# Patient Record
Sex: Male | Born: 1970 | Race: White | Hispanic: No | Smoking: Current every day smoker
Health system: Southern US, Community
[De-identification: ages and names within clinical notes are randomized; demographics above are authoritative.]

## PROBLEM LIST (undated history)

## (undated) DIAGNOSIS — I219 Acute myocardial infarction, unspecified: Secondary | ICD-10-CM

## (undated) DIAGNOSIS — I639 Cerebral infarction, unspecified: Secondary | ICD-10-CM

## (undated) DIAGNOSIS — I2510 Atherosclerotic heart disease of native coronary artery without angina pectoris: Secondary | ICD-10-CM

## (undated) DIAGNOSIS — Z91199 Patient's noncompliance with other medical treatment and regimen due to unspecified reason: Secondary | ICD-10-CM

## (undated) DIAGNOSIS — I6529 Occlusion and stenosis of unspecified carotid artery: Secondary | ICD-10-CM

## (undated) DIAGNOSIS — T884XXA Failed or difficult intubation, initial encounter: Secondary | ICD-10-CM

## (undated) DIAGNOSIS — I10 Essential (primary) hypertension: Secondary | ICD-10-CM

## (undated) DIAGNOSIS — E785 Hyperlipidemia, unspecified: Secondary | ICD-10-CM

## (undated) DIAGNOSIS — Z720 Tobacco use: Secondary | ICD-10-CM

## (undated) DIAGNOSIS — Z9119 Patient's noncompliance with other medical treatment and regimen: Secondary | ICD-10-CM

## (undated) HISTORY — DX: Essential (primary) hypertension: I10

## (undated) HISTORY — DX: Patient's noncompliance with other medical treatment and regimen: Z91.19

## (undated) HISTORY — DX: Tobacco use: Z72.0

## (undated) HISTORY — DX: Acute myocardial infarction, unspecified: I21.9

## (undated) HISTORY — DX: Atherosclerotic heart disease of native coronary artery without angina pectoris: I25.10

## (undated) HISTORY — DX: Patient's noncompliance with other medical treatment and regimen due to unspecified reason: Z91.199

## (undated) HISTORY — DX: Hyperlipidemia, unspecified: E78.5

## (undated) HISTORY — PX: CORONARY ANGIOPLASTY WITH STENT PLACEMENT: SHX49

## (undated) HISTORY — DX: Failed or difficult intubation, initial encounter: T88.4XXA

## (undated) HISTORY — DX: Cerebral infarction, unspecified: I63.9

## (undated) HISTORY — DX: Occlusion and stenosis of unspecified carotid artery: I65.29

---

## 2002-01-31 ENCOUNTER — Emergency Department (HOSPITAL_COMMUNITY): Admission: EM | Admit: 2002-01-31 | Discharge: 2002-01-31 | Payer: Self-pay | Admitting: Emergency Medicine

## 2002-03-03 ENCOUNTER — Emergency Department (HOSPITAL_COMMUNITY): Admission: EM | Admit: 2002-03-03 | Discharge: 2002-03-03 | Payer: Self-pay | Admitting: Emergency Medicine

## 2003-03-03 ENCOUNTER — Emergency Department (HOSPITAL_COMMUNITY): Admission: EM | Admit: 2003-03-03 | Discharge: 2003-03-03 | Payer: Self-pay | Admitting: *Deleted

## 2003-11-02 ENCOUNTER — Emergency Department (HOSPITAL_COMMUNITY): Admission: EM | Admit: 2003-11-02 | Discharge: 2003-11-02 | Payer: Self-pay | Admitting: Emergency Medicine

## 2004-12-05 ENCOUNTER — Emergency Department (HOSPITAL_COMMUNITY): Admission: EM | Admit: 2004-12-05 | Discharge: 2004-12-05 | Payer: Self-pay | Admitting: *Deleted

## 2005-01-20 ENCOUNTER — Encounter: Payer: Self-pay | Admitting: *Deleted

## 2005-01-20 ENCOUNTER — Inpatient Hospital Stay (HOSPITAL_COMMUNITY): Admission: EM | Admit: 2005-01-20 | Discharge: 2005-01-22 | Payer: Self-pay | Admitting: Emergency Medicine

## 2005-04-22 ENCOUNTER — Emergency Department (HOSPITAL_COMMUNITY): Admission: EM | Admit: 2005-04-22 | Discharge: 2005-04-23 | Payer: Self-pay | Admitting: *Deleted

## 2005-08-16 ENCOUNTER — Encounter: Payer: Self-pay | Admitting: Emergency Medicine

## 2005-08-16 ENCOUNTER — Inpatient Hospital Stay (HOSPITAL_COMMUNITY): Admission: EM | Admit: 2005-08-16 | Discharge: 2005-08-17 | Payer: Self-pay | Admitting: Emergency Medicine

## 2005-09-07 ENCOUNTER — Emergency Department (HOSPITAL_COMMUNITY): Admission: EM | Admit: 2005-09-07 | Discharge: 2005-09-07 | Payer: Self-pay | Admitting: Emergency Medicine

## 2005-10-15 ENCOUNTER — Emergency Department (HOSPITAL_COMMUNITY): Admission: EM | Admit: 2005-10-15 | Discharge: 2005-10-15 | Payer: Self-pay | Admitting: Emergency Medicine

## 2005-12-20 ENCOUNTER — Emergency Department (HOSPITAL_COMMUNITY): Admission: EM | Admit: 2005-12-20 | Discharge: 2005-12-20 | Payer: Self-pay | Admitting: Emergency Medicine

## 2006-04-02 ENCOUNTER — Encounter: Payer: Self-pay | Admitting: Emergency Medicine

## 2006-04-02 ENCOUNTER — Inpatient Hospital Stay (HOSPITAL_COMMUNITY): Admission: EM | Admit: 2006-04-02 | Discharge: 2006-04-04 | Payer: Self-pay | Admitting: Cardiology

## 2008-01-06 ENCOUNTER — Emergency Department (HOSPITAL_COMMUNITY): Admission: EM | Admit: 2008-01-06 | Discharge: 2008-01-06 | Payer: Self-pay | Admitting: Emergency Medicine

## 2010-02-24 ENCOUNTER — Inpatient Hospital Stay (HOSPITAL_COMMUNITY): Admission: EM | Admit: 2010-02-24 | Discharge: 2010-02-25 | Payer: Self-pay | Admitting: Cardiovascular Disease

## 2010-02-24 ENCOUNTER — Encounter: Payer: Self-pay | Admitting: Emergency Medicine

## 2010-03-11 ENCOUNTER — Encounter (HOSPITAL_COMMUNITY): Admission: RE | Admit: 2010-03-11 | Discharge: 2010-03-11 | Payer: Self-pay | Admitting: Cardiology

## 2011-01-10 LAB — CBC
HCT: 48.3 % (ref 39.0–52.0)
HCT: 43.1 % (ref 39.0–52.0)
Hemoglobin: 17.3 g/dL — ABNORMAL HIGH (ref 13.0–17.0)
Hemoglobin: 15 g/dL (ref 13.0–17.0)
MCHC: 34.8 g/dL (ref 30.0–36.0)
Platelets: 165 10*3/uL (ref 150–400)
Platelets: 130 10*3/uL — ABNORMAL LOW (ref 150–400)
RBC: 5.3 MIL/uL (ref 4.22–5.81)
RDW: 13.4 % (ref 11.5–15.5)
RDW: 13.4 % (ref 11.5–15.5)
WBC: 10.6 10*3/uL — ABNORMAL HIGH (ref 4.0–10.5)
WBC: 7.9 10*3/uL (ref 4.0–10.5)

## 2011-01-10 LAB — HEPATIC FUNCTION PANEL: Total Protein: 6.2 g/dL (ref 6.0–8.3)

## 2011-01-10 LAB — CARDIAC PANEL(CRET KIN+CKTOT+MB+TROPI)
CK, MB: 1.2 ng/mL (ref 0.3–4.0)
CK, MB: 1.3 ng/mL (ref 0.3–4.0)
CK, MB: 1.6 ng/mL (ref 0.3–4.0)
Relative Index: INVALID (ref 0.0–2.5)
Relative Index: INVALID (ref 0.0–2.5)
Total CK: 84 U/L (ref 7–232)
Total CK: 95 U/L (ref 7–232)
Troponin I: 0.13 ng/mL — ABNORMAL HIGH (ref 0.00–0.06)

## 2011-01-10 LAB — BASIC METABOLIC PANEL
BUN: 9 mg/dL (ref 6–23)
CO2: 25 mEq/L (ref 19–32)
CO2: 24 mEq/L (ref 19–32)
Calcium: 9.8 mg/dL (ref 8.4–10.5)
Calcium: 8.8 mg/dL (ref 8.4–10.5)
Chloride: 110 mEq/L (ref 96–112)
Creatinine, Ser: 0.96 mg/dL (ref 0.4–1.5)
GFR calc Af Amer: >60 mL/min (ref >60)
GFR calc Af Amer: >60 mL/min (ref >60)
GFR calc non Af Amer: >60 mL/min (ref >60)
GFR calc non Af Amer: >60 mL/min (ref >60)
Glucose, Bld: 92 mg/dL (ref 70–99)
Potassium: 4.1 mEq/L (ref 3.5–5.1)
Sodium: 140 mEq/L (ref 135–145)

## 2011-01-10 LAB — LIPID PANEL
HDL: 25 mg/dL — ABNORMAL LOW (ref >39)
VLDL: 44 mg/dL — ABNORMAL HIGH (ref 0–40)

## 2011-01-10 LAB — HEMOGLOBIN A1C
Hgb A1c MFr Bld: 5.3 % (ref <5.7)
Mean Plasma Glucose: 105 mg/dL (ref <117)

## 2011-01-10 LAB — PROTIME-INR: Prothrombin Time: 13.9 seconds (ref 11.6–15.2)

## 2011-01-10 LAB — POCT CARDIAC MARKERS
Myoglobin, poc: 62.3 ng/mL (ref 12–200)
Troponin i, poc: 0.09 ng/mL (ref 0.00–0.09)
Troponin i, poc: 0.09 ng/mL (ref 0.00–0.09)

## 2011-01-10 LAB — DIFFERENTIAL
Basophils Absolute: 0.2 10*3/uL — ABNORMAL HIGH (ref 0.0–0.1)
Basophils Relative: 2 % — ABNORMAL HIGH (ref 0–1)
Eosinophils Absolute: 0.3 10*3/uL (ref 0.0–0.7)
Eosinophils Relative: 3 % (ref 0–5)
Monocytes Absolute: 0.7 10*3/uL (ref 0.1–1.0)
Neutro Abs: 4.6 10*3/uL (ref 1.7–7.7)

## 2011-01-10 LAB — MRSA PCR SCREENING: MRSA by PCR: NEGATIVE

## 2011-01-10 LAB — APTT: aPTT: 44 seconds — ABNORMAL HIGH (ref 24–37)

## 2011-01-10 LAB — TSH: TSH: 2.237 u[IU]/mL (ref 0.350–4.500)

## 2011-03-10 NOTE — Discharge Summary (Signed)
Brandon [name], Brandon [name]               ACCOUNT NO.:  [id number]   MEDICAL RECORD NO.:  [id number]          PATIENT TYPE:  INP   LOCATION:  6531                         FACILITY:  [facility]   PHYSICIAN:  Cristy Hilts. Brandon Halim, MD       DATE OF BIRTH:  07-Sep-1971   DATE OF ADMISSION:  04/02/2006  DATE OF DISCHARGE:  04/04/2006                                 DISCHARGE SUMMARY   DISCHARGE DIAGNOSES:  1.  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  2.  Right coronary artery intervention with stenting in March of 2006.  3.  Dyslipidemia.  4.  History of smoking.   HOSPITAL COURSE:  The patient is a 40 year old male followed by Dr. Jacinto [name]  with a history of coronary disease.  He had an inferior MI in March of 2006  treated with RCA stenting.  In October of  2006, he was restudied when he  presented with recurrent chest pain; he had run out of his Plavix.  Catheterization at that time revealed no restenosis.  He was admitted via  [name] on April 02, 2006 after he presented there with chest pain  consistent with unstable angina.  The patient unfortunately continues to  smoke a pack and a half of cigarettes a day.  I am not sure he has been  compliant with his medicines, as he requested prescriptions for all of his  medicines at discharge, but says he was taking them at home.  The patient  was admitted and started on IV heparin and nitrates and set up for  diagnostic catheterization.  This was done on April 03, 2006.  He had a 75%  in-stent restenosis to the RCA that was treated with a cutting balloon.  There was 40 to 50% mid LAD, 50% diagonal and 80% distal circumflex that was  a small vessel.  LV function was normal.  The patient's troponins were  negative.  He tolerated the procedure well.  During the early morning hours  of 5:00 a.m. on the April 04, 2006, he had a 2.7 second pause while on  telemetry.  He denies any syncopal  history or dizziness.  We feel it he can  be discharged later on April 04, 2006.  Will continue his current  medications.  We did add Zetia 10 mg, as his LDL was 126.   DISCHARGE MEDICATIONS:  1.  Lipitor 80 mg a day.  2.  Zetia 10 mg a day.  3.  Plavix 75 mg a day.  4.  Coated aspirin daily.  5.  Lisinopril 10 mg a day.  6.  Imdur 30 mg a day.  7.  Metoprolol 25 mg twice a day.  8.  Nitroglycerin sublingual p.r.n.   LABORATORY DATA:  White count 9.4, hemoglobin 15.5, hematocrit 43.8,  platelets 180.  Sodium 140, potassium 4.1, BUN 12, creatinine 1.0.  LFTs  were normal.  His troponins were negative.  BNP is less than 30.  INR is  1.0.   CHEST X-RAY:  Stable with a prominent right heart border.   ELECTROCARDIOGRAM:  Sinus rhythm and nonspecific ST changes.  He does have  small inferior Qs.   DISPOSITION:  The patient is discharged in stable condition.  He will be  able to return to work on Monday.  I gave him new prescriptions for all his  medications.  Will continue him on his current dose of beta blocker for now.      Abelino Derrick, P.A.      Cristy Hilts. Brandon Halim, MD  Electronically Signed    LKK/MEDQ  D:  04/04/2006  T:  04/04/2006  Job:  [id number]

## 2011-03-10 NOTE — Cardiovascular Report (Signed)
NAMEPARMVIR, BOOMER NO.:  [id number]   MEDICAL RECORD NO.:  [id number]          PATIENT TYPE:  OBV   LOCATION:  2807                         FACILITY:  [facility]   PHYSICIAN:  Nicki Guadalajara, M.D.     DATE OF BIRTH:  1970-11-02   DATE OF PROCEDURE:  DATE OF DISCHARGE:                              CARDIAC CATHETERIZATION   Cardiac catheterization and percutaneous coronary intervention.   INDICATIONS:  [name] is a 40 year old male, with known coronary  artery disease.  In March 2006, he presented with unstable angina and non-ST-  segment elevation MI.  Catheterization revealed high-grade stenoses in his  mid right coronary artery, for which he underwent stenting with a 3.0 x 33-  mm drug-eluting Cypher stent.  He also did have concomitant CAD in the  distal circumflex as well as scattered 43% lesions in his LAD.  He initially  did well.  In October 2006, repeat catheterization was performed, which  showed a widely patent stent.  At that time, he was again noted to have a  90% stenosis in the distal aspect of a small PLA branch arising from the  circumflex vessel not felt to be amenable to intervention due to small  caliber.  His RCA stent was widely patent.  He again had no significant  change in his LAD lesions.  Apparently, the patient had been doing well.  He  does have a history of continued tobacco use.  He presented to [facility] in transfer from [facility] on April 02, 2006, after  experiencing increasing recurrent symptomatology of similar chest pain that  he had experienced in March 2006.  CK enzymes were negative.  Definitive  catheterization was recommended.   PROCEDURE:  After premedication with Versed 2 mg intravenous, the patient  was prepped and draped in the usual fashion.  His right femoral artery was  punctured anteriorly and a 5-French sheath was inserted.  Diagnostic  catheterization was done utilizing a 5-French  Judkins 4 left and right  coronary catheters.  IC nitroglycerin was administered down the RCA due to  proximal spasm and also to make certain that there was no significant spasm  associated with the lesion.  The pigtail catheter was used for biplane  cinearteriography .  At this time, I broke scrub and reviewed  cineangiograms.  It became apparent that there was no change in the  previously noted 80% circumflex stenosis and the LAD lesions did not appear  to be significantly changed.  However, he did appear to have an eccentric  narrowing within the distal third of the long stented segment in the mid  RCA.  After discussion with the patient, the decision was made to attempt  percutaneous coronary intervention.  Sheath was upgraded to a 6-French  system.  Additional 1 mg Versed was administered.  Angiomax was used for  anticoagulation.  The patient had been on Plavix and received an initial 150  mg orally during this procedure.  A 6-French FR-4 guide with side holes was  used for the interventional procedure.  A __________  wire was advanced down  the RCA.  ACT was therapeutic.  A 3.25 x 20 mm cutting balloon catheter was  used and multiple cuts and dilatations were made within the, tented segment.  The patient received additional IC nitroglycerin.  Cineangiography confirmed  an excellent angiographic result within the entire stented segment of the  RCA.  The patient felt well.  With balloon inflation, he did experience his  chest pressure which responded to balloon deflation.  He tolerated the  procedure well.   HEMODYNAMIC DATA:  Central aortic pressure was 105/73, left ventricular  pressure was 105/13.   ANGIOGRAPHIC DATA:  Left main coronary artery was angiographically normal  and bifurcated into LAD and left circumflex system.   The LAD gave rise to proximal diagonal vessel that has 40% proximal 50% mid  narrowing.  The LAD after the diagonal vessel had 40% proximal stenosis and   40-50% mid stenoses.   The circumflex vessel had smooth 20% narrowing proximally.  The distal  circumflex gave rise to a small posterolateral branch and at the beginning  of this posterolateral branch, there was diffuse narrowing of 80%.  This was  not significantly changed from previously  and the caliber of vessel was  approximately 1.5 mm.   The right coronary was a moderate size vessel that had a large, long, 3.0 x  33-mm Cypher stent inserted proximally extending to the mid segment before  the crux.  There was some mild spasm proximal to the stented segment which  responded to IC nitroglycerin.  There was an eccentric 70-75% stenosis in  the distal third of the stent in the region of a small anterior RV marginal  takeoff.  The ostium of the anterior RV marginal vessel was jailed and had  80-90% narrowing.  The RCA beyond the stented segment gave rise to an acute  marginal branch, PDA-like vessel that 30-40% narrowing.  There was mild  luminal irregularity of the RCA beyond the crux and then this vessel ended  into two branches to PDA and posterolateral type of branch.   Biplane selective angiography revealed normal LV contractility without focal  segmental wall motion abnormality.   Following cutting balloon arthrotomy within the stented segment in the right  coronary artery, the entire stented segment was reduced to 0%.  There was  TIMI III flow.  There was no evidence for dissection.   IMPRESSION:  1.  Normal left ventricular function.  2.  Multivessel coronary obstructive disease with 40 and 50% lesions in the      first diagonal branch of the left anterior descending, 40 to 50% lesions      in the proximal left anterior descending beyond the diagonal and mid-      left anterior descending segment;  20% narrowing in the proximal      circumflex with old 80% distal circumflex stenosis prior to a very small     PLA takeoff in a 1.5 mm caliber vessel; and  focal 75% in stent       restenosis in the distal third aspect of the previously placed 3.0 x 33-      mm Cypher stent with 80-90% narrowing in a jailed ostial of small      anterior RV marginal branch and luminal irregularity in the distal right      coronary artery with 30-40% narrowing in the acute marginal PDA-like      vessel.  3.  Successful cutting balloon arthrotomy within the right coronary  artery      stent with the 75% stenosis being reduced to 0%.  4.  Angiomax for anticoagulation with oral Plavix.           ______________________________  Nicki Guadalajara, M.D.     TK/MEDQ  D:  04/03/2006  T:  04/03/2006  Job:  [id number]   cc:   Sherren Kerns, M.D.

## 2011-03-10 NOTE — Cardiovascular Report (Signed)
[name], Brandon               ACCOUNT NO.:  [id number]   MEDICAL RECORD NO.:  [id number]          PATIENT TYPE:  INP   LOCATION:  2907                         FACILITY:  [facility]   PHYSICIAN:  Cristy Hilts. Jacinto Halim, MD       DATE OF BIRTH:  July 09, 1971   DATE OF PROCEDURE:  01/20/2005  DATE OF DISCHARGE:                              CARDIAC CATHETERIZATION   PROCEDURE PERFORMED:  1.  Left ventriculography.  2.  Selective right and left coronary arteriography.  3.  Ascending aortogram.  4.  Right percutaneous transluminal coronary angioplasty and stenting of      right coronary artery.  5.  Right femoral angiography and closure of right femoral artery access      with AngioSeal.   INDICATION:  Brandon [name] is a 40 year old Caucasian male with no  similar prior cardiac history, strong family history of premature coronary  artery disease, morbid obesity and smoking.  He has been having recurrent  chest discomfort over the last 2 weeks.  He presented with unstable angina  to the emergency room and had an abnormal EKG in the form of T-wave  inversions in the inferior leads and also lateral leads.  Given this, he was  brought to the cardiac catheterization lab to evaluate his coronary anatomy.   HEMODYNAMIC DATA:  The left ventricular pressures were 111/28, with an end-  diastolic pressure of 26 mmHg. The aortic pressure of 105/74 with a mean of  89 mmHg. There was no pressure gradient across the aortic valve.   ANGIOGRAPHIC DATA:  LEFT VENTRICLE:  The left ventricular systolic function  was normal. Ejection fraction was estimated at 60%. There was no significant  mitral regurgitation.  RIGHT CORONARY ARTERY:  The right coronary artery is a large dominant  vessel. It has severe ratty midsegment 70-90% stenosis.  This stenosis is a  very long segment.  The PLA and PDA has high bifurcation.  The PLA has a 30-  40% luminal irregularity.  The RCA has a high superior takeoff.  LEFT MAIN:   The left main is a large caliber vessel.  It is normal.  CIRCUMFLEX:  The circumflex is a moderate-to-large caliber vessel.  However,  it has diffuse disease in its distal segment.  The lesion is at least about  70-80%.  RAMUS INTERMEDIATE:  Ramus intermediate is a large caliber vessel.  It has  mild luminal irregularity.  LEFT ANTERIOR DESCENDING:  Left anterior descending vessel is a large  caliber vessel.  Gives rise to a small diagonal one.  Midsegment has a 30%  or at most 40% stenosis, and mid-to-distal segment also has a 40-50%  stenosis.  There was kinking noted in the midsegment.  The LAD wraps around  the apex.   ASCENDING AORTOGRAM:  Ascending aortogram reveals presence of aortic valve  cusps.  The origin of RCA was superior.   IMPRESSION:  1.  Ratty 70-90% long segment right coronary artery stenosis in the mid      segment with slow filling noted in the right coronary artery.  Circumflex has diffuse distal disease and a small vessel distally.      Proximally it is a large vessel.  Distally there is 70-80% luminal      irregularity.  2.  Ramus intermedius is a large caliber vessel with mild luminal      irregularity.  Left anterior descending is a large-caliber vessel and      has a mid-40% and a mid-to-distal 40-50% stenosis.   INTERVENTION DATA:  Successful PTCA and stenting of the  mid-RCA with a 2.0  x 33 mm CYPHER deployed at 18 atmospheres of pressure  The stenosis was  reduced from 90% to 0% with TIMI 3 flow maintained at the end of the  procedure.  There was brisk flow noted at the end of the procedure.   RECOMMENDATIONS:  1.  The patient will be observed for 24-48 hours.  A non-Q wave myocardial      infarction will be ruled out by serial enzymes.  2.  A very aggressive risk factor modification is indicated especially given      his strong family history, smoking cessation, exercise with weight loss,      HDL goal greater than 40, LDL goal less than 70 is  indicated.   A total of 250 mL of contrast was utilized for diagnostic and interventional  procedure.   TECHNIQUES OF PROCEDURE:  Under the usual sterile precautions, using an 6-  French right femoral artery access, a 6-French multipurpose B2catheter was  advanced into the ascending aorta over a 0.035 J-wire.  The catheter was  then advanced into the left ventricle and left ventricular pressures were  monitored.  Hand contrast injection of the left ventricle was performed both  in LAO and RAO positions.  The catheter was flushed with saline and pulled  back into the ascending aorta and pressure gradient across the aortic valve  was monitored.  The right coronary artery was selectively engaged and  angiography was performed.  Prior to doing this, an ascending aortogram was  performed in the LAO projection.  Then the 200 mcg of intracoronary  nitroglycerin was also administered and angiography was repeated.  Then the  catheter was utilized to engage the left main coronary artery and  angiography was repeated.  Then the catheter was pulled out of the body in  the usual fashion.  Then a 6-French Judkins left 4.0 diagnostic catheter was  utilized again to engage the left main coronary artery and angiography was  repeated.  Then the catheter was pulled out of the body in the usual  fashion.   TECHNIQUE OF INTERVENTION:  A 6 French sheath was exchanged for a 7 [place]  sheath.  Then a 7 [place] AR-1 with side-hole guide was utilized to engage  the right coronary artery.  Then 200 mcg of intracoronary nitroglycerin was  readministered.  A 190-cm by 0.014-inch ATW guidewire was utilized to cross  into the RCA and the lesion length was carefully measured.  Prior to this  procedure a total of 6000 units of heparin was administered and the ACT was  maintained at therapeutic range.  A 3.0 x 30 mm Voyager balloon was utilized and balloon angioplasty at 12 atmospheres of pressure was performed.   Because of persistent 50% stenosis in its midsegment and slow filling, a 3.0  x 33 mm CYPHER stent was utilized and the stent was deployed at 18  atmospheres of pressure for a total of 42 seconds.  The balloon was  deflated, pulled back into the guiding catheter; 200 mcg of intracoronary  nitroglycerin was administered.  Angiography was repeated.  Excellent  results were noted.  The guidewire was withdrawn, angiography was repeated.  The guide catheter disengaged and pulled out of body in the usual fashion.  A right femoral angiography was performed through the arterial access sheath  and the access was closed with 8 French AngioSeal.  Excellent hemostasis was  obtained.  The patient tolerated the procedure well.  No immediate  complications noted.      JRG/MEDQ  D:  01/20/2005  T:  01/20/2005  Job:  [id number]   cc:   Kirk Ruths, M.D.  [address]  [place]  [place] [address]  Fax: [phone]   Cristy Hilts. Jacinto Halim, MD  [address]  [place]  [place] [address]  Fax: [phone]

## 2011-03-10 NOTE — Cardiovascular Report (Signed)
[name], [name]               ACCOUNT NO.:  [id number]   MEDICAL RECORD NO.:  [id number]          PATIENT TYPE:  INP   LOCATION:  6533                         FACILITY:  [facility]   PHYSICIAN:  Madaline Savage, M.D.DATE OF BIRTH:  Aug 15, 1971   DATE OF PROCEDURE:  08/16/2005  DATE OF DISCHARGE:                              CARDIAC CATHETERIZATION   PROCEDURES PERFORMED:  1.  Selective coronary angiography by Judkins technique.  2.  Retrograde left heart catheterization.  3.  Left ventricular angiography.   COMPLICATIONS:  None.   ENTRY SITE:  Right femoral.   DYE USED:  Omnipaque.   PATIENT PROFILE:  Brandon [name] is a 40 year old married white gentleman who  has known coronary disease who underwent coronary artery stent placement Dr.  Jeanella Cara on January 20, 2005 and a 3.0 x 30 mm Cypher stent was placed into  the mid-right coronary artery with excellent results. The patient ran out of  Plavix a month ago and was unable to afford it. He presented to the [facility] ER today with an episode of chest pain with negative cardiac enzymes  and a normal EKG. He was brought to the cath lab to reevaluate his chest  pain symptoms in view of his drug-eluting stent in the mid RCA without the  protection of Plavix for the last one month. This case went well and no  complications occurred.   RESULTS:   PRESSURES:  Left ventricular pressure was 110/12, end-diastolic pressure 20.  Central aortic pressure 105/75, mean of 90.   ANGIOGRAPHIC RESULTS:  The patient's left main coronary artery was patent.  The left anterior descending coronary artery coursed to the cardiac apex  giving rise to one major diagonal branch. There was a 50% stenosis in the  LAD just beyond the septal perforator branch. There was a second area of 30%  stenosis beyond second septal perforator branch.   Bifurcating diagonal branch was essentially normal.   Circumflex coronary artery was nondominant. The proximal and  midportion of  the vessels were normal. There was a distal posterolateral branch arising  before a second atrial circumflex branch which was 90% stenosed proximally.  This vessel was 1.5 mm in diameter and was felt to be not amenable to  percutaneous intervention.   Right coronary artery was difficult to engage but was entered with a  Williams right configuration Cordis catheter. This stent was widely patent.  The RCA showed no new lesions and TIMI III flow was class III.   Left ventricular angiography showed excellent LV contractility. No  significant wall motion abnormalities. EF of 60% without mitral  regurgitation. No LV thrombus.   FINAL DIAGNOSIS:  1.  Patent right coronary artery stent.  2.  Distal circumflex stenosis and in the proximal portion of a      posterolateral branch 90% and a 1.5 mm vessel best treated medically.  3.  A 50% mid and 30-40% distal left anterior descending artery stenoses to      be treated medically.  4.  Normal left ventricular systolic function.   PLAN:  The patient will  be treated with aspirin, Plavix, isosorbide  mononitrate, beta blockade and ACE inhibitors.           ______________________________  Madaline Savage, M.D.     WHG/MEDQ  D:  08/16/2005  T:  08/16/2005  Job:  [id number]   cc:   Cristy Hilts. Jacinto Halim, MD  Fax: [phone]   [facility]

## 2011-03-10 NOTE — Discharge Summary (Signed)
NAMESEGER, Brandon [name]               ACCOUNT NO.:  [id number]   MEDICAL RECORD NO.:  [id number]          PATIENT TYPE:  INP   LOCATION:  4715                         FACILITY:  [facility]   PHYSICIAN:  Cristy Hilts. Jacinto Halim, MD       DATE OF BIRTH:  08/18/1971   DATE OF ADMISSION:  01/20/2005  DATE OF DISCHARGE:  01/22/2005                                 DISCHARGE SUMMARY   [name] is a 41 year old male who was admitted with an acute coronary  syndrome.  He urgently went to the cardiac catheterization.  He was on IV  heparin and IV Integrilin.  He underwent cardiac catheterization by Dr. Yates Decamp showing a 80-90% stenosis of the mid segment with slow filling of his  RCA.  His circumflex had diffuse distal disease, small vessels distally 70-  80%, ramus intermedius had mild disease, LAD had a mid 40% lesion, in the  mid to distal 40-50% he had a small diagonal 1.  He underwent PCI and  stenting with a 3.0 x 33 CYPHER stent, reduced from 90% to 0% of his RCA.  His enzymes were elevated.  Post procedure he did receive Integrilin.  He  was seen by the smoking cessation nurse.  He was given a nicotine patch.  His total cholesterol was 249, triglycerides 175, HDL was 36 and LDL was  188.  He was put on Lipitor on admission.  He was seen by cardiac rehab.  He  was referred to cardiac rehab phase 2 but he did not want to participate.  On January 22, 2005, he was seen by Dr. Nanetta Batty.  He was considered  stable to be discharged home.  His blood pressure was 107/85, pulse was 97,  respirations were 20, temperature was 97.7.  His EKG on January 21, 2005,  showed resolved anterior T-wave inversions but he continued to have inferior  T-wave inversions and a Q-wave in 3.   LABS:  Hemoglobin 14.2, hematocrit 40.8, WBCs 9.1, platelets 207.  Sodium  139, potassium 3.5, BUN 9, creatinine 0.9 and glucose 108.  Lipid profile:  Total cholesterol 249, triglycerides 188, HDL was 36 and LDL was 175.  TSH  was 2.881,  AST was 26, ALT was 36, albumin was 3.5, total protein was 6.2.  CK MB #1 was 160/3.3, troponin 1.53; #2 150/39.9, troponin of 1.82.  On  January 21, 2005, the CK was 161, MB 9.3 with a troponin of 1.76.  On January 22, 2005, CK MB 112/4.6 with a troponin of 1.0.   His chest x-ray did show a persistent density at the right base medially.  He had two portable chests and was recommended to have a PA & lateral.  Thus, at the time of discharge he was sent down for a PA & lateral and  further recommendations will be made following his chest x-ray.  He will be  sent home pending the chest x-ray results.   MEDICATIONS:  1.  Plavix 75 mg one tab per day.  He is not to stop it, he must take it for  at last 6 months.  2.  Aspirin 81 mg one tab per day.  3.  Toprol XL 50 mg one tab per day.  4.  Lipitor 80 mg one tab per day.  5.  Protonix 40 mg one tab per day.  6.  He should use nicotine patch 14 mg every 24 hours as needed for smoking      cessation.  7.  Nitroglycerin as needed p.r.n. for chest pain.  8.  He can take Ativan 0.5 mg and take one twice per day as needed for his      anxiety related to quitting smoking.   He is to do no strenuous activity.  He is not to return to work until seen  by Dr. Jacinto Halim in the office.  He should be on a low saturated fat diet, no  transfatty acids.  If he has any problems with his groin he will give [place] a  call.  He should quit smoking.  He will followup with Dr. Jacinto Halim in 1-2  weeks.   DISCHARGE DIAGNOSES:  1.  Inferior myocardial infarction.  2.  Normal ejection fraction.  3.  Positive residual disease in his circumflex and left anterior      descending.  4.  Tobacco smoking.  5.  Premature family history of heart disease.  6.  Abnormal chest x-ray, having a PA & lateral prior to his discharge.  7.  Hyperlipidemia, now on Lipitor 80.      BB/MEDQ  D:  01/22/2005  T:  01/22/2005  Job:  [id number]

## 2011-03-10 NOTE — Discharge Summary (Signed)
NAMEELISA, Brandon [name]               ACCOUNT NO.:  [id number]   MEDICAL RECORD NO.:  [id number]          PATIENT TYPE:  INP   LOCATION:  6533                         FACILITY:  [facility]   PHYSICIAN:  Cristy Hilts. Jacinto Halim, MD       DATE OF BIRTH:  07/05/1971   DATE OF ADMISSION:  08/16/2005  DATE OF DISCHARGE:  08/17/2005                                 DISCHARGE SUMMARY   DISCHARGE DIAGNOSES:  1.  Chest pain consistent with unstable angina, patent right coronary artery      CYPHER stent at catheterization this admission.  2.  Coronary disease with residual distal circumflex disease.  3.  Right coronary artery CYPHER stenting, March 2006.  4.  Dyslipidemia.  5.  History of smoking.  6.  Noncompliance.  7.  Strong family history of coronary disease.   HOSPITAL COURSE:  The patient is a 40 year old male who initially presented  in March 2006 with an unstable angina and had an RCA CYPHER stent placed.  He had some distal circumflex disease also.  He says he took his medicines  for about a month and then the prescriptions ran out and he stopped.  He was  admitted on August 16, 2005 with chest pain for about 1 week.  Symptoms  were consistent with unstable angina.  Please see admission history and  physical for complete details.  The patient was admitted to Telemetry,  started on heparin and nitroglycerin, and set up for diagnostic  catheterization.  Enzymes were negative.  His catheterization revealed a  patent RCA stent site.  He has a 90% distal circumflex.  He has a 50% LAD  prior to the second diagonal.  His normal LV function.  Plan is for  continued medical therapy.  We feel he can be discharged August 17, 2005.   DISCHARGED MEDICATIONS:  1.  Coated aspirin once a day.  2.  Plavix 75 mg a day.  3.  Isosorbide 60 mg a day.  4.  Metoprolol 25 mg twice daily.  5.  Lisinopril 10 mg a day.  6.  Lipitor 80 mg a day.  7.  Prilosec 20 mg a day.  8.  Nitroglycerin sublingual p.r.n.   LABORATORY DATA:  White count 11.4, hemoglobin 15.4, hematocrit 44.6,  platelets 173,000.  Sodium 138, potassium 4.3, BUN 12, creatinine 0.8.  LFTs  were normal.  Troponin is negative x1.  TSH 1.14.  D-dimer is 0.22.  BNP is  less than 30.   Chest x-ray shows no active disease.   INR is 1.0.   DISPOSITION:  Patient is discharged in stable condition and will follow up  Dr. Jacinto Halim, August 29, 2005 at 10:45.   COMMENT:  It should be noted he did have an EKG in the emergency room that  showed normal sinus rhythm with a small inferior Q's.      Abelino Derrick, P.A.      Cristy Hilts. Jacinto Halim, MD  Electronically Signed    LKK/MEDQ  D:  08/17/2005  T:  08/17/2005  Job:  [id number]

## 2011-03-10 NOTE — H&P (Signed)
NAMEJES, COSTALES NO.:  [id number]   MEDICAL RECORD NO.:  [id number]          PATIENT TYPE:  INP   LOCATION:  4715                         FACILITY:  [facility]   PHYSICIAN:  Lezlie Octave, N.P.     DATE OF BIRTH:  10-06-1971   DATE OF ADMISSION:  01/20/2005  DATE OF DISCHARGE:                                HISTORY & PHYSICAL   Mr. Broadfoot is a 40 year old white divorced male patient who went to [facility] with chest pain. He was then transferred to [facility] for further evaluation. He apparently was having chest pain on and  off for a couple of weeks. It was initially exertion. The last two to three  day he had chest pain at rest with radiation to his left arm, associated  with increasing shortness of breath. His EKG at [facility] showed inferior  and anterior T-wave inversions. He was put on IV Integrilin and heparin. He  was seen in the emergency room by Dr. Jacinto Halim.   PRIMARY MEDICAL HISTORY:  History of hyperlipidemia, but no treatment. He  said in the past it had been in the 500 range.  Borderline hypertension.  Positive premature family history heart disease. Positive tobacco use of  greater than 20 years at two packs per day. No diabetes. No other chronic  illnesses. No surgeries.   FAMILY HISTORY:  Father died at age 28 of a MI. He has no brothers and no  sister. His mother died at age 74.  His paternal had uterine and cervical  cancer and had an MI in her 40s.   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 He is  divorced and has two children. He smokes one and a half to two packs per day  for greater than 20 years. He occasionally drinks alcohol. He does not do  any drugs.   MEDICATIONS:  None.   ALLERGIES:  NKDA.   REVIEW OF SYSTEMS:  He has had a three week history of chest tightness  progressively worse with shortness of breath. He has occasional indigestion.  He takes Prilosec. He has no black tarry  stools. No lower extremity edema.  No presyncope or syncope. No unilateral weakness. No recent cold, cough, or  fever. He has frequent bronchitis, but not recently. He has no  claudications. No tachypalpitations. No mental status changes. All other  systems are negative.   Chest x-ray in the emergency room showed questionable right middle lobe  infiltrate, pneumonia. The radiologist suggested a PA and lateral chest x-  ray when able.   LABORATORY DATA:  His INR is 0.9, sodium 138, potassium 3.6, BUN 15,  creatinine 1.2, glucose 115. His hemoglobin is 15.9, hematocrit 44.2,  platelet count 223,000, WBC 10.4.  His BNP was 47.2. His marker times 1 was  78.3/2.9 and his troponin was 0.98.   PHYSICAL EXAMINATION:  VITAL SIGNS: His blood pressure is 125/78, he is  afebrile. His heart rate is 75. His respirations 20 and heart rate regular.  GENERAL: He appears in no acute distress.  NECK: Without any thyromegaly.  RESPIRATORY: Clear to auscultation bilaterally.  CARDIAC: Heart sounds regular. S1 and S2 present.  No murmur or gallop  noted. He has 2+ carotids with no bruits, 2+ femorals, and good dorsalis  pedis pulses.  ABDOMEN: Benign. Bowel sounds present times four. No masses or tenderness.  No hepatomegaly.  NEUROLOGIC: Alert and oriented times three.  MUSCULOSKELETAL:  Moves all four extremities times four.   ASSESSMENT:  1.  Acute coronary syndrome. Positive troponins and EKG changes.  2.  Prior history of hyperlipidemia.  3.  Family history of premature heart disease.  4.  Tobacco use.   PLAN:  He was seen and evaluated by Dr. Yates Decamp who decided to take him to  the cath lab urgently.      BB/MEDQ  D:  01/22/2005  T:  01/22/2005  Job:  [id number]   cc:   Kirk Ruths, M.D.  [address]  [place]  [place] [address]  Fax: [phone]   Cristy Hilts. Jacinto Halim, MD  [address]  [place]  [place] [address]  Fax: [phone]

## 2011-03-10 NOTE — H&P (Signed)
Brandon [name], DANGERFIELD               ACCOUNT NO.:  [id number]   MEDICAL RECORD NO.:  [id number]          PATIENT TYPE:  INP   LOCATION:  2004                         FACILITY:  [facility]   PHYSICIAN:  Cristy Hilts. Jacinto Halim, MD       DATE OF BIRTH:  May 24, 1971   DATE OF ADMISSION:  04/02/2006  DATE OF DISCHARGE:                                HISTORY & PHYSICAL   HISTORY OF PRESENT ILLNESS:  Brandon [name] is a 40 year old white male who  is admitted to [facility] for further evaluation of chest pain.  He  was transferred from [facility].   The patient has a history of coronary artery disease which dates back to  March of 2006.  At that time, he presented with an inferior myocardial  infarction.  He underwent cardiac catheterization, which demonstrated an 80%  to 90% stenosis with the right coronary artery.  He subsequently underwent  stenting of this segment.  Also noted was circumflex, ramus and LAD residual  disease.  He returned in September of 2006 with recurrent chest pain.  Repeat cardiac catheterization demonstrated patency of the right coronary  artery stents.   The patient presented to the emergency department at [facility]  last evening with chest pain.  He noted a sharp discomfort in his right  anterior chest which occurred while he was lying in bed.  The chest pain  appeared not to be related to position, activity, meals or respirations.  There were no exacerbating or ameliorating factors.  There was no associated  dyspnea, diaphoresis, or nausea.  He took 3 nitroglycerin tablets which  ultimately resulted in relief of his chest pain after approximately 1 hour.  He has experienced no further chest pain.  He is free of chest pain and  otherwise asymptomatic at this time.  He is unsure if this chest pain is  similar to that which heralded his acute myocardial infarction.  The patient  did noted intermittent right arm tingling over the last 3 days, but not  associated with chest pain.   The patient continues to smoke 1-1/2 packs of cigarettes per day.  There is  a history of dyslipidemia and hypertension.  There is no history of diabetes  mellitus.  There is a strong family history of early coronary artery disease  (father in his 30s).   PAST MEDICAL HISTORY:  The patient has no other medical problems.   OPERATIONS:  None.   SIGNIFICANT INJURIES:  None.   MEDICATIONS:  Lisinopril, isosorbide mononitrate, Lipitor, Plavix, and  aspirin.   SOCIAL HISTORY:  The patient works as an Product manager.  He lives  with his 2 children and his ex-wife.  He does not use alcohol.  He smokes  cigarettes, as described as above.   REVIEW OF SYSTEMS:  Review of systems reveals no new problems related to his  head, eyes, ears, nose, mouth, throat, lungs, gastrointestinal system,  genitourinary system, or extremities.  There is no history of neurologic or  psychiatric disorder.  There is no history of fever, chills or weight loss.  PHYSICAL EXAMINATION:  VITAL SIGNS:  Blood pressure 122/99.  Pulse 77 and  regular.  Respirations 18.  Temperature 97.4.  GENERAL:  The patient was a young, white man in no discomfort.  He was  alert, oriented, appropriate, and responsive.  HEENT:  Head, eyes, nose, and mouth were normal.  NECK:  The neck was without thyromegaly or adenopathy.  Carotid pulses were  palpable bilaterally and without bruits.  CARDIAC:  Examination revealed a normal S1 and S2.  There was no S3, S4,  murmur, rub, or click.  Cardiac rhythm was regular.  No chest wall  tenderness was noted.  LUNGS:  The lungs were clear.  ABDOMEN:  The abdomen was soft and nontender.  There was no mass,  hepatosplenomegaly, bruit, distention, rebound, guarding, or rigidity.  Bowel sounds were normal.  RECTAL AND GENITAL:  Examinations were not performed as they were not  pertinent to the reason for acute care hospitalization.  EXTREMITIES:  The  extremities were without edema, deviation, or deformity.  Radial and dorsalis pedal pulses were palpable bilaterally.  NEUROLOGIC:  Brief screening neurologic survey was unremarkable.   LABORATORY AND ACCESSORY CLINICAL DATA:  The electrocardiogram was normal.   The chest radiograph report was pending at the time of this dictation.   White count was 12.3 with a hemoglobin of 17.1 and hematocrit of 48.7.  Potassium was 3.4, BUN of 14, and creatinine 0.9.  The initial set of  cardiac markers revealed a myoglobin of 33.0, CK-MB less than 1.0 and  troponin less than 0.08.  The remaining studies were pending at the time of  this dictation.   IMPRESSION:  1.  Chest pain; rule out unstable angina.  2.  Coronary artery disease.  Status post inferior myocardial infarction in      March of 2006, resulting in right coronary artery stenting.  Residual      left anterior descending and circumflex disease was noted.  Repeat      cardiac catheterization in October of 2006 demonstrated a patent stent.  3.  Dyslipidemia.  4.  Hypertension.   PLAN:  1.  Telemetry.  2.  Serial cardiac enzymes.  3.  Aspirin.  4.  Plavix.  5.  Intravenous heparin.  6.  Intravenous nitroglycerin.  7.  Metoprolol.  8.  Discontinuation of smoking discussed with the patient.  9.  Further measures per Dr. Jacinto Halim.      Ulyses Amor, MD   Electronically Signed     ______________________________  Cristy Hilts. Jacinto Halim, MD    MSC/MEDQ  D:  04/02/2006  T:  04/02/2006  Job:  [id number]

## 2011-05-26 ENCOUNTER — Ambulatory Visit: Payer: Self-pay | Admitting: Family Medicine

## 2011-05-29 ENCOUNTER — Encounter: Payer: Self-pay | Admitting: Family Medicine

## 2011-05-30 ENCOUNTER — Ambulatory Visit (INDEPENDENT_AMBULATORY_CARE_PROVIDER_SITE_OTHER): Payer: Medicare Other | Admitting: Family Medicine

## 2011-05-30 ENCOUNTER — Encounter: Payer: Self-pay | Admitting: Family Medicine

## 2011-05-30 VITALS — BP 142/90 | HR 91 | Resp 16 | Ht 69.5 in | Wt 240.0 lb

## 2011-05-30 DIAGNOSIS — Z720 Tobacco use: Secondary | ICD-10-CM

## 2011-05-30 DIAGNOSIS — E669 Obesity, unspecified: Secondary | ICD-10-CM

## 2011-05-30 DIAGNOSIS — E785 Hyperlipidemia, unspecified: Secondary | ICD-10-CM

## 2011-05-30 DIAGNOSIS — I2510 Atherosclerotic heart disease of native coronary artery without angina pectoris: Secondary | ICD-10-CM

## 2011-05-30 DIAGNOSIS — I10 Essential (primary) hypertension: Secondary | ICD-10-CM

## 2011-05-30 DIAGNOSIS — F17200 Nicotine dependence, unspecified, uncomplicated: Secondary | ICD-10-CM

## 2011-05-30 DIAGNOSIS — R21 Rash and other nonspecific skin eruption: Secondary | ICD-10-CM

## 2011-05-30 MED ORDER — METHYLPREDNISOLONE ACETATE 80 MG/ML IJ SUSP
40.0000 mg | Freq: Once | INTRAMUSCULAR | Status: AC
  Administered 2011-05-30: 40 mg via INTRAMUSCULAR

## 2011-05-30 MED ORDER — METOPROLOL SUCCINATE ER 25 MG PO TB24: 25.0000 mg | ORAL_TABLET | Freq: Every day | ORAL | Status: DC

## 2011-05-30 MED ORDER — CEPHALEXIN 500 MG PO CAPS: 500.0000 mg | ORAL_CAPSULE | Freq: Two times a day (BID) | ORAL | Status: AC

## 2011-05-30 NOTE — Progress Notes (Signed)
  Subjective:    Patient ID: Brandon [name], male    DOB: Mar 06, 1971, 40 y.o.   MRN: [id number]  HPI The patient is here to establish care. He has not seen a primary doctor in greater than one year. His extensive cardiovascular history. He is currently on disability status post multiple heart attacks. He is currently not on any medications and he does not see a cardiologist at this time.  Hypertension- history of hypertension, he has not been on medications for rate return one year. He believes he was on lisinopril and metoprolol in the past.  Coronary artery disease- he's had multiple blockages (thinks he has 4 stents) as well as  heart attacks. His last MI was in 2009. He had stress test done in 2011 by [place] [facility] which was normal. He is being dismissed from [place] secondary to inability to make appointments. He would like to be seen by a cardiologist at [facility]  He has transportation now. Per the records he had RCA stenting done in March of 2006 by Dr. Nadara Eaton. He had a ballooning procedure done in 2007. He had restenosis of the RCA in 2011 as well as the new stent placed by Dr. Allyson Sabal at [facility]. He has history of unstable angina. He occasionally gets right sided chest pain at rest, feels like his "it takes his breath away at times", no diaphoresis. He states when he is exerting himself he does not feel any pain. He does not note any particular factors that brings on the pain.  Hyperlipidemia- history of high cholesterol is on Lipitor in the past but the dose is unknown. He has not had blood work in some time. He was tested for diabetes approximately one year ago A1c was 5.3%  Rash- possibly one week ago patient was out cleaning out brush. 2 days later he noticed a small red itchy bumps on his right lower leg. Since then the lesions have spread up to his groin. He does note if he scratches the lesion and touches another place and he will  have new bumps in that area. He currently has a few lesions on his buttocks as well. He denies any fever any gross pus he is unsure what may have bitten him. He is concerned this may be contagious. Overall he feels well. He has been using calamine lotion and a soothing cream for poison ivy.  Tobacco user- not ready to quit Alcoholism dry for 10 years  Review of Systems  Per above   GEN- denies fatigue, fever, weight loss,weakness, recent illness CVS- occ chest pain, palpitations RESP- denies SOB, cough, wheeze ABD- denies N/V, change in stools, abd pain GU- denies dysuria, hematuria,change in bladder Neuro- denies headache, dizziness, syncope, seizure activity      Objective:   Physical Exam  GEN- NAD, alert and oriented x3 HEENT- PERRL, EOMI, non injected sclera, pink conjunctiva, MMM, oropharynx clear, poor dentition Neck- Supple, no carotid bruit CVS- RRR, no murmur RESP-CTAB EXT- No edema Pulses- Radial, DP- 2+ Skin- multiple erythematous macules and some slightly raised maculopapular lesions with multiple excoriations from right ankle to the groin. Few scattered lesions on left thigh Near the right groin erythematous extending down some have convalesced together, patchy areas of erythema surrounding this. No drainage from lesions.         Assessment & Plan:

## 2011-05-30 NOTE — Assessment & Plan Note (Signed)
Obtain fasting labs Lipitor will then be restarted based on result

## 2011-05-30 NOTE — Assessment & Plan Note (Signed)
Despite not having medication his blood pressure is not that elevated. I will restart him on low-dose metoprolol secondary to his history with myocardial infarction and hypertension Records will be obtained from [facility].

## 2011-05-30 NOTE — Assessment & Plan Note (Signed)
Patient needs to reestablish with cardiology especially since he had intervention in 2011. He is currently not on Plavix and has been off for greater than one year. He is currently being maintained on aspirin 81 mg. I will prefer that he be seen by cardiology since he has recently had intervention. Also to answer the question whether or not he should have prolonged Plavix therapy. I will not start this at this visit however will continue aspirin. He will have a referral made to [facility] cardiology

## 2011-05-30 NOTE — Patient Instructions (Addendum)
Start the blood pressure and heart medication- Toprol (Metoprolol) take once a day For your itching- I have given you a dose of steroids Take the antibiotic as prescribed You can try cortisone cream over the counter if needed for itching Please get your lab work done, before our next visit, do not eat after midnight I will check your kidney function, liver, and Cholesterol If the rash does not improve over next week, come back for a recheck Next visit in 1 month

## 2011-05-30 NOTE — Assessment & Plan Note (Signed)
Patient does not appear ready to quit but that he has quit in the past longest has been 5 months without tobacco

## 2011-05-30 NOTE — Progress Notes (Signed)
Addended by: Abner Greenspan on: 05/30/2011 11:49 AM   Modules accepted: Orders

## 2011-05-30 NOTE — Assessment & Plan Note (Signed)
This patient was out in the brush in a week as possible he was exposed to chiggers as he has right going all the way up the legs. However it is unknown what insect or contact he's been exposed to. He does have significant pruritus. At this time I will give him a dose of Depo-Medrol in the clinic. Secondary to the convalesced lesions which are open and my concern for superinfection I will start him on Keflex 500 mg twice a day. He may continue to use calamine lotion or over-the-counter hydrocortisone. If this is not improved with these interventions he should return a biopsy may be needed at that time. He has not recently had any viral infections to suggest the exanthams

## 2011-06-13 ENCOUNTER — Other Ambulatory Visit: Payer: Self-pay | Admitting: Family Medicine

## 2011-06-13 DIAGNOSIS — I2510 Atherosclerotic heart disease of native coronary artery without angina pectoris: Secondary | ICD-10-CM

## 2011-06-13 LAB — LIPID PANEL
Cholesterol: 277 mg/dL — ABNORMAL HIGH (ref 0–200)
LDL Cholesterol: 183 mg/dL — ABNORMAL HIGH (ref 0–99)
Total CHOL/HDL Ratio: 5.8 Ratio
Triglycerides: 228 mg/dL — ABNORMAL HIGH (ref <150)
VLDL: 46 mg/dL — ABNORMAL HIGH (ref 0–40)

## 2011-06-13 LAB — COMPREHENSIVE METABOLIC PANEL
ALT: 20 U/L (ref 0–53)
Albumin: 4.4 g/dL (ref 3.5–5.2)
Alkaline Phosphatase: 65 U/L (ref 39–117)
CO2: 28 mEq/L (ref 19–32)
Calcium: 9.3 mg/dL (ref 8.4–10.5)
Chloride: 102 mEq/L (ref 96–112)
Glucose, Bld: 87 mg/dL (ref 70–99)
Potassium: 4.3 mEq/L (ref 3.5–5.3)
Total Bilirubin: 0.6 mg/dL (ref 0.3–1.2)

## 2011-06-13 LAB — CBC
MCH: 32.3 pg (ref 26.0–34.0)
Platelets: 182 10*3/uL (ref 150–400)
WBC: 11 10*3/uL — ABNORMAL HIGH (ref 4.0–10.5)

## 2011-06-14 ENCOUNTER — Telehealth: Payer: Self-pay | Admitting: Family Medicine

## 2011-06-14 MED ORDER — ATORVASTATIN CALCIUM 40 MG PO TABS: 40.0000 mg | ORAL_TABLET | Freq: Every day | ORAL | Status: DC

## 2011-06-14 NOTE — Telephone Encounter (Signed)
His bad LDL was 183, this needs to be less than 100. I am going to restart his cholesterol medication. The lipitor has been sent to the pharmacy His liver function and kidney function was normal.  We are processing his referral to get him to [facility]

## 2011-06-14 NOTE — Telephone Encounter (Signed)
Patient aware.

## 2011-06-22 ENCOUNTER — Ambulatory Visit (INDEPENDENT_AMBULATORY_CARE_PROVIDER_SITE_OTHER): Payer: Medicare Other | Admitting: Cardiology

## 2011-06-22 ENCOUNTER — Encounter: Payer: Self-pay | Admitting: Cardiology

## 2011-06-22 DIAGNOSIS — E785 Hyperlipidemia, unspecified: Secondary | ICD-10-CM

## 2011-06-22 DIAGNOSIS — Z720 Tobacco use: Secondary | ICD-10-CM | POA: Insufficient documentation

## 2011-06-22 DIAGNOSIS — I2510 Atherosclerotic heart disease of native coronary artery without angina pectoris: Secondary | ICD-10-CM

## 2011-06-22 DIAGNOSIS — Z91199 Patient's noncompliance with other medical treatment and regimen due to unspecified reason: Secondary | ICD-10-CM | POA: Insufficient documentation

## 2011-06-22 DIAGNOSIS — F17200 Nicotine dependence, unspecified, uncomplicated: Secondary | ICD-10-CM

## 2011-06-22 DIAGNOSIS — I10 Essential (primary) hypertension: Secondary | ICD-10-CM

## 2011-06-22 DIAGNOSIS — Z9119 Patient's noncompliance with other medical treatment and regimen: Secondary | ICD-10-CM

## 2011-06-22 MED ORDER — ATORVASTATIN CALCIUM 80 MG PO TABS: 80.0000 mg | ORAL_TABLET | Freq: Every day | ORAL | Status: DC

## 2011-06-22 MED ORDER — SILDENAFIL CITRATE 25 MG PO TABS: 25.0000 mg | ORAL_TABLET | Freq: Every day | ORAL | Status: DC | PRN

## 2011-06-22 MED ORDER — CHLORTHALIDONE 25 MG PO TABS: 12.5000 mg | ORAL_TABLET | Freq: Every day | ORAL | Status: DC

## 2011-06-22 MED ORDER — NITROGLYCERIN 0.4 MG SL SUBL: 0.4000 mg | SUBLINGUAL_TABLET | SUBLINGUAL | Status: DC | PRN

## 2011-06-22 NOTE — Patient Instructions (Signed)
Your physician has recommended you make the following change in your medication: start taking Nitroglycerin as needed for chest pain, Chlorthalidone 12.5 mg (1/2 of 25 mg tablet) and increase Lipitor to 80 mg at bedtime  Your physician recommends that you return for lab work in: 1 month  Your physician discussed the hazards of tobacco use. Tobacco use cessation is recommended and techniques and options to help you quit were discussed.   Your physician has requested that you regularly monitor and record your blood pressure readings at home. Please use the same machine at the same time of day to check your readings and record them to bring to your follow-up visit.  Your physician recommends that you schedule a follow-up appointment in:  1 month for a blood pressure check and in 8 months

## 2011-06-22 NOTE — Assessment & Plan Note (Signed)
Hyperlipidemia is inadequately controlled.  Atorvastatin will be increased to 80 mg q.d. With a repeat lipid profile in one month.

## 2011-06-22 NOTE — Assessment & Plan Note (Addendum)
Hypertension is suboptimally controlled.  Chlorthalidone 12.5 mg q.d will be added to his medical regime with serial assessment of blood pressure, serum potassium and renal function.  Blood pressure control will be reassessed at a visit with the cardiology nurses in one month.  I will see this nice gentleman again in 7 months.

## 2011-06-22 NOTE — Progress Notes (Signed)
HPI:  Brandon [name] is seen in the office today for an initial visit at the kind request of Dr. Jeanice Lim.  He requires continuing care of coronary artery disease.  He previously received care from [facility], but has not been seen by that practice for some years.  Until recently he did not have insurance coverage, which precluded appropriate physician visits and medication.  He has continued to smoke cigarettes at the rate of one pack per day.  He refrained from smoking for 2 weeks a while back, but then resumed.  Over recent weeks, he has experienced episodes of right-sided chest discomfort at night awakening him from sleep and associated with anxiety and tachypalpitations.  Symptoms resolve over the course of a few minutes without intervention.  Current Outpatient Prescriptions on File Prior to Visit  Medication Sig Dispense Refill  . aspirin 81 MG tablet Take 81 mg by mouth daily.        Marland Kitchen atorvastatin (LIPITOR) 40 MG tablet Take 1 tablet (40 mg total) by mouth daily.  30 tablet  3  . metoprolol succinate (TOPROL XL) 25 MG 24 hr tablet Take 1 tablet (25 mg total) by mouth daily.  30 tablet  3    No Known Allergies    Past Medical History  Diagnosis Date  . Hyperlipidemia   . Hypertension   . Arteriosclerotic cardiovascular disease (ASCVD) 2006    2006-acute IMI treated with urgent RCA stent; 2007-Cutting Balloon for in-stent restenosis; 02/2010-presented with ACS and minimal troponin elevation:70% LAD, 80% distal circumflex, 80% proximal ramus branch vessel,in-stent restenosis of 70% in the RCA; BMS for proximal critical RCA stenosis  . Tobacco abuse     40 pack years  . History of noncompliance with medical treatment     Due to financial considerations    Family History  Problem Relation Age of Onset  . Depression Mother 89    Suicide  . Heart disease Father 28    Deceased from massive heart-attack  . Hyperlipidemia Father   . Hypertension Father   . COPD Maternal  Grandfather   . Cancer Paternal Grandmother     Male Cancer  . Heart disease Paternal Grandmother      History   Social History  . Marital Status: Divorced    Spouse Name: N/A    Number of Children: 2  . Years of Education: N/A   Occupational History  . Disabled     Previously employed as an Product manager   Social History Main Topics  . Smoking status: Current Everyday Smoker -- 1.0 packs/day for 27 years  . Smokeless tobacco: Never Used  . Alcohol Use: No      Alcoholism- quit 2002  . Drug Use: No  . Sexually Active: Not on file   Other Topics Concern  . Not on file   Social History Narrative        ROS: General: no anorexia, weight gain or weight loss Cardiac: no chest pain, dyspnea, orthopnea, PND,  or syncope Respiratory: no cough, sputum production or hemoptysis GI: no nausea, abdominal pain, emesis, diarrhea or constipation Integument: no significant lesions Neurologic: No muscle weakness or paralysis; no speech disturbance; no headache All other systems reviewed and are negative.  PHYSICAL EXAM: BP 140/94  Pulse 85  Resp 18  Ht 5\' 6"  (1.676 m)  Wt 234 lb 1.9 oz (106.196 kg)  BMI 37.79 kg/m2  SpO2 92%  General-Well-developed; no acute distress Body Habitus-moderately overweight HEENT-[place]/AT; PERRL; EOM  intact; conjunctiva and lids nl; few extractions Neck-No JVD; no carotid bruits Endocrine-No thyromegaly Lungs-Clear lung fields; resonant percussion; normal I-to-E ratio Cardiovascular- normal PMI; normal S1 and S2 Abdomen-BS normal; soft and non-tender without masses or organomegaly Musculoskeletal-No deformities, cyanosis or clubbing Neurologic-Nl cranial nerves; symmetric strength and tone Skin- Warm, no significant lesions Extremities-Nl distal pulses; no edema  EKG:   Normal sinus rhythm; within normal limits; no previous tracing for comparison.  ASSESSMENT AND PLAN:

## 2011-06-22 NOTE — Assessment & Plan Note (Signed)
He is strongly encouraged to resume attempts to discontinue use of tobacco.  He claims that Wellbutrin, nicotine replacement therapy and to shin takes have not been effective for him in the past.  He refuses hypnosis, which he believes violates his religious principles.

## 2011-06-22 NOTE — Assessment & Plan Note (Addendum)
Patient has done well since intervention more than one year ago.  Current nocturnal chest discomfort atypical for myocardial ischemia.  Nitroglycerin will be provided in the way of a therapeutic trial.  We will defer diagnostic testing to determine what the course of this problem will be.

## 2011-06-27 ENCOUNTER — Encounter: Payer: Self-pay | Admitting: Family Medicine

## 2011-06-27 ENCOUNTER — Ambulatory Visit (INDEPENDENT_AMBULATORY_CARE_PROVIDER_SITE_OTHER): Payer: Medicare Other | Admitting: Family Medicine

## 2011-06-27 VITALS — BP 114/86 | HR 95 | Resp 16 | Ht 66.0 in | Wt 236.1 lb

## 2011-06-27 DIAGNOSIS — F17200 Nicotine dependence, unspecified, uncomplicated: Secondary | ICD-10-CM

## 2011-06-27 DIAGNOSIS — Z720 Tobacco use: Secondary | ICD-10-CM

## 2011-06-27 DIAGNOSIS — G4700 Insomnia, unspecified: Secondary | ICD-10-CM

## 2011-06-27 DIAGNOSIS — Z23 Encounter for immunization: Secondary | ICD-10-CM

## 2011-06-27 DIAGNOSIS — I10 Essential (primary) hypertension: Secondary | ICD-10-CM

## 2011-06-27 MED ORDER — INFLUENZA VAC TYPES A & B PF IM SUSP: 0.5000 mL | Freq: Once | INTRAMUSCULAR | Status: DC

## 2011-06-27 MED ORDER — TRAZODONE HCL 50 MG PO TABS: 50.0000 mg | ORAL_TABLET | Freq: Every evening | ORAL | Status: AC | PRN

## 2011-06-27 NOTE — Progress Notes (Signed)
  Subjective:    Patient ID: Brandon [name], male    DOB: [birth year], 40 y.o.   MRN: [id number]  HPI Has difficulty sleeping- unable to fall asleep, occ has chest pains at bedtime, no naps during the day, he drinks caffiene free drinks, no coffee, tries to go to bed at 10pm, often awake until 1-2 oclock before falling asleep, sleeps for 4-5 hours. Given NTG for his pain at bedtime per cardiology , has taken 3 tablets since visit on 8/30.  HTN- blood pressure has improved, now taking chorthalidone and Beta blocker, no side effects  ED- given trial of viagra by cardiology   CAD- lipitor increased to 80mg , repeat labs and visit in 1 month with cards, reviewed cardiology note  Review of Systems - per above  GEN- no recent illness, denies fatigue  CVS- occ chest pain, no palpitations, no leg edema  RESP- no SOB, Cough, Wheeze    Objective:   Physical Exam  GEN- NAD, alert and oriented x 3, pleasant, obese  CVS-RRR, no murmur  RESP-CTAB  EXT- no edema  Pulse- radial and DP 2+ Psych- normal affect, quiet gentleman, not depressed or anxious appearing       Assessment & Plan:

## 2011-06-27 NOTE — Assessment & Plan Note (Signed)
I have also reiterated tobacco abuse with patient. He has been on multiple medications in the past. He is going to take time to think about how he wants to quit, right now he does not have a willpower.

## 2011-06-27 NOTE — Assessment & Plan Note (Addendum)
Will give a trial of trazodone. Patient has tried Ambien in the past which did not help. I'm not sure of his occasional chest pains that he gets at bedtime. He has taken nitroglycerin which has helped. We'll defer to cardiology to discuss possible long-acting nitrate. He does appear to be wound at bedtime and frustrated over not being able to sleep which may be the cause of his "chest pain". Recheck in 6 weeks.

## 2011-06-27 NOTE — Assessment & Plan Note (Signed)
Blood pressure improved with addition of diuretic. Patient will continue both diuretic and beta blocker.

## 2011-06-27 NOTE — Patient Instructions (Signed)
Start the sleeping medication, take 1 hour before bedtime F/u with the heart doctor as scheduled You can take 2 of the 40mg  of lipitor daily until you use them up then start the 80mg  tablets You have been given your flu shot today F/U in 6 weeks

## 2011-07-13 ENCOUNTER — Encounter: Payer: Self-pay | Admitting: Cardiology

## 2011-07-19 ENCOUNTER — Other Ambulatory Visit: Payer: Self-pay | Admitting: Cardiology

## 2011-07-20 LAB — LIPID PANEL
HDL: 41 mg/dL (ref >39)
Triglycerides: 232 mg/dL — ABNORMAL HIGH (ref <150)

## 2011-07-24 ENCOUNTER — Other Ambulatory Visit: Payer: Self-pay

## 2011-07-24 DIAGNOSIS — E785 Hyperlipidemia, unspecified: Secondary | ICD-10-CM

## 2011-07-24 MED ORDER — EZETIMIBE 10 MG PO TABS: 10.0000 mg | ORAL_TABLET | Freq: Every day | ORAL | Status: DC

## 2011-08-08 ENCOUNTER — Ambulatory Visit: Payer: Medicare Other | Admitting: Family Medicine

## 2011-08-10 ENCOUNTER — Other Ambulatory Visit: Payer: Self-pay | Admitting: *Deleted

## 2011-08-10 DIAGNOSIS — E785 Hyperlipidemia, unspecified: Secondary | ICD-10-CM

## 2011-08-15 ENCOUNTER — Encounter: Payer: Self-pay | Admitting: *Deleted

## 2011-08-20 ENCOUNTER — Inpatient Hospital Stay (HOSPITAL_COMMUNITY)
Admission: EM | Admit: 2011-08-20 | Discharge: 2011-08-22 | DRG: 251 | Disposition: A | Discharge status: Home / Self Care | Payer: Medicare Other | Source: Ambulatory Visit | Attending: Cardiology | Admitting: Cardiology

## 2011-08-20 DIAGNOSIS — Z7982 Long term (current) use of aspirin: Secondary | ICD-10-CM

## 2011-08-20 DIAGNOSIS — Z91199 Patient's noncompliance with other medical treatment and regimen due to unspecified reason: Secondary | ICD-10-CM

## 2011-08-20 DIAGNOSIS — Z9119 Patient's noncompliance with other medical treatment and regimen: Secondary | ICD-10-CM

## 2011-08-20 DIAGNOSIS — Z8249 Family history of ischemic heart disease and other diseases of the circulatory system: Secondary | ICD-10-CM

## 2011-08-20 DIAGNOSIS — T82897A Other specified complication of cardiac prosthetic devices, implants and grafts, initial encounter: Secondary | ICD-10-CM | POA: Diagnosis present

## 2011-08-20 DIAGNOSIS — I2510 Atherosclerotic heart disease of native coronary artery without angina pectoris: Secondary | ICD-10-CM | POA: Diagnosis present

## 2011-08-20 DIAGNOSIS — R079 Chest pain, unspecified: Secondary | ICD-10-CM

## 2011-08-20 DIAGNOSIS — I10 Essential (primary) hypertension: Secondary | ICD-10-CM | POA: Diagnosis present

## 2011-08-20 DIAGNOSIS — I2119 ST elevation (STEMI) myocardial infarction involving other coronary artery of inferior wall: Principal | ICD-10-CM | POA: Diagnosis present

## 2011-08-20 DIAGNOSIS — Y849 Medical procedure, unspecified as the cause of abnormal reaction of the patient, or of later complication, without mention of misadventure at the time of the procedure: Secondary | ICD-10-CM | POA: Diagnosis present

## 2011-08-20 DIAGNOSIS — I2582 Chronic total occlusion of coronary artery: Secondary | ICD-10-CM | POA: Diagnosis present

## 2011-08-20 DIAGNOSIS — F17200 Nicotine dependence, unspecified, uncomplicated: Secondary | ICD-10-CM | POA: Diagnosis present

## 2011-08-20 DIAGNOSIS — E785 Hyperlipidemia, unspecified: Secondary | ICD-10-CM | POA: Diagnosis present

## 2011-08-20 DIAGNOSIS — Z79899 Other long term (current) drug therapy: Secondary | ICD-10-CM

## 2011-08-20 LAB — COMPREHENSIVE METABOLIC PANEL
ALT: 29 U/L (ref 0–53)
Alkaline Phosphatase: 70 U/L (ref 39–117)
BUN: 16 mg/dL (ref 6–23)
CO2: 23 mEq/L (ref 19–32)
Calcium: 8.1 mg/dL — ABNORMAL LOW (ref 8.4–10.5)
GFR calc Af Amer: >90 mL/min (ref >90)
GFR calc non Af Amer: >90 mL/min (ref >90)
Glucose, Bld: 128 mg/dL — ABNORMAL HIGH (ref 70–99)
Sodium: 133 mEq/L — ABNORMAL LOW (ref 135–145)

## 2011-08-20 LAB — PROTIME-INR: Prothrombin Time: 48.5 seconds — ABNORMAL HIGH (ref 11.6–15.2)

## 2011-08-20 LAB — APTT: aPTT: >200 seconds (ref 24–37)

## 2011-08-20 LAB — CBC
HCT: 39.3 % (ref 39.0–52.0)
MCHC: 36.9 g/dL — ABNORMAL HIGH (ref 30.0–36.0)
MCV: 89.1 fL (ref 78.0–100.0)
RDW: 12.9 % (ref 11.5–15.5)

## 2011-08-20 LAB — CARDIAC PANEL(CRET KIN+CKTOT+MB+TROPI): Total CK: 65 U/L (ref 7–232)

## 2011-08-21 DIAGNOSIS — I214 Non-ST elevation (NSTEMI) myocardial infarction: Secondary | ICD-10-CM

## 2011-08-21 DIAGNOSIS — I219 Acute myocardial infarction, unspecified: Secondary | ICD-10-CM

## 2011-08-21 LAB — BASIC METABOLIC PANEL
BUN: 13 mg/dL (ref 6–23)
Calcium: 9.3 mg/dL (ref 8.4–10.5)
Creatinine, Ser: 0.79 mg/dL (ref 0.50–1.35)
GFR calc non Af Amer: >90 mL/min (ref >90)
Glucose, Bld: 115 mg/dL — ABNORMAL HIGH (ref 70–99)

## 2011-08-21 LAB — POCT I-STAT, CHEM 8
Calcium, Ion: 1.11 mmol/L — ABNORMAL LOW (ref 1.12–1.32)
Chloride: 100 mEq/L (ref 96–112)
Glucose, Bld: 123 mg/dL — ABNORMAL HIGH (ref 70–99)
HCT: 43 % (ref 39.0–52.0)
Hemoglobin: 14.6 g/dL (ref 13.0–17.0)
TCO2: 22 mmol/L (ref 0–100)

## 2011-08-21 LAB — CBC
HCT: 44.4 % (ref 39.0–52.0)
Hemoglobin: 15.5 g/dL (ref 13.0–17.0)
MCH: 31.8 pg (ref 26.0–34.0)
MCHC: 34.9 g/dL (ref 30.0–36.0)
RDW: 13.1 % (ref 11.5–15.5)

## 2011-08-21 LAB — CARDIAC PANEL(CRET KIN+CKTOT+MB+TROPI)
CK, MB: 54.7 ng/mL (ref 0.3–4.0)
Relative Index: 6.2 — ABNORMAL HIGH (ref 0.0–2.5)
Troponin I: >25 ng/mL (ref <0.30)

## 2011-08-21 LAB — HEMOGLOBIN A1C
Hgb A1c MFr Bld: 5.6 % (ref <5.7)
Mean Plasma Glucose: 114 mg/dL (ref <117)

## 2011-08-21 NOTE — H&P (Signed)
Brandon [name], Brandon [name]               ACCOUNT NO.:  [id number]  MEDICAL RECORD NO.:  [id number]  LOCATION:  [facility]                         FACILITY:  [facility]  PHYSICIAN:  Lenon Oms, MD  DATE OF BIRTH:  19-May-1971  DATE OF ADMISSION:  08/20/2011 DATE OF DISCHARGE:                             HISTORY & PHYSICAL   CHIEF COMPLAINT:  Chest pain.  HISTORY OF PRESENT ILLNESS:  Brandon [name] is a 40 year old [place] gentleman who presented to the emergency department with chief complaint of chest pain.  Per report by the patient and EMS, the patient started having chest pain approximately 1:15 this afternoon.  The patient reported that his pain was sharp, stabbbing 10/10.  His pain did radiate down to his left arm.  The patient denied any other associated symptoms at this time.  The patient was brought into the emergency department here at [facility].  The patient was found to have ST segment elevation in the inferior leads II, III, and aVF.  He was initially treated with aspirin, nitroglycerin, morphine, and heparin bolus.  The patient did have hypertension after getting nitroglycerin, however, responded well to intravenous fluid boluses.  Cardiac catheterization team was activated for further management.  PAST MEDICAL HISTORY: 1. Known coronary artery disease status post RCA stent in 2006 with     stent restenosis in June 2007 treated with cutting balloon.  The     patient last had his catheterization in May 2011 which showed 70%     in-stent restenosis of his right coronary stent and 99% proximal     lesion status post angioplasty to the proximal lesion and vision     stenting of the in-stent restenosis in the right mid right coronary     artery. 2. Dyslipidemia. 3. Hypertension. 4. Tobacco abuse. 5. History of noncompliance secondary to financial reasons. 6. Family history of coronary artery disease.  SOCIAL HISTORY:  The patient denies any current tobacco use or  alcohol use.  ALLERGIES:  No known drug allergies.  MEDICATIONS:  Lipitor, nitroglycerin, chlorthalidone, metoprolol.  REVIEW OF SYSTEMS:  As stated in the HPI.  All other review of systems are otherwise negative.  PHYSICAL EXAMINATION:  VITAL SIGNS:  Afebrile, vital signs stable. GENERAL:  He is an obese male in moderate distress. HEENT:  Normocephalic, atraumatic.  Pupils equally round and reactive to light.  Extraocular movements intact.  Anicteric sclerae. NECK:  Supple.  No JVD. HEART:  Regular rate and rhythm.  Normal S1 and S2. LUNGS:  Clear to auscultation. ABDOMEN:  Soft, nontender, nondistended. EXTREMITIES:  No lower extremity edema. NEUROLOGIC:  Awake, alert, and oriented x3.  EKG revealed ST segment elevation in inferior leads.  Laboratory data is pending at this time.  IMPRESSION/Plan:  Inferior ST segment elevation myocardial infarction, in patient with history of known coronary artery disease with stenting to the right coronary artery.  Emergency cardiac catheterization, Dr. Herbie Baltimore present and evaluated the patient and he will be takien to the cardiac catheterization lab.  The patient received aspirin 325 and heparin bolus.  Further management depending on results of procedure. The patient is agreeable with proceeding as above.  Code status is full  code.          ______________________________ Lenon Oms, MD     PB/MEDQ  D:  08/20/2011  T:  08/20/2011  Job:  [id number]  Electronically Signed by Lenon Oms MD on 08/21/2011 03:09:57 PM

## 2011-08-22 DIAGNOSIS — I2119 ST elevation (STEMI) myocardial infarction involving other coronary artery of inferior wall: Secondary | ICD-10-CM

## 2011-08-22 LAB — CBC
HCT: 41.9 % (ref 39.0–52.0)
MCHC: 35.6 g/dL (ref 30.0–36.0)
RDW: 13.3 % (ref 11.5–15.5)

## 2011-08-22 LAB — BASIC METABOLIC PANEL
BUN: 11 mg/dL (ref 6–23)
GFR calc Af Amer: >90 mL/min (ref >90)
GFR calc non Af Amer: >90 mL/min (ref >90)
Potassium: 3.7 mEq/L (ref 3.5–5.1)

## 2011-08-22 LAB — POCT I-STAT, CHEM 8
Calcium, Ion: 1.06 mmol/L — ABNORMAL LOW (ref 1.12–1.32)
Glucose, Bld: 98 mg/dL (ref 70–99)
HCT: 44 % (ref 39.0–52.0)
Hemoglobin: 15 g/dL (ref 13.0–17.0)
Potassium: 2.8 mEq/L — ABNORMAL LOW (ref 3.5–5.1)

## 2011-08-24 NOTE — Cardiovascular Report (Signed)
[name], [name] NO.:  [id number]  MEDICAL RECORD NO.:  [id number]  LOCATION:  2903                         FACILITY:  [facility]  PHYSICIAN:  Landry Corporal, MD DATE OF BIRTH:  01-19-1971  DATE OF PROCEDURE:  08/20/2011 DATE OF DISCHARGE:                           CARDIAC CATHETERIZATION   PRIMARY CARDIOLOGIST:  Gerrit Friends. Dietrich Pates, MD, [facility].  PERFORMING PHYSICIAN:  Landry Corporal, MD.  PROCEDURES PERFORMED: 1. Left heart catheterization via the 6-French right femoral artery     access. 2. Native coronary angiography. 3. Left ventriculography in the RAO projection, 12 mL of contrast per     second for a total of 25 mL. 4. Intracoronary nitroglycerin injection x3 with 200 mcg of each. 5. Aspiration thrombectomy of the mid RCA 100% occlusion. 6. Successful PTCA with noncompliant and cutting balloon.  Balloons of     the in-stent restenosis and thrombosis of proximal-to-mid RCA     stents reducing 100% to less than 10% lesions with TIMI-3 flow     post.  INDICATION: 1. Inferior ST-elevation MI.  [name] is a 40 year old man with a complex coronary artery disease history. History of PCI to the RCA in March 2006, with placement of a 3.0 x     33-mm Cypher drug-eluting stent.  He then subsequently had followup     catheterization in 2007, for in-stent stenosis and was treated with     a cutting balloon angioplasty with a 3.5 x 20-mm cutting balloon,     reducing the RCA in-stent restenosis.  Subsequently, he did well     until May 2011, where he had in-stent stenosis again of the RCA     stents.  He had 70% in-stent restenosis and 99% proximal stent     stenosis and he was treated well with 3.0 x 15-mm Vision bare metal     stents postdilated to 3.25 mm.  He was treated medically for     proximal/mid LAD lesions as well as distal circumflex lesions after     outpatient Myoview showed no evidence of ischemia.  He was     initially a patient in  [facility] and Vascular.  However,     he is now a patient of Dr. Marvel Plan.    He was doing well until roughly 5 o'clock this evening on August 20, 2011, when he  had a sudden onset of severe substernal anginal chest pain with shortness of breath.                        Twenty minutes after EMS was notified, they arrived and he was noted to have inferior                        ST elevations.  He arrived at [facility] Emergency Room at 1821 hours.  EKG noted inferior ST elevations in the cath lab activated while EMS was en route.  Due to hsi rapid arrival, he was monitored in the ED until sufficient cath lab personnel had arrived.  Upon arrival to the cath lab, the patient was still  having at least 8/10 chest pain and was hypotensive when given nitroglycerin and therefore, received IV fluid bolus.  He began with 4000 units of heparin in the emergency room and brought to the cardiac catheterization lab for emergency cardiac catheterization.  The patient was well from the cardiac catheterization procedure which we discussed with him briefly as well as the risks, benefits, alternatives, and indications.  He agreed to proceed.  An emergency consent was presumed as verbal consent was given.  PROCEDURE:  The patient was brought to [facility] Cardiac Catheterization Lab from the emergency room.  He was prepped and draped in usual sterile fashion.  A time-out period was performed and the right groin was anesthetized with 1% subcutaneous lidocaine.  The right femoral head was localized using tactile fluoroscopic guidance.  The right common femoral artery was then accessed using a modified Seldinger technique with placement of a 5-French sheath.  The sheath was aspirated and flushed and first a 5-French JL-4 catheter was advanced over the wire and used to engage the left coronary artery.  Multiple angiographic views of left coronary system were obtained, then this was  exchanged for a 6-French JR-4 guiding catheter which was used to engage the right coronary artery.  After one image revealed 100% occlusion of the right coronary artery, decision was made to proceed with percutaneous intervention.  INITIAL ANGIOGRAPHIC FINDINGS: 1. The RCA has the evidence of a long stented segment just into what     looks like the overlap of the one new bare metal stent with the old     Cypher stent was 100% occluded, thrombotic occlusion with TIMI-0     flow distally. 2. Left main is a large-caliber vessel, bifurcates into an LAD and     circumflex artery. 3. There is no disease in the left main. 4. The LAD actually gives rise to a very very proximal diagonal which     runs in the ramus intermedius branch.  This vessel actually     bifurcates after giving rise what looks like almost a small septal     trunk itself.  This vessel is actually larger than the main LAD and     has no real significant disease. 5. The LAD after giving rise to this diagonal has a tubular 40-50, may     be even 60 at the worst percent stenosis.  It was read as being 60%-     70% diffuse, in the previous catheterization, does not appear to be     as severe as this, the midportion of the LAD has a roughly 60%-70%     lesion and a bridging at a bridge point.  The remainder of the LAD     is just diffusely small vessel with maybe a largest diameter is 2.5     mm. 6. The circumflex is a small-caliber diminutive vessel.  It     trifurcates distally to the atrioventricular groove and just after     the 2 branches occur, there is a roughly 70% lesion in a sub 2-mm     vessel.  Proximal to that at a small obtuse marginal, there is a     roughly 40% lesion.  At this time, the decision was made to proceed     with intervention on the RCA lesion.  INTERVENTION PROCEDURE: 1. Guide 6-French JR-4; guidewire BMW.     a.     After passing the BMW wire, there was now TIMI-2  flow      downstream with  reperfusion of the distal vessel albeit TIMI-2      flow.     b.     Pronto aspiration thrombectomy catheter was advanced down to      the extent passable and one pass was made.  The catheter was then      removed completely out of the body as angiographic imaging there      showed the wire actually to be in the RV marginal branch.  The      wire was then pulled back and advanced into the main RCA HL.      Intracoronary nitroglycerin was injected and there was a TIMI-3      flow distally with several areas of in-stent stenosis in the      existing stent structures.  The patient, at this time, became somewhat hypotensive and was given intravenous fluid boluses to bring the pressures back up to the 100 mmHg. 1. Predilatation balloon 2.0 mm x 10 mm advanced distally and inflated     to 6 cm 41 seconds followed by 2 more proximal subsequent     inflations.     a.     Twelve atmospheres for 30 seconds.     b.     Ten atmospheres for 30 seconds. 2. [place] Quantum apex 2.75 mm x 20 mm.     a.     Fourteen atmospheres for 60 seconds.     b.     Eighteen atmospheres for 60 seconds from distal to proximal.     c.     Postballoon angioplasty revealed still present at least 30%      in-stent stenosis.  Additional 200 mcg of nitroglycerin wasadministered.  At this time, the patient's blood pressure stays      stable. 3. Flextome cutting balloon 3.0 mm x 15 mm.     a.     Sixteen atmospheres for 60 seconds in the midportion.     b.     Fourteen atmospheres for 120 seconds more distal.     c.     Fourteen atmospheres for 120 seconds more proximal.  Final      diameter here was roughly 3.15 mm, but tight.     d.     With removing the balloon, I did meet with some      difficulty requiring me to remove the wire and the balloon      disengaging the guide catheter.  At this time, I then went up with a 5-French JR-4 diagnostic catheter and performed angiographic imaging, which revealed a still  hazy, irregular lesion at a small atrial branch, which was at the site of the original 100% occlusion.  Therefore, decision was made to go back up to the guiding catheter and re-balloon then area.  The diagnostic catheter was exchanged again for a 6-French JL-4 guiding catheter.  The lesion was then rewired with a new BMW wire. 1. Final balloon was an [place] Quantum apex 3.0 mm x 12 mm.     a.     Inflated at 16 atmospheres for 105 seconds.  The patient did      have significant anginal pain with this inflation, then the      balloon was deflated and pulled back into the guide.  Final      angiography revealed most complete resolution of that lesion and      decision was made then to  stop with any further intervention.  The      balloon was then removed completely out of the body.  Final      angiography with and without the guidewire in place revealed no      evidence of any dissection or perforation with residual maybe 10%      in-stent stenosis.  At the time of the second balloon, I began Integrilin infusion after initial bolus was administered.  After completion of the interventional part of the procedure, the guiding catheter was then removed out of the body over a wire and exchanged for an angled pigtail catheter was used to advance across the aortic valve into the left ventricle.  Left ventricle hemodynamics were then measured.  Left ventriculography was then performed in the RAO projection with total of 25 mL contrast at a rate of 12 mL/second.  The left ventricular hemodynamics were then remeasured and the catheter pullback across the aortic valve measuring pullback gradient.  The catheter was then removed completely out of body over a wire and the sheath was sutured in place.  There were no complications.  ESTIMATED BLOOD LOSS:  Less than 20 mL, however, a total of 30 mm was sent for labs.  The patient was stable before, during, and after the procedure with  no complications.  HEMODYNAMICS: 1. Left ventricular pressure is 112/17 mmHg with an EDP of 24 mmHg. 2. Central aortic pressure 117/83 mmHg with a mean of 98 mmHg. 3. Left ventriculography revealed EF of at least 50% with some mild     inferior mid and basilar hypokinesis.  FINAL IMPRESSION: 1. Successful cutting balloon and noncompliant balloon percutaneous     transluminal coronary angioplasty of in-stent restenosis and 100%     thrombotic occlusion of the proximal-to-mid right coronary artery     stents reducing 100% to less than 10% residual lesion.     a.     The decision was made not to proceed with restenting as the      patient already has 2 layers of stent and I was reluctant to put      another stent that has proven to be not done well with stents in      the past.  There is also questionable left anterior descending      artery and circumflex lesions which may have progressed from      previous evaluation.     b.     Initial onset of pain was 1700; arrived at [facility] ED at 1821;      arrived at the cardiac catheterization lab at 1825 and Pronto      advancement was at 1854 with reperfusion.  Total device time 33      minutes. 2. Mostly preserved left ventricular ejection fraction with mild mid     basal inferior hypokinesis. 3. Diffusely small-caliber coronary arteries and least moderate     lesions in the circumflex AV groove portion as well as proximal mid     LAD.  PLAN: 1. Aspirin and Prasugrel for now.  He would benefit from being on dual     antiplatelet therapy for at least a year based on PCI cure trial. 2. Continue Integrilin infusion for 18 hours as there was significant     thrombosis in the stent. 3. P.o. beta-blocker tonight. 4. Smoking cessation counseling.  The patient will be admitted to the cardiac ICU under [facility] Cardiology, Dr. Marvel Plan service.  I have to discuss this with  the on- call fellow who has admitted the patient.           ______________________________ Landry Corporal, MD     DWH/MEDQ  D:  08/20/2011  T:  08/21/2011  Job:  [id number]  cc:   Gerrit Friends. Dietrich Pates, MD, [facility] Cardiac Catheterization Laboratory  Electronically Signed by Bryan Lemma MD on 08/24/2011 01:18:26 AM

## 2011-08-26 NOTE — Discharge Summary (Signed)
Brandon [name], SITU NO.:  [id number]  MEDICAL RECORD NO.:  [id number]  LOCATION:  2003                         FACILITY:  [facility]  PHYSICIAN:  Marca Ancona, MD      DATE OF BIRTH:  [birth year]  DATE OF ADMISSION:  08/20/2011 DATE OF DISCHARGE:  08/22/2011                              DISCHARGE SUMMARY   PRIMARY CARDIOLOGIST:  Gerrit Friends. Dietrich Pates, MD, [facility]  DISCHARGE DIAGNOSIS:  Acute inferior ST-segment elevation myocardial infarction.  SECONDARY DIAGNOSES: 1. Coronary disease status post prior right coronary artery stenting     with history of in-stent restenosis and acute stent thrombosis this     admission. 2. Hypertension. 3. Hyperlipidemia. 4. Remote tobacco abuse. 5. History of noncompliance secondary to financial reasons.  ALLERGIES:  No known drug allergies.  PROCEDURES: 1. Left heart cardiac catheterizations performed emergently on August 12, 2011 revealing acute stent thrombosis within the previously     placed RCA stent, and total occlusion of the distal vessel as a     result.  The patient had nonobstructive disease throughout the LAD     and left circumflex.  EF was 50% with mild inferior and basilar     hypokinesis.  The right coronary artery was successfully treated     with cutting balloon angioplasty with reduction in stenosis from     100% to 10% and restoration of distal flow. 2. 2-D echocardiogram, August 21, 2011:  EF 55-60% with mild basilar     inferior hypokinesis.  There was trivial aortic insufficiency and     mitral regurgitation.  HISTORY OF PRESENT ILLNESS:  A 40 year old male with prior history of coronary artery disease status post right coronary stenting in 2007 complicated by in-stent restenosis in June 2007 and subsequent in-stent restenosis again in May 2011, at which point restenting with bare metal stents was performed.  The patient was in his usual state of health until the afternoon of August 20, 2011  when he began to experience sharp stabbing 10/10 chest discomfort with radiation down his left arm. He eventually presented to the [facility] and was found to have ST- segment elevation in inferior leads.  He was treated with aspirin, nitroglycerin, morphine, heparin bolus, and code STEMI was activated.  HOSPITAL COURSE:  The patient was taken emergently to the [facility] cath lab where diagnostic catheterizations performed showing acute thrombosis within the previously placed right coronary artery stent.  He otherwise had nonobstructive disease and low normal LV function. Attention was turned to the right coronary artery where successful thrombectomy was performed with restoration of flow and reduction of stenosis to less than 10%.  Decision was made not to restent this area, the patient already has 2 layers of stents in that vessel.  He was loaded with Effient therapy and monitored in the Coronary Intensive Care Unit postprocedurally.  The patient had no recurrent chest discomfort.  He did eventually peak his CK at 884, MB at 54.7, troponin-I greater than 25.  He was maintained on beta-blocker, ACE inhibitor, high-dose statin therapy along with aspirin and Effient as previously mentioned.  He was evaluated by Cardiac Rehab and was  able to ambulate without recurrent symptoms or limitations.  He was also seen by smoking cessation counselor with encouragement to cease all smoking activities.  The patient will be discharged home today in good condition.  Outpatient referral has been made for cardiac rehab in [place].  DISCHARGE LABORATORY DATA:  Hemoglobin 14.9, hematocrit 41.9, WBC 9.5, platelets 166.  Sodium 138, potassium 3.7, chloride 102, CO2 26, BUN 11, creatinine 0.83, glucose 96.  Total bilirubin 0.4, alkaline phosphatase 78, AST 19, ALT 29, total protein 6.0, albumin 3.3, calcium 9.5. Hemoglobin A1c 5.6.  CK 884, MB 54.7, troponin-I greater than 25.  MRSA screen was  negative.  DISPOSITION:  The patient will be discharged home today in good condition.  FOLLOWUP PLANS AND APPOINTMENTS:  I will arrange a followup with Brandon [name], nurse practitioner in our [place] office on September 06, 2011 at 11:40 a.m.  DISCHARGE MEDICATIONS: 1. Carvedilol 6.25 mg b.i.d. 2. Lisinopril 5 mg daily. 3. Prasugrel 10 mg daily. 4. Nitroglycerin 0.4 mg sublingual p.r.n. chest pain. 5. Aspirin 81 mg daily. 6. Atorvastatin 80 mg daily. 7. Trazodone 50 mg at bedtime p.r.n.  OUTSTANDING LABORATORY STUDIES:  None.  DURATION OF DISCHARGE ENCOUNTER:  45 minutes including physician time.     Brandon [name], [name]   ______________________________ Marca Ancona, MD    CB/MEDQ  D:  08/22/2011  T:  08/22/2011  Job:  [id number]  Electronically Signed by Brandon [name] on 08/26/2011 02:02:18 PM Electronically Signed by Marca Ancona MD on 08/26/2011 11:51:18 PM

## 2011-09-05 ENCOUNTER — Encounter: Payer: Self-pay | Admitting: Adult Health

## 2011-09-06 ENCOUNTER — Ambulatory Visit (INDEPENDENT_AMBULATORY_CARE_PROVIDER_SITE_OTHER): Payer: Medicare Other | Admitting: Adult Health

## 2011-09-06 ENCOUNTER — Encounter: Payer: Self-pay | Admitting: Adult Health

## 2011-09-06 VITALS — BP 126/78 | HR 80 | Ht 69.0 in | Wt 245.0 lb

## 2011-09-06 DIAGNOSIS — E785 Hyperlipidemia, unspecified: Secondary | ICD-10-CM

## 2011-09-06 DIAGNOSIS — I10 Essential (primary) hypertension: Secondary | ICD-10-CM

## 2011-09-06 DIAGNOSIS — I2510 Atherosclerotic heart disease of native coronary artery without angina pectoris: Secondary | ICD-10-CM

## 2011-09-06 MED ORDER — NIACIN ER (ANTIHYPERLIPIDEMIC) 500 MG PO TBCR: 500.0000 mg | EXTENDED_RELEASE_TABLET | Freq: Every day | ORAL | Status: DC

## 2011-09-06 NOTE — Progress Notes (Signed)
HPI: Brandon [name] is a 40 y/o patient of Dr.Rothbart we are following for ongoing assessment and treatment of CAD. He was recently admitted to [facility] hospital on Aug 20, 2011 for NSTEMI, with acute in stent thrombosis in RCA and total occlusion of the distal vessel as a result. He was found to have nonobstructive disease otherwise.He had angioplasty of the stent to 10%. Interventionalist chose not to restent this area as the patient already had two layers of stents in the RCA.   He was placed on Effient. Metoprolol was changed to coreg 6. 25 mg BID, lisinopril was added and chorthalidone dose was increased to 25mg  daily.    He has had no recurrence of symptoms but unfortunately restarted smoking after 2 weeks of cessation.  He is watching his diet but has had some weight gain with inactivity. He is medically compliant as medications are provided to him, since he is on disability.  No Known Allergies  Current Outpatient Prescriptions  Medication Sig Dispense Refill  . aspirin 81 MG tablet Take 81 mg by mouth daily.        Marland Kitchen atorvastatin (LIPITOR) 80 MG tablet Take 1 tablet (80 mg total) by mouth daily. Dose increase  30 tablet  8  . carvedilol (COREG) 6.25 MG tablet Take 6.25 mg by mouth 2 (two) times daily with a meal.        . chlorthalidone (HYGROTON) 25 MG tablet Take 0.5 tablets (12.5 mg total) by mouth daily.  15 tablet  8  . lisinopril (PRINIVIL,ZESTRIL) 5 MG tablet Take 5 mg by mouth daily.        . nitroGLYCERIN (NITROSTAT) 0.4 MG SL tablet Place 1 tablet (0.4 mg total) under the tongue every 5 (five) minutes as needed for chest pain.  25 tablet  3  . prasugrel (EFFIENT) 10 MG TABS Take 10 mg by mouth daily.        . niacin (NIASPAN) 500 MG CR tablet Take 1 tablet (500 mg total) by mouth at bedtime.  30 tablet  3   Current Facility-Administered Medications  Medication Dose Route Frequency Provider Last Rate Last Dose  . Influenza (>/= 3 years) inactive virus vaccine (FLVIRIN/FLUZONE)  injection SUSP 0.5 mL  0.5 mL Intramuscular Once Syliva Overman, MD        Past Medical History  Diagnosis Date  . Hyperlipidemia   . Hypertension   . Arteriosclerotic cardiovascular disease (ASCVD) 2006    2006-acute IMI treated with urgent RCA stent; 2007-Cutting Balloon for in-stent restenosis; 02/2010-presented with ACS and minimal troponin elevation:70% LAD, 80% distal circumflex, 80% proximal ramus branch vessel,in-stent restenosis of 70% in the RCA; BMS for proximal critical RCA stenosis  . Tobacco abuse     40 pack years  . History of noncompliance with medical treatment     Due to financial considerations     ZOX:WRUEAV of systems complete and found to be negative unless listed above PHYSICAL EXAM BP 126/78  Pulse 80  Ht 5\' 9"  (1.753 m)  Wt 245 lb (111.131 kg)  BMI 36.18 kg/m2  SpO2 95%  General: Well developed, well nourished, in no acute distress Head: Eyes PERRLA, No xanthomas.   Normal cephalic and atramatic  Lungs: Clear bilaterally to auscultation and percussion. Heart: HRRR S1 S2, without MRG.  Pulses are 2+ & equal.            No carotid bruit. No JVD.  No abdominal bruits. No femoral bruits. Abdomen: Bowel sounds are positive, abdomen soft  and non-tender without masses or                  Hernia's noted. Msk:  Back normal, normal gait. Normal strength and tone for age. Extremities: No clubbing, cyanosis or edema.  DP +1.  No bleeding or hematoma of the right groin site of catheter insertion. Neuro: Alert and oriented X 3. Psych:  Good affect, responds appropriately  EKG:  ASSESSMENT AND PLAN

## 2011-09-06 NOTE — Assessment & Plan Note (Signed)
Currently well controlled on meds. No changes.

## 2011-09-06 NOTE — Assessment & Plan Note (Signed)
Follow up labs in 3 months

## 2011-09-06 NOTE — Assessment & Plan Note (Signed)
Stable at this time. I have cautioned him about returning to smoking in the setting of CAD and frequent interventions of the RCA. He is going to try and quit again. I have added Niacin to his medication regimen for cholesterol control at HS. BMET will be completed to assess kidney fx post cath. Effient samples and coupons will be provided to him.

## 2011-09-06 NOTE — Patient Instructions (Signed)
Your physician recommends that you schedule a follow-up appointment in: 3 months  Your physician recommends that you return for lab work in: [employer])  Your physician has recommended you make the following change in your medication:  START Niaspan 500 mg daily at bedtime with Tylenol  Samples of Effient x 1 month provided

## 2011-09-11 ENCOUNTER — Encounter: Payer: Self-pay | Admitting: *Deleted

## 2011-10-03 ENCOUNTER — Other Ambulatory Visit: Payer: Self-pay | Admitting: Adult Health

## 2011-10-04 LAB — BASIC METABOLIC PANEL
Calcium: 9.6 mg/dL (ref 8.4–10.5)
Chloride: 106 mEq/L (ref 96–112)
Creat: 0.77 mg/dL (ref 0.50–1.35)

## 2011-10-31 ENCOUNTER — Ambulatory Visit (INDEPENDENT_AMBULATORY_CARE_PROVIDER_SITE_OTHER): Payer: Medicare Other | Admitting: Family Medicine

## 2011-10-31 ENCOUNTER — Encounter: Payer: Self-pay | Admitting: Family Medicine

## 2011-10-31 VITALS — BP 102/74 | HR 83 | Resp 16 | Ht 66.0 in | Wt 246.1 lb

## 2011-10-31 DIAGNOSIS — E785 Hyperlipidemia, unspecified: Secondary | ICD-10-CM

## 2011-10-31 DIAGNOSIS — E669 Obesity, unspecified: Secondary | ICD-10-CM

## 2011-10-31 DIAGNOSIS — Z720 Tobacco use: Secondary | ICD-10-CM

## 2011-10-31 DIAGNOSIS — F419 Anxiety disorder, unspecified: Secondary | ICD-10-CM | POA: Insufficient documentation

## 2011-10-31 DIAGNOSIS — I10 Essential (primary) hypertension: Secondary | ICD-10-CM

## 2011-10-31 DIAGNOSIS — F411 Generalized anxiety disorder: Secondary | ICD-10-CM

## 2011-10-31 DIAGNOSIS — F17200 Nicotine dependence, unspecified, uncomplicated: Secondary | ICD-10-CM

## 2011-10-31 DIAGNOSIS — R079 Chest pain, unspecified: Secondary | ICD-10-CM

## 2011-10-31 DIAGNOSIS — I2510 Atherosclerotic heart disease of native coronary artery without angina pectoris: Secondary | ICD-10-CM

## 2011-10-31 MED ORDER — ALPRAZOLAM 0.25 MG PO TABS: ORAL_TABLET | ORAL | Status: DC

## 2011-10-31 NOTE — Patient Instructions (Signed)
Review the low carb foods, incorporate more fruits and vegetables Take the xanax when you have an anxiety attack  Schedule a follow up visit with Dr. Dietrich Pates  Next visit in 3 months Get your labs done before next visit

## 2011-11-01 ENCOUNTER — Encounter: Payer: Self-pay | Admitting: Family Medicine

## 2011-11-01 NOTE — Assessment & Plan Note (Signed)
Well controlled. Labs prior to next visit

## 2011-11-01 NOTE — Assessment & Plan Note (Signed)
Pt chest pain appears to be different from previous anginal episodes however with recent MI will send to cardiology for interval visit. Continue BB

## 2011-11-01 NOTE — Progress Notes (Signed)
  Subjective:    Patient ID: Brandon [name], male    DOB: 10-09-1971, 41 y.o.   MRN: [id number]  HPI Pt here to f/u MI from Nov 2012. S/p cardiac catherization, unable to stent as pt had 2 stents in RCA already . Started on Effient. Medications for htn and cholesterol titrated. Admits to 4 episodes of chest pain, all occuring at rest. He has substernal pain that lasts a few seconds followed by diaphoresis and shortness of breath, he is then very worked up and unable to get back to sleep, he feels anxious and is worried he is having another heart attack. Pain is typically different from his previous heart attacks, NTG does not relieve pain completely. He is unable to calm himself for some time after the episodes   Review of Systems  GEN- denies fatigue, fever, weight loss,weakness, recent illness CVS- + chest pain, palpitations RESP- denies SOB, cough, wheeze ABD- denies N/V, change in stools, abd pain MSK- denies joint pain, muscle aches, injury Neuro- denies headache, dizziness, syncope, seizure activity      Objective:   Physical Exam GEN- NAD, alert and oriented x3 CVS- RRR, no murmur, no gallop RESP-CTAB EXT- No edema Pulses- Radial, DP- 2+ Psych-not depressed appearing, not anxious appearing, normal speech, affect       Assessment & Plan:

## 2011-11-01 NOTE — Assessment & Plan Note (Signed)
Discussed need for tobacco cessation  

## 2011-11-01 NOTE — Assessment & Plan Note (Signed)
Discussed need for weight loss. Pt to start walking program

## 2011-11-01 NOTE — Assessment & Plan Note (Signed)
FLP prior to next visit

## 2011-11-01 NOTE — Assessment & Plan Note (Signed)
Pt experiencing anxiety episodes in setting of recurrent MI and cardiac disease. Will give trial of low dose benzo to be used after episodes and NTG did not completely relieve symptoms

## 2011-11-01 NOTE — Assessment & Plan Note (Signed)
F/u with cardiology, maximize therapy for risk factors

## 2011-12-07 ENCOUNTER — Ambulatory Visit (INDEPENDENT_AMBULATORY_CARE_PROVIDER_SITE_OTHER): Payer: Medicare Other | Admitting: Cardiology

## 2011-12-07 ENCOUNTER — Encounter: Payer: Self-pay | Admitting: Cardiology

## 2011-12-07 DIAGNOSIS — Z91199 Patient's noncompliance with other medical treatment and regimen due to unspecified reason: Secondary | ICD-10-CM

## 2011-12-07 DIAGNOSIS — I10 Essential (primary) hypertension: Secondary | ICD-10-CM

## 2011-12-07 DIAGNOSIS — I2510 Atherosclerotic heart disease of native coronary artery without angina pectoris: Secondary | ICD-10-CM

## 2011-12-07 DIAGNOSIS — E782 Mixed hyperlipidemia: Secondary | ICD-10-CM

## 2011-12-07 DIAGNOSIS — Z79899 Other long term (current) drug therapy: Secondary | ICD-10-CM

## 2011-12-07 DIAGNOSIS — E785 Hyperlipidemia, unspecified: Secondary | ICD-10-CM

## 2011-12-07 DIAGNOSIS — Z9119 Patient's noncompliance with other medical treatment and regimen: Secondary | ICD-10-CM

## 2011-12-07 MED ORDER — AMLODIPINE BESYLATE 5 MG PO TABS: 5.0000 mg | ORAL_TABLET | Freq: Every day | ORAL | Status: DC

## 2011-12-07 MED ORDER — NIACIN 100 MG PO TABS: ORAL_TABLET | ORAL | Status: DC

## 2011-12-07 MED ORDER — CLOPIDOGREL BISULFATE 75 MG PO TABS: 75.0000 mg | ORAL_TABLET | Freq: Every day | ORAL | Status: AC

## 2011-12-07 MED ORDER — CARVEDILOL 25 MG PO TABS: 25.0000 mg | ORAL_TABLET | Freq: Two times a day (BID) | ORAL | Status: DC

## 2011-12-07 NOTE — Assessment & Plan Note (Addendum)
Patient has done well since stent thrombosis 4 months ago.  I am concerned that he will not continue to do well if he remains off dual antiplatelet therapy.  Since he cannot afford prasugrel or ticangrelor, Plavix will be prescribed with adequate platelet inhibition documented by appropriate testing.  I explained the risk of death associated with discontinuation of dual antiplatelet therapy and the willingness of our office to provide medication should no other option be available.    Chest discomfort is probably noncardiac in origin although coronary spasm is a possibility.  Is encouraged to continue to use sublingual nitroglycerin and to increase his dose of Xanax to 0.5 mg should he experience anxiety.

## 2011-12-07 NOTE — Assessment & Plan Note (Addendum)
Lipid profile has been suboptimal whenever it has been assessed.  Patient reports that he is taking atorvastatin as prescribed.  Niacin will be added, initially at a dose of 100 mg 3 times a day and subsequently increased to 500 mg in a stepwise fashion.  Niaspan requires a $90 co-pay, which is not feasible for Brandon [name].

## 2011-12-07 NOTE — Patient Instructions (Addendum)
**Note De-Identified Liane Tribbey Obfuscation** Your physician has recommended you make the following change in your medication: Stop takingLisinopril, Chlorthalidone and Effient. Switch Niaspan to Niacin OTC (start at 100 mg three times daily and increase dose over 5 weeks to 500 mg three times daily, please refer to handout given to you at today's visit) , increase Coreg to 25 mg twice daily, start taking Amlodipine 5 mg daily and Plavix 75 mg daily  Your physician recommends that you return for lab work in: 2 weeks and in 5 weeks  Your physician has requested that you regularly monitor and record your blood pressure readings at home. Please use the same machine at the same time of day to check your readings and record them to bring to your follow-up visit.  Your physician recommends that you schedule a follow-up appointment in: 2 weeks for a blood pressure check and in 2 months with provider

## 2011-12-07 NOTE — Assessment & Plan Note (Signed)
Blood pressure control is good, but medication regime will be changed to provide him with drugs they will both treat his hypertension and his cardiac disease.  Carvedilol will be increased to 25 mg b.i.d.  Amlodipine will be added at 5 mg per day.  Lisinopril and diuretic will be discontinued.

## 2011-12-07 NOTE — Assessment & Plan Note (Addendum)
We determined that the cost to the patient will be $5 per month for Plavix.  He verifies he can afford the cost and that he understands the severe consequences possibly associated with not taking these medications.

## 2011-12-07 NOTE — Progress Notes (Signed)
Patient ID: Brandon [name], male   DOB: 02/08/1971, 41 y.o.   MRN: [id number] HPI: Scheduled return visit for this nice young gentleman with coronary artery disease now 4 months post urgent intervention for stent thrombosis.  In the interim, he has been evaluated by Ms. Lawrence on 2 occasions.  His current drug plan under Medicaid has not covered prasugrel for which he most recently had to pay more than $200 for a 30 day supply.  Since he cannot afford that cost, he has not taken the drug for the past week or 2.  He has experienced a number of episodes of chest discomfort awakening him from sleep after which he developed anxiety and dyspnea.  The chest discomfort itself is relieved with nitroglycerin.  He is able to perform considerable physical activity without any cardiopulmonary symptoms whatsoever.  Prior to Admission medications   Medication Sig Start Date End Date Taking? Authorizing Provider  ALPRAZolam Prudy Feeler) 0.25 MG tablet Take 1 tablet at onset of symptom,may repeat in 4 hours if not resolved 10/31/11  Yes Milinda Antis, MD  aspirin 81 MG tablet Take 81 mg by mouth daily.     Yes Historical Provider, MD  atorvastatin (LIPITOR) 80 MG tablet Take 1 tablet (80 mg total) by mouth daily. Dose increase 06/22/11 06/21/12 Yes Gerrit Friends. Mayuri Staples, MD  carvedilol (COREG) 25 MG tablet Take 1 tablet (25 mg total) by mouth 2 (two) times daily with a meal. 12/07/11  Yes Gerrit Friends. Channin Agustin, MD  nitroGLYCERIN (NITROSTAT) 0.4 MG SL tablet Place 1 tablet (0.4 mg total) under the tongue every 5 (five) minutes as needed for chest pain. 06/22/11 06/21/12 Yes Gerrit Friends. Tyrone Pautsch, MD  amLODipine (NORVASC) 5 MG tablet Take 1 tablet (5 mg total) by mouth daily. 12/07/11 12/06/12  Gerrit Friends. Bonnetta Allbee, MD  clopidogrel (PLAVIX) 75 MG tablet Take 1 tablet (75 mg total) by mouth daily. 12/07/11 12/06/12  Gerrit Friends. Dietrich Pates, MD  niacin 100 MG tablet Titrate from 100 mg three times daily to 500 mg three times daily 12/07/11   Gerrit Friends.  Dietrich Pates, MD   No Known Allergies    Past medical history, social history, and family history reviewed and updated.  ROS: Denies orthopnea, PND, pedal edema, palpitations, lightheadedness or syncope.  PHYSICAL EXAM: BP 137/88  Pulse 97  Ht 5\' 6"  (1.676 m)  Wt 110.224 kg (243 lb)  BMI 39.22 kg/m2  General-Well developed; no acute distress Body habitus-moderately overweight Neck-No JVD; no carotid bruits Lungs-clear lung fields; resonant to percussion Cardiovascular-normal PMI; normal S1 and S2 Abdomen-normal bowel sounds; soft and non-tender without masses or organomegaly Musculoskeletal-No deformities, no cyanosis or clubbing Neurologic-Normal cranial nerves; symmetric strength and tone Skin-Warm, no significant lesions Extremities-distal pulses intact; no edema  ASSESSMENT AND PLAN:  Graford Bing, MD 12/07/2011 4:27 PM

## 2011-12-07 NOTE — Assessment & Plan Note (Signed)
The importance of discontinuing tobacco use was stressed to the patient.  We will continue to focus on smoking cessation in future appointments.

## 2011-12-19 ENCOUNTER — Telehealth: Payer: Self-pay

## 2011-12-21 ENCOUNTER — Ambulatory Visit (INDEPENDENT_AMBULATORY_CARE_PROVIDER_SITE_OTHER): Payer: Medicare Other

## 2011-12-21 VITALS — BP 129/86 | HR 94 | Ht 69.0 in | Wt 238.0 lb

## 2011-12-21 DIAGNOSIS — I10 Essential (primary) hypertension: Secondary | ICD-10-CM

## 2011-12-21 NOTE — Progress Notes (Signed)
S: Pt. arrives in office for a 2 week BP check with nurse. B: On last OV with Dr. Dietrich Pates on 12-07-11 pt. was advised to stop taking Lisinopril, Chlorthalidone and Effient, switch Niaspan to Niacin OTC, increase Coreg to 25 mg bid and to start taking Amlodipine 5 mg daily and Plavix 75 mg daily. Also, pt. Is having P2Y12 drawn tomorrow. A: Pt. has no complaints. His BP today is 129/86 and on last OV BP was 137/88. He brought his medications (states he is taking as directed) and BP diary (a copy is pinned to board at Dr. Marvel Plan desk in nursing station and original sent to be scanned into chart) to this visit. BP diary readings are similar to somewhat greater than today's BP.  R: Pt. Is advised to continue current medical treatment and that we will contact him with Dr. Marvel Plan recommendations./LV

## 2011-12-22 ENCOUNTER — Encounter: Payer: Self-pay | Admitting: Cardiology

## 2011-12-22 ENCOUNTER — Telehealth: Payer: Self-pay

## 2011-12-26 NOTE — Progress Notes (Signed)
Patient ID: Brandon [name], male   DOB: 1971/05/19, 41 y.o.   MRN: [id number]  Agree with plan.

## 2012-01-15 ENCOUNTER — Encounter: Payer: Self-pay | Admitting: Cardiology

## 2012-01-29 ENCOUNTER — Ambulatory Visit: Payer: Medicare Other | Admitting: Family Medicine

## 2012-02-05 ENCOUNTER — Ambulatory Visit: Payer: Medicare Other | Admitting: Cardiology

## 2012-02-26 ENCOUNTER — Ambulatory Visit: Payer: Medicare Other | Admitting: Family Medicine

## 2012-02-28 ENCOUNTER — Ambulatory Visit: Payer: Medicare Other | Admitting: Cardiology

## 2012-02-28 ENCOUNTER — Ambulatory Visit: Payer: Medicare Other | Admitting: Physician Assistant

## 2012-02-28 ENCOUNTER — Encounter: Payer: Self-pay | Admitting: *Deleted

## 2012-02-28 ENCOUNTER — Encounter: Payer: Self-pay | Admitting: Adult Health

## 2012-02-28 ENCOUNTER — Ambulatory Visit (INDEPENDENT_AMBULATORY_CARE_PROVIDER_SITE_OTHER): Payer: Medicare Other | Admitting: Adult Health

## 2012-02-28 VITALS — BP 142/93 | HR 94 | Ht 69.0 in | Wt 238.0 lb

## 2012-02-28 DIAGNOSIS — Z720 Tobacco use: Secondary | ICD-10-CM

## 2012-02-28 DIAGNOSIS — I10 Essential (primary) hypertension: Secondary | ICD-10-CM

## 2012-02-28 DIAGNOSIS — E782 Mixed hyperlipidemia: Secondary | ICD-10-CM

## 2012-02-28 DIAGNOSIS — F17200 Nicotine dependence, unspecified, uncomplicated: Secondary | ICD-10-CM

## 2012-02-28 DIAGNOSIS — E785 Hyperlipidemia, unspecified: Secondary | ICD-10-CM

## 2012-02-28 DIAGNOSIS — I2510 Atherosclerotic heart disease of native coronary artery without angina pectoris: Secondary | ICD-10-CM

## 2012-02-28 NOTE — Progress Notes (Signed)
HPI: Brandon [name] is a 41 y/o patient of Dr. Dietrich Pates we are following for ongoing assessment of CAD s/p PCI RCA secondary to critical in-stent restenosis. He has had recurrent chest pain and problems affording his medications. On last visit with Dr. Dietrich Pates, he was changed to Plavix for assistance in cost to continue DAPT, he was also taken off of lisinopril, started on norvasc and increased dose of coreg to 25 mg BID. Since having these medication changes he has been without complaint, is medically compliant as he can afford the new medications. He has not had any recurrence of his chest discomfort. He unfortunately continues to smoke.   No Known Allergies  Current Outpatient Prescriptions  Medication Sig Dispense Refill  . ALPRAZolam (XANAX) 0.25 MG tablet Take 1 tablet at onset of symptom,may repeat in 4 hours if not resolved  20 tablet  0  . amLODipine (NORVASC) 5 MG tablet Take 1 tablet (5 mg total) by mouth daily.  30 tablet  2  . aspirin 81 MG tablet Take 81 mg by mouth daily.        Marland Kitchen atorvastatin (LIPITOR) 80 MG tablet Take 1 tablet (80 mg total) by mouth daily. Dose increase  30 tablet  8  . carvedilol (COREG) 25 MG tablet Take 1 tablet (25 mg total) by mouth 2 (two) times daily with a meal.  60 tablet  2  . clopidogrel (PLAVIX) 75 MG tablet Take 1 tablet (75 mg total) by mouth daily.  30 tablet  2  . niacin 100 MG tablet Titrate from 100 mg three times daily to 500 mg three times daily  30 tablet  2  . nitroGLYCERIN (NITROSTAT) 0.4 MG SL tablet Place 1 tablet (0.4 mg total) under the tongue every 5 (five) minutes as needed for chest pain.  25 tablet  3   Current Facility-Administered Medications  Medication Dose Route Frequency Provider Last Rate Last Dose  . Influenza (>/= 3 years) inactive virus vaccine (FLVIRIN/FLUZONE) injection SUSP 0.5 mL  0.5 mL Intramuscular Once Kerri Perches, MD        Past Medical History  Diagnosis Date  . Hyperlipidemia   . Hypertension   .  Arteriosclerotic cardiovascular disease (ASCVD) 2006, 2012    2006-acute IMI treated with urgent RCA stent; 2007-Cutting Balloon for in-stent restenosis; 02/2010-presented with ACS and minimal troponin elevation:70% LAD, 80% distal circumflex, 80% proximal ramus branch vessel,in-stent restenosis of 70% in the RCA; BMS for proximal critical RCA stenosis, restenosis Nov 2012  . Tobacco abuse     40 pack years  . History of noncompliance with medical treatment     Due to financial considerations    No past surgical history on file.  WUJ:[id number] of systems complete and found to be negative unless listed above  PHYSICAL EXAM BP 142/93  Pulse 94  Ht 5\' 9"  (1.753 m)  Wt 238 lb (107.956 kg)  BMI 35.15 kg/m2  General: Well developed, well nourished, in no acute distress Head: Eyes PERRLA, No xanthomas.   Normal cephalic and atramatic  Lungs: Clear bilaterally to auscultation and percussion. Heart: HRRR S1 S2, without MRG.  Pulses are 2+ & equal.            No carotid bruit. No JVD.  No abdominal bruits. No femoral bruits. Abdomen: Bowel sounds are positive, abdomen soft and non-tender without masses or                  Hernia's noted. Msk:  Back normal, normal gait. Normal strength and tone for age. Extremities: No clubbing, cyanosis or edema.  DP +1 Neuro: Alert and oriented X 3. Psych:  Good affect, responds appropriately    ASSESSMENT AND PLAN

## 2012-02-28 NOTE — Assessment & Plan Note (Signed)
He is tolerating his atorvastatin without myalgia's. He is due for labs in Sept. Will have these drawn prior to next appointment. He is advised to lose weight and adhere to low cholesterol diet.

## 2012-02-28 NOTE — Assessment & Plan Note (Signed)
Blood pressure is well controlled at present.  No changes.

## 2012-02-28 NOTE — Patient Instructions (Signed)
Your physician recommends that you schedule a follow-up appointment in: 6 months  Your physician recommends that you return for lab work in: 6 months - Have done prior to your follow up visit.  You will receive a letter

## 2012-02-28 NOTE — Assessment & Plan Note (Signed)
Tobacco cessation counseling completed. He is not ready to quit despite his CAD status.

## 2012-02-28 NOTE — Assessment & Plan Note (Signed)
He has done remarkably well now that he has been able to afford his medications with changes implemented per Dr. Dietrich Pates. There have been no recurrences of chest discomfort or issues with bleeding. He will continue this medication regimen and follow with [place] in 6 months.

## 2012-03-04 ENCOUNTER — Encounter: Payer: Self-pay | Admitting: Family Medicine

## 2012-05-28 ENCOUNTER — Encounter (HOSPITAL_COMMUNITY): Payer: Self-pay

## 2012-05-28 ENCOUNTER — Emergency Department (HOSPITAL_COMMUNITY)
Admission: EM | Admit: 2012-05-28 | Discharge: 2012-05-28 | Disposition: A | Discharge status: Home / Self Care | Payer: Medicare Other | Attending: Emergency Medicine | Admitting: Emergency Medicine

## 2012-05-28 DIAGNOSIS — N200 Calculus of kidney: Secondary | ICD-10-CM

## 2012-05-28 DIAGNOSIS — I10 Essential (primary) hypertension: Secondary | ICD-10-CM | POA: Insufficient documentation

## 2012-05-28 DIAGNOSIS — F17200 Nicotine dependence, unspecified, uncomplicated: Secondary | ICD-10-CM | POA: Insufficient documentation

## 2012-05-28 DIAGNOSIS — E785 Hyperlipidemia, unspecified: Secondary | ICD-10-CM | POA: Insufficient documentation

## 2012-05-28 LAB — URINALYSIS, ROUTINE W REFLEX MICROSCOPIC
Bilirubin Urine: NEGATIVE
Ketones, ur: NEGATIVE mg/dL
Nitrite: NEGATIVE
pH: 7.5 (ref 5.0–8.0)

## 2012-05-28 LAB — URINE MICROSCOPIC-ADD ON

## 2012-05-28 MED ORDER — ONDANSETRON HCL 4 MG/2ML IJ SOLN
4.0000 mg | Freq: Once | INTRAMUSCULAR | Status: AC
  Administered 2012-05-28: 4 mg via INTRAVENOUS
  Filled 2012-05-28: qty 2

## 2012-05-28 MED ORDER — KETOROLAC TROMETHAMINE 30 MG/ML IJ SOLN
30.0000 mg | Freq: Once | INTRAMUSCULAR | Status: AC
  Administered 2012-05-28: 30 mg via INTRAVENOUS
  Filled 2012-05-28: qty 1

## 2012-05-28 MED ORDER — PROMETHAZINE HCL 25 MG PO TABS: 25.0000 mg | ORAL_TABLET | Freq: Four times a day (QID) | ORAL | Status: DC | PRN

## 2012-05-28 MED ORDER — TAMSULOSIN HCL 0.4 MG PO CAPS: 0.4000 mg | ORAL_CAPSULE | Freq: Two times a day (BID) | ORAL | Status: DC

## 2012-05-28 MED ORDER — HYDROMORPHONE HCL PF 1 MG/ML IJ SOLN
1.0000 mg | Freq: Once | INTRAMUSCULAR | Status: AC
  Administered 2012-05-28: 1 mg via INTRAVENOUS
  Filled 2012-05-28: qty 1

## 2012-05-28 MED ORDER — MELOXICAM 7.5 MG PO TABS: 15.0000 mg | ORAL_TABLET | Freq: Every day | ORAL | Status: DC

## 2012-05-28 MED ORDER — HYDROCODONE-ACETAMINOPHEN 5-500 MG PO TABS: 1.0000 | ORAL_TABLET | Freq: Four times a day (QID) | ORAL | Status: AC | PRN

## 2012-05-28 NOTE — ED Notes (Signed)
Discharge instructions reviewed with pt, questions answered. Pt verbalized understanding.  

## 2012-05-28 NOTE — ED Provider Notes (Signed)
History     CSN: [id number]  Arrival date & time 05/28/12  0216   First MD Initiated Contact with Patient 05/28/12 [phone]      Chief Complaint  Patient presents with  . 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  He has associated change in the color of the urine, nasuea and diaphoresis but no fever, diarrhea, rash, swelling, sob, cp, cough, headache.    Patient is a 41 y.o. male presenting with flank pain. The history is provided by the patient.  Flank Pain    Past Medical History  Diagnosis Date  . Hyperlipidemia   . Hypertension   . Arteriosclerotic cardiovascular disease (ASCVD) 2006, 2012    2006-acute IMI treated with urgent RCA stent; 2007-[place] for in-stent restenosis; 02/2010-presented with ACS and minimal troponin elevation:70% LAD, 80% distal circumflex, 80% proximal ramus branch vessel,in-stent restenosis of 70% in the RCA; BMS for proximal critical RCA stenosis, restenosis Nov 2012  . Tobacco abuse     40 pack years  . History of noncompliance with medical treatment     Due to financial considerations    Past Surgical History  Procedure Date  . Coronary angioplasty with stent placement     Family History  Problem Relation Age of Onset  . Depression Mother 72    Suicide  . Heart disease Father 57    Deceased from massive heart-attack  . Hyperlipidemia Father   . Hypertension Father   . COPD Maternal Grandfather   . Cancer Paternal Grandmother     Male Cancer  . Heart disease Paternal Grandmother     History  Substance Use Topics  . Smoking status: Current Everyday Smoker -- 1.0 packs/day for 27 years  . Smokeless tobacco: Never Used  . Alcohol  Use: No      Alcoholism- quit 2002      Review of Systems  Genitourinary: Positive for flank pain.  All other systems reviewed and are negative.    Allergies  Review of patient's allergies indicates no known allergies.  Home Medications   Current Outpatient Rx  Name Route Sig Dispense Refill  . AMLODIPINE BESYLATE 5 MG PO TABS Oral Take 1 tablet (5 mg total) by mouth daily. 30 tablet 2  . ASPIRIN 81 MG PO TABS Oral Take 81 mg by mouth daily.      . ATORVASTATIN CALCIUM 80 MG PO TABS Oral Take 1 tablet (80 mg total) by mouth daily. Dose increase 30 tablet 8  . CARVEDILOL 25 MG PO TABS Oral Take 1 tablet (25 mg total) by mouth 2 (two) times daily with a meal. 60 tablet 2  . CLOPIDOGREL BISULFATE 75 MG PO TABS Oral Take 1 tablet (75 mg total) by mouth daily. 30 tablet 2  . NIACIN 100 MG PO TABS  Titrate from 100 mg three times daily to 500 mg three times daily 30 tablet 2  . NITROGLYCERIN 0.4 MG SL SUBL Sublingual Place 1 tablet (0.4 mg total) under the tongue every 5 (five) minutes as needed for chest pain. 25 tablet 3  . ALPRAZOLAM 0.25 MG PO TABS  Take 1 tablet at onset of symptom,may repeat in 4 hours if not resolved 20  tablet 0  . HYDROCODONE-ACETAMINOPHEN 5-500 MG PO TABS Oral Take 1-2 tablets by mouth every 6 (six) hours as needed for pain. 15 tablet 0  . MELOXICAM 7.5 MG PO TABS Oral Take 2 tablets (15 mg total) by mouth daily. 30 tablet 0  . PROMETHAZINE HCL 25 MG PO TABS Oral Take 1 tablet (25 mg total) by mouth every 6 (six) hours as needed for nausea. 12 tablet 0  . TAMSULOSIN HCL 0.4 MG PO CAPS Oral Take 1 capsule (0.4 mg total) by mouth 2 (two) times daily. 10 capsule 0    BP 169/99  Pulse 80  Temp 98.3 F (36.8 C) (Oral)  Resp 20  Ht 5\' 9"  (1.753 m)  Wt 235 lb (106.595 kg)  BMI 34.70 kg/m2  SpO2 97%  Physical Exam  Nursing note and vitals reviewed. Constitutional: He appears well-developed and well-nourished.       Uncomfortable appearing  HENT:  Head:  Normocephalic and atraumatic.  Eyes: Conjunctivae are normal. No scleral icterus.  Cardiovascular: Normal rate, regular rhythm, normal heart sounds and intact distal pulses.   Pulmonary/Chest: Effort normal and breath sounds normal. No respiratory distress. He has no wheezes. He has no rales.  Abdominal: Soft. There is no tenderness.       No reproducible ttp of the abd, has + CVA ttp in the R flank.  Musculoskeletal: Normal range of motion. He exhibits no edema and no tenderness.  Neurological: He is alert.       Normal gait and coordination  Skin: Skin is warm and dry. No rash noted. No erythema.    ED Course  Procedures (including critical care time)  Labs Reviewed  URINALYSIS, ROUTINE W REFLEX MICROSCOPIC - Abnormal; Notable for the following:    Color, Urine STRAW (*)     APPearance CLOUDY (*)     Hgb urine dipstick LARGE (*)     All other components within normal limits  URINE MICROSCOPIC-ADD ON - Abnormal; Notable for the following:    Bacteria, UA MANY (*)     Casts HYALINE CASTS (*)  GRANULAR CAST   All other components within normal limits  URINE CULTURE   No results found.   1. 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  Pain control at t his time, hydromorphone and Toradol ordered. VS show pulse of 80, pressure of 169/99 and no fever or hypoxia.   The patient feels much better after receiving intravenous hydromorphone and ketorolac, no nausea, no pain at this time. Urinalysis reveals hematuria, appears contaminated but has presence of bacteria. He has no dysuria and his symptoms were more consistent with kidney stone than infection. He has no fever or tachycardia to suggest that he has both illnesses. Urine culture sent, patient informed that he would be informed of his results. He will followup with urology as planned,  he will return for worsening symptoms.  The pt was given opiate type medications while in the emergency dept.  The patient was instructed on the possible side effects and potential allergic reactions associated with said medications and agreed to their use.  I also instructed the patient not to perform certain activities after use of these medications such as driving a vehicle and performing child care.  They were instructed not to ingest alcohol or other medications  that may cause excessive sleepiness, tranquilizers or CNS depressant medications.  They have expressed their understanding.  If the pt was given opiate medications for home by prescription they were reminded of these precautions as well.  Discharge Prescriptions include:  Flomax Vicodin Phenergan Mobic        Vida Roller, MD 05/28/12 [phone]

## 2012-05-28 NOTE — ED Notes (Signed)
Right lower abd and right flank pain that started 11 am on Monday, nausea, vomiting, no diarrhea

## 2012-05-29 LAB — URINE CULTURE
Colony Count: NO GROWTH
Culture: NO GROWTH

## 2012-08-23 ENCOUNTER — Other Ambulatory Visit: Payer: Self-pay | Admitting: *Deleted

## 2012-08-23 DIAGNOSIS — E782 Mixed hyperlipidemia: Secondary | ICD-10-CM

## 2012-09-05 ENCOUNTER — Encounter: Payer: Self-pay | Admitting: *Deleted

## 2012-12-10 ENCOUNTER — Other Ambulatory Visit: Payer: Self-pay | Admitting: Adult Health

## 2012-12-11 LAB — HEPATIC FUNCTION PANEL
ALT: 27 U/L (ref 0–53)
AST: 21 U/L (ref 0–37)
Alkaline Phosphatase: 71 U/L (ref 39–117)
Indirect Bilirubin: 0.7 mg/dL (ref 0.0–0.9)
Total Protein: 6.8 g/dL (ref 6.0–8.3)

## 2012-12-11 LAB — LIPID PANEL
Cholesterol: 296 mg/dL — ABNORMAL HIGH (ref 0–200)
LDL Cholesterol: 195 mg/dL — ABNORMAL HIGH (ref 0–99)
Triglycerides: 329 mg/dL — ABNORMAL HIGH (ref <150)

## 2012-12-16 MED ORDER — CARVEDILOL 25 MG PO TABS: 25.0000 mg | ORAL_TABLET | Freq: Two times a day (BID) | ORAL | Status: DC

## 2012-12-16 MED ORDER — ATORVASTATIN CALCIUM 80 MG PO TABS: 80.0000 mg | ORAL_TABLET | Freq: Every day | ORAL | Status: DC

## 2012-12-16 NOTE — Addendum Note (Signed)
Addended by: Derry Lory A on: 12/16/2012 03:57 PM   Modules accepted: Orders

## 2012-12-23 ENCOUNTER — Encounter: Payer: Self-pay | Admitting: Cardiology

## 2012-12-23 ENCOUNTER — Other Ambulatory Visit: Payer: Self-pay | Admitting: Cardiology

## 2012-12-23 ENCOUNTER — Ambulatory Visit (INDEPENDENT_AMBULATORY_CARE_PROVIDER_SITE_OTHER): Payer: Medicare Other | Admitting: Cardiology

## 2012-12-23 VITALS — BP 140/90 | HR 85 | Ht 69.0 in | Wt 239.0 lb

## 2012-12-23 DIAGNOSIS — Z9119 Patient's noncompliance with other medical treatment and regimen: Secondary | ICD-10-CM

## 2012-12-23 DIAGNOSIS — Z720 Tobacco use: Secondary | ICD-10-CM

## 2012-12-23 DIAGNOSIS — E785 Hyperlipidemia, unspecified: Secondary | ICD-10-CM

## 2012-12-23 DIAGNOSIS — I10 Essential (primary) hypertension: Secondary | ICD-10-CM

## 2012-12-23 DIAGNOSIS — I7090 Unspecified atherosclerosis: Secondary | ICD-10-CM

## 2012-12-23 DIAGNOSIS — I2510 Atherosclerotic heart disease of native coronary artery without angina pectoris: Secondary | ICD-10-CM

## 2012-12-23 DIAGNOSIS — F17200 Nicotine dependence, unspecified, uncomplicated: Secondary | ICD-10-CM

## 2012-12-23 DIAGNOSIS — Z91199 Patient's noncompliance with other medical treatment and regimen due to unspecified reason: Secondary | ICD-10-CM

## 2012-12-23 MED ORDER — CLOPIDOGREL BISULFATE 75 MG PO TABS: 75.0000 mg | ORAL_TABLET | Freq: Every day | ORAL | Status: DC

## 2012-12-23 MED ORDER — SILDENAFIL CITRATE 50 MG PO TABS: 25.0000 mg | ORAL_TABLET | ORAL | Status: DC | PRN

## 2012-12-23 NOTE — Assessment & Plan Note (Signed)
Patient is fatalistic with respect to cigarette smoking. I have referred him to a smoking cessation program.

## 2012-12-23 NOTE — Assessment & Plan Note (Signed)
Patient reports an interval when he was not taking his medications as prescribed, but more recently, systolics have always been less than 140 and diastolics less than 90. He'll continue to monitor this at home. No modification of antihypertensive medical regime appears to be required

## 2012-12-23 NOTE — Progress Notes (Signed)
Patient ID: Brandon [name], male   DOB: 12-08-1970, 42 y.o.   MRN: [id number]  HPI: Schedule return visit for nice but somewhat noncompliant gentleman recently returning to this area after visiting family for a number of months. While out of town, he exhausted his supply of clopidogrel and made no attempt to obtain additional medication. Despite this, he has been free of cardiopulmonary symptoms. He is capable of moderately intense activity without difficulty.  He continues to smoke one pack of cigarettes per day and has never made a significant quit attempt. He claims to be addicted.   Current Outpatient Prescriptions  Medication Sig Dispense Refill  . amLODipine (NORVASC) 5 MG tablet Take 5 mg by mouth daily.      Marland Kitchen aspirin 81 MG tablet Take 81 mg by mouth daily.        Marland Kitchen atorvastatin (LIPITOR) 80 MG tablet Take 1 tablet (80 mg total) by mouth daily.  30 tablet  0  . carvedilol (COREG) 25 MG tablet Take 1 tablet (25 mg total) by mouth 2 (two) times daily with a meal.  60 tablet  0  . niacin 100 MG tablet Titrate from 100 mg three times daily to 500 mg three times daily  30 tablet  2  . nitroGLYCERIN (NITROSTAT) 0.4 MG SL tablet Place 1 tablet (0.4 mg total) under the tongue every 5 (five) minutes as needed for chest pain.  25 tablet  3  . promethazine (PHENERGAN) 25 MG tablet Take 1 tablet (25 mg total) by mouth every 6 (six) hours as needed for nausea.  12 tablet  0  . clopidogrel (PLAVIX) 75 MG tablet Take 1 tablet (75 mg total) by mouth daily.  90 tablet  3  . sildenafil (VIAGRA) 50 MG tablet Take 0.5 tablets (25 mg total) by mouth as needed for erectile dysfunction.  5 tablet  11   Current Facility-Administered Medications  Medication Dose Route Frequency Alysiah Suppa Last Rate Last Dose  . Influenza (>/= 3 years) inactive virus vaccine (FLVIRIN/FLUZONE) injection SUSP 0.5 mL  0.5 mL Intramuscular Once Kerri Perches, MD       No Known Allergies   Past medical history, social history, and  family history reviewed and updated.  ROS: Denies chest pain, dyspnea, orthopnea, pedal edema, lightheadedness or syncope. All other systems reviewed and are negative.  PHYSICAL EXAM: BP 140/90  Pulse 85  Ht 5\' 9"  (1.753 m)  Wt 108.41 kg (239 lb)  BMI 35.28 kg/m2  SpO2 91%;  Body mass index is 35.28 kg/(m^2). General-Well developed; no acute distress Body habitus-mildly to moderately overweight Neck-No JVD; no carotid bruits Lungs-clear lung fields; resonant to percussion Cardiovascular-normal PMI; normal S1 and S2 Abdomen-normal bowel sounds; soft and non-tender without masses or organomegaly Musculoskeletal-No deformities, no cyanosis or clubbing Neurologic-Normal cranial nerves; symmetric strength and tone Skin-Warm, no significant lesions Extremities-distal pulses intact; no edema  [name], MD 12/23/2012  3:40 PM  ASSESSMENT AND PLAN

## 2012-12-23 NOTE — Progress Notes (Deleted)
Name: Brandon [name]    DOB: Apr 27, 1971  Age: 42 y.o.  MR#: [id number]       PCP:  Syliva Overman, MD      Insurance: Payor: MEDICARE  Plan: MEDICARE PART A AND B  Product Type: *No Product type*    CC:   No chief complaint on file.  MEDICATION LIST LABS NOT DONE  VS Filed Vitals:   12/23/12 1332  BP: 140/90  Pulse: 85  Height: 5\' 9"  (1.753 m)  Weight: 239 lb (108.41 kg)  SpO2: 91%    Weights Current Weight  12/23/12 239 lb (108.41 kg)  05/28/12 235 lb (106.595 kg)  02/28/12 238 lb (107.956 kg)    Blood Pressure  BP Readings from Last 3 Encounters:  12/23/12 140/90  05/28/12 169/99  02/28/12 142/93     Admit date:  (Not on file) Last encounter with RMR:  12/23/2012   Allergy Review of patient's allergies indicates no known allergies.  Current Outpatient Prescriptions  Medication Sig Dispense Refill  . amLODipine (NORVASC) 5 MG tablet Take 5 mg by mouth daily.      Marland Kitchen aspirin 81 MG tablet Take 81 mg by mouth daily.        Marland Kitchen atorvastatin (LIPITOR) 80 MG tablet Take 1 tablet (80 mg total) by mouth daily.  30 tablet  0  . carvedilol (COREG) 25 MG tablet Take 1 tablet (25 mg total) by mouth 2 (two) times daily with a meal.  60 tablet  0  . niacin 100 MG tablet Titrate from 100 mg three times daily to 500 mg three times daily  30 tablet  2  . nitroGLYCERIN (NITROSTAT) 0.4 MG SL tablet Place 1 tablet (0.4 mg total) under the tongue every 5 (five) minutes as needed for chest pain.  25 tablet  3  . promethazine (PHENERGAN) 25 MG tablet Take 1 tablet (25 mg total) by mouth every 6 (six) hours as needed for nausea.  12 tablet  0  . VIAGRA 50 MG tablet TAKE ONE-HALF TABLET BY MOUTH ONCE A DAYAS NEEDED FOR ERECTILE DYSFUNCTION  5 tablet  6   Current Facility-Administered Medications  Medication Dose Route Frequency Jannel Lynne Last Rate Last Dose  . Influenza (>/= 3 years) inactive virus vaccine (FLVIRIN/FLUZONE) injection SUSP 0.5 mL  0.5 mL Intramuscular Once Kerri Perches, MD         Discontinued Meds:    Medications Discontinued During This Encounter  Medication Reason  . ALPRAZolam (XANAX) 0.25 MG tablet Error  . amLODipine (NORVASC) 5 MG tablet Error  . Tamsulosin HCl (FLOMAX) 0.4 MG CAPS Error  . meloxicam (MOBIC) 7.5 MG tablet Error    Patient Active Problem List  Diagnosis  . Hyperlipidemia  . Hypertension  . Obesity  . Arteriosclerotic cardiovascular disease (ASCVD)  . Tobacco abuse  . History of noncompliance with medical treatment  . Anxiety    LABS    Component Value Date/Time   NA 140 10/03/2011 1515   NA 138 08/22/2011 0550   NA 135 08/21/2011 0221   K 4.2 10/03/2011 1515   K 3.7 08/22/2011 0550   K 4.0 08/21/2011 0221   CL 106 10/03/2011 1515   CL 102 08/22/2011 0550   CL 100 08/21/2011 0221   CO2 27 10/03/2011 1515   CO2 26 08/22/2011 0550   CO2 25 08/21/2011 0221   GLUCOSE 112* 10/03/2011 1515   GLUCOSE 96 08/22/2011 0550   GLUCOSE 115* 08/21/2011 0221   BUN 9 10/03/2011  1515   BUN 11 08/22/2011 0550   BUN 13 08/21/2011 0221   CREATININE 0.77 10/03/2011 1515   CREATININE 0.83 08/22/2011 0550   CREATININE 0.79 08/21/2011 0221   CREATININE 0.90 08/20/2011 2009   CREATININE 0.80 05/30/2011 1038   CALCIUM 9.6 10/03/2011 1515   CALCIUM 9.5 08/22/2011 0550   CALCIUM 9.3 08/21/2011 0221   GFRNONAA >90 08/22/2011 0550   GFRNONAA >90 08/21/2011 0221   GFRNONAA >90 08/20/2011 1920   GFRAA >90 08/22/2011 0550   GFRAA >90 08/21/2011 0221   GFRAA >90 08/20/2011 1920   CMP     Component Value Date/Time   NA 140 10/03/2011 1515   K 4.2 10/03/2011 1515   CL 106 10/03/2011 1515   CO2 27 10/03/2011 1515   GLUCOSE 112* 10/03/2011 1515   BUN 9 10/03/2011 1515   CREATININE 0.77 10/03/2011 1515   CREATININE 0.83 08/22/2011 0550   CALCIUM 9.6 10/03/2011 1515   PROT 6.8 12/10/2012 1605   ALBUMIN 4.4 12/10/2012 1605   AST 21 12/10/2012 1605   ALT 27 12/10/2012 1605   ALKPHOS 71 12/10/2012 1605   BILITOT 0.8 12/10/2012 1605    GFRNONAA >90 08/22/2011 0550   GFRAA >90 08/22/2011 0550       Component Value Date/Time   WBC 9.5 08/22/2011 0550   WBC 14.3* 08/21/2011 0221   WBC 13.5* 08/20/2011 1920   HGB 14.9 08/22/2011 0550   HGB 15.5 08/21/2011 0221   HGB 14.6 08/20/2011 2009   HCT 41.9 08/22/2011 0550   HCT 44.4 08/21/2011 0221   HCT 43.0 08/20/2011 2009   MCV 90.9 08/22/2011 0550   MCV 91.2 08/21/2011 0221   MCV 89.1 08/20/2011 1920    Lipid Panel     Component Value Date/Time   CHOL 296* 12/10/2012 1605   TRIG 329* 12/10/2012 1605   HDL 35* 12/10/2012 1605   CHOLHDL 8.5 12/10/2012 1605   VLDL 66* 12/10/2012 1605   LDLCALC 195* 12/10/2012 1605    ABG    Component Value Date/Time   TCO2 22 08/20/2011 2009     Lab Results  Component Value Date   TSH 2.237 ***Test methodology is 3rd generation TSH**** 02/24/2010   BNP (last 3 results) No results found for this basename: PROBNP,  in the last 8760 hours Cardiac Panel (last 3 results) No results found for this basename: CKTOTAL, CKMB, TROPONINI, RELINDX,  in the last 72 hours  Iron/TIBC/Ferritin No results found for this basename: iron, tibc, ferritin     EKG Orders placed during the hospital encounter of 08/20/11  . EKG  . EKG     Prior Assessment and Plan Problem List as of 12/23/2012     ICD-9-CM     Cardiology Problems   Hyperlipidemia   Last Assessment & Plan   02/28/2012 Office Visit Written 02/28/2012  2:22 PM by Jodelle Gross, NP     He is tolerating his atorvastatin without myalgia's. He is due for labs in Sept. Will have these drawn prior to next appointment. He is advised to lose weight and adhere to low cholesterol diet.    Hypertension   Last Assessment & Plan   02/28/2012 Office Visit Written 02/28/2012  2:21 PM by Jodelle Gross, NP     Blood pressure is well controlled at present.  No changes.    Arteriosclerotic cardiovascular disease (ASCVD)   Last Assessment & Plan   02/28/2012 Office Visit Written 02/28/2012  2:20 PM  by Jodelle Gross, NP  He has done remarkably well now that he has been able to afford his medications with changes implemented per Dr. Dietrich Pates. There have been no recurrences of chest discomfort or issues with bleeding. He will continue this medication regimen and follow with [place] in 6 months.      Other   Obesity   Last Assessment & Plan   10/31/2011 Office Visit Written 11/01/2011  9:43 PM by Salley Scarlet, MD     Discussed need for weight loss. Pt to start walking program    Tobacco abuse   Last Assessment & Plan   02/28/2012 Office Visit Written 02/28/2012  2:23 PM by Jodelle Gross, NP     Tobacco cessation counseling completed. He is not ready to quit despite his CAD status.      History of noncompliance with medical treatment   Last Assessment & Plan   12/07/2011 Office Visit Edited 12/10/2011 10:57 AM by Kathlen Brunswick, MD     We determined that the cost to the patient will be $5 per month for Plavix.  He verifies he can afford the cost and that he understands the severe consequences possibly associated with not taking these medications.    Anxiety   Last Assessment & Plan   10/31/2011 Office Visit Written 11/01/2011  9:36 PM by Salley Scarlet, MD     Pt experiencing anxiety episodes in setting of recurrent MI and cardiac disease. Will give trial of low dose benzo to be used after episodes and NTG did not completely relieve symptoms        Imaging: No results found.

## 2012-12-23 NOTE — Assessment & Plan Note (Signed)
We have encouraged patient to call whenever he experiences difficulty in managing or continuing his medications.

## 2012-12-23 NOTE — Assessment & Plan Note (Addendum)
High-dose atorvastatin has been started; repeat lipid profile will be obtained.

## 2012-12-23 NOTE — Assessment & Plan Note (Signed)
No symptoms at present to suggest myocardial ischemia. The potential for myocardial infarction and death to occur should he suffer another stent thrombosis was explained. He is willing to resume treatment with clopidogrel, which will be ordered.

## 2012-12-23 NOTE — Patient Instructions (Addendum)
Your physician recommends that you schedule a follow-up appointment in: 4 months  Your physician has recommended you make the following change in your medication:  1 - START Plavix 75 mg daily  Your physician has requested that you regularly monitor and record your blood pressure readings at home. Please use the same machine at the same time of day to check your readings and record them to bring to your follow-up visit.  You will be referred to smoking cessation class at [facility].  In the meantime you may use nicotine patch to assist with smoking cessation.

## 2012-12-26 ENCOUNTER — Encounter: Payer: Self-pay | Admitting: Family Medicine

## 2012-12-26 ENCOUNTER — Ambulatory Visit (INDEPENDENT_AMBULATORY_CARE_PROVIDER_SITE_OTHER): Payer: Medicare Other | Admitting: Family Medicine

## 2012-12-26 ENCOUNTER — Other Ambulatory Visit: Payer: Self-pay

## 2012-12-26 VITALS — BP 118/80 | HR 83 | Resp 16 | Wt 235.0 lb

## 2012-12-26 DIAGNOSIS — F419 Anxiety disorder, unspecified: Secondary | ICD-10-CM

## 2012-12-26 DIAGNOSIS — F3289 Other specified depressive episodes: Secondary | ICD-10-CM

## 2012-12-26 DIAGNOSIS — R7309 Other abnormal glucose: Secondary | ICD-10-CM

## 2012-12-26 DIAGNOSIS — E669 Obesity, unspecified: Secondary | ICD-10-CM

## 2012-12-26 DIAGNOSIS — F32A Depression, unspecified: Secondary | ICD-10-CM

## 2012-12-26 DIAGNOSIS — R739 Hyperglycemia, unspecified: Secondary | ICD-10-CM

## 2012-12-26 DIAGNOSIS — F411 Generalized anxiety disorder: Secondary | ICD-10-CM

## 2012-12-26 DIAGNOSIS — G4730 Sleep apnea, unspecified: Secondary | ICD-10-CM

## 2012-12-26 DIAGNOSIS — I10 Essential (primary) hypertension: Secondary | ICD-10-CM

## 2012-12-26 DIAGNOSIS — F329 Major depressive disorder, single episode, unspecified: Secondary | ICD-10-CM

## 2012-12-26 MED ORDER — VENLAFAXINE HCL 37.5 MG PO TABS: 37.5000 mg | ORAL_TABLET | Freq: Two times a day (BID) | ORAL | Status: DC

## 2012-12-26 MED ORDER — CLONAZEPAM 1 MG PO TABS: 1.0000 mg | ORAL_TABLET | Freq: Two times a day (BID) | ORAL | Status: DC | PRN

## 2012-12-26 MED ORDER — FLUOXETINE HCL 10 MG PO CAPS: 10.0000 mg | ORAL_CAPSULE | Freq: Every day | ORAL | Status: DC

## 2012-12-26 NOTE — Progress Notes (Signed)
  Subjective:    Patient ID: Brandon [name], male    DOB: [birth year], 42 y.o.   MRN: [id number]  HPI  Patient here to follow chronic medical problems he's been lost to followup because he moved to take care of his son who had an orthopedic injury in [place]. He has followup with his heart Dr. He states that he's been feeling depressed as well as having panic attacks and anxiety which is worsened over the past few months. He currently lives with his ex-wife as well as their children one of whom has special needs and his new girlfriend. Some nights he finds himself in the bed crying other times he is very angry. He continues to have episodes where he make wakes up in the middle night short of breath feels like he needs to catch his breath he thinks he is panicking but is not sure. He is often tired during the day and naps a lot during the day. He did have some Xanax left over from her first visit a year ago but these have not helped very much.  Coronary artery disease followed by cardiology he was restarted on his medications recently without any concerns.  Review of Systems  GEN- denies fatigue, fever, weight loss,weakness, recent illness HEENT- denies eye drainage, change in vision, nasal discharge, CVS- denies chest pain, palpitations RESP- denies SOB, cough, wheeze ABD- denies N/V, change in stools, abd pain GU- denies dysuria, hematuria, dribbling, incontinence MSK- denies joint pain, muscle aches, injury Neuro- denies headache, dizziness, syncope, seizure activity      Objective:   Physical Exam  GEN- NAD, alert and oriented x3, obesity HEENT- PERRL, EOMI, non injected sclera, pink conjunctiva, MMM, oropharynx clear Neck- Supple, large neck CVS- RRR, no murmur RESP-CTAB EXT- No edema Pulses- Radial, DP- 2+ Psych- Normal affect and mood, no SI, no hallucinations, very polite      Assessment & Plan:

## 2012-12-26 NOTE — Patient Instructions (Signed)
Start the prozac once a day  Use the klonopin as needed for panic attacks Sleep study to be set up- Neurologist  Continue all other medication F/U 4 weeks

## 2012-12-29 DIAGNOSIS — G4730 Sleep apnea, unspecified: Secondary | ICD-10-CM | POA: Insufficient documentation

## 2012-12-29 DIAGNOSIS — F325 Major depressive disorder, single episode, in full remission: Secondary | ICD-10-CM | POA: Insufficient documentation

## 2012-12-29 NOTE — Assessment & Plan Note (Signed)
Initially sent prozac but this interferes with plavix per pharmacy changed to effexor 37.5mg  BID Klonopin for panic attack symptoms as a bridge

## 2012-12-29 NOTE — Assessment & Plan Note (Signed)
Concern for  OSA, will send to neurology for sleep study

## 2012-12-29 NOTE — Assessment & Plan Note (Signed)
He is only on Coreg, did not have norvasc filled, BP looks good today on just BB, will recheck upon intermin visit to see if norvasc needed

## 2012-12-29 NOTE — Assessment & Plan Note (Signed)
Discussed weight and need for lost, possible OSA

## 2012-12-29 NOTE — Assessment & Plan Note (Signed)
effexor per above, klonopin bridge

## 2013-01-23 ENCOUNTER — Ambulatory Visit: Payer: Medicare Other | Admitting: Family Medicine

## 2016-02-25 ENCOUNTER — Emergency Department
Admission: EM | Admit: 2016-02-25 | Discharge: 2016-02-25 | Disposition: A | Discharge status: Home / Self Care | Payer: Medicare Other | Attending: Emergency Medicine | Admitting: Emergency Medicine

## 2016-02-25 DIAGNOSIS — E785 Hyperlipidemia, unspecified: Secondary | ICD-10-CM | POA: Insufficient documentation

## 2016-02-25 DIAGNOSIS — Z8679 Personal history of other diseases of the circulatory system: Secondary | ICD-10-CM | POA: Diagnosis not present

## 2016-02-25 DIAGNOSIS — E669 Obesity, unspecified: Secondary | ICD-10-CM | POA: Insufficient documentation

## 2016-02-25 DIAGNOSIS — Z7982 Long term (current) use of aspirin: Secondary | ICD-10-CM | POA: Insufficient documentation

## 2016-02-25 DIAGNOSIS — I10 Essential (primary) hypertension: Secondary | ICD-10-CM | POA: Insufficient documentation

## 2016-02-25 DIAGNOSIS — F329 Major depressive disorder, single episode, unspecified: Secondary | ICD-10-CM | POA: Insufficient documentation

## 2016-02-25 DIAGNOSIS — F17200 Nicotine dependence, unspecified, uncomplicated: Secondary | ICD-10-CM | POA: Insufficient documentation

## 2016-02-25 DIAGNOSIS — B029 Zoster without complications: Secondary | ICD-10-CM

## 2016-02-25 DIAGNOSIS — R21 Rash and other nonspecific skin eruption: Secondary | ICD-10-CM | POA: Diagnosis present

## 2016-02-25 DIAGNOSIS — Z79899 Other long term (current) drug therapy: Secondary | ICD-10-CM | POA: Insufficient documentation

## 2016-02-25 MED ORDER — OXYCODONE-ACETAMINOPHEN 5-325 MG PO TABS: 1.0000 | ORAL_TABLET | Freq: Four times a day (QID) | ORAL | Status: DC | PRN

## 2016-02-25 MED ORDER — ACYCLOVIR 800 MG PO TABS: 800.0000 mg | ORAL_TABLET | Freq: Every day | ORAL | Status: DC

## 2016-02-25 NOTE — ED Provider Notes (Signed)
[facility] Emergency Department Provider Note  ____________________________________________  Time seen: Approximately 7:24 PM  I have reviewed the triage vital signs and the nursing notes.   HISTORY  Chief Complaint Herpes Zoster    HPI Brandon [name] is a 45 y.o. male who presents emergency Department with a rash to his right side. Rash is a linear appearance and is in dermatomal distribution.Area is erythematous with open lesions, and is both pleuritic and a burning/painful sensation. Patient is unsure whether he had chickenpox as a child. Patient states that he has been taking ibuprofen, antibiotic topical ointment, and Benadryl with no relief. Patient denies any facial, ocular, or oral involvement. No other complaints at this time.   Past Medical History  Diagnosis Date  . Hyperlipidemia   . Hypertension   . Arteriosclerotic cardiovascular disease (ASCVD) 2006, 2012    2006-acute IMI treated with urgent RCA stent; 2007-Cutting Balloon for in-stent restenosis; 02/2010-presented with ACS and minimal troponin elevation:70% LAD, 80% distal circumflex, 80% proximal ramus branch vessel,in-stent restenosis of 70% in the RCA; BMS for proximal critical RCA stenosis, restenosis Nov 2012  . Tobacco abuse     40 pack years  . History of noncompliance with medical treatment     Due to financial considerations    Patient Active Problem List   Diagnosis Date Noted  . Depression 12/29/2012  . Unspecified sleep apnea 12/29/2012  . Anxiety 10/31/2011  . Arteriosclerotic cardiovascular disease (ASCVD)   . Tobacco abuse   . History of noncompliance with medical treatment   . Hyperlipidemia 05/30/2011  . Hypertension 05/30/2011  . Obesity 05/30/2011    Past Surgical History  Procedure Laterality Date  . Coronary angioplasty with stent placement      Current Outpatient Rx  Name  Route  Sig  Dispense  Refill  . acyclovir (ZOVIRAX) 800 MG tablet   Oral   Take  1 tablet (800 mg total) by mouth 5 (five) times daily.   35 tablet   0   . aspirin 81 MG tablet   Oral   Take 81 mg by mouth daily.           Marland Kitchen atorvastatin (LIPITOR) 80 MG tablet   Oral   Take 1 tablet (80 mg total) by mouth daily.   30 tablet   0   . carvedilol (COREG) 25 MG tablet   Oral   Take 1 tablet (25 mg total) by mouth 2 (two) times daily with a meal.   60 tablet   0   . EXPIRED: clonazePAM (KLONOPIN) 1 MG tablet   Oral   Take 1 tablet (1 mg total) by mouth 2 (two) times daily as needed for anxiety.   30 tablet   1   . clopidogrel (PLAVIX) 75 MG tablet   Oral   Take 1 tablet (75 mg total) by mouth daily.   90 tablet   3   . niacin 100 MG tablet      Titrate from 100 mg three times daily to 500 mg three times daily   30 tablet   2   . EXPIRED: nitroGLYCERIN (NITROSTAT) 0.4 MG SL tablet   Sublingual   Place 1 tablet (0.4 mg total) under the tongue every 5 (five) minutes as needed for chest pain.   25 tablet   3   . oxyCODONE-acetaminophen (ROXICET) 5-325 MG tablet   Oral   Take 1 tablet by mouth every 6 (six) hours as needed for severe pain.  20 tablet   0   . sildenafil (VIAGRA) 50 MG tablet   Oral   Take 0.5 tablets (25 mg total) by mouth as needed for erectile dysfunction.   5 tablet   11   . venlafaxine (EFFEXOR) 37.5 MG tablet   Oral   Take 1 tablet (37.5 mg total) by mouth 2 (two) times daily.   60 tablet   3     Allergies Review of patient's allergies indicates no known allergies.  Family History  Problem Relation Age of Onset  . Depression Mother 44    Suicide  . Heart disease Father 76    Deceased from massive heart-attack  . Hyperlipidemia Father   . Hypertension Father   . COPD Maternal Grandfather   . Cancer Paternal Grandmother     Male Cancer  . Heart disease Paternal Grandmother     Social History Social History  Substance Use Topics  . Smoking status: Current Every Day Smoker -- 1.00 packs/day for 27  years  . Smokeless tobacco: Never Used  . Alcohol Use: No     Comment:  Alcoholism- quit 2002     Review of Systems  Constitutional: No fever/chills Cardiovascular: no chest pain. Respiratory: no cough. No SOB. Skin: Positive for rash to mid thorax. Neurological: Negative for headaches, focal weakness or numbness. 10-point ROS otherwise negative.  ____________________________________________   PHYSICAL EXAM:  VITAL SIGNS: ED Triage Vitals  Enc Vitals Group     BP 02/25/16 1910 103/88 mmHg     Pulse Rate 02/25/16 1910 92     Resp 02/25/16 1910 20     Temp 02/25/16 1910 98.3 F (36.8 C)     Temp Source 02/25/16 1910 Oral     SpO2 02/25/16 1910 100 %     Weight 02/25/16 1910 240 lb (108.863 kg)     Height 02/25/16 1910 5\' 9"  (1.753 m)     Head Cir --      Peak Flow --      Pain Score 02/25/16 1909 5     Pain Loc --      Pain Edu? --      Excl. in [place]? --      Constitutional: Alert and oriented. Well appearing and in no acute distress. Eyes: Conjunctivae are normal. PERRL. EOMI. Head: Atraumatic. Neck: No stridor.    Cardiovascular: Normal rate, regular rhythm. Normal S1 and S2.  Good peripheral circulation. Respiratory: Normal respiratory effort without tachypnea or retractions. Lungs CTAB. Good air entry to the bases with no decreased or absent breath sounds. Musculoskeletal: Full range of motion to all extremities. No gross deformities appreciated. Neurologic:  Normal speech and language. No gross focal neurologic deficits are appreciated.  Skin:  Skin is warm, dry and intact. Positive for erythematous lesions in dermatomal distribution mid thorax. Area is mildly tender to palpation. No drainage is noted at this point. Psychiatric: Mood and affect are normal. Speech and behavior are normal. Patient exhibits appropriate insight and judgement.   ____________________________________________   LABS (all labs ordered are listed, but only abnormal results are  displayed)  Labs Reviewed - No data to display ____________________________________________  EKG   ____________________________________________  RADIOLOGY   No results found.  ____________________________________________    PROCEDURES  Procedure(s) performed:       Medications - No data to display   ____________________________________________   INITIAL IMPRESSION / ASSESSMENT AND PLAN / ED COURSE  Pertinent labs & imaging results that were available during my care  of the patient were reviewed by me and considered in my medical decision making (see chart for details).  Patient's diagnosis is consistent with shingles. Patient will be discharged home with prescriptions for antivirals and pain medication. Patient is to follow up with primary care provider as needed or otherwise directed. Patient is given ED precautions to return to the ED for any worsening or new symptoms.     ____________________________________________  FINAL CLINICAL IMPRESSION(S) / ED DIAGNOSES  Final diagnoses:  Shingles      NEW MEDICATIONS STARTED DURING THIS VISIT:  New Prescriptions   ACYCLOVIR (ZOVIRAX) 800 MG TABLET    Take 1 tablet (800 mg total) by mouth 5 (five) times daily.   OXYCODONE-ACETAMINOPHEN (ROXICET) 5-325 MG TABLET    Take 1 tablet by mouth every 6 (six) hours as needed for severe pain.        This chart was dictated using voice recognition software/Dragon. Despite best efforts to proofread, errors can occur which can change the meaning. Any change was purely unintentional.    Darletta Moll, PA-C 02/25/16 1938  Earleen Newport, MD 02/25/16 2149

## 2016-02-25 NOTE — Discharge Instructions (Signed)

## 2016-02-25 NOTE — ED Notes (Signed)
AAOx3.  Skin warm and dry.  NAD 

## 2016-02-25 NOTE — ED Notes (Signed)
Pt reports to ED w/ c/o rash that started 1 week ago.  Pt sts that he has been under incr stress this past month.  Sts that rash itches and burns.  Rash runs from back to front in line at mid back.  NAD.

## 2016-03-27 ENCOUNTER — Emergency Department: Payer: Medicare Other

## 2016-03-27 ENCOUNTER — Observation Stay
Admission: EM | Admit: 2016-03-27 | Discharge: 2016-03-27 | Disposition: A | Discharge status: Home / Self Care | Payer: Medicare Other | Attending: Internal Medicine | Admitting: Internal Medicine

## 2016-03-27 ENCOUNTER — Encounter: Payer: Self-pay | Admitting: Emergency Medicine

## 2016-03-27 DIAGNOSIS — E669 Obesity, unspecified: Secondary | ICD-10-CM | POA: Insufficient documentation

## 2016-03-27 DIAGNOSIS — I208 Other forms of angina pectoris: Secondary | ICD-10-CM

## 2016-03-27 DIAGNOSIS — I252 Old myocardial infarction: Secondary | ICD-10-CM | POA: Insufficient documentation

## 2016-03-27 DIAGNOSIS — I2510 Atherosclerotic heart disease of native coronary artery without angina pectoris: Secondary | ICD-10-CM | POA: Diagnosis not present

## 2016-03-27 DIAGNOSIS — Z720 Tobacco use: Secondary | ICD-10-CM | POA: Diagnosis not present

## 2016-03-27 DIAGNOSIS — Z7982 Long term (current) use of aspirin: Secondary | ICD-10-CM | POA: Insufficient documentation

## 2016-03-27 DIAGNOSIS — Z809 Family history of malignant neoplasm, unspecified: Secondary | ICD-10-CM | POA: Diagnosis not present

## 2016-03-27 DIAGNOSIS — Z9119 Patient's noncompliance with other medical treatment and regimen: Secondary | ICD-10-CM | POA: Diagnosis not present

## 2016-03-27 DIAGNOSIS — Z825 Family history of asthma and other chronic lower respiratory diseases: Secondary | ICD-10-CM | POA: Insufficient documentation

## 2016-03-27 DIAGNOSIS — I25110 Atherosclerotic heart disease of native coronary artery with unstable angina pectoris: Secondary | ICD-10-CM | POA: Diagnosis not present

## 2016-03-27 DIAGNOSIS — Z8249 Family history of ischemic heart disease and other diseases of the circulatory system: Secondary | ICD-10-CM | POA: Insufficient documentation

## 2016-03-27 DIAGNOSIS — Z9114 Patient's other noncompliance with medication regimen: Secondary | ICD-10-CM | POA: Insufficient documentation

## 2016-03-27 DIAGNOSIS — I10 Essential (primary) hypertension: Secondary | ICD-10-CM | POA: Insufficient documentation

## 2016-03-27 DIAGNOSIS — F329 Major depressive disorder, single episode, unspecified: Secondary | ICD-10-CM | POA: Diagnosis not present

## 2016-03-27 DIAGNOSIS — Z955 Presence of coronary angioplasty implant and graft: Secondary | ICD-10-CM | POA: Diagnosis not present

## 2016-03-27 DIAGNOSIS — E785 Hyperlipidemia, unspecified: Secondary | ICD-10-CM | POA: Diagnosis not present

## 2016-03-27 DIAGNOSIS — F419 Anxiety disorder, unspecified: Secondary | ICD-10-CM | POA: Diagnosis not present

## 2016-03-27 DIAGNOSIS — F17200 Nicotine dependence, unspecified, uncomplicated: Secondary | ICD-10-CM | POA: Insufficient documentation

## 2016-03-27 DIAGNOSIS — R072 Precordial pain: Secondary | ICD-10-CM | POA: Diagnosis not present

## 2016-03-27 DIAGNOSIS — R079 Chest pain, unspecified: Secondary | ICD-10-CM | POA: Diagnosis not present

## 2016-03-27 DIAGNOSIS — I200 Unstable angina: Secondary | ICD-10-CM | POA: Diagnosis present

## 2016-03-27 DIAGNOSIS — Z818 Family history of other mental and behavioral disorders: Secondary | ICD-10-CM | POA: Insufficient documentation

## 2016-03-27 DIAGNOSIS — Z23 Encounter for immunization: Secondary | ICD-10-CM

## 2016-03-27 DIAGNOSIS — F17210 Nicotine dependence, cigarettes, uncomplicated: Secondary | ICD-10-CM | POA: Diagnosis not present

## 2016-03-27 DIAGNOSIS — Z6833 Body mass index (BMI) 33.0-33.9, adult: Secondary | ICD-10-CM | POA: Diagnosis not present

## 2016-03-27 DIAGNOSIS — G4730 Sleep apnea, unspecified: Secondary | ICD-10-CM | POA: Insufficient documentation

## 2016-03-27 LAB — CBC
HEMATOCRIT: 48.9 % (ref 40.0–52.0)
Hemoglobin: 16.8 g/dL (ref 13.0–18.0)
MCH: 30.9 pg (ref 26.0–34.0)
MCHC: 34.4 g/dL (ref 32.0–36.0)
MCV: 89.9 fL (ref 80.0–100.0)
Platelets: 180 10*3/uL (ref 150–440)
RBC: 5.44 MIL/uL (ref 4.40–5.90)
RDW: 14 % (ref 11.5–14.5)
WBC: 9.1 10*3/uL (ref 3.8–10.6)

## 2016-03-27 LAB — BASIC METABOLIC PANEL
ANION GAP: 8 (ref 5–15)
BUN: 14 mg/dL (ref 6–20)
CHLORIDE: 106 mmol/L (ref 101–111)
CO2: 23 mmol/L (ref 22–32)
Calcium: 9.2 mg/dL (ref 8.9–10.3)
Creatinine, Ser: 0.84 mg/dL (ref 0.61–1.24)
GFR calc non Af Amer: >60 mL/min (ref >60)
GLUCOSE: 139 mg/dL — AB (ref 65–99)
Potassium: 3.6 mmol/L (ref 3.5–5.1)
Sodium: 137 mmol/L (ref 135–145)

## 2016-03-27 LAB — TROPONIN I: Troponin I: <0.03 ng/mL (ref <0.031)

## 2016-03-27 MED ORDER — NITROGLYCERIN 0.4 MG SL SUBL
0.4000 mg | SUBLINGUAL_TABLET | Freq: Once | SUBLINGUAL | Status: AC
  Administered 2016-03-27: 0.4 mg via SUBLINGUAL
  Filled 2016-03-27: qty 1

## 2016-03-27 MED ORDER — NITROGLYCERIN 2 % TD OINT
TOPICAL_OINTMENT | TRANSDERMAL | Status: AC
  Administered 2016-03-27: 1 [in_us] via TOPICAL
  Filled 2016-03-27: qty 1

## 2016-03-27 MED ORDER — NIACIN 500 MG PO TABS: 500.0000 mg | ORAL_TABLET | Freq: Three times a day (TID) | ORAL | Status: DC

## 2016-03-27 MED ORDER — ENOXAPARIN SODIUM 120 MG/0.8ML ~~LOC~~ SOLN
1.0000 mg/kg | Freq: Once | SUBCUTANEOUS | Status: AC
  Administered 2016-03-27: 105 mg via SUBCUTANEOUS
  Filled 2016-03-27: qty 0.8

## 2016-03-27 MED ORDER — CARVEDILOL 25 MG PO TABS: 25.0000 mg | ORAL_TABLET | Freq: Two times a day (BID) | ORAL | Status: DC

## 2016-03-27 MED ORDER — NITROGLYCERIN 0.4 MG SL SUBL: 0.4000 mg | SUBLINGUAL_TABLET | SUBLINGUAL | Status: DC | PRN

## 2016-03-27 MED ORDER — NITROGLYCERIN 2 % TD OINT
1.0000 [in_us] | TOPICAL_OINTMENT | Freq: Once | TRANSDERMAL | Status: AC
  Administered 2016-03-27: 1 [in_us] via TOPICAL

## 2016-03-27 MED ORDER — ATORVASTATIN CALCIUM 80 MG PO TABS: 80.0000 mg | ORAL_TABLET | Freq: Every day | ORAL | Status: DC

## 2016-03-27 MED ORDER — NITROGLYCERIN 2 % TD OINT: 1.0000 [in_us] | TOPICAL_OINTMENT | Freq: Once | TRANSDERMAL | Status: DC

## 2016-03-27 MED ORDER — NICOTINE 21 MG/24HR TD PT24: 21.0000 mg | MEDICATED_PATCH | Freq: Every day | TRANSDERMAL | Status: DC

## 2016-03-27 MED ORDER — CLOPIDOGREL BISULFATE 75 MG PO TABS
75.0000 mg | ORAL_TABLET | Freq: Once | ORAL | Status: AC
  Administered 2016-03-27: 75 mg via ORAL
  Filled 2016-03-27: qty 1

## 2016-03-27 MED ORDER — CLOPIDOGREL BISULFATE 75 MG PO TABS: 75.0000 mg | ORAL_TABLET | Freq: Every day | ORAL | Status: DC

## 2016-03-27 MED ORDER — ASPIRIN 81 MG PO CHEW
324.0000 mg | CHEWABLE_TABLET | Freq: Once | ORAL | Status: AC
Start: 2016-03-27 — End: 2016-03-27
  Administered 2016-03-27: 324 mg via ORAL
  Filled 2016-03-27: qty 4

## 2016-03-27 MED ORDER — ASPIRIN 81 MG PO CHEW: 81.0000 mg | CHEWABLE_TABLET | Freq: Every day | ORAL | Status: DC

## 2016-03-27 NOTE — ED Notes (Signed)
Admitting in to see pt at this time.  

## 2016-03-27 NOTE — ED Provider Notes (Signed)
[facility] Emergency Department Provider Note        Time seen: ----------------------------------------- 2:58 PM on 03/27/2016 -----------------------------------------    I have reviewed the triage vital signs and the nursing notes.   HISTORY  Chief Complaint Chest Pain    HPI Brandon [name] is a 45 y.o. male who presents to ER with crushing midsternal chest pain that began this morning. Patient states she's had 5 heart attacks in the past and it feels like he has no blockage. Patient states he does have a strong family history for coronary artery disease. Patient states he's run out of his medicines after his wife died in 2023/02/01. He just recently moved back here after his wife died. Patient presents diaphoretic.   Past Medical History  Diagnosis Date  . Hyperlipidemia   . Hypertension   . Arteriosclerotic cardiovascular disease (ASCVD) 2006, 2012    2006-acute IMI treated with urgent RCA stent; 2007-Cutting Balloon for in-stent restenosis; 02/2010-presented with ACS and minimal troponin elevation:70% LAD, 80% distal circumflex, 80% proximal ramus branch vessel,in-stent restenosis of 70% in the RCA; BMS for proximal critical RCA stenosis, restenosis Nov 2012  . Tobacco abuse     40 pack years  . History of noncompliance with medical treatment     Due to financial considerations    Patient Active Problem List   Diagnosis Date Noted  . Depression 12/29/2012  . Unspecified sleep apnea 12/29/2012  . Anxiety 10/31/2011  . Arteriosclerotic cardiovascular disease (ASCVD)   . Tobacco abuse   . History of noncompliance with medical treatment   . Hyperlipidemia 05/30/2011  . Hypertension 05/30/2011  . Obesity 05/30/2011    Past Surgical History  Procedure Laterality Date  . Coronary angioplasty with stent placement      Allergies Review of patient's allergies indicates no known allergies.  Social History Social History  Substance Use Topics   . Smoking status: Current Every Day Smoker -- 1.00 packs/day for 27 years  . Smokeless tobacco: Never Used  . Alcohol Use: No     Comment:  Alcoholism- quit Jan 31, 2001    Review of Systems Constitutional: Negative for fever. Eyes: Negative for visual changes. ENT: Negative for sore throat. Cardiovascular: Positive for chest pain Respiratory: Negative for shortness of breath. Gastrointestinal: Negative for abdominal pain, vomiting and diarrhea. Genitourinary: Negative for dysuria. Musculoskeletal: Negative for back pain. Skin: Has a for diaphoresis Neurological: Negative for headaches, focal weakness or numbness.  10-point ROS otherwise negative.  ____________________________________________   PHYSICAL EXAM:  VITAL SIGNS: ED Triage Vitals  Enc Vitals Group     BP 03/27/16 1424 120/88 mmHg     Pulse Rate 03/27/16 1424 84     Resp 03/27/16 1424 18     Temp 03/27/16 1424 98.1 F (36.7 C)     Temp Source 03/27/16 1424 Oral     SpO2 03/27/16 1424 99 %     Weight 03/27/16 1424 230 lb (104.327 kg)     Height 03/27/16 1424 5\' 9"  (1.753 m)     Head Cir --      Peak Flow --      Pain Score 03/27/16 1424 7     Pain Loc --      Pain Edu? --      Excl. in [place]? --     Constitutional: Alert and oriented. Well appearing and in no distress. Eyes: Conjunctivae are normal. PERRL. Normal extraocular movements. ENT   Head: Normocephalic and atraumatic.   Nose: No  congestion/rhinnorhea.   Mouth/Throat: Mucous membranes are moist.   Neck: No stridor. Cardiovascular: Normal rate, regular rhythm. No murmurs, rubs, or gallops. Respiratory: Normal respiratory effort without tachypnea nor retractions. Breath sounds are clear and equal bilaterally. No wheezes/rales/rhonchi. Gastrointestinal: Soft and nontender. Normal bowel sounds Musculoskeletal: Nontender with normal range of motion in all extremities. No lower extremity tenderness nor edema. Neurologic:  Normal speech and  language. No gross focal neurologic deficits are appreciated.  Skin:  Skin is warm, dry and intact. No rash noted. Psychiatric: Mood and affect are normal. Speech and behavior are normal.  ____________________________________________  EKG: Interpreted by me.Normal sinus rhythm with normal axis, normal intervals, inferior T wave inversions. No evidence of acute infarction  ____________________________________________  ED COURSE:  Pertinent labs & imaging results that were available during my care of the patient were reviewed by me and considered in my medical decision making (see chart for details). Patient presents with symptoms of unstable angina, I will order aspirin, Plavix, nitroglycerin and Lovenox. Patient will need to be admitted and ruled out. ____________________________________________    LABS (pertinent positives/negatives)  Labs Reviewed  BASIC METABOLIC PANEL - Abnormal; Notable for the following:    Glucose, Bld 139 (*)    All other components within normal limits  CBC  TROPONIN I   CRITICAL CARE Performed by: Earleen Newport   Total critical care time: 30 minutes  Critical care time was exclusive of separately billable procedures and treating other patients.  Critical care was necessary to treat or prevent imminent or life-threatening deterioration.  Critical care was time spent personally by me on the following activities: development of treatment plan with patient and/or surrogate as well as nursing, discussions with consultants, evaluation of patient's response to treatment, examination of patient, obtaining history from patient or surrogate, ordering and performing treatments and interventions, ordering and review of laboratory studies, ordering and review of radiographic studies, pulse oximetry and re-evaluation of patient's condition.  RADIOLOGY  Chest x-ray IMPRESSION: No active cardiopulmonary  disease. ____________________________________________  FINAL ASSESSMENT AND PLAN  Unstable angina  Plan: Patient with labs and imaging as dictated above. Patient presents to ER with chest pain like prior MI. He is too high risk to rule out in the ER. I will discuss with the hospitalist and recommend admission with restarting his medications.   Earleen Newport, MD   Note: This dictation was prepared with Dragon dictation. Any transcriptional errors that result from this process are unintentional   Earleen Newport, MD 03/27/16 [phone]

## 2016-03-27 NOTE — ED Notes (Signed)
Pt notified this RN that he wanted to go home. Pt was explained the risks and benefits of leaving AMA. Pt still wants to leave and does not want to be admitted. edp and Dr. Tressia Miners notified. AMA printed and was signed by the pt and MD, RN.  Pt was given RX  At time of leaving.

## 2016-03-27 NOTE — ED Notes (Signed)
Pt here with c/o "crushing" midsternal cp that began this am, states he has had 5 MI's in the past and that it feels like he has another blockage. Pts father died from heart attack at age 47. Pt burping multiple times in triage. Diaphoretic as well.

## 2016-03-27 NOTE — H&P (Signed)
[facility] NAME: Brandon [name]    MR#:  [id number]  DATE OF BIRTH:  [birth year]  DATE OF ADMISSION:  03/27/2016  PRIMARY CARE PHYSICIAN: No PCP Per Patient   REQUESTING/REFERRING PHYSICIAN: Dr. Lenise Arena  CHIEF COMPLAINT:   Chief Complaint  Patient presents with  . Chest Pain    HISTORY OF PRESENT ILLNESS:  Brandon [name]  is a 45 y.o. male with a known history of Hypertension, hyperlipidemia, ongoing smoking, history of CAD status post RCA stent with in-stent restenosis again in 2012, noncompliant with medications presents to the hospital secondary to crushing chest pain that started this afternoon. Patient has been following with [facility] cardiology in the past. But over the last couple of years he has been moving in and out of the state due to his wife's health who just passed away last month. He hasn't been seeing a physician or taking any of his medications. After his stent restenosis, he was put on Effient but due to financial issues he was changed over to Plavix and that also he was taking once in a while. He denies having any shortness of breath or chest pain up until this afternoon when he was working in his yard. When it started draining they stopped working in all of his sudden knee had crushing pain in his chest that was going down his left arm. No nausea but felt like he was burping a lot at the time. No diaphoresis, fevers chills or other symptoms at this time.  PAST MEDICAL HISTORY:   Past Medical History  Diagnosis Date  . Hyperlipidemia   . Hypertension   . Arteriosclerotic cardiovascular disease (ASCVD) 2006, 2012    2006-acute IMI treated with urgent RCA stent; 2007-Cutting Balloon for in-stent restenosis; 02/2010-presented with ACS and minimal troponin elevation:70% LAD, 80% distal circumflex, 80% proximal ramus branch vessel,in-stent restenosis of 70% in the RCA; BMS for proximal critical RCA  stenosis, restenosis Nov 2012  . Tobacco abuse     40 pack years  . History of noncompliance with medical treatment     Due to financial considerations    PAST SURGICAL HISTORY:   Past Surgical History  Procedure Laterality Date  . Coronary angioplasty with stent placement      SOCIAL HISTORY:   Social History  Substance Use Topics  . Smoking status: Current Every Day Smoker -- 1.00 packs/day for 27 years  . Smokeless tobacco: Never Used  . Alcohol Use: No     Comment:  Alcoholism- quit 2002    FAMILY HISTORY:   Family History  Problem Relation Age of Onset  . Depression Mother 74    Suicide  . Heart disease Father 77    Deceased from massive heart-attack  . Hyperlipidemia Father   . Hypertension Father   . COPD Maternal Grandfather   . Cancer Paternal Grandmother     Male Cancer  . Heart disease Paternal Grandmother     DRUG ALLERGIES:  No Known Allergies  REVIEW OF SYSTEMS:   Review of Systems  Constitutional: Negative for fever, chills, weight loss and malaise/fatigue.  HENT: Negative for ear discharge, ear pain, hearing loss and nosebleeds.   Eyes: Negative for blurred vision, double vision and photophobia.  Respiratory: Negative for cough, hemoptysis, shortness of breath and wheezing.   Cardiovascular: Positive for chest pain. Negative for palpitations, orthopnea and leg swelling.  Gastrointestinal: Positive for nausea. Negative for heartburn, vomiting, abdominal pain, diarrhea, constipation  and melena.  Genitourinary: Negative for dysuria, urgency, frequency and hematuria.  Musculoskeletal: Negative for myalgias, back pain and neck pain.  Skin: Negative for rash.  Neurological: Negative for dizziness, tingling, sensory change, speech change, focal weakness and headaches.  Endo/Heme/Allergies: Does not bruise/bleed easily.  Psychiatric/Behavioral: Negative for depression.    MEDICATIONS AT HOME:   Prior to Admission medications   Medication Sig  Start Date End Date Taking? Authorizing Provider  acyclovir (ZOVIRAX) 800 MG tablet Take 1 tablet (800 mg total) by mouth 5 (five) times daily. 02/25/16   Charline Bills Cuthriell, PA-C  aspirin 81 MG tablet Take 81 mg by mouth daily.      Historical Provider, MD  atorvastatin (LIPITOR) 80 MG tablet Take 1 tablet (80 mg total) by mouth daily. 12/16/12   Yehuda Savannah, MD  carvedilol (COREG) 25 MG tablet Take 1 tablet (25 mg total) by mouth 2 (two) times daily with a meal. 12/16/12   Yehuda Savannah, MD  clonazePAM (KLONOPIN) 1 MG tablet Take 1 tablet (1 mg total) by mouth 2 (two) times daily as needed for anxiety. 12/26/12 12/26/13  Alycia Rossetti, MD  clopidogrel (PLAVIX) 75 MG tablet Take 1 tablet (75 mg total) by mouth daily. 12/23/12   Yehuda Savannah, MD  niacin 100 MG tablet Titrate from 100 mg three times daily to 500 mg three times daily 12/07/11   Yehuda Savannah, MD  nitroGLYCERIN (NITROSTAT) 0.4 MG SL tablet Place 1 tablet (0.4 mg total) under the tongue every 5 (five) minutes as needed for chest pain. 06/22/11 12/23/12  Yehuda Savannah, MD  oxyCODONE-acetaminophen (ROXICET) 5-325 MG tablet Take 1 tablet by mouth every 6 (six) hours as needed for severe pain. 02/25/16   Charline Bills Cuthriell, PA-C  sildenafil (VIAGRA) 50 MG tablet Take 0.5 tablets (25 mg total) by mouth as needed for erectile dysfunction. 12/23/12   Yehuda Savannah, MD  venlafaxine (EFFEXOR) 37.5 MG tablet Take 1 tablet (37.5 mg total) by mouth 2 (two) times daily. 12/26/12   Alycia Rossetti, MD      VITAL SIGNS:  Blood pressure 125/90, pulse 77, temperature 98.1 F (36.7 C), temperature source Oral, resp. rate 20, height 5\' 9"  (1.753 m), weight 104.327 kg (230 lb), SpO2 93 %.  PHYSICAL EXAMINATION:   Physical Exam  GENERAL:  45 y.o.-year-old patient lying in the bed with no acute distress.  EYES: Pupils equal, round, reactive to light and accommodation. No scleral icterus. Extraocular muscles intact.  HEENT: Head  atraumatic, normocephalic. Oropharynx and nasopharynx clear.  NECK:  Supple, no jugular venous distention. No thyroid enlargement, no tenderness.  LUNGS: Normal breath sounds bilaterally, no wheezing, rales,rhonchi or crepitation. No use of accessory muscles of respiration.  CARDIOVASCULAR: S1, S2 normal. No murmurs, rubs, or gallops.  ABDOMEN: Soft, nontender, nondistended. Bowel sounds present. No organomegaly or mass.  EXTREMITIES: No pedal edema, cyanosis, or clubbing.  NEUROLOGIC: Cranial nerves II through XII are intact. Muscle strength 5/5 in all extremities. Sensation intact. Gait not checked.  PSYCHIATRIC: The patient is alert and oriented x 3.  SKIN: No obvious rash, lesion, or ulcer.   LABORATORY PANEL:   CBC  Recent Labs Lab 03/27/16 1427  WBC 9.1  HGB 16.8  HCT 48.9  PLT 180   ------------------------------------------------------------------------------------------------------------------  Chemistries   Recent Labs Lab 03/27/16 1427  NA 137  K 3.6  CL 106  CO2 23  GLUCOSE 139*  BUN 14  CREATININE 0.84  CALCIUM 9.2   ------------------------------------------------------------------------------------------------------------------  Cardiac Enzymes  Recent Labs Lab 03/27/16 1427  TROPONINI <0.03   ------------------------------------------------------------------------------------------------------------------  RADIOLOGY:  Dg Chest 2 View  03/27/2016  CLINICAL DATA:  Crushing chest pain. EXAM: CHEST  2 VIEW COMPARISON:  Feb 24, 2010 FINDINGS: The heart size borderline. The hila, mediastinum, lungs, and pleura are unremarkable. IMPRESSION: No active cardiopulmonary disease. Electronically Signed   By: Dorise Bullion III M.D   On: 03/27/2016 14:51    EKG:   Orders placed or performed during the hospital encounter of 03/27/16  . EKG 12-Lead  . EKG 12-Lead  . ED EKG within 10 minutes  . ED EKG within 10 minutes    IMPRESSION AND PLAN:   Brandon [name]  is a 45 y.o. male with a known history of Hypertension, hyperlipidemia, ongoing smoking, history of CAD status post RCA stent with in-stent restenosis again in 2012, noncompliant with medications presents to the hospital secondary to crushing chest pain that started this afternoon.  #1 Unstable angina- high risk CAD with prior RCA stent and in-stent thrombosis in past (2012) -Noncompliant with meds,  - admit, myoview, recycle troponins - cards consult, ECHO - restarted asa, plavix, statin and coreg - lipid panel  #2 HTN- coreg, nitro paste  #3 Depression/anxiety- on effexor bid   #4 Tobacco use disorder- counseled, not ready to quit, nicotine patch  #5 DVT prophylaxis- lovenox     All the records are reviewed and case discussed with ED provider. Management plans discussed with the patient, family and they are in agreement.  CODE STATUS: Full Code  TOTAL TIME TAKING CARE OF THIS PATIENT: 50 minutes.    Gladstone Lighter M.D on 03/27/2016 at 4:34 PM  Between 7am to 6pm - Pager - [phone]  After 6pm go to [email or web address] - password [email or web address] [facility]  [facility]  Office  [phone]  CC: Primary care physician; No PCP Per Patient

## 2016-03-28 ENCOUNTER — Other Ambulatory Visit: Payer: Medicare Other

## 2016-10-08 ENCOUNTER — Emergency Department (HOSPITAL_COMMUNITY): Payer: Medicare Other

## 2016-10-08 ENCOUNTER — Encounter (HOSPITAL_COMMUNITY): Payer: Self-pay | Admitting: Emergency Medicine

## 2016-10-08 ENCOUNTER — Emergency Department (HOSPITAL_COMMUNITY)
Admission: EM | Admit: 2016-10-08 | Discharge: 2016-10-09 | Disposition: A | Discharge status: Home / Self Care | Payer: Medicare Other | Attending: Emergency Medicine | Admitting: Emergency Medicine

## 2016-10-08 DIAGNOSIS — I2510 Atherosclerotic heart disease of native coronary artery without angina pectoris: Secondary | ICD-10-CM | POA: Insufficient documentation

## 2016-10-08 DIAGNOSIS — Z79899 Other long term (current) drug therapy: Secondary | ICD-10-CM | POA: Diagnosis not present

## 2016-10-08 DIAGNOSIS — I10 Essential (primary) hypertension: Secondary | ICD-10-CM | POA: Insufficient documentation

## 2016-10-08 DIAGNOSIS — J9801 Acute bronchospasm: Secondary | ICD-10-CM

## 2016-10-08 DIAGNOSIS — Z9861 Coronary angioplasty status: Secondary | ICD-10-CM | POA: Diagnosis not present

## 2016-10-08 DIAGNOSIS — R059 Cough, unspecified: Secondary | ICD-10-CM

## 2016-10-08 DIAGNOSIS — R05 Cough: Secondary | ICD-10-CM

## 2016-10-08 DIAGNOSIS — F17210 Nicotine dependence, cigarettes, uncomplicated: Secondary | ICD-10-CM | POA: Insufficient documentation

## 2016-10-08 LAB — RAPID STREP SCREEN (MED CTR MEBANE ONLY): STREPTOCOCCUS, GROUP A SCREEN (DIRECT): NEGATIVE

## 2016-10-08 MED ORDER — IPRATROPIUM-ALBUTEROL 0.5-2.5 (3) MG/3ML IN SOLN
RESPIRATORY_TRACT | Status: AC
  Administered 2016-10-08: 3 mL
  Filled 2016-10-08: qty 3

## 2016-10-08 MED ORDER — ALBUTEROL SULFATE HFA 108 (90 BASE) MCG/ACT IN AERS
4.0000 | INHALATION_SPRAY | RESPIRATORY_TRACT | Status: AC
  Administered 2016-10-09: 4 via RESPIRATORY_TRACT
  Filled 2016-10-08: qty 6.7

## 2016-10-08 MED ORDER — ALBUTEROL SULFATE (2.5 MG/3ML) 0.083% IN NEBU
2.5000 mg | INHALATION_SOLUTION | Freq: Once | RESPIRATORY_TRACT | Status: AC
  Administered 2016-10-08: 2.5 mg via RESPIRATORY_TRACT
  Filled 2016-10-08: qty 3

## 2016-10-08 MED ORDER — ALBUTEROL SULFATE (2.5 MG/3ML) 0.083% IN NEBU
INHALATION_SOLUTION | RESPIRATORY_TRACT | Status: AC
  Administered 2016-10-08: 2.5 mg
  Filled 2016-10-08: qty 3

## 2016-10-08 MED ORDER — IPRATROPIUM-ALBUTEROL 0.5-2.5 (3) MG/3ML IN SOLN
3.0000 mL | Freq: Once | RESPIRATORY_TRACT | Status: AC
  Administered 2016-10-08: 3 mL via RESPIRATORY_TRACT
  Filled 2016-10-08: qty 3

## 2016-10-08 NOTE — ED Provider Notes (Signed)
[facility] DEPT Provider Note   CSN: [id number] Arrival date & time: 10/08/16  2114     History   Chief Complaint Chief Complaint  Patient presents with  . Cough    HPI Brandon [name] is a 45 y.o. male.  HPI  Pt was seen at 2140.  Per pt, c/o gradual onset and persistence of constant sore throat, runny/stuffy nose, sinus congestion, and cough for the past 2 to 3 days.  Denies fevers, no rash, no CP/SOB, no N/V/D, no abd pain.    Past Medical History:  Diagnosis Date  . Arteriosclerotic cardiovascular disease (ASCVD) 2006, 2012   2006-acute IMI treated with urgent RCA stent; 2007-Cutting Balloon for in-stent restenosis; 02/2010-presented with ACS and minimal troponin elevation:70% LAD, 80% distal circumflex, 80% proximal ramus branch vessel,in-stent restenosis of 70% in the RCA; BMS for proximal critical RCA stenosis, restenosis Nov 2012  . History of noncompliance with medical treatment    Due to financial considerations  . Hyperlipidemia   . Hypertension   . Tobacco abuse    40 pack years    Patient Active Problem List   Diagnosis Date Noted  . Unstable angina ([place]) 03/27/2016  . Depression 12/29/2012  . Unspecified sleep apnea 12/29/2012  . Anxiety 10/31/2011  . Arteriosclerotic cardiovascular disease (ASCVD)   . Tobacco abuse   . History of noncompliance with medical treatment   . Hyperlipidemia 05/30/2011  . Hypertension 05/30/2011  . Obesity 05/30/2011    Past Surgical History:  Procedure Laterality Date  . CORONARY ANGIOPLASTY WITH STENT PLACEMENT         Home Medications    Prior to Admission medications   Medication Sig Start Date End Date Taking? Authorizing Provider  atorvastatin (LIPITOR) 80 MG tablet Take 1 tablet (80 mg total) by mouth daily. 03/27/16  Yes Earleen Newport, MD  carvedilol (COREG) 25 MG tablet Take 1 tablet (25 mg total) by mouth 2 (two) times daily with a meal. 03/27/16  Yes Earleen Newport, MD  clopidogrel (PLAVIX)  75 MG tablet Take 1 tablet (75 mg total) by mouth daily. 03/27/16  Yes Earleen Newport, MD  nitroGLYCERIN (NITROSTAT) 0.4 MG SL tablet Place 1 tablet (0.4 mg total) under the tongue every 5 (five) minutes as needed for chest pain. 03/27/16  Yes Earleen Newport, MD    Family History Family History  Problem Relation Age of Onset  . Depression Mother 17    Suicide  . Heart disease Father 40    Deceased from massive heart-attack  . Hyperlipidemia Father   . Hypertension Father   . COPD Maternal Grandfather   . Cancer Paternal Grandmother     Male Cancer  . Heart disease Paternal Grandmother     Social History Social History  Substance Use Topics  . Smoking status: Current Every Day Smoker    Packs/day: 1.00    Years: 27.00    Types: Cigarettes  . Smokeless tobacco: Never Used  . Alcohol use No     Comment:  Alcoholism- quit 2002     Allergies   Patient has no known allergies.   Review of Systems Review of Systems ROS: Statement: All systems negative except as marked or noted in the HPI; Constitutional: Negative for fever and chills. ; ; Eyes: Negative for eye pain, redness and discharge. ; ; ENMT: Negative for ear pain, hoarseness, +nasal congestion, sinus pressure and sore throat. ; ; Cardiovascular: Negative for chest pain, palpitations, diaphoresis, dyspnea and peripheral edema. ; ;  Respiratory: +cough. Negative for wheezing and stridor. ; ; Gastrointestinal: Negative for nausea, vomiting, diarrhea, abdominal pain, blood in stool, hematemesis, jaundice and rectal bleeding. . ; ; Genitourinary: Negative for dysuria, flank pain and hematuria. ; ; Musculoskeletal: Negative for back pain and neck pain. Negative for swelling and trauma.; ; Skin: Negative for pruritus, rash, abrasions, blisters, bruising and skin lesion.; ; Neuro: Negative for headache, lightheadedness and neck stiffness. Negative for weakness, altered level of consciousness, altered mental status, extremity  weakness, paresthesias, involuntary movement, seizure and syncope.       Physical Exam Updated Vital Signs BP 103/75 (BP Location: Left Arm)   Pulse 97   Temp 98.1 F (36.7 C) (Oral)   Resp 20   Ht 5\' 9"  (1.753 m)   Wt 240 lb (108.9 kg)   SpO2 96%   BMI 35.44 kg/m   Physical Exam 2145: Physical examination:  Nursing notes reviewed; Vital signs and O2 SAT reviewed;  Constitutional: Well developed, Well nourished, Well hydrated, In no acute distress; Head:  Normocephalic, atraumatic; Eyes: EOMI, PERRL, No scleral icterus; ENMT: TM's clear bilat. +edemetous nasal turbinates bilat with clear rhinorrhea. Mouth and pharynx without lesions. No tonsillar exudates. No intra-oral edema. No submandibular or sublingual edema. No hoarse voice, no drooling, no stridor. No pain with manipulation of larynx. No trismus. Mouth and pharynx normal, Mucous membranes moist; Neck: Supple, Full range of motion, No lymphadenopathy; Cardiovascular: Regular rate and rhythm, No gallop; Respiratory: Breath sounds coarse & equal bilaterally, faint exp wheezes. Moist cough during exam. Speaking full sentences with ease, Normal respiratory effort/excursion; Chest: Nontender, Movement normal; Abdomen: Soft, Nontender, Nondistended, Normal bowel sounds; Genitourinary: No CVA tenderness; Extremities: Pulses normal, No tenderness, No edema, No calf edema or asymmetry.; Neuro: AA&Ox3, Major CN grossly intact.  Speech clear. No gross focal motor or sensory deficits in extremities.; Skin: Color normal, Warm, Dry.   ED Treatments / Results  Labs (all labs ordered are listed, but only abnormal results are displayed)   EKG  EKG Interpretation None       Radiology   Procedures Procedures (including critical care time)  Medications Ordered in ED Medications  albuterol (PROVENTIL) (2.5 MG/3ML) 0.083% nebulizer solution 2.5 mg (not administered)  ipratropium-albuterol (DUONEB) 0.5-2.5 (3) MG/3ML nebulizer solution 3  mL (not administered)  albuterol (PROVENTIL) (2.5 MG/3ML) 0.083% nebulizer solution (not administered)  ipratropium-albuterol (DUONEB) 0.5-2.5 (3) MG/3ML nebulizer solution (3 mLs  Given 10/08/16 2150)     Initial Impression / Assessment and Plan / ED Course  I have reviewed the triage vital signs and the nursing notes.  Pertinent labs & imaging results that were available during my care of the patient were reviewed by me and considered in my medical decision making (see chart for details).  MDM Reviewed: previous chart, nursing note and vitals Reviewed previous: x-ray Interpretation: labs and x-ray    Dg Chest 2 View Result Date: 10/08/2016 CLINICAL DATA:  45 year old male with cough EXAM: CHEST  2 VIEW COMPARISON:  Chest radiograph dated 03/27/2016 FINDINGS: Two views of the chest demonstrate mild diffuse interstitial prominence and coarsening and peribronchial thickening similar to prior study. Although this may represent chronic finding, bronchitis is not excluded. Clinical correlation is recommended. There is no focal consolidation, pleural effusion, or pneumothorax. The cardiac silhouette is within normal limits. No acute osseous pathology. IMPRESSION: No focal consolidation. Apparent mild interstitial coarsening and mild peribronchial thickening may be chronic or represent represent bronchitis. Clinical correlation is recommended. Electronically Signed   By: Milas Hock  Radparvar M.D.   On: 10/08/2016 23:45    2355:  Pt given nebs with improvement in cough, Sats 95-99% R/A. Tx symptomatically at this time. Dx and testing d/w pt.  Questions answered.  Verb understanding, agreeable to d/c home with outpt f/u.    Final Clinical Impressions(s) / ED Diagnoses   Final diagnoses:  None    New Prescriptions New Prescriptions   No medications on file     Francine Graven, DO 10/11/16 1606

## 2016-10-08 NOTE — ED Notes (Signed)
Pt states he smokes 1 to 1 1/2 pks of cigs daily. 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 Has been coughing since Thursday and is coughing up yellow/green expectorant  He states he has no physician to follow his BP  Education: HTN and kidney failure, heart dx and stroke -

## 2016-10-08 NOTE — ED Triage Notes (Signed)
Pt reports he started coughing on Thursday. Symptoms worse today. Pt c/o productive cough with yellowish brown sputum.

## 2016-10-08 NOTE — ED Notes (Signed)
From radiology 

## 2016-10-08 NOTE — ED Notes (Signed)
Treatment if finished, now pt has expiratory wheezing thruout

## 2016-10-08 NOTE — ED Notes (Signed)
Awaiting radiology report and dispo

## 2016-10-09 MED ORDER — PREDNISONE 20 MG PO TABS: 40.0000 mg | ORAL_TABLET | Freq: Every day | ORAL | 0 refills | Status: DC

## 2016-10-09 MED ORDER — DOXYCYCLINE HYCLATE 100 MG PO TABS: 100.0000 mg | ORAL_TABLET | Freq: Two times a day (BID) | ORAL | 0 refills | Status: DC

## 2016-10-09 MED ORDER — PREDNISONE 50 MG PO TABS
60.0000 mg | ORAL_TABLET | Freq: Once | ORAL | Status: AC
  Administered 2016-10-09: 60 mg via ORAL
  Filled 2016-10-09: qty 1

## 2016-10-09 NOTE — Discharge Instructions (Signed)
Take the prescriptions as directed.  Use your albuterol inhaler (2 to 4 puffs) every 4 hours for the next 7 days, then as needed for cough, wheezing, or shortness of breath.  Call your regular medical doctor tomorrow morning to schedule a follow up appointment within the next 3 days.  Return to the Emergency Department immediately sooner if worsening.  ° °

## 2016-10-11 LAB — CULTURE, GROUP A STREP (THRC)

## 2017-03-20 ENCOUNTER — Other Ambulatory Visit: Payer: Self-pay

## 2017-03-20 ENCOUNTER — Telehealth: Payer: Self-pay | Admitting: Family Medicine

## 2017-03-20 ENCOUNTER — Ambulatory Visit (INDEPENDENT_AMBULATORY_CARE_PROVIDER_SITE_OTHER): Payer: Medicare Other | Admitting: Family Medicine

## 2017-03-20 ENCOUNTER — Encounter: Payer: Self-pay | Admitting: Family Medicine

## 2017-03-20 VITALS — BP 130/88 | HR 87 | Temp 97.9°F | Ht 71.0 in | Wt 263.0 lb

## 2017-03-20 DIAGNOSIS — E785 Hyperlipidemia, unspecified: Secondary | ICD-10-CM

## 2017-03-20 DIAGNOSIS — H66002 Acute suppurative otitis media without spontaneous rupture of ear drum, left ear: Secondary | ICD-10-CM

## 2017-03-20 DIAGNOSIS — Z91199 Patient's noncompliance with other medical treatment and regimen due to unspecified reason: Secondary | ICD-10-CM

## 2017-03-20 DIAGNOSIS — Z720 Tobacco use: Secondary | ICD-10-CM | POA: Diagnosis not present

## 2017-03-20 DIAGNOSIS — Z9119 Patient's noncompliance with other medical treatment and regimen: Secondary | ICD-10-CM

## 2017-03-20 DIAGNOSIS — I2510 Atherosclerotic heart disease of native coronary artery without angina pectoris: Secondary | ICD-10-CM

## 2017-03-20 MED ORDER — AMOXICILLIN 875 MG PO TABS: 875.0000 mg | ORAL_TABLET | Freq: Two times a day (BID) | ORAL | 0 refills | Status: DC

## 2017-03-20 MED ORDER — CLOPIDOGREL BISULFATE 75 MG PO TABS: 75.0000 mg | ORAL_TABLET | Freq: Every day | ORAL | 0 refills | Status: DC

## 2017-03-20 MED ORDER — CARVEDILOL 25 MG PO TABS: 25.0000 mg | ORAL_TABLET | Freq: Two times a day (BID) | ORAL | 0 refills | Status: DC

## 2017-03-20 MED ORDER — ATORVASTATIN CALCIUM 80 MG PO TABS: 80.0000 mg | ORAL_TABLET | Freq: Every day | ORAL | 0 refills | Status: DC

## 2017-03-20 NOTE — Progress Notes (Signed)
Subjective:     Patient ID: Brandon [name], male   DOB: [birth year], 46 y.o.   MRN: [id number]  HPI Brandon [name] is a 45 year old male with a past medical history of hypertension, hyperlipidemia, obesity, ASCVD, and tobacco abuse who present today with ear drainage and a dry cough.   The ear drainage started 6 days ago and stopped 4 days ago. The drainage was yellow. He had no pain associated with the drainage, but does have ear pressure that started 6 days ago and is ongoing. His cough started Friday. It's a dry, paroxysmal cough worse in the day than at night. He has also had night sweats and a tickle in his throat. He has no chest pain, chest congestion, fever, chills, nausea, vomiting, headache, nasal congestion, runny nose, itchy/watery eyes, or shortness of breath. Cough drops have helped some, but Robitussin and Nyquil have not been beneficial.   He smokes a pack and a half per day and has done so for the past 30 years. He does not drink alcohol or use other recreational drugs. He doesn't have a history of allergies or asthma. He is not currently taking any medication for his multiple medical problems.  Review of Systems Pertinent positives and negatives included in the subjective above.    Objective:   Physical Exam Alert and in no distress. Right ear was clear without erythema and no bulging of the TM. Left ear showed an erythematous TM. Nasal mucosa was normal bilaterally. Throat was non-erythematous and without exudate. Neck had no tenderness to palpation and had no lymphadenopathy. Heart was regular rate and rhythm without murmurs, rubs, or gallops.  Lungs were clear to auscultation bilaterally.    Assessment and Plan:     Arteriosclerotic cardiovascular disease (ASCVD) - Plan: clopidogrel (PLAVIX) 75 MG tablet, carvedilol (COREG) 25 MG tablet  History of noncompliance with medical treatment  Hyperlipidemia, unspecified hyperlipidemia type - Plan: atorvastatin (LIPITOR) 80 MG  tablet  Tobacco abuse  Acute suppurative otitis media of left ear without spontaneous rupture of tympanic membrane, recurrence not specified - Plan: amoxicillin (AMOXIL) 875 MG tablet  1. Acute Otitis Media: Ear drainage and symptoms as well as physical exam findings most likely due to acute otitis media. We irrigated the ear. We will also give amoxicillin. 2. ASCVD/HTN/HLD: Patient has a history of ASCVD, hypertension, and hyperlipidemia. We will refill his medications and have him follow up with [place] in a month to manage his chronic medical problems. 3. Tobacco Abuse: We encouraged the patient to decrease smoking and will continue to address this at his next visit. He was seen and examined in conjunction with the med student. JL Patient was seen in conjunction with Dr. Redmond School. Brandon [name], MS3

## 2017-03-20 NOTE — Telephone Encounter (Signed)
Hilda Blades called and states, can't afford meds, wants to know if cheaper meds can be called in as pt pharmacist advised they have cheaper ones.  I asked Hilda Blades to call pharmacy back and ask them what is the cheapest meds for pt.  She called back and said Prevastatin for the Lipitor and not sure on the Carvedilol as they don't know what is being treated.  I explained.  Dr. Redmond School would need a med check with the patient to review records, so that he would know what was being treated before changing up a lot of different meds.  Patient has CPE scheduled in 2 months.  Patient has been without meds over 1 1/2 years.  Pt will have insurance June 1st and then will obtain the meds from the pharmacy.  Pt will advise if insurance does not pay for meds.  Pt will go ahead and obtain amoxil.

## 2017-03-23 ENCOUNTER — Telehealth: Payer: Self-pay | Admitting: Family Medicine

## 2017-03-23 NOTE — Telephone Encounter (Signed)
Girlfriend informed word for word and she verbalized understanding

## 2017-03-23 NOTE — Telephone Encounter (Signed)
Received a call from pt's girlfriend stating that he has white spots on his tongue. They are not bothering him. Is this something to be concerned about. Please advise at [phone].

## 2017-03-23 NOTE — Telephone Encounter (Signed)
I am not overly concerned about them.

## 2017-04-13 ENCOUNTER — Ambulatory Visit: Payer: Medicare Other | Admitting: Family Medicine

## 2017-06-19 ENCOUNTER — Ambulatory Visit (INDEPENDENT_AMBULATORY_CARE_PROVIDER_SITE_OTHER): Payer: Medicare Other | Admitting: Family Medicine

## 2017-06-19 ENCOUNTER — Encounter: Payer: Self-pay | Admitting: Family Medicine

## 2017-06-19 ENCOUNTER — Telehealth: Payer: Self-pay | Admitting: Family Medicine

## 2017-06-19 VITALS — BP 130/90 | HR 89 | Ht 71.0 in | Wt 267.0 lb

## 2017-06-19 DIAGNOSIS — Z720 Tobacco use: Secondary | ICD-10-CM

## 2017-06-19 DIAGNOSIS — M25561 Pain in right knee: Secondary | ICD-10-CM | POA: Diagnosis not present

## 2017-06-19 DIAGNOSIS — I2510 Atherosclerotic heart disease of native coronary artery without angina pectoris: Secondary | ICD-10-CM

## 2017-06-19 DIAGNOSIS — E785 Hyperlipidemia, unspecified: Secondary | ICD-10-CM | POA: Diagnosis not present

## 2017-06-19 DIAGNOSIS — Z91199 Patient's noncompliance with other medical treatment and regimen due to unspecified reason: Secondary | ICD-10-CM

## 2017-06-19 DIAGNOSIS — F325 Major depressive disorder, single episode, in full remission: Secondary | ICD-10-CM

## 2017-06-19 DIAGNOSIS — Z9119 Patient's noncompliance with other medical treatment and regimen: Secondary | ICD-10-CM

## 2017-06-19 DIAGNOSIS — I10 Essential (primary) hypertension: Secondary | ICD-10-CM

## 2017-06-19 DIAGNOSIS — Z23 Encounter for immunization: Secondary | ICD-10-CM | POA: Diagnosis not present

## 2017-06-19 DIAGNOSIS — Z6837 Body mass index (BMI) 37.0-37.9, adult: Secondary | ICD-10-CM

## 2017-06-19 DIAGNOSIS — G4730 Sleep apnea, unspecified: Secondary | ICD-10-CM

## 2017-06-19 LAB — CBC WITH DIFFERENTIAL/PLATELET
Basophils Absolute: 102 {cells}/uL (ref 0–200)
Basophils Relative: 1 %
Eosinophils Absolute: 306 {cells}/uL (ref 15–500)
Eosinophils Relative: 3 %
HCT: 47.1 % (ref 38.5–50.0)
Hemoglobin: 16.4 g/dL (ref 13.2–17.1)
Lymphocytes Relative: 32 %
Lymphs Abs: 3264 {cells}/uL (ref 850–3900)
MCH: 32.4 pg (ref 27.0–33.0)
MCHC: 34.8 g/dL (ref 32.0–36.0)
MCV: 93.1 fL (ref 80.0–100.0)
MPV: 9.4 fL (ref 7.5–12.5)
Monocytes Absolute: 510 {cells}/uL (ref 200–950)
Monocytes Relative: 5 %
Neutro Abs: 6018 {cells}/uL (ref 1500–7800)
Neutrophils Relative %: 59 %
Platelets: 198 K/uL (ref 140–400)
RBC: 5.06 MIL/uL (ref 4.20–5.80)
RDW: 14.5 % (ref 11.0–15.0)
WBC: 10.2 K/uL (ref 4.0–10.5)

## 2017-06-19 MED ORDER — ATORVASTATIN CALCIUM 80 MG PO TABS: 80.0000 mg | ORAL_TABLET | Freq: Every day | ORAL | 3 refills | Status: DC

## 2017-06-19 MED ORDER — CLOPIDOGREL BISULFATE 75 MG PO TABS: 75.0000 mg | ORAL_TABLET | Freq: Every day | ORAL | 3 refills | Status: DC

## 2017-06-19 MED ORDER — CARVEDILOL 25 MG PO TABS: 25.0000 mg | ORAL_TABLET | Freq: Two times a day (BID) | ORAL | 3 refills | Status: DC

## 2017-06-19 NOTE — Patient Instructions (Signed)
  Brandon [name] , Thank you for taking time to come for your Medicare Wellness Visit. I appreciate your ongoing commitment to your health goals. Please review the following plan we discussed and let me know if I can assist you in the future.   These are the goals we discussed: Goals    None      This is a list of the screening recommended for you and due dates:  Health Maintenance  Topic Date Due  . HIV Screening  12/31/1983  . Tetanus Vaccine  12/31/1987  . Flu Shot  05/23/2017

## 2017-06-19 NOTE — Progress Notes (Signed)
Subjective:   HPI  Brandon [name] is a 46 y.o. male who presents for Chief Complaint  Patient presents with  . Medicare Wellness    fasting med check plus    Medical care team includes: Denita Lung, MD here for primary care    Preventative care: [place] ophthalmology visit:5 years ago Last dental visit: 5 years ago Last colonoscopy: n/a Last prostate exam: ? Last EKG:03/28/16 Last labs: 2014  Prior vaccinations:  TD or Tdap: ? Influenza: None. Pneumococcal: Shingles/Zostavax:N/A Advanced directive: No. Information given. Concerns: He does have a 3 month history of right knee pain but no popping, locking, grinding. He has been using ibuprofen 4 tablets twice per day. He has not been taking any of his medications stating monetary issues. He is also in the process of coming off of disability was apparently given due to cardiovascular issues. He does work as an Clinical biochemist. He will apparently be getting regular insurance in approximately one month. He has had no chest pain, shortness of breath, PND or DOE. He does smoke but is not interested in quitting. He also has a previous history of sleep apnea and upon further quizzing, he is not interested in pursuing this any further. He said he is sleeping well and wakes up refreshed. He also has a previous history of anxiety and depression related to his underlying cardiac condition but at this point states that he is totally back to normal and not in need of any medication. Reviewed their medical, surgical, family, social, medication, and allergy history and updated chart as appropriate.  Past Medical History:  Diagnosis Date  . Arteriosclerotic cardiovascular disease (ASCVD) 2006, 2012   2006-acute IMI treated with urgent RCA stent; 2007-Cutting Balloon for in-stent restenosis; 02/2010-presented with ACS and minimal troponin elevation:70% LAD, 80% distal circumflex, 80% proximal ramus branch vessel,in-stent restenosis of 70% in the  RCA; BMS for proximal critical RCA stenosis, restenosis Nov 2012  . History of noncompliance with medical treatment    Due to financial considerations  . Hyperlipidemia   . Hypertension   . Tobacco abuse    40 pack years    Past Surgical History:  Procedure Laterality Date  . CORONARY ANGIOPLASTY WITH STENT PLACEMENT      Social History   Social History  . Marital status: Widowed    Spouse name: N/A  . Number of children: 2  . Years of education: N/A   Occupational History  . Disabled     Previously employed as an Programmer, systems   Social History Main Topics  . Smoking status: Current Every Day Smoker    Packs/day: 1.00    Years: 27.00    Types: Cigarettes  . Smokeless tobacco: Never Used  . Alcohol use No     Comment:  Alcoholism- quit 2002  . Drug use: No  . Sexual activity: Not on file   Other Topics Concern  . Not on file   Social History Narrative             Family History  Problem Relation Age of Onset  . Depression Mother 37       Suicide  . Heart disease Father 98       Deceased from massive heart-attack  . Hyperlipidemia Father   . Hypertension Father   . COPD Maternal Grandfather   . Cancer Paternal Grandmother        Male Cancer  . Heart disease Paternal Grandmother      Current Outpatient  Prescriptions:  .  aspirin EC 81 MG tablet, Take 81 mg by mouth daily., Disp: , Rfl:  .  atorvastatin (LIPITOR) 80 MG tablet, Take 1 tablet (80 mg total) by mouth daily. (Patient not taking: Reported on 06/19/2017), Disp: 90 tablet, Rfl: 0 .  carvedilol (COREG) 25 MG tablet, Take 1 tablet (25 mg total) by mouth 2 (two) times daily with a meal. (Patient not taking: Reported on 06/19/2017), Disp: 180 tablet, Rfl: 0 .  clopidogrel (PLAVIX) 75 MG tablet, Take 1 tablet (75 mg total) by mouth daily. (Patient not taking: Reported on 06/19/2017), Disp: 90 tablet, Rfl: 0 .  doxycycline (VIBRA-TABS) 100 MG tablet, Take 1 tablet (100 mg total) by mouth 2  (two) times daily. (Patient not taking: Reported on 03/20/2017), Disp: 14 tablet, Rfl: 0 .  nitroGLYCERIN (NITROSTAT) 0.4 MG SL tablet, Place 1 tablet (0.4 mg total) under the tongue every 5 (five) minutes as needed for chest pain. (Patient not taking: Reported on 06/19/2017), Disp: 30 tablet, Rfl: 2 .  predniSONE (DELTASONE) 20 MG tablet, Take 2 tablets (40 mg total) by mouth daily. (Patient not taking: Reported on 03/20/2017), Disp: 10 tablet, Rfl: 0  Current Facility-Administered Medications:  .  Influenza (>/= 3 years) inactive virus vaccine (FLVIRIN/FLUZONE) injection SUSP 0.5 mL, 0.5 mL, Intramuscular, Once, Fayrene Helper, MD  No Known Allergies     Review of Systems Constitutional: -fever, -chills, -sweats, -unexpected weight change, -decreased appetite, -fatigue Psychology: -depressed mood, -agitation, -sleep problems     Objective:  General appearance: alert, no distress, WD/WN, Caucasian male Skin: Normal HEENT: normocephalic, conjunctiva/corneas normal, sclerae anicteric, PERRLA, EOMi, nares patent, no discharge or erythema, pharynx normal Oral cavity: MMM, tongue normal, teeth normal Neck: supple, no lymphadenopathy, no thyromegaly, no masses, normal ROM, no bruits Chest: non tender, normal shape and expansion Heart: RRR, normal S1, S2, no murmurs Lungs: CTA bilaterally, no wheezes, rhonchi, or rales Abdomen: +bs, soft, non tender, non distended, no masses, no hepatomegaly, no splenomegaly, no bruits  Musculoskeletal: upper extremities non tender, no obvious deformity, normal ROM throughout, lower extremities non tender, no obvious deformity, normal ROM throughout Extremities: no edema, no cyanosis, no clubbing Pulses: 2+ symmetric, upper and lower extremities, normal cap refill Neurological: alert, oriented x 3, CN2-12 intact, strength normal upper extremities and lower extremities, sensation normal throughout, DTRs 2+ throughout, no cerebellar signs, gait  normal Psychiatric: normal affect, behavior normal, pleasant    Assessment and Plan :    Arteriosclerotic cardiovascular disease (ASCVD) - Plan: Amb ref to Medical Nutrition Therapy-MNT, CBC with Differential/Platelet, Comprehensive metabolic panel, Lipid panel, clopidogrel (PLAVIX) 75 MG tablet, carvedilol (COREG) 25 MG tablet  Hyperlipidemia, unspecified hyperlipidemia type - Plan: Amb ref to Medical Nutrition Therapy-MNT, Lipid panel, atorvastatin (LIPITOR) 80 MG tablet  Acute pain of right knee - Plan: DG Knee Complete 4 Views Right  History of noncompliance with medical treatment  Essential hypertension - Plan: carvedilol (COREG) 25 MG tablet  Tobacco abuse  Class 2 severe obesity due to excess calories with serious comorbidity and body mass index (BMI) of 37.0 to 37.9 in adult [facility]) - Plan: Amb ref to Medical Nutrition Therapy-MNT  Depression, major, in remission ([place])  Sleep apnea, unspecified type  Need for influenza vaccination - Plan: Flu Vaccine QUAD 6+ mos PF IM (Fluarix Quad PF)  I will place him back on all of his medications however recommend he hold off until he make sure that he has regular insurance before filling them due to economic issues.  We'll follow-up on the knee pain after the x-ray. This is probably degenerative and will require an injection. Discussed smoking cessation but at this point he is not interested. I will refer to nutrition. Did talk to him about diet and exercise especially cutting back on carbohydrates. Strongly encouraged him to take better care of himself in regard to diet, exercise and smoking.  Physical exam - discussed and counseled on healthy lifestyle, diet, exercise, preventative care, vaccinations, sick and well care, proper use of emergency dept and after hours care, and addressed their concerns.

## 2017-06-19 NOTE — Telephone Encounter (Signed)
Wife called to see if there were any samples for this patients meds?  Reviewed meds and chart.  Everything is generic, no samples.

## 2017-06-20 LAB — COMPREHENSIVE METABOLIC PANEL
ALT: 53 U/L — ABNORMAL HIGH (ref 9–46)
AST: 34 U/L (ref 10–40)
Albumin: 4.5 g/dL (ref 3.6–5.1)
Alkaline Phosphatase: 69 U/L (ref 40–115)
BUN: 13 mg/dL (ref 7–25)
CALCIUM: 9.6 mg/dL (ref 8.6–10.3)
CO2: 19 mmol/L — AB (ref 20–32)
Chloride: 107 mmol/L (ref 98–110)
Creat: 0.92 mg/dL (ref 0.60–1.35)
GLUCOSE: 91 mg/dL (ref 65–99)
POTASSIUM: 4.3 mmol/L (ref 3.5–5.3)
Sodium: 141 mmol/L (ref 135–146)
Total Bilirubin: 0.8 mg/dL (ref 0.2–1.2)
Total Protein: 7.1 g/dL (ref 6.1–8.1)

## 2017-06-20 LAB — LIPID PANEL
CHOL/HDL RATIO: 7.7 ratio — AB (ref <5.0)
Cholesterol: 316 mg/dL — ABNORMAL HIGH (ref <200)
HDL: 41 mg/dL (ref >40)
LDL CALC: 223 mg/dL — AB (ref <100)
Triglycerides: 262 mg/dL — ABNORMAL HIGH (ref <150)
VLDL: 52 mg/dL — ABNORMAL HIGH (ref <30)

## 2017-06-26 IMAGING — CR DG CHEST 2V
2 series · 2 of 2 positions shown · non-contrast
Comparison: February 24, 2010

CLINICAL DATA: Crushing chest pain.

EXAM:
CHEST  2 VIEW

[chest pa]
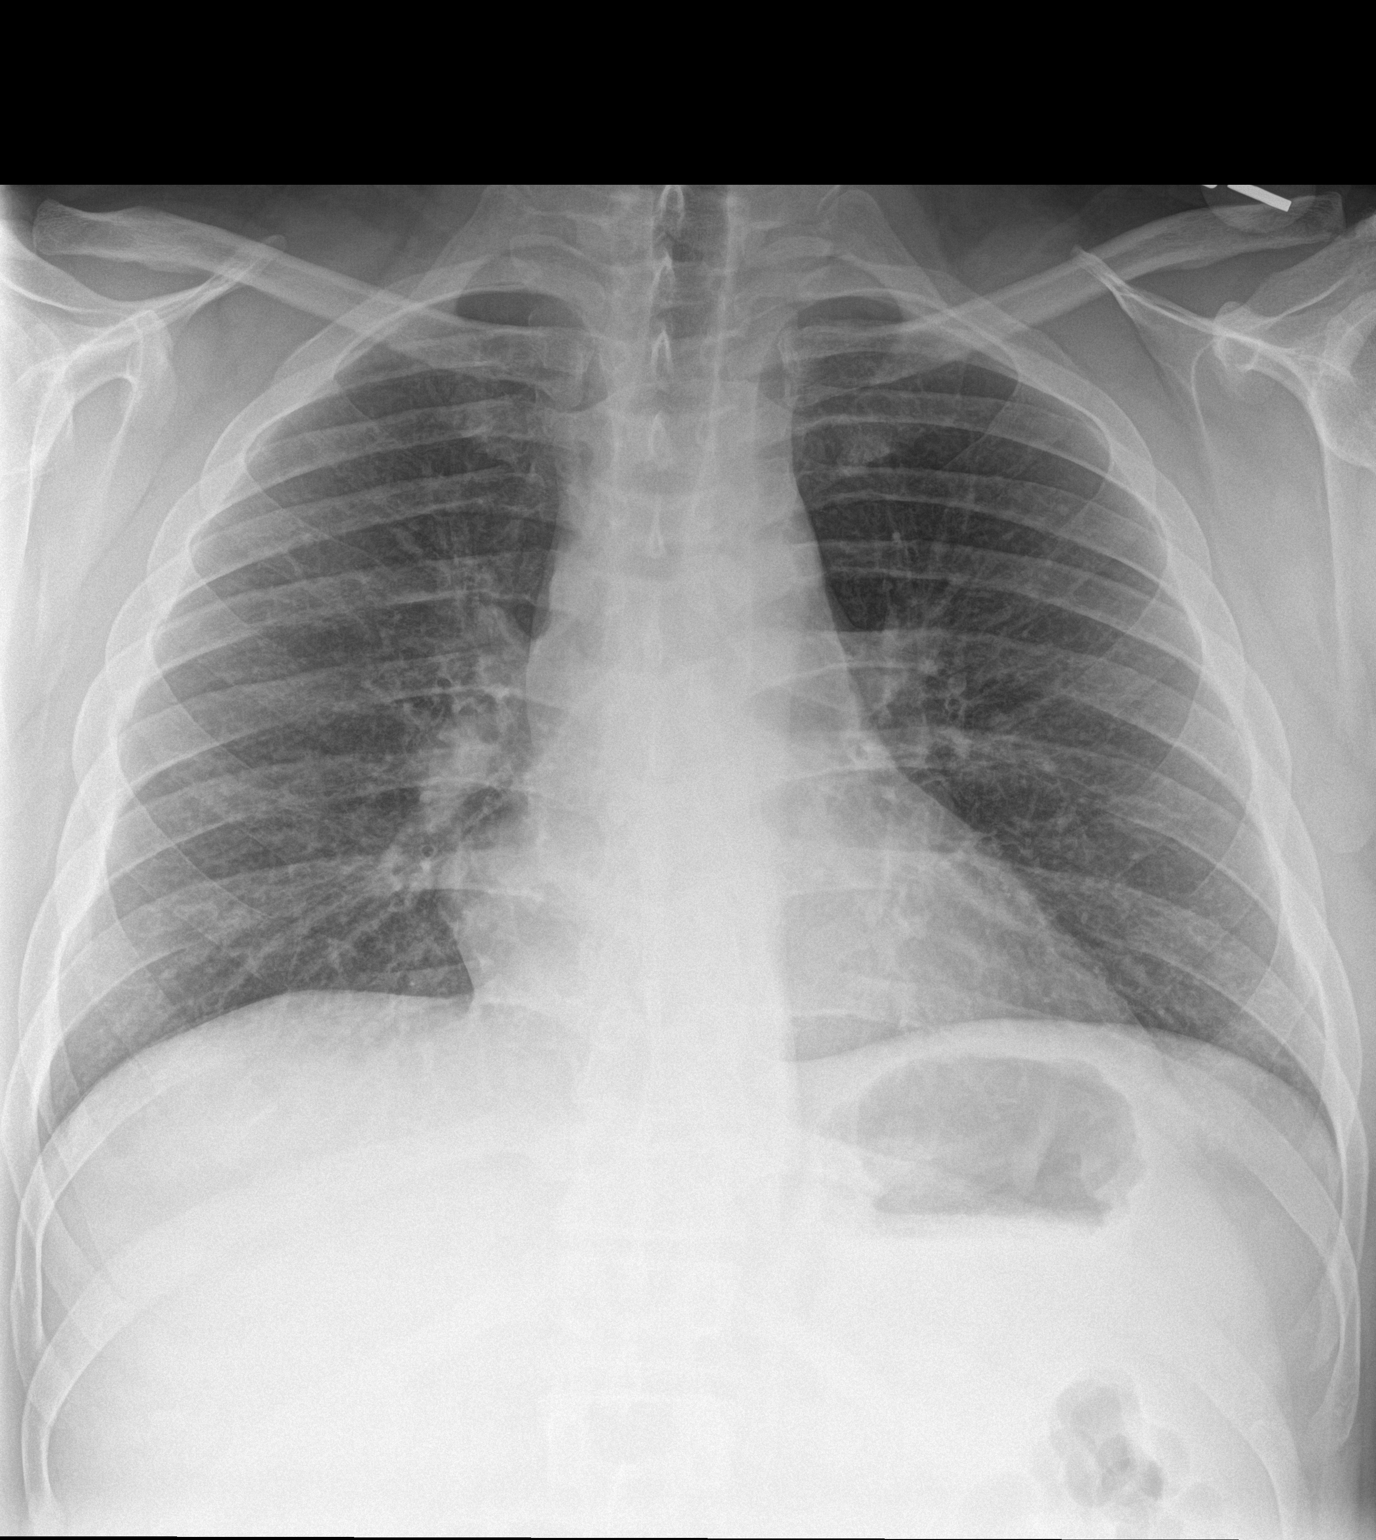

[chest lat]
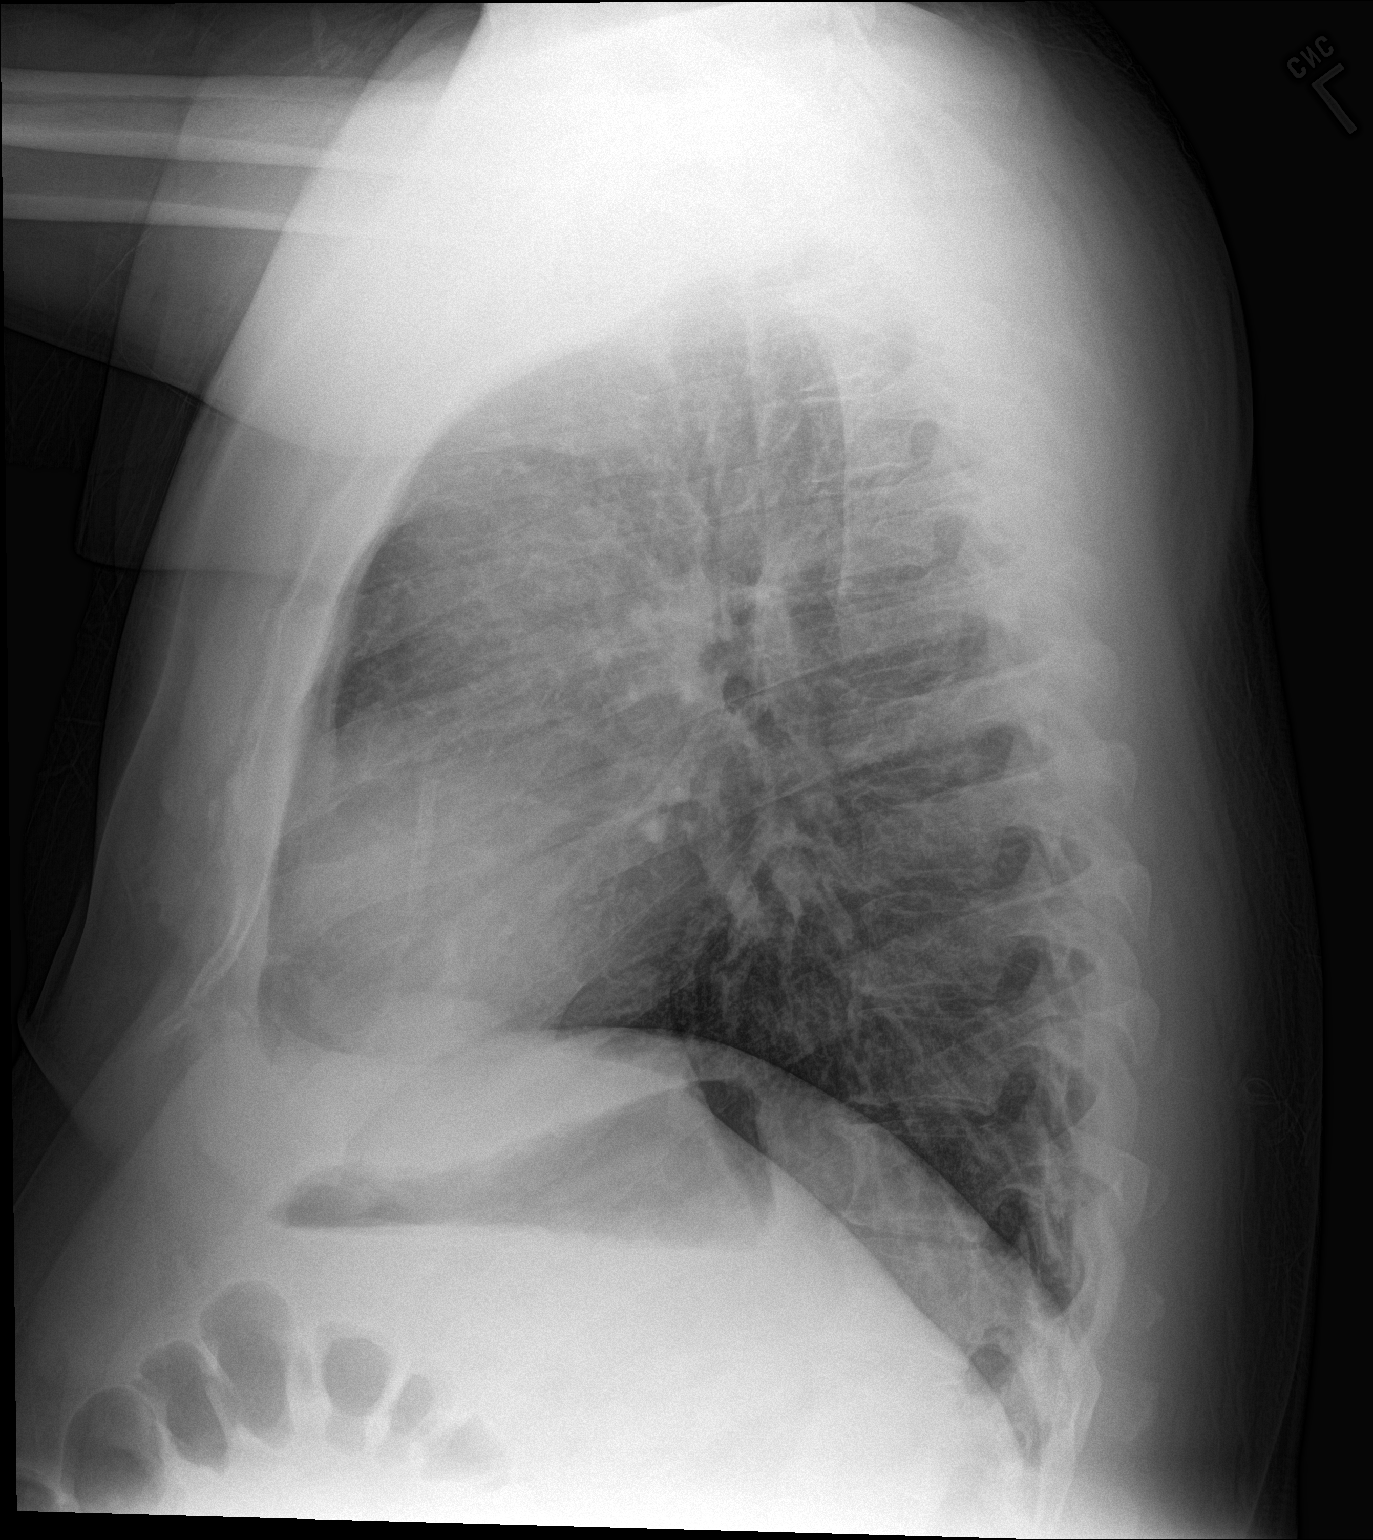

[2 of 2 positions shown; findings below may reference images not displayed]

FINDINGS: The heart size borderline. The hila, mediastinum, lungs, and pleura
are unremarkable.
IMPRESSION: No active cardiopulmonary disease.

## 2017-07-02 ENCOUNTER — Ambulatory Visit (INDEPENDENT_AMBULATORY_CARE_PROVIDER_SITE_OTHER): Payer: Medicare Other | Admitting: Family Medicine

## 2017-07-02 DIAGNOSIS — Z1211 Encounter for screening for malignant neoplasm of colon: Secondary | ICD-10-CM

## 2017-07-02 DIAGNOSIS — F489 Nonpsychotic mental disorder, unspecified: Secondary | ICD-10-CM

## 2017-07-02 DIAGNOSIS — Z0389 Encounter for observation for other suspected diseases and conditions ruled out: Secondary | ICD-10-CM

## 2017-07-02 LAB — POC HEMOCCULT BLD/STL (HOME/3-CARD/SCREEN)
Card #2 Fecal Occult Blod, POC: NEGATIVE
Card #3 Fecal Occult Blood, POC: NEGATIVE
FECAL OCCULT BLD: NEGATIVE

## 2017-07-03 NOTE — Progress Notes (Signed)
   Subjective:    Patient ID: Brandon [name], male    DOB: 12-Jan-1971, 46 y.o.   MRN: [id number]  HPI    Review of Systems     Objective:   Physical Exam        Assessment & Plan:    mmmmm error

## 2017-07-12 ENCOUNTER — Encounter (HOSPITAL_COMMUNITY): Payer: Self-pay | Admitting: *Deleted

## 2017-07-12 ENCOUNTER — Emergency Department (HOSPITAL_COMMUNITY): Payer: Medicare Other

## 2017-07-12 ENCOUNTER — Emergency Department (HOSPITAL_COMMUNITY)
Admission: EM | Admit: 2017-07-12 | Discharge: 2017-07-12 | Disposition: A | Discharge status: Home / Self Care | Payer: Medicare Other | Attending: Emergency Medicine | Admitting: Emergency Medicine

## 2017-07-12 ENCOUNTER — Telehealth: Payer: Self-pay | Admitting: Family Medicine

## 2017-07-12 DIAGNOSIS — Z7982 Long term (current) use of aspirin: Secondary | ICD-10-CM | POA: Insufficient documentation

## 2017-07-12 DIAGNOSIS — Z955 Presence of coronary angioplasty implant and graft: Secondary | ICD-10-CM | POA: Diagnosis not present

## 2017-07-12 DIAGNOSIS — M25461 Effusion, right knee: Secondary | ICD-10-CM

## 2017-07-12 DIAGNOSIS — I10 Essential (primary) hypertension: Secondary | ICD-10-CM | POA: Insufficient documentation

## 2017-07-12 DIAGNOSIS — M25561 Pain in right knee: Secondary | ICD-10-CM | POA: Diagnosis not present

## 2017-07-12 DIAGNOSIS — Z79899 Other long term (current) drug therapy: Secondary | ICD-10-CM | POA: Diagnosis not present

## 2017-07-12 DIAGNOSIS — F17210 Nicotine dependence, cigarettes, uncomplicated: Secondary | ICD-10-CM | POA: Diagnosis not present

## 2017-07-12 DIAGNOSIS — Z7902 Long term (current) use of antithrombotics/antiplatelets: Secondary | ICD-10-CM | POA: Diagnosis not present

## 2017-07-12 MED ORDER — HYDROCODONE-ACETAMINOPHEN 5-325 MG PO TABS: 1.0000 | ORAL_TABLET | Freq: Once | ORAL | Status: DC

## 2017-07-12 MED ORDER — PREDNISONE 10 MG PO TABS: ORAL_TABLET | ORAL | 0 refills | Status: DC

## 2017-07-12 MED ORDER — PREDNISONE 50 MG PO TABS: 60.0000 mg | ORAL_TABLET | Freq: Once | ORAL | Status: DC

## 2017-07-12 MED ORDER — NAPROXEN 500 MG PO TABS: 500.0000 mg | ORAL_TABLET | Freq: Two times a day (BID) | ORAL | 0 refills | Status: DC

## 2017-07-12 MED ORDER — HYDROCODONE-ACETAMINOPHEN 5-325 MG PO TABS: 1.0000 | ORAL_TABLET | ORAL | 0 refills | Status: DC | PRN

## 2017-07-12 NOTE — ED Triage Notes (Signed)
Pt comes in from PCP's office for right knee pain for 2-3 months. They have him an xray scheduled pt states the pain was too bad that he wanted to come in and be evaluated. Pt ambulatory.

## 2017-07-12 NOTE — Discharge Instructions (Signed)
As discussed,  minimize bending and knee movement which will continue to worsen the pain and swelling.  I suspect you may have some injury to the cartilage in your knee which does not show up on an xray.  Complete the entire prednisone taper.  You may take the proximal and after the prednisone taper is completed if needed.  Do not drive within 4 hours of taking hydrocodone as this medication will make you drowsy.  Call Dr. Aline Brochure for further evaluation of your pain

## 2017-07-12 NOTE — Telephone Encounter (Signed)
Wife called & states pt in lot of pain today, can't hardly walk and wants to go to ER for X-ray and see if can get some help today because he can't even work.  Called [facility] and they will see him thru [facility] and do X-ray because orders are already in system.  Wife informed

## 2017-07-14 NOTE — ED Provider Notes (Signed)
[facility] DEPT Provider Note   CSN: [id number] Arrival date & time: 07/12/17  1129     History   Chief Complaint Chief Complaint  Patient presents with  . Knee Pain    HPI Brandon [name] is a 46 y.o. male.  The history is provided by the patient and the spouse.  Knee Pain   This is a chronic problem. Episode onset: 2 months. The problem occurs constantly. The problem has been gradually worsening (Pt is an Programmer, systems. States was just unable to climb ladders today given worsening pain prompting ed visit.). The pain is present in the right knee. The quality of the pain is described as sharp, aching and constant. The pain is at a severity of 6/10. The pain is moderate. Associated symptoms include limited range of motion and stiffness. Pertinent negatives include no numbness. The symptoms are aggravated by activity and standing. He has tried OTC pain medications, rest and cold for the symptoms. The treatment provided no relief. There has been no history of extremity trauma. Family history is significant for no rheumatoid arthritis and no gout.    Past Medical History:  Diagnosis Date  . Arteriosclerotic cardiovascular disease (ASCVD) 2006, 2012   2006-acute IMI treated with urgent RCA stent; 2007-Cutting Balloon for in-stent restenosis; 02/2010-presented with ACS and minimal troponin elevation:70% LAD, 80% distal circumflex, 80% proximal ramus branch vessel,in-stent restenosis of 70% in the RCA; BMS for proximal critical RCA stenosis, restenosis Nov 2012  . History of noncompliance with medical treatment    Due to financial considerations  . Hyperlipidemia   . Hypertension   . Tobacco abuse    40 pack years    Patient Active Problem List   Diagnosis Date Noted  . 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 Sleep apnea 12/29/2012  . Arteriosclerotic cardiovascular disease (ASCVD)   . Tobacco abuse   . History of noncompliance with medical treatment   .  Hyperlipidemia 05/30/2011  . Hypertension 05/30/2011  . Obesity 05/30/2011    Past Surgical History:  Procedure Laterality Date  . CORONARY ANGIOPLASTY WITH STENT PLACEMENT         Home Medications    Prior to Admission medications   Medication Sig Start Date End Date Taking? Authorizing Provider  aspirin EC 81 MG tablet Take 81 mg by mouth daily.    [provider]  atorvastatin (LIPITOR) 80 MG tablet Take 1 tablet (80 mg total) by mouth daily. 06/19/17   Denita Lung, MD  carvedilol (COREG) 25 MG tablet Take 1 tablet (25 mg total) by mouth 2 (two) times daily with a meal. 06/19/17   Denita Lung, MD  clopidogrel (PLAVIX) 75 MG tablet Take 1 tablet (75 mg total) by mouth daily. 06/19/17   Denita Lung, MD  doxycycline (VIBRA-TABS) 100 MG tablet Take 1 tablet (100 mg total) by mouth 2 (two) times daily. Patient not taking: Reported on 03/20/2017 10/09/16   Francine Graven, DO  HYDROcodone-acetaminophen (NORCO/VICODIN) 5-325 MG tablet Take 1 tablet by mouth every 4 (four) hours as needed. 07/12/17   Evalee Jefferson, PA-C  naproxen (NAPROSYN) 500 MG tablet Take 1 tablet (500 mg total) by mouth 2 (two) times daily. 07/12/17   Evalee Jefferson, PA-C  nitroGLYCERIN (NITROSTAT) 0.4 MG SL tablet Place 1 tablet (0.4 mg total) under the tongue every 5 (five) minutes as needed for chest pain. Patient not taking: Reported on 06/19/2017 03/27/16   Earleen Newport, MD  predniSONE (DELTASONE) 10 MG tablet  Take 6 tablets day one, 5 tablets day two, 4 tablets day three, 3 tablets day four, 2 tablets day five, then 1 tablet day six 07/12/17   Evalee Jefferson, PA-C    Family History Family History  Problem Relation Age of Onset  . Depression Mother 66       Suicide  . Heart disease Father 19       Deceased from massive heart-attack  . Hyperlipidemia Father   . Hypertension Father   . COPD Maternal Grandfather   . Cancer Paternal Grandmother        Male Cancer  . Heart disease Paternal  Grandmother     Social History Social History  Substance Use Topics  . Smoking status: Current Every Day Smoker    Packs/day: 1.00    Years: 27.00    Types: Cigarettes  . Smokeless tobacco: Never Used  . Alcohol use No     Comment:  Alcoholism- quit 2002     Allergies   Patient has no known allergies.   Review of Systems Review of Systems  Constitutional: Negative for fever.  Musculoskeletal: Positive for arthralgias, joint swelling and stiffness. Negative for myalgias.  Neurological: Negative for weakness and numbness.     Physical Exam Updated Vital Signs BP (!) 142/74 (BP Location: Right Arm)   Pulse 80   Temp 98.4 F (36.9 C) (Oral)   Resp 18   Ht 5\' 9"  (1.753 m)   Wt 117.9 kg (260 lb)   SpO2 96%   BMI 38.40 kg/m   Physical Exam  Constitutional: He appears well-developed and well-nourished.  HENT:  Head: Atraumatic.  Neck: Normal range of motion.  Cardiovascular:  Pulses equal bilaterally  Musculoskeletal: He exhibits edema and tenderness.       Right knee: He exhibits decreased range of motion and effusion. He exhibits no ecchymosis, no erythema, no LCL laxity and no MCL laxity. No medial joint line, no lateral joint line, no MCL and no LCL tenderness noted.  Crepitus with ROM.  Negative drawer test. ttp along lateral anterior joint line.  Neurological: He is alert. He has normal strength. He displays normal reflexes. No sensory deficit.  Skin: Skin is warm and dry.  Psychiatric: He has a normal mood and affect.     ED Treatments / Results  Labs (all labs ordered are listed, but only abnormal results are displayed) Labs Reviewed - No data to display  EKG  EKG Interpretation None       Radiology Dg Knee Complete 4 Views Right  Result Date: 07/12/2017 CLINICAL DATA:  Right knee pain. EXAM: RIGHT KNEE - COMPLETE 4+ VIEW COMPARISON:  No recent prior. FINDINGS: No acute bony or joint abnormality identified. No evidence of fracture dislocation.  Small knee joint effusion. IMPRESSION: No acute bony abnormality. Electronically Signed   By: Marcello Moores  Register   On: 07/12/2017 11:53    Procedures Procedures (including critical care time)  Medications Ordered in ED Medications - No data to display   Initial Impression / Assessment and Plan / ED Course  I have reviewed the triage vital signs and the nursing notes.  Pertinent labs & imaging results that were available during my care of the patient were reviewed by me and considered in my medical decision making (see chart for details).     Exam suggesting meniscal/cartilage tear, probably from chronic overuse. Pt was seen by his pcp for this last week, xrays ordered but has been unable to get done due to  work schedule. He was placed on naproxen, discussed RICE, has a knee sleeve, defers crutches. Referral to ortho for further management of this complaint.  Final Clinical Impressions(s) / ED Diagnoses   Final diagnoses:  Effusion of right knee    New Prescriptions Discharge Medication List as of 07/12/2017  1:55 PM    START taking these medications   Details  HYDROcodone-acetaminophen (NORCO/VICODIN) 5-325 MG tablet Take 1 tablet by mouth every 4 (four) hours as needed., Starting Thu 07/12/2017, Print    naproxen (NAPROSYN) 500 MG tablet Take 1 tablet (500 mg total) by mouth 2 (two) times daily., Starting Thu 07/12/2017, Print         Evalee Jefferson, PA-C 07/14/17 1016    Noemi Chapel, MD 07/20/17 [phone]

## 2017-08-14 ENCOUNTER — Ambulatory Visit: Payer: Medicare Other | Admitting: Nutrition

## 2017-09-04 ENCOUNTER — Encounter: Payer: Self-pay | Admitting: Orthopaedic Surgery

## 2017-09-04 ENCOUNTER — Ambulatory Visit: Payer: Managed Care, Other (non HMO) | Admitting: Orthopaedic Surgery

## 2017-09-04 VITALS — BP 154/102 | HR 81 | Temp 97.0°F | Ht 71.0 in | Wt 272.0 lb

## 2017-09-04 DIAGNOSIS — M109 Gout, unspecified: Secondary | ICD-10-CM | POA: Diagnosis not present

## 2017-09-04 DIAGNOSIS — F17210 Nicotine dependence, cigarettes, uncomplicated: Secondary | ICD-10-CM

## 2017-09-04 DIAGNOSIS — G8929 Other chronic pain: Secondary | ICD-10-CM

## 2017-09-04 DIAGNOSIS — M25561 Pain in right knee: Secondary | ICD-10-CM | POA: Diagnosis not present

## 2017-09-04 LAB — URIC ACID: URIC ACID, SERUM: 7.7 mg/dL (ref 4.0–8.0)

## 2017-09-04 MED ORDER — NAPROXEN 500 MG PO TABS: 500.0000 mg | ORAL_TABLET | Freq: Two times a day (BID) | ORAL | 5 refills | Status: DC

## 2017-09-04 NOTE — Progress Notes (Signed)
Subjective:    Patient ID: Brandon [name], male    DOB: 1971/06/07, 46 y.o.   MRN: [id number]  HPI He has had pain in the right knee for about three months.  He has had swelling, popping and giving way.  He was seen in the ER on 07-12-17 for this.  X-rays were negative.  He continued to have pain.  He has tried heat, ice, Advil, rest and still has the pain.  The giving way is more often now.  He has no locking.  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  He smokes and is willing to cut back.  Review of Systems  HENT: Negative for congestion.   Respiratory: Negative for cough and shortness of breath.   Cardiovascular: Negative for chest pain and leg swelling.  Endocrine: Negative for cold intolerance.  Musculoskeletal: Positive for arthralgias, gait problem and joint swelling.  Allergic/Immunologic: Negative for environmental allergies.   Past Medical History:  Diagnosis Date  . Arteriosclerotic cardiovascular disease (ASCVD) 2006, 2012   2006-acute IMI treated with urgent RCA stent; 2007-Cutting Balloon for in-stent restenosis; 02/2010-presented with ACS and minimal troponin elevation:70% LAD, 80% distal circumflex, 80% proximal ramus branch vessel,in-stent restenosis of 70% in the RCA; BMS for proximal critical RCA stenosis, restenosis Nov 2012  . Heart attack ([place])   . History of noncompliance with medical treatment    Due to financial considerations  . Hyperlipidemia   . Hypertension   . Tobacco abuse    40 pack years    Past Surgical History:  Procedure Laterality Date  . CORONARY ANGIOPLASTY WITH STENT PLACEMENT      Current Outpatient Medications on File Prior to Visit  Medication Sig Dispense Refill  . aspirin EC 81 MG tablet Take 81 mg by mouth daily.    Marland Kitchen atorvastatin (LIPITOR) 80 MG tablet Take 1 tablet (80 mg total) by mouth daily. 90 tablet 3  . carvedilol (COREG) 25 MG tablet Take 1 tablet (25 mg total) by mouth 2 (two) times daily with a  meal. 180 tablet 3  . clopidogrel (PLAVIX) 75 MG tablet Take 1 tablet (75 mg total) by mouth daily. 90 tablet 3  . doxycycline (VIBRA-TABS) 100 MG tablet Take 1 tablet (100 mg total) by mouth 2 (two) times daily. (Patient not taking: Reported on 03/20/2017) 14 tablet 0  . HYDROcodone-acetaminophen (NORCO/VICODIN) 5-325 MG tablet Take 1 tablet by mouth every 4 (four) hours as needed. 20 tablet 0  . naproxen (NAPROSYN) 500 MG tablet Take 1 tablet (500 mg total) by mouth 2 (two) times daily. 30 tablet 0  . nitroGLYCERIN (NITROSTAT) 0.4 MG SL tablet Place 1 tablet (0.4 mg total) under the tongue every 5 (five) minutes as needed for chest pain. (Patient not taking: Reported on 06/19/2017) 30 tablet 2  . predniSONE (DELTASONE) 10 MG tablet Take 6 tablets day one, 5 tablets day two, 4 tablets day three, 3 tablets day four, 2 tablets day five, then 1 tablet day six 21 tablet 0   Current Facility-Administered Medications on File Prior to Visit  Medication Dose Route Frequency Provider Last Rate Last Dose  . Influenza (>/= 3 years) inactive virus vaccine (FLVIRIN/FLUZONE) injection SUSP 0.5 mL  0.5 mL Intramuscular Once Fayrene Helper, MD        Social History   Socioeconomic History  . Marital status: Widowed    Spouse name: Not on file  . Number of children: 2  . Years  of education: Not on file  . Highest education level: Not on file  Social Needs  . Financial resource strain: Not on file  . Food insecurity - worry: Not on file  . Food insecurity - inability: Not on file  . Transportation needs - medical: Not on file  . Transportation needs - non-medical: Not on file  Occupational History  . 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 Smoking status: Current Every Day Smoker    Packs/day: 1.00    Years: 27.00    Pack years: 27.00    Types: Cigarettes  . Smokeless tobacco: Never Used  Substance and Sexual Activity  . Alcohol use:  No    Comment:  Alcoholism- quit 2002  . Drug use: No  . Sexual activity: Not on file  Other Topics Concern  . Not on file  Social History Narrative             Family History  Problem Relation Age of Onset  . Depression Mother 9       Suicide  . Heart disease Father 80       Deceased from massive heart-attack  . Hyperlipidemia Father   . Hypertension Father   . COPD Maternal Grandfather   . Cancer Paternal Grandmother        Male Cancer  . Heart disease Paternal Grandmother     BP (!) 154/102   Pulse 81   Temp (!) 97 F (36.1 C)   Ht 5\' 11"  (1.803 m)   Wt 272 lb (123.4 kg)   BMI 37.94 kg/m      Objective:   Physical Exam  Constitutional: He is oriented to person, place, and time. He appears well-developed and well-nourished.  HENT:  Head: Normocephalic and atraumatic.  Eyes: Conjunctivae and EOM are normal. Pupils are equal, round, and reactive to light.  Neck: Normal range of motion. Neck supple.  Cardiovascular: Normal rate, regular rhythm and intact distal pulses.  Pulmonary/Chest: Effort normal.  Abdominal: Soft.  Musculoskeletal: He exhibits tenderness (Right knee with slight effusion, ROM 0 to 105 with pain. positive medial McMurray, crepitus, limp to the right, NV intact.).  Neurological: He is alert and oriented to person, place, and time. He has normal reflexes. No cranial nerve deficit. He exhibits normal muscle tone. Coordination normal.  Skin: Skin is warm and dry.  Psychiatric: He has a normal mood and affect. His behavior is normal. Judgment and thought content normal.  Vitals reviewed.   He has pain of the right great toe without redness.  There is history of gout in the family.      Assessment & Plan:   Encounter Diagnoses  Name Primary?  . Chronic pain of right knee Yes  . Gout, unspecified cause, unspecified chronicity, unspecified site   . Cigarette nicotine dependence without complication    I will get MRI of the right knee.  I  am concerned about meniscus tear.  I will get serum uric acid level done.  Return after MRI.  Rx for Naprosyn to be called in.  I have injected the knee today.  PROCEDURE NOTE:  The patient requests injections of the right knee , verbal consent was obtained.  The right knee was prepped appropriately after time out was performed.   Sterile technique was observed and injection of 1 cc of Depo-Medrol 40 mg with several cc's of plain xylocaine. Anesthesia was provided by ethyl chloride and a  20-gauge needle was used to inject the knee area. The injection was tolerated well.  A band aid dressing was applied.  The patient was advised to apply ice later today and tomorrow to the injection sight as needed.  Call if any problem.  Precautions discussed.   Electronically Signed Sanjuana Kava, MD 11/13/20189:17 AM

## 2017-09-04 NOTE — Patient Instructions (Signed)
Steps to Quit Smoking Smoking tobacco can be bad for your health. It can also affect almost every organ in your body. Smoking puts you and people around you at risk for many serious Broad[place] Costilla-lasting (chronic) diseases. Quitting smoking is hard, but it is one of the best things that you can do for your health. It is never too late to quit. What are the benefits of quitting smoking? When you quit smoking, you lower your risk for getting serious diseases and conditions. They can include:  Lung cancer or lung disease.  Heart disease.  Stroke.  Heart attack.  Not being able to have children (infertility).  Weak bones (osteoporosis) and broken bones (fractures).  If you have coughing, wheezing, and shortness of breath, those symptoms may get better when you quit. You may also get sick less often. If you are pregnant, quitting smoking can help to lower your chances of having a baby of low birth weight. What can I do to help me quit smoking? Talk with your doctor about what can help you quit smoking. Some things you can do (strategies) include:  Quitting smoking totally, instead of slowly cutting back how much you smoke over a period of time.  Going to in-person counseling. You are more likely to quit if you go to many counseling sessions.  Using resources and support systems, such as: ? Online chats with a counselor. ? Phone quitlines. ? Printed self-help materials. ? Support groups or group counseling. ? Text messaging programs. ? Mobile phone apps or applications.  Taking medicines. Some of these medicines may have nicotine in them. If you are pregnant or breastfeeding, do not take any medicines to quit smoking unless your doctor says it is okay. Talk with your doctor about counseling or other things that can help you.  Talk with your doctor about using more than one strategy at the same time, such as taking medicines while you are also going to in-person counseling. This can help make  quitting easier. What things can I do to make it easier to quit? Quitting smoking might feel very hard at first, but there is a lot that you can do to make it easier. Take these steps:  Talk to your family and friends. Ask them to support and encourage you.  Call phone quitlines, reach out to support groups, or work with a counselor.  Ask people who smoke to not smoke around you.  Avoid places that make you want (trigger) to smoke, such as: ? Bars. ? Parties. ? Smoke-break areas at work.  Spend time with people who do not smoke.  Lower the stress in your life. Stress can make you want to smoke. Try these things to help your stress: ? Getting regular exercise. ? Deep-breathing exercises. ? Yoga. ? Meditating. ? Doing a body scan. To do this, close your eyes, focus on one area of your body at a time from head to toe, and notice which parts of your body are tense. Try to relax the muscles in those areas.  Download or buy apps on your mobile phone or tablet that can help you stick to your quit plan. There are many free apps, such as QuitGuide from the CDC (Centers for Disease Control and Prevention). You can find more support from smokefree.gov and other websites.  This information is not intended to replace advice given to you by your health care provider. Make sure you discuss any questions you have with your health care provider. Document Released: 08/05/2009 Document   Revised: 06/06/2016 Document Reviewed: 02/23/2015 Elsevier Interactive Patient Education  2018 Elsevier Inc.  

## 2017-09-11 ENCOUNTER — Other Ambulatory Visit: Payer: Self-pay | Admitting: Radiology

## 2017-09-20 ENCOUNTER — Ambulatory Visit: Payer: 59 | Admitting: Orthopaedic Surgery

## 2017-09-25 ENCOUNTER — Encounter: Payer: Self-pay | Admitting: Orthopaedic Surgery

## 2017-09-25 ENCOUNTER — Ambulatory Visit: Payer: Managed Care, Other (non HMO) | Admitting: Orthopaedic Surgery

## 2017-09-25 VITALS — BP 117/85 | HR 78 | Temp 98.2°F | Ht 69.0 in | Wt 275.0 lb

## 2017-09-25 DIAGNOSIS — M109 Gout, unspecified: Secondary | ICD-10-CM

## 2017-09-25 DIAGNOSIS — G8929 Other chronic pain: Secondary | ICD-10-CM | POA: Diagnosis not present

## 2017-09-25 DIAGNOSIS — M25561 Pain in right knee: Secondary | ICD-10-CM | POA: Diagnosis not present

## 2017-09-25 MED ORDER — ALLOPURINOL 300 MG PO TABS: 300.0000 mg | ORAL_TABLET | Freq: Every day | ORAL | 5 refills | Status: DC

## 2017-09-25 NOTE — Progress Notes (Signed)
Patient WI:OXBDZ Brandon [name], male DOB:1970-11-03, 46 y.o. HGD:[id number]  Chief Complaint  Patient presents with  . Results    MRI review right knee    HPI  Brandon [name] is a 46 y.o. male who has right knee pain.  I did aspiration and injection last visit.  He had serum uric acid drawn as I suspected gout.  He had MRI done of the right knee in [place].  MRI shows possible medial meniscal tear.  Serum Uric acid was 7.7.  I had put him on Uloric once I got the report.  He has less pain.  He has no other joint pain.  I have gone over once again importance of taking daily medicine for the gout and what gout is.  He and his wife appear to understand.  I have called in [facility]opurinol to the [facility].  I will give another weeks sample of Uloric as well, 40 mgm.  I have explained the findings of the MRI.  He has less pain.  His knee still hurts some but not as much.  I will see how he does.  He may need arthroscopy if not improved.  Return in one month. HPI  Body mass index is 40.61 kg/m.  ROS  Review of Systems  HENT: Negative for congestion.   Respiratory: Negative for cough and shortness of breath.   Cardiovascular: Negative for chest pain and leg swelling.  Endocrine: Negative for cold intolerance.  Musculoskeletal: Positive for arthralgias, gait problem and joint swelling.  Allergic/Immunologic: Negative for environmental allergies.  All other systems reviewed and are negative.   Past Medical History:  Diagnosis Date  . Arteriosclerotic cardiovascular disease (ASCVD) 2006, 2012   2006-acute IMI treated with urgent RCA stent; 2007-Cutting Balloon for in-stent restenosis; 02/2010-presented with ACS and minimal troponin elevation:70% LAD, 80% distal circumflex, 80% proximal ramus branch vessel,in-stent restenosis of 70% in the RCA; BMS for proximal critical RCA stenosis, restenosis Nov 2012  . Heart attack ([place])   . History of noncompliance with medical treatment    Due to financial  considerations  . Hyperlipidemia   . Hypertension   . Tobacco abuse    40 pack years    Past Surgical History:  Procedure Laterality Date  . CORONARY ANGIOPLASTY WITH STENT PLACEMENT      Family History  Problem Relation Age of Onset  . Depression Mother 45       Suicide  . Heart disease Father 65       Deceased from massive heart-attack  . Hyperlipidemia Father   . Hypertension Father   . COPD Maternal Grandfather   . Cancer Paternal Grandmother        Male Cancer  . Heart disease Paternal Grandmother     Social History Social History   Tobacco Use  . Smoking status: Current Every Day Smoker    Packs/day: 1.00    Years: 27.00    Pack years: 27.00    Types: Cigarettes  . Smokeless tobacco: Never Used  Substance Use Topics  . Alcohol use: No    Comment:  Alcoholism- quit 2002  . Drug use: No    No Known Allergies  Current Outpatient Medications  Medication Sig Dispense Refill  . aspirin EC 81 MG tablet Take 81 mg by mouth daily.    Marland Kitchen atorvastatin (LIPITOR) 80 MG tablet Take 1 tablet (80 mg total) by mouth daily. 90 tablet 3  . carvedilol (COREG) 25 MG tablet Take 1 tablet (25 mg total) by  mouth 2 (two) times daily with a meal. 180 tablet 3  . clopidogrel (PLAVIX) 75 MG tablet Take 1 tablet (75 mg total) by mouth daily. 90 tablet 3  . HYDROcodone-acetaminophen (NORCO/VICODIN) 5-325 MG tablet Take 1 tablet by mouth every 4 (four) hours as needed. 20 tablet 0  . naproxen (NAPROSYN) 500 MG tablet Take 1 tablet (500 mg total) by mouth 2 (two) times daily. 30 tablet 0  . naproxen (NAPROSYN) 500 MG tablet Take 1 tablet (500 mg total) 2 (two) times daily with a meal by mouth. 60 tablet 5  . allopurinol (ZYLOPRIM) 300 MG tablet Take 1 tablet (300 mg total) by mouth daily. 30 tablet 5  . nitroGLYCERIN (NITROSTAT) 0.4 MG SL tablet Place 1 tablet (0.4 mg total) under the tongue every 5 (five) minutes as needed for chest pain. (Patient not taking: Reported on 06/19/2017) 30  tablet 2  . predniSONE (DELTASONE) 10 MG tablet Take 6 tablets day one, 5 tablets day two, 4 tablets day three, 3 tablets day four, 2 tablets day five, then 1 tablet day six 21 tablet 0   Current Facility-Administered Medications  Medication Dose Route Frequency Provider Last Rate Last Dose  . Influenza (>/= 3 years) inactive virus vaccine (FLVIRIN/FLUZONE) injection SUSP 0.5 mL  0.5 mL Intramuscular Once Fayrene Helper, MD         Physical Exam  Blood pressure 117/85, pulse 78, temperature 98.2 F (36.8 C), height 5\' 9"  (1.753 m), weight 275 lb (124.7 kg).  Constitutional: overall normal hygiene, normal nutrition, well developed, normal grooming, normal body habitus. Assistive device:none  Musculoskeletal: gait and station Limp slightly to the right, muscle tone and strength are normal, no tremors or atrophy is present.  .  Neurological: coordination overall normal.  Deep tendon reflex/nerve stretch intact.  Sensation normal.  Cranial nerves II-XII intact.   Skin:   Normal overall no scars, lesions, ulcers or rashes. No psoriasis.  Psychiatric: Alert and oriented x 3.  Recent memory intact, remote memory unclear.  Normal mood and affect. Well groomed.  Good eye contact.  Cardiovascular: overall no swelling, no varicosities, no edema bilaterally, normal temperatures of the legs and arms, no clubbing, cyanosis and good capillary refill.  Lymphatic: palpation is normal.  All other systems reviewed and are negative   The right lower extremity is examined:  Inspection:  Thigh:  Non-tender and no defects  Knee has swelling 1/2+ effusion.                        Joint tenderness is present                        Patient is tender over the medial joint line  Lower Leg:  Has normal appearance and no tenderness or defects  Ankle:  Non-tender and no defects  Foot:  Non-tender and no defects Range of Motion:  Knee:  Range of motion is: 0-110                        Crepitus is   present  Ankle:  Range of motion is normal. Strength and Tone:  The right lower extremity has normal strength and tone. Stability:  Knee:  The knee is stable.  Ankle:  The ankle is stable.   The patient has been educated about the nature of the problem(s) and counseled on treatment options.  The patient appeared to understand  what I have discussed and is in agreement with it.  Encounter Diagnoses  Name Primary?  . Chronic pain of right knee Yes  . Gout, unspecified cause, unspecified chronicity, unspecified site     PLAN Call if any problems.  Precautions discussed.  Allopurinol called in.  Return to clinic 1 month   Electronically Signed Sanjuana Kava, MD 12/4/20188:32 AM

## 2017-09-25 NOTE — Patient Instructions (Signed)
Steps to Quit Smoking Smoking tobacco can be bad for your health. It can also affect almost every organ in your body. Smoking puts you and people around you at risk for many serious long-lasting (chronic) diseases. Quitting smoking is hard, but it is one of the best things that you can do for your health. It is never too late to quit. What are the benefits of quitting smoking? When you quit smoking, you lower your risk for getting serious diseases and conditions. They can include:  Lung cancer or lung disease.  Heart disease.  Stroke.  Heart attack.  Not being able to have children (infertility).  Weak bones (osteoporosis) and broken bones (fractures).  If you have coughing, wheezing, and shortness of breath, those symptoms may get better when you quit. You may also get sick less often. If you are pregnant, quitting smoking can help to lower your chances of having a baby of low birth weight. What can I do to help me quit smoking? Talk with your doctor about what can help you quit smoking. Some things you can do (strategies) include:  Quitting smoking totally, instead of slowly cutting back how much you smoke over a period of time.  Going to in-person counseling. You are more likely to quit if you go to many counseling sessions.  Using resources and support systems, such as: ? Online chats with a counselor. ? Phone quitlines. ? Printed self-help materials. ? Support groups or group counseling. ? Text messaging programs. ? Mobile phone apps or applications.  Taking medicines. Some of these medicines may have nicotine in them. If you are pregnant or breastfeeding, do not take any medicines to quit smoking unless your doctor says it is okay. Talk with your doctor about counseling or other things that can help you.  Talk with your doctor about using more than one strategy at the same time, such as taking medicines while you are also going to in-person counseling. This can help make  quitting easier. What things can I do to make it easier to quit? Quitting smoking might feel very hard at first, but there is a lot that you can do to make it easier. Take these steps:  Talk to your family and friends. Ask them to support and encourage you.  Call phone quitlines, reach out to support groups, or work with a counselor.  Ask people who smoke to not smoke around you.  Avoid places that make you want (trigger) to smoke, such as: ? Bars. ? Parties. ? Smoke-break areas at work.  Spend time with people who do not smoke.  Lower the stress in your life. Stress can make you want to smoke. Try these things to help your stress: ? Getting regular exercise. ? Deep-breathing exercises. ? Yoga. ? Meditating. ? Doing a body scan. To do this, close your eyes, focus on one area of your body at a time from head to toe, and notice which parts of your body are tense. Try to relax the muscles in those areas.  Download or buy apps on your mobile phone or tablet that can help you stick to your quit plan. There are many free apps, such as QuitGuide from the CDC (Centers for Disease Control and Prevention). You can find more support from smokefree.gov and other websites.  This information is not intended to replace advice given to you by your health care provider. Make sure you discuss any questions you have with your health care provider. Document Released: 08/05/2009 Document   Revised: 06/06/2016 Document Reviewed: 02/23/2015 Elsevier Interactive Patient Education  2018 Elsevier Inc.  

## 2017-09-26 ENCOUNTER — Telehealth: Payer: Self-pay | Admitting: Orthopaedic Surgery

## 2017-09-26 NOTE — Telephone Encounter (Signed)
Patient and Neoma Laming, designated contact, relay that the prescription issued yesterday,  09/25/17, is not at [facility] [place] -  allopurinol (ZYLOPRIM) 300 MG tablet 30 tablet   please advise.

## 2017-09-27 MED ORDER — ALLOPURINOL 300 MG PO TABS: 300.0000 mg | ORAL_TABLET | Freq: Every day | ORAL | 5 refills | Status: DC

## 2017-10-08 ENCOUNTER — Telehealth: Payer: Self-pay | Admitting: Orthopaedic Surgery

## 2017-10-08 NOTE — Telephone Encounter (Signed)
Patient's wife, Neoma Laming called today stating that Brandon [name] needs more samples of Uloric.  She asked if he could get 2 boxes instead of one.  She said one box only lasts for one week.  Please advise  Thanks

## 2017-10-09 NOTE — Telephone Encounter (Signed)
I gave Rx for allopurinol.  That replaces the Uloric.  No more Uloric.  Continue the allopurinol.

## 2017-10-25 ENCOUNTER — Ambulatory Visit: Payer: Managed Care, Other (non HMO) | Admitting: Orthopaedic Surgery

## 2017-12-13 ENCOUNTER — Telehealth: Payer: Self-pay | Admitting: Orthopaedic Surgery

## 2017-12-13 NOTE — Telephone Encounter (Signed)
Patient and spouse called to request Indocin - states Dr Luna Glasgow has not previously prescribed, however, would like to know if he may have prescription for this medication for flare-ups of gout  - [place]

## 2017-12-14 ENCOUNTER — Ambulatory Visit
Admission: RE | Admit: 2017-12-14 | Discharge: 2017-12-14 | Disposition: A | Discharge status: Home / Self Care | Payer: Managed Care, Other (non HMO) | Source: Ambulatory Visit | Attending: Family Medicine | Admitting: Family Medicine

## 2017-12-14 ENCOUNTER — Encounter: Payer: Self-pay | Admitting: Family Medicine

## 2017-12-14 ENCOUNTER — Ambulatory Visit (INDEPENDENT_AMBULATORY_CARE_PROVIDER_SITE_OTHER): Payer: Managed Care, Other (non HMO) | Admitting: Family Medicine

## 2017-12-14 VITALS — BP 132/84 | HR 80 | Temp 98.1°F | Wt 286.0 lb

## 2017-12-14 DIAGNOSIS — Z6837 Body mass index (BMI) 37.0-37.9, adult: Secondary | ICD-10-CM

## 2017-12-14 DIAGNOSIS — I2510 Atherosclerotic heart disease of native coronary artery without angina pectoris: Secondary | ICD-10-CM

## 2017-12-14 DIAGNOSIS — R059 Cough, unspecified: Secondary | ICD-10-CM

## 2017-12-14 DIAGNOSIS — F325 Major depressive disorder, single episode, in full remission: Secondary | ICD-10-CM | POA: Diagnosis not present

## 2017-12-14 DIAGNOSIS — R05 Cough: Secondary | ICD-10-CM | POA: Diagnosis not present

## 2017-12-14 DIAGNOSIS — H7392 Unspecified disorder of tympanic membrane, left ear: Secondary | ICD-10-CM | POA: Diagnosis not present

## 2017-12-14 DIAGNOSIS — E785 Hyperlipidemia, unspecified: Secondary | ICD-10-CM | POA: Diagnosis not present

## 2017-12-14 DIAGNOSIS — I10 Essential (primary) hypertension: Secondary | ICD-10-CM | POA: Diagnosis not present

## 2017-12-14 DIAGNOSIS — Z9119 Patient's noncompliance with other medical treatment and regimen: Secondary | ICD-10-CM | POA: Diagnosis not present

## 2017-12-14 DIAGNOSIS — Z720 Tobacco use: Secondary | ICD-10-CM | POA: Diagnosis not present

## 2017-12-14 DIAGNOSIS — Z91199 Patient's noncompliance with other medical treatment and regimen due to unspecified reason: Secondary | ICD-10-CM

## 2017-12-14 MED ORDER — LEVOFLOXACIN 500 MG PO TABS: 500.0000 mg | ORAL_TABLET | Freq: Every day | ORAL | 0 refills | Status: DC

## 2017-12-14 NOTE — Progress Notes (Signed)
   Subjective:    Patient ID: Brandon [name], male    DOB: June 12, 1971, 47 y.o.   MRN: [id number]  HPI He complains of a 2-day history of coughing and rhinorrhea and slight shortness of breath.  Yesterday the shortness of breath got worse and he had a productive cough as well as fatigue and myalgias but no fever, chills, earache or sore throat.  He continues to smoke a pack and 1/2/day.   He then complained of bilateral leg edema and mentioned that he is also taking allopurinol for gout.  Further questioning indicates previous history of ASHD with stenting.  He he has been back in [place] since 2016 but is not seen cardiology in the last several years.  He does not complain of chest pain, PND or DOE.  No orthopnea. He also mentioned a several year history of decreased hearing from the left ear.   Review of Systems     Objective:   Physical Exam Alert and in no distress. Tympanic membrane on the left has poor landmarks and some retraction is noted.,  Right TM normal, canals are normal. Pharyngeal area is normal. Neck is supple without adenopathy or thyromegaly. Cardiac exam shows a regular sinus rhythm without murmurs or gallops. Lungs show rhonchi in the right mid lung field.  Lower extremities show 2-3+ pitting edema. 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 Chest x-ray was negative.       Assessment & Plan:  Arteriosclerotic cardiovascular disease (ASCVD) - Plan: EKG 12-Lead, CBC with Differential/Platelet, Comprehensive metabolic panel, Lipid panel, Ambulatory referral to Cardiology  History of noncompliance with medical treatment  Tm (tympanic membrane disorder), left - Plan: Ambulatory referral to ENT  Hyperlipidemia, unspecified hyperlipidemia type - Plan: Lipid panel  Essential hypertension - Plan: CBC with Differential/Platelet, Comprehensive metabolic panel  Tobacco abuse  Cough - Plan: CBC with Differential/Platelet, Comprehensive metabolic panel, DG Chest 2 View,  levofloxacin (LEVAQUIN) 500 MG tablet I will get him back into the system for follow-up on his underlying heart disease.  At this point he is not interested in quitting smoking. I will see him back in 1 week to follow-up on his lung disease.  Explained that the swelling in his lower extremities will need to be further evaluated and could possibly cardiac in origin. Recommend Robitussin-DM during the day and NyQuil at night. Discussed the history of gout.  Explained that I am not sure whether he had have not based on the symptoms that he described. Over 45 minutes, greater than 50% spent in counseling and coordination of care.

## 2017-12-15 LAB — CBC WITH DIFFERENTIAL/PLATELET
BASOS ABS: 0.1 10*3/uL (ref 0.0–0.2)
Basos: 1 %
EOS (ABSOLUTE): 0.3 10*3/uL (ref 0.0–0.4)
Eos: 4 %
HEMOGLOBIN: 15 g/dL (ref 13.0–17.7)
Hematocrit: 43.4 % (ref 37.5–51.0)
IMMATURE GRANS (ABS): 0 10*3/uL (ref 0.0–0.1)
Immature Granulocytes: 0 %
LYMPHS ABS: 1.7 10*3/uL (ref 0.7–3.1)
LYMPHS: 21 %
MCH: 32.3 pg (ref 26.6–33.0)
MCHC: 34.6 g/dL (ref 31.5–35.7)
MCV: 94 fL (ref 79–97)
MONOCYTES: 8 %
Monocytes Absolute: 0.6 10*3/uL (ref 0.1–0.9)
Neutrophils Absolute: 5.4 10*3/uL (ref 1.4–7.0)
Neutrophils: 66 %
PLATELETS: 151 10*3/uL (ref 150–379)
RBC: 4.64 x10E6/uL (ref 4.14–5.80)
RDW: 14.4 % (ref 12.3–15.4)
WBC: 8.1 10*3/uL (ref 3.4–10.8)

## 2017-12-15 LAB — COMPREHENSIVE METABOLIC PANEL
ALK PHOS: 113 IU/L (ref 39–117)
ALT: 45 IU/L — AB (ref 0–44)
AST: 29 IU/L (ref 0–40)
Albumin/Globulin Ratio: 1.8 (ref 1.2–2.2)
Albumin: 4.4 g/dL (ref 3.5–5.5)
BILIRUBIN TOTAL: 0.7 mg/dL (ref 0.0–1.2)
BUN/Creatinine Ratio: 13 (ref 9–20)
BUN: 11 mg/dL (ref 6–24)
CHLORIDE: 102 mmol/L (ref 96–106)
CO2: 24 mmol/L (ref 20–29)
CREATININE: 0.83 mg/dL (ref 0.76–1.27)
Calcium: 9.4 mg/dL (ref 8.7–10.2)
GFR calc Af Amer: 120 mL/min/{1.73_m2} (ref >59)
GFR calc non Af Amer: 104 mL/min/{1.73_m2} (ref >59)
GLUCOSE: 78 mg/dL (ref 65–99)
Globulin, Total: 2.5 g/dL (ref 1.5–4.5)
Potassium: 4.6 mmol/L (ref 3.5–5.2)
Sodium: 140 mmol/L (ref 134–144)
Total Protein: 6.9 g/dL (ref 6.0–8.5)

## 2017-12-15 LAB — LIPID PANEL
CHOLESTEROL TOTAL: 145 mg/dL (ref 100–199)
Chol/HDL Ratio: 3.9 ratio (ref 0.0–5.0)
HDL: 37 mg/dL — AB (ref >39)
LDL CALC: 85 mg/dL (ref 0–99)
TRIGLYCERIDES: 117 mg/dL (ref 0–149)
VLDL CHOLESTEROL CAL: 23 mg/dL (ref 5–40)

## 2017-12-17 DIAGNOSIS — H9193 Unspecified hearing loss, bilateral: Secondary | ICD-10-CM | POA: Insufficient documentation

## 2017-12-17 DIAGNOSIS — H6983 Other specified disorders of Eustachian tube, bilateral: Secondary | ICD-10-CM | POA: Insufficient documentation

## 2017-12-17 NOTE — Telephone Encounter (Signed)
No.  Indocin is an old drug that is not recommended for gout at this time for him.  I have explained this to him before.  Make sure is he still taking his allopurinol daily.  I can call in some colchicine for his "acute attack" if he would like.  Send me new request if he would like this.  It works only for gout and takes care of pain.

## 2017-12-19 ENCOUNTER — Emergency Department (HOSPITAL_COMMUNITY): Payer: Managed Care, Other (non HMO)

## 2017-12-19 ENCOUNTER — Encounter (HOSPITAL_COMMUNITY): Payer: Self-pay | Admitting: Emergency Medicine

## 2017-12-19 ENCOUNTER — Emergency Department (HOSPITAL_COMMUNITY)
Admission: EM | Admit: 2017-12-19 | Discharge: 2017-12-19 | Disposition: A | Discharge status: Home / Self Care | Payer: Managed Care, Other (non HMO) | Attending: Emergency Medicine | Admitting: Emergency Medicine

## 2017-12-19 ENCOUNTER — Other Ambulatory Visit: Payer: Self-pay

## 2017-12-19 DIAGNOSIS — F17210 Nicotine dependence, cigarettes, uncomplicated: Secondary | ICD-10-CM | POA: Insufficient documentation

## 2017-12-19 DIAGNOSIS — J209 Acute bronchitis, unspecified: Secondary | ICD-10-CM

## 2017-12-19 DIAGNOSIS — I10 Essential (primary) hypertension: Secondary | ICD-10-CM | POA: Insufficient documentation

## 2017-12-19 DIAGNOSIS — I252 Old myocardial infarction: Secondary | ICD-10-CM | POA: Diagnosis not present

## 2017-12-19 DIAGNOSIS — Z7982 Long term (current) use of aspirin: Secondary | ICD-10-CM | POA: Diagnosis not present

## 2017-12-19 DIAGNOSIS — Z79899 Other long term (current) drug therapy: Secondary | ICD-10-CM | POA: Diagnosis not present

## 2017-12-19 DIAGNOSIS — R05 Cough: Secondary | ICD-10-CM | POA: Diagnosis present

## 2017-12-19 DIAGNOSIS — Z7902 Long term (current) use of antithrombotics/antiplatelets: Secondary | ICD-10-CM | POA: Diagnosis not present

## 2017-12-19 MED ORDER — METHYLPREDNISOLONE SODIUM SUCC 125 MG IJ SOLR
125.0000 mg | Freq: Once | INTRAMUSCULAR | Status: AC
  Administered 2017-12-19: 125 mg via INTRAMUSCULAR
  Filled 2017-12-19: qty 2

## 2017-12-19 MED ORDER — IPRATROPIUM-ALBUTEROL 0.5-2.5 (3) MG/3ML IN SOLN
3.0000 mL | Freq: Once | RESPIRATORY_TRACT | Status: AC
  Administered 2017-12-19: 3 mL via RESPIRATORY_TRACT
  Filled 2017-12-19: qty 3

## 2017-12-19 MED ORDER — ALBUTEROL SULFATE HFA 108 (90 BASE) MCG/ACT IN AERS
2.0000 | INHALATION_SPRAY | Freq: Once | RESPIRATORY_TRACT | Status: AC
  Administered 2017-12-19: 2 via RESPIRATORY_TRACT
  Filled 2017-12-19: qty 6.7

## 2017-12-19 NOTE — ED Triage Notes (Signed)
Pt c/o cough that started about a week ago. Was seen by pcp Monday and was prescribed levofloxacin. Pt states hes not getting any better.

## 2017-12-19 NOTE — ED Provider Notes (Signed)
[facility] EMERGENCY DEPARTMENT Provider Note   CSN: [id number] Arrival date & time: 12/19/17  3220     History   Chief Complaint Chief Complaint  Patient presents with  . Cough    HPI Brandon [name] is a 47 y.o. male.  This patient is a 47 year old male with past medical history of coronary artery disease, hypertension, tobacco abuse, and noncompliance with medical treatment.  He presents today for evaluation of shortness of breath and cough.  This is been ongoing for 1 week.  He had a chest x-ray performed by his primary doctor which did not showed pneumonia, however he was treated with an antibiotic.  This is not helping.  He states that now he is becoming more short of breath and wheezing.  He denies fevers or chills.  He smokes 1-1-1/2 packs/day of cigarettes.   The history is provided by the patient.  Cough  This is a new problem. Episode onset: 1 week ago. The problem has been gradually worsening. The cough is non-productive. There has been no fever. Associated symptoms include shortness of breath and wheezing. Pertinent negatives include no chest pain. He has tried nothing for the symptoms. The treatment provided no relief. He is a smoker.    Past Medical History:  Diagnosis Date  . Arteriosclerotic cardiovascular disease (ASCVD) 2006, 2012   2006-acute IMI treated with urgent RCA stent; 2007-[place] for in-stent restenosis; 02/2010-presented with ACS and minimal troponin elevation:70% LAD, 80% distal circumflex, 80% proximal ramus branch vessel,in-stent restenosis of 70% in the RCA; BMS for proximal critical RCA stenosis, restenosis Nov 2012  . Heart attack ([place])   . History of noncompliance with medical treatment    Due to financial considerations  . Hyperlipidemia   . Hypertension   . Tobacco abuse    40 pack years    Patient Active Problem List   Diagnosis Date Noted  . 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 Sleep apnea 12/29/2012  .  Arteriosclerotic cardiovascular disease (ASCVD)   . Tobacco abuse   . History of noncompliance with medical treatment   . Hyperlipidemia 05/30/2011  . Hypertension 05/30/2011  . Obesity 05/30/2011    Past Surgical History:  Procedure Laterality Date  . CORONARY ANGIOPLASTY WITH STENT PLACEMENT         Home Medications    Prior to Admission medications   Medication Sig Start Date End Date Taking? Authorizing Provider  allopurinol (ZYLOPRIM) 300 MG tablet Take 1 tablet (300 mg total) by mouth daily. 09/27/17   Sanjuana Kava, MD  aspirin EC 81 MG tablet Take 81 mg by mouth daily.    [provider]  atorvastatin (LIPITOR) 80 MG tablet Take 1 tablet (80 mg total) by mouth daily. 06/19/17   Denita Lung, MD  carvedilol (COREG) 25 MG tablet Take 1 tablet (25 mg total) by mouth 2 (two) times daily with a meal. 06/19/17   Denita Lung, MD  clopidogrel (PLAVIX) 75 MG tablet Take 1 tablet (75 mg total) by mouth daily. 06/19/17   Denita Lung, MD  HYDROcodone-acetaminophen (NORCO/VICODIN) 5-325 MG tablet Take 1 tablet by mouth every 4 (four) hours as needed. Patient not taking: Reported on 12/14/2017 07/12/17   Evalee Jefferson, PA-C  levofloxacin (LEVAQUIN) 500 MG tablet Take 1 tablet (500 mg total) by mouth daily. 12/14/17   Denita Lung, MD  naproxen (NAPROSYN) 500 MG tablet Take 1 tablet (500 mg total) by mouth 2 (two) times daily. 07/12/17  Evalee Jefferson, PA-C  naproxen (NAPROSYN) 500 MG tablet Take 1 tablet (500 mg total) 2 (two) times daily with a meal by mouth. Patient not taking: Reported on 12/14/2017 09/04/17   Sanjuana Kava, MD  nitroGLYCERIN (NITROSTAT) 0.4 MG SL tablet Place 1 tablet (0.4 mg total) under the tongue every 5 (five) minutes as needed for chest pain. Patient not taking: Reported on 06/19/2017 03/27/16   Earleen Newport, MD  predniSONE (DELTASONE) 10 MG tablet Take 6 tablets day one, 5 tablets day two, 4 tablets day three, 3 tablets day four, 2 tablets day  five, then 1 tablet day six Patient not taking: Reported on 12/14/2017 07/12/17   Evalee Jefferson, PA-C    Family History Family History  Problem Relation Age of Onset  . Depression Mother 55       Suicide  . Heart disease Father 37       Deceased from massive heart-attack  . Hyperlipidemia Father   . Hypertension Father   . COPD Maternal Grandfather   . Cancer Paternal Grandmother        Male Cancer  . Heart disease Paternal Grandmother     Social History Social History   Tobacco Use  . Smoking status: Current Every Day Smoker    Packs/day: 1.00    Years: 27.00    Pack years: 27.00    Types: Cigarettes  . Smokeless tobacco: Never Used  Substance Use Topics  . Alcohol use: No    Comment:  Alcoholism- quit 2002  . Drug use: No     Allergies   Patient has no known allergies.   Review of Systems Review of Systems  Respiratory: Positive for cough, shortness of breath and wheezing.   Cardiovascular: Negative for chest pain.  All other systems reviewed and are negative.    Physical Exam Updated Vital Signs BP (!) 144/81 (BP Location: Right Arm)   Pulse 72   Temp 97.7 F (36.5 C) (Oral)   Resp 20   Ht 5\' 9"  (1.753 m)   Wt 129.3 kg (285 lb)   SpO2 97%   BMI 42.09 kg/m   Physical Exam  Constitutional: He is oriented to person, place, and time. He appears well-developed and well-nourished. No distress.  HENT:  Head: Normocephalic and atraumatic.  Mouth/Throat: Oropharynx is clear and moist.  Neck: Normal range of motion. Neck supple.  Cardiovascular: Normal rate and regular rhythm. Exam reveals no friction rub.  No murmur heard. Pulmonary/Chest: Effort normal. No respiratory distress. He has wheezes. He has no rales.  There are expiratory wheezes present bilaterally.  He is in no respiratory distress and is able to complete sentences.  Abdominal: Soft. Bowel sounds are normal. He exhibits no distension. There is no tenderness.  Musculoskeletal: Normal range  of motion. He exhibits no edema.  Neurological: He is alert and oriented to person, place, and time. Coordination normal.  Skin: Skin is warm and dry. He is not diaphoretic.  Nursing note and vitals reviewed.    ED Treatments / Results  Labs (all labs ordered are listed, but only abnormal results are displayed) Labs Reviewed - No data to display  EKG  EKG Interpretation None       Radiology No results found.  Procedures Procedures (including critical care time)  Medications Ordered in ED Medications  ipratropium-albuterol (DUONEB) 0.5-2.5 (3) MG/3ML nebulizer solution 3 mL (not administered)  methylPREDNISolone sodium succinate (SOLU-MEDROL) 125 mg/2 mL injection 125 mg (not administered)     Initial Impression /  Assessment and Plan / ED Course  I have reviewed the triage vital signs and the nursing notes.  Pertinent labs & imaging results that were available during my care of the patient were reviewed by me and considered in my medical decision making (see chart for details).  Patient feeling better after breathing treatment.  Chest x-ray does not reveal an infiltrate.  He is currently taking Levaquin.  I suspect a viral illness with underlying, undiagnosed COPD.  I believe he is appropriate for discharge with albuterol MDI, prednisone, and follow-up with PCP.  Final Clinical Impressions(s) / ED Diagnoses   Final diagnoses:  None    ED Discharge Orders    None       Veryl Speak, MD 12/19/17 [phone]

## 2017-12-19 NOTE — Discharge Instructions (Signed)
Continue Levaquin as previously prescribed.  Prednisone as prescribed.  Albuterol inhaler 2 puffs every 4 hours as needed for wheezing.  Follow-up with your primary doctor if not improving in the next 2-3 days, and return to the ER if symptoms significantly worsen or change.

## 2017-12-25 ENCOUNTER — Telehealth: Payer: Self-pay

## 2017-12-25 NOTE — Telephone Encounter (Signed)
Called pt wife to let her know of cardio. Apt with Dr. Bronson Ing on 02-08-18. Pt was given phone number [phone] and address [address]. Thanks [employer]

## 2017-12-26 ENCOUNTER — Ambulatory Visit: Payer: Managed Care, Other (non HMO) | Admitting: Family Medicine

## 2018-01-11 ENCOUNTER — Telehealth: Payer: Self-pay | Admitting: Family Medicine

## 2018-01-11 NOTE — Telephone Encounter (Addendum)
Wife called and states pt has appointment 01/15/18 with Dr. Gwenlyn Found Cardiologist on [address] and he needs a stress test at that time for his new job and they are requiring a referral for the stress test.

## 2018-01-11 NOTE — Telephone Encounter (Signed)
PT NEEDS A REFERRAL FOR STRESS TEST. PLEASE ADVISE IF ORDER CAN BE PLACED DUE TO JOB CHANGE. THANKS [place]

## 2018-01-11 NOTE — Telephone Encounter (Signed)
Referall was change to Dr. Gwenlyn Found office and still awaiting info about stress test. [facility]

## 2018-01-14 ENCOUNTER — Telehealth: Payer: Self-pay

## 2018-01-14 NOTE — Telephone Encounter (Signed)
Pt wife called about a stress test. Pt needs it for a job. Pt referral was put in for Dr. Gwenlyn Found and will call their office to see if they would need anything for the stress test . Office has advised me that an order would need to put in for pt to have it done. Thanks [employer]

## 2018-01-15 ENCOUNTER — Encounter: Payer: Self-pay | Admitting: Cardiovascular Disease

## 2018-01-15 ENCOUNTER — Ambulatory Visit: Payer: Managed Care, Other (non HMO) | Admitting: Cardiovascular Disease

## 2018-01-15 VITALS — BP 133/84 | HR 78 | Ht 69.0 in | Wt 288.8 lb

## 2018-01-15 DIAGNOSIS — I2510 Atherosclerotic heart disease of native coronary artery without angina pectoris: Secondary | ICD-10-CM | POA: Diagnosis not present

## 2018-01-15 DIAGNOSIS — Z720 Tobacco use: Secondary | ICD-10-CM

## 2018-01-15 NOTE — Assessment & Plan Note (Signed)
Ongoing tobacco abuse of one to one half packs a day recalcitrant risk factor modification.

## 2018-01-15 NOTE — Assessment & Plan Note (Signed)
History of essential hypertension with blood pressure today 133/84. He is on carvedilol. Continue current meds at current dosing.

## 2018-01-15 NOTE — Assessment & Plan Note (Signed)
History of hyperlipidemia on high-dose statin therapy with lipid profile performed 12/14/17 revealing a LDL of 85 and HDL of 37

## 2018-01-15 NOTE — Patient Instructions (Signed)
Medication Instructions: Your physician recommends that you continue on your current medications as directed. Please refer to the Current Medication list given to you today.   Testing/Procedures: Your physician has requested that you have an exercise tolerance test. For further information please visit [email or web address]. Please also follow instruction sheet, as given.  Your physician has requested that you have an echocardiogram. Echocardiography is a painless test that uses sound waves to create images of your heart. It provides your doctor with information about the size and shape of your heart and how well your heart's chambers and valves are working. This procedure takes approximately one hour. There are no restrictions for this procedure.  (For DOT Clearance)  Follow-Up: Your physician recommends that you schedule a follow-up appointment as needed with Dr. Gwenlyn Found.

## 2018-01-15 NOTE — Assessment & Plan Note (Signed)
History of CAD status post inferior myocardial infarction in 2006 treated with urgent stenting by myself of his RCA. He had repeat angiography in 2007 revealing in-stent restenosis and was re-intervened on at that time. He was admitted 02/24/10 with a non-STEMI and underwent cardiac catheterization by Dr. Elisabeth Cara revealing a 70% "in-stent restenosis as well as a 99% proximal RCA lesion both of which were intervened on by myself.He denies chest pain or shortness of breath.

## 2018-01-15 NOTE — Progress Notes (Signed)
01/15/2018 Brandon [name]   Apr 04, 1971  [id number]  Primary Physician Denita Lung, MD Primary Cardiologist: Lorretta Harp MD Lupe Carney, [place]  HPI:  Brandon [name] is a 47 y.o. 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 He was initially referred by Dr. Redmond School because of lower extremity edema which has since resolved after changing jobs. 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 His risk factors include 1-1/2 packs a day of tobacco abuse having smoked 35 years as well as treated hypertension and hyperlipidemia. His father had a heart attack at age 48 and died. 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 He was recathed here later found to have in-stent restenosis as well as recently intervened on. I Him 02/24/10 the setting of a non-STEMI revealed revealing a new 75% in-stent restenosis within the RCA stent and a new 99% proximal lesion both of which were intervened on. He's had no problems since.   Current Meds  Medication Sig  . allopurinol (ZYLOPRIM) 300 MG tablet Take 1 tablet (300 mg total) by mouth daily.  Marland Kitchen aspirin EC 81 MG tablet Take 81 mg by mouth daily.  Marland Kitchen atorvastatin (LIPITOR) 80 MG tablet Take 1 tablet (80 mg total) by mouth daily.  . carvedilol (COREG) 25 MG tablet Take 1 tablet (25 mg total) by mouth 2 (two) times daily with a meal.  . clopidogrel (PLAVIX) 75 MG tablet Take 1 tablet (75 mg total) by mouth daily.  Marland Kitchen HYDROcodone-acetaminophen (NORCO/VICODIN) 5-325 MG tablet Take 1 tablet by mouth every 4 (four) hours as needed.  Marland Kitchen levofloxacin (LEVAQUIN) 500 MG tablet Take 1 tablet (500 mg total) by mouth daily.  . naproxen (NAPROSYN) 500 MG tablet Take 1 tablet (500 mg total) by mouth 2 (two) times daily.  . naproxen (NAPROSYN) 500 MG tablet Take 1 tablet (500 mg  total) 2 (two) times daily with a meal by mouth.  . nitroGLYCERIN (NITROSTAT) 0.4 MG SL tablet Place 1 tablet (0.4 mg total) under the tongue every 5 (five) minutes as needed for chest pain.  . predniSONE (DELTASONE) 10 MG tablet Take 6 tablets day one, 5 tablets day two, 4 tablets day three, 3 tablets day four, 2 tablets day five, then 1 tablet day six   Current Facility-Administered Medications for the 01/15/18 encounter (Office Visit) with Lorretta Harp, MD  Medication  . Influenza (>/= 3 years) inactive virus vaccine (FLVIRIN/FLUZONE) injection SUSP 0.5 mL     No Known Allergies  Social History   Socioeconomic History  . Marital status: Widowed    Spouse name: Not on file  . Number of children: 2  . Years of education: Not on file  . Highest education level: Not on file  Occupational History  . Occupation: Disabled    Comment: Previously employed as an Cytogeneticist  . Financial resource strain: Not on file  . Food insecurity:    Worry: Not on file    Inability: Not on file  . Transportation needs:    Medical: Not on file    Non-medical: Not on file  Tobacco Use  . Smoking status: Current Every Day Smoker    Packs/day: 1.00    Years: 27.00    Pack years: 27.00    Types: Cigarettes  .  Smokeless tobacco: Never Used  Substance and Sexual Activity  . Alcohol use: No    Comment:  Alcoholism- quit 2002  . Drug use: No  . Sexual activity: Not on file  Lifestyle  . Physical activity:    Days per week: Not on file    Minutes per session: Not on file  . Stress: Not on file  Relationships  . Social connections:    Talks on phone: Not on file    Gets together: Not on file    Attends religious service: Not on file    Active member of club or organization: Not on file    Attends meetings of clubs or organizations: Not on file    Relationship status: Not on file  . Intimate partner violence:    Fear of current or ex partner: Not on file     Emotionally abused: Not on file    Physically abused: Not on file    Forced sexual activity: Not on file  Other Topics Concern  . Not on file  Social History Narrative              Review of Systems: General: negative for chills, fever, night sweats or weight changes.  Cardiovascular: negative for chest pain, dyspnea on exertion, edema, orthopnea, palpitations, paroxysmal nocturnal dyspnea or shortness of breath Dermatological: negative for rash Respiratory: negative for cough or wheezing Urologic: negative for hematuria Abdominal: negative for nausea, vomiting, diarrhea, bright red blood per rectum, melena, or hematemesis Neurologic: negative for visual changes, syncope, or dizziness All other systems reviewed and are otherwise negative except as noted above.    Blood pressure 133/84, pulse 78, height 5\' 9"  (1.753 m), weight 288 lb 12.8 oz (131 kg).  General appearance: alert and no distress Neck: no adenopathy, no carotid bruit, no JVD, supple, symmetrical, trachea midline and thyroid not enlarged, symmetric, no tenderness/mass/nodules Lungs: clear to auscultation bilaterally Heart: regular rate and rhythm, S1, S2 normal, no murmur, click, rub or gallop Extremities: extremities normal, atraumatic, no cyanosis or edema Pulses: 2+ and symmetric Skin: Skin color, texture, turgor normal. No rashes or lesions Neurologic: Alert and oriented X 3, normal strength and tone. Normal symmetric reflexes. Normal coordination and gait  EKG not performed today  ASSESSMENT AND PLAN:   Hyperlipidemia History of hyperlipidemia on high-dose statin therapy with lipid profile performed 12/14/17 revealing a LDL of 85 and HDL of 37  Hypertension History of essential hypertension with blood pressure today 133/84. He is on carvedilol. Continue current meds at current dosing.  Arteriosclerotic cardiovascular disease (ASCVD) History of CAD status post inferior myocardial infarction in 2006 treated  with urgent stenting by myself of his RCA. He had repeat angiography in 2007 revealing in-stent restenosis and was re-intervened on at that time. He was admitted 02/24/10 with a non-STEMI and underwent cardiac catheterization by Dr. Elisabeth Cara revealing a 70% "in-stent restenosis as well as a 99% proximal RCA lesion both of which were intervened on by myself.He denies chest pain or shortness of breath.  Tobacco abuse Ongoing tobacco abuse of one to one half packs a day recalcitrant risk factor modification.      Lorretta Harp MD FACP,FACC,FAHA, [facility] 01/15/2018 3:53 PM

## 2018-01-17 ENCOUNTER — Telehealth (HOSPITAL_COMMUNITY): Payer: Self-pay

## 2018-01-17 NOTE — Telephone Encounter (Signed)
Encounter complete. 

## 2018-01-18 ENCOUNTER — Ambulatory Visit: Payer: Managed Care, Other (non HMO) | Admitting: Cardiovascular Disease

## 2018-01-22 ENCOUNTER — Ambulatory Visit (HOSPITAL_BASED_OUTPATIENT_CLINIC_OR_DEPARTMENT_OTHER): Payer: Managed Care, Other (non HMO)

## 2018-01-22 ENCOUNTER — Other Ambulatory Visit: Payer: Self-pay

## 2018-01-22 ENCOUNTER — Ambulatory Visit (HOSPITAL_COMMUNITY)
Admission: RE | Admit: 2018-01-22 | Discharge: 2018-01-22 | Disposition: A | Discharge status: Home / Self Care | Payer: Managed Care, Other (non HMO) | Source: Ambulatory Visit | Attending: Cardiology | Admitting: Cardiology

## 2018-01-22 DIAGNOSIS — Z6841 Body Mass Index (BMI) 40.0 and over, adult: Secondary | ICD-10-CM | POA: Diagnosis not present

## 2018-01-22 DIAGNOSIS — I2510 Atherosclerotic heart disease of native coronary artery without angina pectoris: Secondary | ICD-10-CM | POA: Diagnosis not present

## 2018-01-22 DIAGNOSIS — I10 Essential (primary) hypertension: Secondary | ICD-10-CM | POA: Insufficient documentation

## 2018-01-22 DIAGNOSIS — R600 Localized edema: Secondary | ICD-10-CM | POA: Insufficient documentation

## 2018-01-22 DIAGNOSIS — E785 Hyperlipidemia, unspecified: Secondary | ICD-10-CM | POA: Diagnosis not present

## 2018-01-22 DIAGNOSIS — Z8249 Family history of ischemic heart disease and other diseases of the circulatory system: Secondary | ICD-10-CM | POA: Diagnosis not present

## 2018-01-22 DIAGNOSIS — F17200 Nicotine dependence, unspecified, uncomplicated: Secondary | ICD-10-CM | POA: Diagnosis not present

## 2018-01-22 DIAGNOSIS — I351 Nonrheumatic aortic (valve) insufficiency: Secondary | ICD-10-CM | POA: Diagnosis not present

## 2018-01-22 DIAGNOSIS — I252 Old myocardial infarction: Secondary | ICD-10-CM | POA: Insufficient documentation

## 2018-01-23 LAB — EXERCISE TOLERANCE TEST
CHL RATE OF PERCEIVED EXERTION: 19
CSEPED: 4 min
CSEPEDS: 5 s
Estimated workload: 5.9 METS
MPHR: 171 {beats}/min
Peak HR: 126 {beats}/min
Percent HR: 73 %
Rest HR: 71 {beats}/min

## 2018-01-25 ENCOUNTER — Telehealth: Payer: Self-pay | Admitting: Cardiovascular Disease

## 2018-01-25 NOTE — Telephone Encounter (Signed)
New message   Patient spouse calling for test results. Please call

## 2018-01-25 NOTE — Telephone Encounter (Signed)
Notes recorded by Lorretta Harp, MD on 01/23/2018 at 1:30 PM EDT Normal LV systolic function, mild LVH, grade 1 diastolic dysfunction.  Notes recorded by Lorretta Harp, MD on 01/23/2018 at 12:23 PM EDT Essentially normal study. Repeat when clinically indicated.  Wife (DPR) notified of results

## 2018-01-25 NOTE — Telephone Encounter (Signed)
Returned call to patient's wife who states she was calling our office back while we were calling her back about the echo & GXT results. No additional info needed.

## 2018-01-25 NOTE — Telephone Encounter (Signed)
New Message:    Pt returning a call for lab results.

## 2018-01-30 ENCOUNTER — Telehealth: Payer: Self-pay | Admitting: Cardiovascular Disease

## 2018-01-30 NOTE — Telephone Encounter (Signed)
Returned the call to the patient's wife, per the dpr. She stated that they have been married for 9 months now and that the patient would like to know if Dr. Gwenlyn Found could prescribe a medication for erectile dysfunction. Viagra had worked in the past but gave him a headache. Message has been routed to the provider for his recommendations.

## 2018-01-30 NOTE — Telephone Encounter (Signed)
New message  Pt wife verbalized that she is calling for RN  Pt wife want something called in for erectile dysfunction, now that pt has been told that his heart is good.  Viagra gave him a headache four years ago, everything else worked fine it just gave him a headache.    Pt and his new bride have been married for 9 months now   Wife want something that will be inexpensive

## 2018-01-30 NOTE — Telephone Encounter (Signed)
Those drugs need to be prescribed by his PCP.

## 2018-02-01 NOTE — Telephone Encounter (Signed)
Patient's wife has been made aware of Dr. Kennon Holter recommendations and verbalized her understanding.

## 2018-02-01 NOTE — Telephone Encounter (Signed)
Follow up  Pt is calling to check on her medication request for her husband

## 2018-02-08 ENCOUNTER — Ambulatory Visit: Payer: Managed Care, Other (non HMO) | Admitting: Cardiovascular Disease

## 2018-02-11 ENCOUNTER — Ambulatory Visit (INDEPENDENT_AMBULATORY_CARE_PROVIDER_SITE_OTHER): Payer: Self-pay | Admitting: Family Medicine

## 2018-02-11 ENCOUNTER — Encounter: Payer: Self-pay | Admitting: Family Medicine

## 2018-02-11 VITALS — BP 110/78 | HR 74 | Temp 98.0°F | Ht 70.75 in | Wt 295.4 lb

## 2018-02-11 DIAGNOSIS — K219 Gastro-esophageal reflux disease without esophagitis: Secondary | ICD-10-CM

## 2018-02-11 DIAGNOSIS — Z720 Tobacco use: Secondary | ICD-10-CM

## 2018-02-11 DIAGNOSIS — N529 Male erectile dysfunction, unspecified: Secondary | ICD-10-CM

## 2018-02-11 NOTE — Patient Instructions (Signed)
Try Axid, Pepcid, or Zantac for your reflux and if that does not work then try Prilosec.  You can take these as needed  Gastroesophageal Reflux Disease, Adult Normally, food travels down the esophagus and stays in the stomach to be digested. If a person has gastroesophageal reflux disease (GERD), food and stomach acid move back up into the esophagus. When this happens, the esophagus becomes sore and swollen (inflamed). Over time, GERD can make small holes (ulcers) in the lining of the esophagus. Follow these instructions at home: Diet  Follow a diet as told by your doctor. You may need to avoid foods and drinks such as: ? Coffee and tea (with or without caffeine). ? Drinks that contain alcohol. ? Energy drinks and sports drinks. ? Carbonated drinks or sodas. ? Chocolate and cocoa. ? Peppermint and mint flavorings. ? Garlic and onions. ? Horseradish. ? Spicy and acidic foods, such as peppers, chili powder, curry powder, vinegar, hot sauces, and BBQ sauce. ? Citrus fruit juices and citrus fruits, such as oranges, lemons, and limes. ? Tomato-based foods, such as red sauce, chili, salsa, and pizza with red sauce. ? Fried and fatty foods, such as donuts, french fries, potato chips, and high-fat dressings. ? High-fat meats, such as hot dogs, rib eye steak, sausage, ham, and bacon. ? High-fat dairy items, such as whole milk, butter, and cream cheese.  Eat small meals often. Avoid eating large meals.  Avoid drinking large amounts of liquid with your meals.  Avoid eating meals during the 2-3 hours before bedtime.  Avoid lying down right after you eat.  Do not exercise right after you eat. General instructions  Pay attention to any changes in your symptoms.  Take over-the-counter and prescription medicines only as told by your doctor. Do not take aspirin, ibuprofen, or other NSAIDs unless your doctor says it is okay.  Do not use any tobacco products, including cigarettes, chewing tobacco,  and e-cigarettes. If you need help quitting, ask your doctor.  Wear loose clothes. Do not wear anything tight around your waist.  Raise (elevate) the head of your bed about 6 inches (15 cm).  Try to lower your stress. If you need help doing this, ask your doctor.  If you are overweight, lose an amount of weight that is healthy for you. Ask your doctor about a safe weight loss goal.  Keep all follow-up visits as told by your doctor. This is important. Contact a doctor if:  You have new symptoms.  You lose weight and you do not know why it is happening.  You have trouble swallowing, or it hurts to swallow.  You have wheezing or a cough that keeps happening.  Your symptoms do not get better with treatment.  You have a hoarse voice. Get help right away if:  You have pain in your arms, neck, jaw, teeth, or back.  You feel sweaty, dizzy, or light-headed.  You have chest pain or shortness of breath.  You throw up (vomit) and your throw up looks like blood or coffee grounds.  You pass out (faint).  Your poop (stool) is bloody or black.  You cannot swallow, drink, or eat. This information is not intended to replace advice given to you by your health care provider. Make sure you discuss any questions you have with your health care provider. Document Released: 03/27/2008 Document Revised: 03/16/2016 Document Reviewed: 02/03/2015 Elsevier Interactive Patient Education  [employer].

## 2018-02-11 NOTE — Progress Notes (Signed)
   Subjective:    Patient ID: Brandon [name], male    DOB: May 17, 1971, 47 y.o.   MRN: [id number]  HPI He is here for consult concerning continued difficulty with erectile dysfunction.  He states that Viagra does help but he has some unacceptable side effects from that.  He also has had difficulty recently with a lot of acid indigestion and has been using Tums with fairly good results.  Review of the record also indicates he is taking allopurinol for presumed gout.  There is not good documentation of this in the record but he states that an orthopedic surgeon gave him this medication. He does smoke but does not drink.  He does have underlying heart disease but is not on NTG Review of Systems     Objective:   Physical Exam Alert and in no distress otherwise not examined      Assessment & Plan:  Erectile dysfunction, unspecified erectile dysfunction type  Gastroesophageal reflux disease without esophagitis  Tobacco abuse I discussed the erectile dysfunction with him.  I will give him sildenafil.  Recommend he take up to 5 of the 20 mg tablets and use them on an as-needed basis he will keep me informed how this works. I will also give him information concerning reflux disease.  Recommend he try an H2 blocker and if no success then switched to Prilosec. Also recommend he come back when he gets insurance for complete examination. Did not discuss smoking cessation with him.

## 2018-05-20 ENCOUNTER — Other Ambulatory Visit: Payer: Self-pay | Admitting: Orthopaedic Surgery

## 2018-09-10 ENCOUNTER — Ambulatory Visit: Payer: Self-pay | Admitting: Family Medicine

## 2018-09-10 ENCOUNTER — Encounter: Payer: Self-pay | Admitting: Family Medicine

## 2018-09-10 VITALS — BP 130/80 | HR 68 | Temp 98.3°F | Wt 284.2 lb

## 2018-09-10 DIAGNOSIS — Z720 Tobacco use: Secondary | ICD-10-CM

## 2018-09-10 DIAGNOSIS — R05 Cough: Secondary | ICD-10-CM

## 2018-09-10 DIAGNOSIS — R059 Cough, unspecified: Secondary | ICD-10-CM

## 2018-09-10 DIAGNOSIS — J189 Pneumonia, unspecified organism: Secondary | ICD-10-CM

## 2018-09-10 MED ORDER — ALBUTEROL SULFATE HFA 108 (90 BASE) MCG/ACT IN AERS
2.0000 | INHALATION_SPRAY | Freq: Four times a day (QID) | RESPIRATORY_TRACT | 2 refills | Status: DC | PRN
Start: 2018-09-10 — End: 2019-01-22

## 2018-09-10 MED ORDER — LEVOFLOXACIN 500 MG PO TABS: 500.0000 mg | ORAL_TABLET | Freq: Every day | ORAL | 0 refills | Status: DC

## 2018-09-10 NOTE — Progress Notes (Signed)
   Subjective:    Patient ID: MATT DELPIZZO, male    DOB: 04/27/1971, 48 y.o.   MRN: [id number]  HPI 4 days ago he noted the onset of sneezing followed by cough, myalgias, wheezing with fever and chills.  The symptoms of gotten worse.  He does smoke and is considering quitting.   Review of Systems     Objective:   Physical Exam Alert and in no distress. Tympanic membranes and canals are normal. Pharyngeal area is normal. Neck is supple without adenopathy or thyromegaly. Cardiac exam shows a regular sinus rhythm without murmurs or gallops. Lungs show rhonchi especially on the right mid lung area.        Assessment & Plan:  Pneumonia of right lung due to infectious organism, unspecified part of lung - Plan: levofloxacin (LEVAQUIN) 500 MG tablet, albuterol (PROVENTIL HFA;VENTOLIN HFA) 108 (90 Base) MCG/ACT inhaler  Cough - Plan: levofloxacin (LEVAQUIN) 500 MG tablet, albuterol (PROVENTIL HFA;VENTOLIN HFA) 108 (90 Base) MCG/ACT inhaler  Tobacco abuse Clinically he has pneumonia.  I explained this to him.  He is to use the Levaquin and Proventil.  Also recommend Tylenol and Advil or Aleve.  I then discussed smoking cessation with him.  He plans to quit cold [place] while he is driving.  I also discussed the psychological component of smoking.  He will keep me informed concerning his progress.

## 2018-09-10 NOTE — Patient Instructions (Signed)
For the fever aches and pains you can take 2 Tylenol 4 times per day and/or 2 Aleve twice per day

## 2018-09-12 ENCOUNTER — Encounter (HOSPITAL_COMMUNITY): Payer: Self-pay | Admitting: *Deleted

## 2018-09-12 ENCOUNTER — Emergency Department (HOSPITAL_COMMUNITY): Payer: Self-pay

## 2018-09-12 ENCOUNTER — Emergency Department (HOSPITAL_COMMUNITY)
Admission: EM | Admit: 2018-09-12 | Discharge: 2018-09-12 | Disposition: A | Discharge status: Home / Self Care | Payer: Self-pay | Attending: Emergency Medicine | Admitting: Emergency Medicine

## 2018-09-12 DIAGNOSIS — J40 Bronchitis, not specified as acute or chronic: Secondary | ICD-10-CM | POA: Insufficient documentation

## 2018-09-12 DIAGNOSIS — I10 Essential (primary) hypertension: Secondary | ICD-10-CM | POA: Insufficient documentation

## 2018-09-12 DIAGNOSIS — Z7982 Long term (current) use of aspirin: Secondary | ICD-10-CM | POA: Insufficient documentation

## 2018-09-12 DIAGNOSIS — J9801 Acute bronchospasm: Secondary | ICD-10-CM | POA: Insufficient documentation

## 2018-09-12 DIAGNOSIS — F17210 Nicotine dependence, cigarettes, uncomplicated: Secondary | ICD-10-CM | POA: Insufficient documentation

## 2018-09-12 DIAGNOSIS — J209 Acute bronchitis, unspecified: Secondary | ICD-10-CM

## 2018-09-12 DIAGNOSIS — Z79899 Other long term (current) drug therapy: Secondary | ICD-10-CM | POA: Insufficient documentation

## 2018-09-12 LAB — CBC WITH DIFFERENTIAL/PLATELET
Abs Immature Granulocytes: 0.07 10*3/uL (ref 0.00–0.07)
BASOS ABS: 0.1 10*3/uL (ref 0.0–0.1)
Basophils Relative: 1 %
EOS ABS: 0.3 10*3/uL (ref 0.0–0.5)
EOS PCT: 3 %
HEMATOCRIT: 51.2 % (ref 39.0–52.0)
Hemoglobin: 17.2 g/dL — ABNORMAL HIGH (ref 13.0–17.0)
Immature Granulocytes: 1 %
LYMPHS ABS: 3.5 10*3/uL (ref 0.7–4.0)
LYMPHS PCT: 39 %
MCH: 31.2 pg (ref 26.0–34.0)
MCHC: 33.6 g/dL (ref 30.0–36.0)
MCV: 92.9 fL (ref 80.0–100.0)
MONO ABS: 0.5 10*3/uL (ref 0.1–1.0)
Monocytes Relative: 5 %
Neutro Abs: 4.6 10*3/uL (ref 1.7–7.7)
Neutrophils Relative %: 51 %
Platelets: 182 10*3/uL (ref 150–400)
RBC: 5.51 MIL/uL (ref 4.22–5.81)
RDW: 13.4 % (ref 11.5–15.5)
WBC: 9 10*3/uL (ref 4.0–10.5)
nRBC: 0 % (ref 0.0–0.2)

## 2018-09-12 LAB — BASIC METABOLIC PANEL
ANION GAP: 7 (ref 5–15)
BUN: 11 mg/dL (ref 6–20)
CALCIUM: 9.2 mg/dL (ref 8.9–10.3)
CO2: 23 mmol/L (ref 22–32)
Chloride: 107 mmol/L (ref 98–111)
Creatinine, Ser: 0.99 mg/dL (ref 0.61–1.24)
Glucose, Bld: 97 mg/dL (ref 70–99)
Potassium: 4.2 mmol/L (ref 3.5–5.1)
Sodium: 137 mmol/L (ref 135–145)

## 2018-09-12 LAB — TROPONIN I: Troponin I: <0.03 ng/mL (ref <0.03)

## 2018-09-12 MED ORDER — ALBUTEROL (5 MG/ML) CONTINUOUS INHALATION SOLN
10.0000 mg/h | INHALATION_SOLUTION | RESPIRATORY_TRACT | Status: DC
  Administered 2018-09-12: 10 mg/h via RESPIRATORY_TRACT
  Filled 2018-09-12: qty 20

## 2018-09-12 MED ORDER — METHYLPREDNISOLONE SODIUM SUCC 125 MG IJ SOLR
125.0000 mg | Freq: Once | INTRAMUSCULAR | Status: AC
  Administered 2018-09-12: 125 mg via INTRAMUSCULAR
  Filled 2018-09-12: qty 2

## 2018-09-12 MED ORDER — PREDNISONE 20 MG PO TABS: ORAL_TABLET | ORAL | 0 refills | Status: DC

## 2018-09-12 MED ORDER — ALBUTEROL SULFATE (2.5 MG/3ML) 0.083% IN NEBU
5.0000 mg | INHALATION_SOLUTION | Freq: Once | RESPIRATORY_TRACT | Status: AC
  Administered 2018-09-12: 5 mg via RESPIRATORY_TRACT
  Filled 2018-09-12: qty 6

## 2018-09-12 NOTE — ED Triage Notes (Signed)
States he was treated by his family doctor for pneumonia 2 days ago and is not getting better. Started on Levaquin

## 2018-09-12 NOTE — ED Notes (Addendum)
Pt was diagnosed with pneumonia 2 days ago and started on Levaquin which he has been taking as directed. Pt c/o strong, non-productive cough, right sided rib cage pain with coughing, generalized achy feeling. Pt was started on inhaler and has been using it without much relief. Pt has inspiratory and expiratory wheezing. O2 sat 95% on RA, respiratory rate 24 breaths per minute.

## 2018-09-12 NOTE — Discharge Instructions (Signed)
Continue antibiotic as prescribed.  Use albuterol inhaler as needed for wheezing.  Follow-up with your primary physician early next week if your symptoms are not improving.

## 2018-09-12 NOTE — ED Provider Notes (Signed)
[facility] EMERGENCY DEPARTMENT Provider Note   CSN: [id number] Arrival date & time: 09/12/18  1130     History   Chief Complaint Chief Complaint  Patient presents with  . Cough    HPI Brandon [name] is a 47 y.o. male.  HPI Patient presents with 5 days of cough, subjective fevers and chills, right-sided "rib pain" with coughing, diffuse myalgias.  Was seen by his primary physician 2 days ago and diagnosed with pneumonia.  Started on Levaquin.  States he has had 5 doses of 500 mg of Levaquin with little improvement of his symptoms. Past Medical History:  Diagnosis Date  . Arteriosclerotic cardiovascular disease (ASCVD) 2006, 2012   2006-acute IMI treated with urgent RCA stent; 2007-[place] for in-stent restenosis; 02/2010-presented with ACS and minimal troponin elevation:70% LAD, 80% distal circumflex, 80% proximal ramus branch vessel,in-stent restenosis of 70% in the RCA; BMS for proximal critical RCA stenosis, restenosis Nov 2012  . Heart attack ([place])   . History of noncompliance with medical treatment    Due to financial considerations  . Hyperlipidemia   . Hypertension   . Tobacco abuse    40 pack years    Patient Active Problem List   Diagnosis Date Noted  . 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 Sleep apnea 12/29/2012  . Arteriosclerotic cardiovascular disease (ASCVD)   . Tobacco abuse   . History of noncompliance with medical treatment   . Hyperlipidemia 05/30/2011  . Hypertension 05/30/2011  . Obesity 05/30/2011    Past Surgical History:  Procedure Laterality Date  . CORONARY ANGIOPLASTY WITH STENT PLACEMENT          Home Medications    Prior to Admission medications   Medication Sig Start Date End Date Taking? Authorizing Provider  albuterol (PROVENTIL HFA;VENTOLIN HFA) 108 (90 Base) MCG/ACT inhaler Inhale 2 puffs into the lungs every 6 (six) hours as needed for wheezing or shortness of breath. 09/10/18  Yes Denita Lung, MD    aspirin EC 81 MG tablet Take 81 mg by mouth daily.   Yes [provider]  atorvastatin (LIPITOR) 80 MG tablet Take 1 tablet (80 mg total) by mouth daily. 06/19/17  Yes Denita Lung, MD  carvedilol (COREG) 25 MG tablet Take 1 tablet (25 mg total) by mouth 2 (two) times daily with a meal. 06/19/17  Yes Denita Lung, MD  levofloxacin (LEVAQUIN) 500 MG tablet Take 1 tablet (500 mg total) by mouth daily. 09/10/18  Yes Denita Lung, MD  naproxen (NAPROSYN) 500 MG tablet TAKE 1 TABLET BY MOUTH TWICE DAILY WITH A MEAL Patient not taking: Reported on 09/12/2018 05/21/18   Sanjuana Kava, MD  predniSONE (DELTASONE) 20 MG tablet 3 tabs po day one, then 2 po daily x 4 days 09/13/18   Julianne Rice, MD    Family History Family History  Problem Relation Age of Onset  . Depression Mother 43       Suicide  . Heart disease Father 79       Deceased from massive heart-attack  . Hyperlipidemia Father   . Hypertension Father   . COPD Maternal Grandfather   . Cancer Paternal Grandmother        Male Cancer  . Heart disease Paternal Grandmother     Social History Social History   Tobacco Use  . Smoking status: Current Every Day Smoker    Packs/day: 1.00    Years: 27.00    Pack years: 27.00  Types: Cigarettes  . Smokeless tobacco: Never Used  Substance Use Topics  . Alcohol use: No    Comment:  Alcoholism- quit 2002  . Drug use: No     Allergies   Patient has no known allergies.   Review of Systems Review of Systems  Constitutional: Positive for chills, fatigue and fever.  HENT: Negative for trouble swallowing.   Respiratory: Positive for cough, shortness of breath and wheezing.   Cardiovascular: Positive for chest pain. Negative for palpitations and leg swelling.  Gastrointestinal: Negative for abdominal pain, constipation, diarrhea, nausea and vomiting.  Musculoskeletal: Negative for back pain, myalgias, neck pain and neck stiffness.  Skin: Negative for rash and  wound.  Neurological: Negative for dizziness, weakness, light-headedness, numbness and headaches.  All other systems reviewed and are negative.    Physical Exam Updated Vital Signs BP 102/77   Pulse (!) 105   Temp 98 F (36.7 C) (Oral)   Resp (!) 25   Ht 5\' 9"  (1.753 m)   Wt 129.3 kg   SpO2 96%   BMI 42.09 kg/m   Physical Exam  Constitutional: He is oriented to person, place, and time. He appears well-developed and well-nourished. No distress.  HENT:  Head: Normocephalic and atraumatic.  Mouth/Throat: Oropharynx is clear and moist.  Eyes: Pupils are equal, round, and reactive to light. EOM are normal.  Neck: Normal range of motion. Neck supple.  Cardiovascular: Normal rate and regular rhythm.  Pulmonary/Chest: Effort normal. He has wheezes. He has rales.  Diffuse expiratory wheezes.  Patient does have some rhonchi in bilateral bases  Abdominal: Soft. Bowel sounds are normal. There is no tenderness. There is no rebound and no guarding.  Musculoskeletal: Normal range of motion. He exhibits no edema or tenderness.  No lower extremity swelling, asymmetry or tenderness.  Neurological: He is alert and oriented to person, place, and time.  Moving all extremities without focal deficit.  Sensation intact.  Skin: Skin is warm and dry. Capillary refill takes less than 2 seconds. No rash noted. No erythema.  Psychiatric: He has a normal mood and affect. His behavior is normal.  Nursing note and vitals reviewed.    ED Treatments / Results  Labs (all labs ordered are listed, but only abnormal results are displayed) Labs Reviewed  CBC WITH DIFFERENTIAL/PLATELET - Abnormal; Notable for the following components:      Result Value   Hemoglobin 17.2 (*)    All other components within normal limits  BASIC METABOLIC PANEL  TROPONIN I    EKG EKG Interpretation  Date/Time:  Thursday September 12 2018 13:07:08 EST Ventricular Rate:  85 PR Interval:    QRS Duration: 91 QT  Interval:  384 QTC Calculation: 457 R Axis:   -32 Text Interpretation:  Sinus rhythm Inferior infarct, old Confirmed by Julianne Rice [phone]) on 09/12/2018 3:28:24 PM   Radiology Dg Chest 2 View  Result Date: 09/12/2018 CLINICAL DATA:  Cough EXAM: CHEST - 2 VIEW COMPARISON:  12/19/2017 FINDINGS: The heart size and mediastinal contours are within normal limits. Both lungs are clear. The visualized skeletal structures are unremarkable. IMPRESSION: No active cardiopulmonary disease. Electronically Signed   By: Franchot Gallo M.D.   On: 09/12/2018 12:56    Procedures Procedures (including critical care time)  Medications Ordered in ED Medications  albuterol (PROVENTIL,VENTOLIN) solution continuous neb (10 mg/hr Nebulization New Bag/Given 09/12/18 1409)  albuterol (PROVENTIL) (2.5 MG/3ML) 0.083% nebulizer solution 5 mg (5 mg Nebulization Given 09/12/18 1311)  methylPREDNISolone sodium succinate (SOLU-MEDROL)  125 mg/2 mL injection 125 mg (125 mg Intramuscular Given 09/12/18 1301)     Initial Impression / Assessment and Plan / ED Course  I have reviewed the triage vital signs and the nursing notes.  Pertinent labs & imaging results that were available during my care of the patient were reviewed by me and considered in my medical decision making (see chart for details).    Patient with 2 nebulized treatment.  Much better air movement.  Patient states he is feeling much better.  X-ray without acute findings.  Suspect bronchitis with bronchospasm.  Will give short course of steroids.  Encouraged to continue antibiotics as previously prescribed.  Follow-up with primary physician if symptoms are not improving early next week.  Strict return precautions given.   Final Clinical Impressions(s) / ED Diagnoses   Final diagnoses:  Bronchitis with bronchospasm    ED Discharge Orders         Ordered    predniSONE (DELTASONE) 20 MG tablet     09/12/18 1527           Julianne Rice,  MD 09/12/18 1528

## 2018-09-16 ENCOUNTER — Ambulatory Visit (INDEPENDENT_AMBULATORY_CARE_PROVIDER_SITE_OTHER): Payer: Self-pay | Admitting: Family Medicine

## 2018-09-16 ENCOUNTER — Encounter: Payer: Self-pay | Admitting: Family Medicine

## 2018-09-16 ENCOUNTER — Ambulatory Visit: Payer: Self-pay | Admitting: Family Medicine

## 2018-09-16 VITALS — BP 128/82 | HR 80 | Temp 97.7°F | Wt 288.4 lb

## 2018-09-16 DIAGNOSIS — J189 Pneumonia, unspecified organism: Secondary | ICD-10-CM

## 2018-09-16 MED ORDER — FLUTICASONE FUROATE-VILANTEROL 200-25 MCG/INH IN AEPB: 1.0000 | INHALATION_SPRAY | Freq: Every day | RESPIRATORY_TRACT | 0 refills | Status: DC

## 2018-09-16 NOTE — Progress Notes (Signed)
   Subjective:    Patient ID: Brandon [name], male    DOB: 01/26/1971, 47 y.o.   MRN: [id number]  HPI He is here for recheck.  He was seen in the ED on the 21st.  At that time did not show anything.  He is continued on his inhaler but using it every couple of hours.  He states he is feeling better but coughing is a main issue.   Review of Systems     Objective:   Physical Exam Alert and in no distress.  Not tachypnea.  Lungs again show rhonchi mainly on the right.  Back exam is normal.       Assessment & Plan:  Pneumonia of right lung due to infectious organism, unspecified part of lung - Plan: fluticasone furoate-vilanterol (BREO ELLIPTA) 200-25 MCG/INH AEPB He is to continue on his inhaler and I explained that hopefully he will need less and less of the albuterol.  I will give him a note out of work until Wednesday

## 2018-10-31 ENCOUNTER — Telehealth: Payer: Self-pay | Admitting: Family Medicine

## 2018-10-31 NOTE — Telephone Encounter (Signed)
Recv'd form from [place] consent for pt to return to work.  Signed and faxed back to t# [phone]

## 2018-11-26 ENCOUNTER — Ambulatory Visit (INDEPENDENT_AMBULATORY_CARE_PROVIDER_SITE_OTHER): Payer: Self-pay | Admitting: Family Medicine

## 2018-11-26 ENCOUNTER — Encounter: Payer: Self-pay | Admitting: Family Medicine

## 2018-11-26 VITALS — BP 126/84 | HR 82 | Temp 98.1°F | Wt 288.6 lb

## 2018-11-26 DIAGNOSIS — A630 Anogenital (venereal) warts: Secondary | ICD-10-CM

## 2018-11-26 NOTE — Progress Notes (Signed)
   Subjective:    Patient ID: Brandon [name], male    DOB: [birth year], 48 y.o.   MRN: [id number]  HPI He is here for evaluation of lesions on his penis.  He also has multiple skin tags in his axillary areas.  He also notes some in the inguinal area as well.   Review of Systems     Objective:   Physical Exam Alert and in no distress.  Exam of the penis shows multiple warts present that are pigmented in nature.      Assessment & Plan:  Penile venereal warts - Plan: Ambulatory referral to Dermatology I explained that there were too many to be handled here and refer to dermatology for more extensive evaluation and treatment.  Explained that the skin tags unless they cause difficulty or not worth treating.

## 2019-01-22 ENCOUNTER — Encounter: Payer: Self-pay | Admitting: Medical

## 2019-01-22 ENCOUNTER — Other Ambulatory Visit: Payer: Self-pay

## 2019-01-22 ENCOUNTER — Ambulatory Visit (INDEPENDENT_AMBULATORY_CARE_PROVIDER_SITE_OTHER): Payer: BLUE CROSS/BLUE SHIELD | Admitting: Medical

## 2019-01-22 VITALS — Ht 69.0 in | Wt 288.0 lb

## 2019-01-22 DIAGNOSIS — Z720 Tobacco use: Secondary | ICD-10-CM | POA: Diagnosis not present

## 2019-01-22 DIAGNOSIS — R319 Hematuria, unspecified: Secondary | ICD-10-CM | POA: Insufficient documentation

## 2019-01-22 DIAGNOSIS — R1032 Left lower quadrant pain: Secondary | ICD-10-CM | POA: Diagnosis not present

## 2019-01-22 DIAGNOSIS — Z87442 Personal history of urinary calculi: Secondary | ICD-10-CM

## 2019-01-22 NOTE — Progress Notes (Signed)
Subjective:     Patient ID: Brandon [name], male   DOB: 11-23-1970, 48 y.o.   MRN: [id number]  Interactive audio and video telecommunications were attempted between this provider and patient, however they did not have access to video capability.  We continued and completed visit with audio only.  The patient was located at home. The provider was located in the office. The patient did consent to this visit and is aware of possible charges through their insurance for this visit.  The other persons participating in this telemedicine service were none. Time spent on call was 18 minutes and in review of previous records >25 minutes total.  This virtual service is not related to other E/M service within previous 7 days.   HPI  Chief Complaint  Patient presents with  . uti    lower stomach pain, left side, blood in urine.  no burning X 1 month   Virtual visit today for abdominal pain and blood in the urine  Last month had some intermittent pain in left side, abdomen.  Th the pain did not feel like kidney stones he has had in the past.   There is a pain when he urinates, that was intermittent. However, in last 2 days, it has been consistent pain with urination.  Also has a general left lower abdominal pain.. Pain starts lower left abdomen to mid abdomen.  Pain lasts for hours at times.   Drinks a lot water or tries to have BM to make it go away.   Drinks a lot of mountain dew and other soda.   No nausea, no diarrhea, no constipation.   No blood in stool.  Is having some blood in the urine.  Has had this a few times in the toilet this past month.  Does have history of kidney stones.  No fever.  No back pain.   No injury, no fall.  Is a heavy smoker, has COPD, but no new illness symptoms of URI symptoms.   No recent exposure to coronavirus.   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  Married, no concern for STD. No prostate infection.  No hx/o diverticulitis.  No prior  colonoscopy.    Past Medical History:  Diagnosis Date  . Arteriosclerotic cardiovascular disease (ASCVD) 2006, 2012   2006-acute IMI treated with urgent RCA stent; 2007-Cutting Balloon for in-stent restenosis; 02/2010-presented with ACS and minimal troponin elevation:70% LAD, 80% distal circumflex, 80% proximal ramus branch vessel,in-stent restenosis of 70% in the RCA; BMS for proximal critical RCA stenosis, restenosis Nov 2012  . Heart attack ([place])   . History of noncompliance with medical treatment    Due to financial considerations  . Hyperlipidemia   . Hypertension   . Tobacco abuse    40 pack years   Current Outpatient Medications on File Prior to Visit  Medication Sig Dispense Refill  . aspirin EC 81 MG tablet Take 81 mg by mouth daily.    Marland Kitchen atorvastatin (LIPITOR) 80 MG tablet Take 1 tablet (80 mg total) by mouth daily. 90 tablet 3  . carvedilol (COREG) 25 MG tablet Take 1 tablet (25 mg total) by mouth 2 (two) times daily with a meal. 180 tablet 3   Current Facility-Administered Medications on File Prior to Visit  Medication Dose Route Frequency Provider Last Rate Last Dose  . Influenza (>/= 3 years) inactive virus vaccine (FLVIRIN/FLUZONE) injection SUSP 0.5 mL  0.5 mL Intramuscular Once Fayrene Helper,  MD        Review of Systems As in subjective    Objective:   Physical Exam Ht 5\' 9"  (1.753 m)   Wt 288 lb (130.6 kg)   BMI 42.53 kg/m  Wt Readings from Last 3 Encounters:  01/22/19 288 lb (130.6 kg)  11/26/18 288 lb 9.6 oz (130.9 kg)  09/16/18 288 lb 6.4 oz (130.8 kg)   Due to coronavirus pandemic stay at home measures, patient visit was virtual and they were not examined in person.       Assessment:     Encounter Diagnoses  Name Primary?  . Hematuria, unspecified type Yes  . Left lower quadrant abdominal pain   . Tobacco abuse   . History of renal calculi         Plan:     We discussed his symptoms and concerns.  We discussed the limitations of a  tele-visit unable to examine him fully.  I asked him to go ahead and schedule a time to come in the next few days as he would need abdominal exam, prostate exam, urinalysis, and likely CT abdomen pelvis and labs (PSA, CBC, BMET)  Differential includes kidney stone, urinary tract infection, prostate infection, tumor, or other genitourinary abnormality  He is a heavy smoker so he has elevated risk for GU cancer  Of note he has never seen urology or had a colonoscopy  He is out of town right now we will call back today or tomorrow to get appointment to come in for exam

## 2019-01-23 ENCOUNTER — Encounter: Payer: Self-pay | Admitting: Family Medicine

## 2019-01-23 ENCOUNTER — Encounter: Payer: Self-pay | Admitting: Medical

## 2019-01-23 ENCOUNTER — Ambulatory Visit: Payer: BLUE CROSS/BLUE SHIELD | Admitting: Medical

## 2019-01-23 VITALS — BP 140/90 | HR 77 | Temp 98.4°F | Resp 16 | Ht 69.0 in | Wt 273.2 lb

## 2019-01-23 DIAGNOSIS — R1084 Generalized abdominal pain: Secondary | ICD-10-CM

## 2019-01-23 DIAGNOSIS — Z720 Tobacco use: Secondary | ICD-10-CM

## 2019-01-23 DIAGNOSIS — Z125 Encounter for screening for malignant neoplasm of prostate: Secondary | ICD-10-CM

## 2019-01-23 DIAGNOSIS — A630 Anogenital (venereal) warts: Secondary | ICD-10-CM

## 2019-01-23 DIAGNOSIS — Z87442 Personal history of urinary calculi: Secondary | ICD-10-CM

## 2019-01-23 DIAGNOSIS — Z7902 Long term (current) use of antithrombotics/antiplatelets: Secondary | ICD-10-CM | POA: Insufficient documentation

## 2019-01-23 DIAGNOSIS — R109 Unspecified abdominal pain: Secondary | ICD-10-CM | POA: Diagnosis not present

## 2019-01-23 DIAGNOSIS — R319 Hematuria, unspecified: Secondary | ICD-10-CM

## 2019-01-23 DIAGNOSIS — Z1211 Encounter for screening for malignant neoplasm of colon: Secondary | ICD-10-CM

## 2019-01-23 DIAGNOSIS — K629 Disease of anus and rectum, unspecified: Secondary | ICD-10-CM

## 2019-01-23 LAB — POCT URINALYSIS DIP (PROADVANTAGE DEVICE)
Bilirubin, UA: NEGATIVE
Glucose, UA: NEGATIVE mg/dL
Ketones, POC UA: NEGATIVE mg/dL
Leukocytes, UA: NEGATIVE
Nitrite, UA: NEGATIVE
Protein Ur, POC: NEGATIVE mg/dL
Specific Gravity, Urine: 1.025
Urobilinogen, Ur: NEGATIVE
pH, UA: 5.5 (ref 5.0–8.0)

## 2019-01-23 NOTE — Progress Notes (Signed)
Subjective:     Patient ID: Brandon [name], male   DOB: [birth year], 48 y.o.   MRN: [id number]  HPI  Chief Complaint  Patient presents with  . blood in urine    blood in urine X1 month   Here for hematuria and symptoms discussed on tele visit yesterday.   Last month had some intermittent pain in left side, abdomen.  Th the pain did not feel like kidney stones he has had in the past.   There is a pain when he urinates, that was intermittent. However, in last 2 days, it has been consistent pain with urination.  Also has a general left lower abdominal pain.. Pain starts lower left abdomen to mid abdomen.  Pain lasts for hours at times.   Drinks a lot water or tries to have BM to make it go away.   Drinks a lot of mountain dew and other soda.   No nausea, no diarrhea, no constipation.   No blood in stool.  Is having some blood in the urine.  Has had this a few times in the toilet this past month.  Does have history of kidney stones.  No fever.  No back pain.   No injury, no fall.  Is a heavy smoker, has COPD, but no new illness symptoms of URI symptoms.   No recent exposure to coronavirus.   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  Married, no concern for STD. No prostate infection.  No hx/o diverticulitis.  No prior colonoscopy.    Past Medical History:  Diagnosis Date  . Arteriosclerotic cardiovascular disease (ASCVD) 2006, 2012   2006-acute IMI treated with urgent RCA stent; 2007-Cutting Balloon for in-stent restenosis; 02/2010-presented with ACS and minimal troponin elevation:70% LAD, 80% distal circumflex, 80% proximal ramus branch vessel,in-stent restenosis of 70% in the RCA; BMS for proximal critical RCA stenosis, restenosis Nov 2012  . Heart attack ([place])   . History of noncompliance with medical treatment    Due to financial considerations  . Hyperlipidemia   . Hypertension   . Tobacco abuse    40 pack years   Current Outpatient Medications on File Prior  to Visit  Medication Sig Dispense Refill  . aspirin EC 81 MG tablet Take 81 mg by mouth daily.    Marland Kitchen atorvastatin (LIPITOR) 80 MG tablet Take 1 tablet (80 mg total) by mouth daily. 90 tablet 3  . carvedilol (COREG) 25 MG tablet Take 1 tablet (25 mg total) by mouth 2 (two) times daily with a meal. 180 tablet 3   Current Facility-Administered Medications on File Prior to Visit  Medication Dose Route Frequency Provider Last Rate Last Dose  . Influenza (>/= 3 years) inactive virus vaccine (FLVIRIN/FLUZONE) injection SUSP 0.5 mL  0.5 mL Intramuscular Once Fayrene Helper, MD        Review of Systems As in subjective    Objective:   Physical Exam BP 140/90   Pulse 77   Temp 98.4 F (36.9 C) (Oral)   Resp 16   Ht 5\' 9"  (1.753 m)   Wt 273 lb 3.2 oz (123.9 kg)   SpO2 95%   BMI 40.34 kg/m    Wt Readings from Last 3 Encounters:  01/23/19 273 lb 3.2 oz (123.9 kg)  01/22/19 288 lb (130.6 kg)  11/26/18 288 lb 9.6 oz (130.9 kg)   General appearance: alert, no distress, WD/WN,  Oral cavity: MMM, no lesions Neck: supple, no lymphadenopathy,  no thyromegaly, no masses Heart: RRR, normal S1, S2, no murmurs Lungs: CTA bilaterally, no wheezes, rhonchi, or rales Abdomen: +bs, soft, non tender, non distended, no masses, no hepatomegaly, no splenomegaly Back:no CVA tenderness GU: normal male, circumcised, few dorsal penile warts, no testicular tenderness, no mass, no lymphadenopathy Rectal: thickened tissue of anterior anal verge, unusual, otherwise normal tone, prostate WNL Pulses: 2+ symmetric, upper and lower extremities, normal cap refill      Assessment:     Encounter Diagnoses  Name Primary?  . Hematuria, unspecified type Yes  . History of renal calculi   . Tobacco abuse   . Abdominal pain, unspecified abdominal location   . Generalized abdominal pain   . Screening for prostate cancer   . Genital warts   . Rectal abnormality   . Screen for colon cancer         Plan:      Many RBCs on urine microscopic, no other microscopic abnormality  Labs today Will ultimately need to see GI for colonoscopy and rectal exam given abnormality on exam He already had dermatology appt scheduled for penile warts Discussed possible causes of hematuria, including kidney stone, urinary tract infection, prostate infection, tumor, or other genitourinary abnormality  He is a heavy smoker so he has elevated risk for GU cancer  Of note he has never seen urology or had a colonoscopy  Consider CT abd/pelvis vs Urology   Brandon [name] was seen today for blood in urine.  Diagnoses and all orders for this visit:  Hematuria, unspecified type -     POCT Urinalysis DIP (Proadvantage Device) -     Comprehensive metabolic panel -     CBC with Differential/Platelet -     Cancel: PSA -     Urine Culture  History of renal calculi  Tobacco abuse  Abdominal pain, unspecified abdominal location -     Comprehensive metabolic panel -     CBC with Differential/Platelet  Generalized abdominal pain  Screening for prostate cancer -     Cancel: PSA  Genital warts  Rectal abnormality -     Ambulatory referral to Gastroenterology  Screen for colon cancer -     Ambulatory referral to Gastroenterology

## 2019-01-23 NOTE — Progress Notes (Signed)
We will only be able to charge for the office visit for today

## 2019-01-24 ENCOUNTER — Other Ambulatory Visit: Payer: Self-pay | Admitting: Medical

## 2019-01-24 DIAGNOSIS — Z1211 Encounter for screening for malignant neoplasm of colon: Secondary | ICD-10-CM

## 2019-01-24 DIAGNOSIS — K629 Disease of anus and rectum, unspecified: Secondary | ICD-10-CM

## 2019-01-24 LAB — CBC WITH DIFFERENTIAL/PLATELET
Basophils Absolute: 0.1 10*3/uL (ref 0.0–0.2)
Basos: 2 %
EOS (ABSOLUTE): 0.3 10*3/uL (ref 0.0–0.4)
Eos: 3 %
Hematocrit: 49.9 % (ref 37.5–51.0)
Hemoglobin: 16.8 g/dL (ref 13.0–17.7)
Immature Grans (Abs): 0.1 10*3/uL (ref 0.0–0.1)
Immature Granulocytes: 1 %
Lymphocytes Absolute: 3 10*3/uL (ref 0.7–3.1)
Lymphs: 39 %
MCH: 31.2 pg (ref 26.6–33.0)
MCHC: 33.7 g/dL (ref 31.5–35.7)
MCV: 93 fL (ref 79–97)
Monocytes Absolute: 0.4 10*3/uL (ref 0.1–0.9)
Monocytes: 6 %
Neutrophils Absolute: 3.8 10*3/uL (ref 1.4–7.0)
Neutrophils: 49 %
Platelets: 189 10*3/uL (ref 150–450)
RBC: 5.39 x10E6/uL (ref 4.14–5.80)
RDW: 13 % (ref 11.6–15.4)
WBC: 7.6 10*3/uL (ref 3.4–10.8)

## 2019-01-24 LAB — COMPREHENSIVE METABOLIC PANEL
ALT: 36 IU/L (ref 0–44)
AST: 24 IU/L (ref 0–40)
Albumin/Globulin Ratio: 1.7 (ref 1.2–2.2)
Albumin: 4.3 g/dL (ref 4.0–5.0)
Alkaline Phosphatase: 89 IU/L (ref 39–117)
BUN/Creatinine Ratio: 11 (ref 9–20)
BUN: 11 mg/dL (ref 6–24)
Bilirubin Total: 0.5 mg/dL (ref 0.0–1.2)
CO2: 24 mmol/L (ref 20–29)
Calcium: 9.4 mg/dL (ref 8.7–10.2)
Chloride: 104 mmol/L (ref 96–106)
Creatinine, Ser: 0.98 mg/dL (ref 0.76–1.27)
GFR calc Af Amer: 103 mL/min/{1.73_m2} (ref >59)
GFR calc non Af Amer: 90 mL/min/{1.73_m2} (ref >59)
Globulin, Total: 2.6 g/dL (ref 1.5–4.5)
Glucose: 117 mg/dL — ABNORMAL HIGH (ref 65–99)
Potassium: 4.4 mmol/L (ref 3.5–5.2)
Sodium: 143 mmol/L (ref 134–144)
Total Protein: 6.9 g/dL (ref 6.0–8.5)

## 2019-01-24 LAB — URINE CULTURE: Organism ID, Bacteria: NO GROWTH

## 2019-01-26 ENCOUNTER — Other Ambulatory Visit: Payer: Self-pay | Admitting: Medical

## 2019-01-26 DIAGNOSIS — R1084 Generalized abdominal pain: Secondary | ICD-10-CM

## 2019-01-28 ENCOUNTER — Telehealth: Payer: Self-pay

## 2019-01-28 ENCOUNTER — Other Ambulatory Visit: Payer: Self-pay | Admitting: Medical

## 2019-01-28 NOTE — Telephone Encounter (Signed)
Left message on voicemail for patient's wife to call back to get appointment info.  Patient has appointment Brandon [name] on 01-30-2019 at 330 pm at [facility].

## 2019-01-29 ENCOUNTER — Other Ambulatory Visit: Payer: Self-pay | Admitting: Medical

## 2019-01-30 ENCOUNTER — Other Ambulatory Visit: Payer: Self-pay

## 2019-01-30 ENCOUNTER — Other Ambulatory Visit: Payer: Self-pay | Admitting: Medical

## 2019-02-03 ENCOUNTER — Other Ambulatory Visit: Payer: Self-pay

## 2019-02-03 ENCOUNTER — Telehealth: Payer: Self-pay | Admitting: Family Medicine

## 2019-02-03 ENCOUNTER — Ambulatory Visit
Admission: RE | Admit: 2019-02-03 | Discharge: 2019-02-03 | Disposition: A | Discharge status: Home / Self Care | Payer: BLUE CROSS/BLUE SHIELD | Source: Ambulatory Visit | Attending: Medical | Admitting: Medical

## 2019-02-03 ENCOUNTER — Other Ambulatory Visit: Payer: Self-pay | Admitting: Medical

## 2019-02-03 DIAGNOSIS — R1084 Generalized abdominal pain: Secondary | ICD-10-CM

## 2019-02-03 MED ORDER — IOPAMIDOL (ISOVUE-300) INJECTION 61%
125.0000 mL | Freq: Once | INTRAVENOUS | Status: AC | PRN
  Administered 2019-02-03: 125 mL via INTRAVENOUS

## 2019-02-03 MED ORDER — OXYCODONE-ACETAMINOPHEN 5-325 MG PO TABS: 1.0000 | ORAL_TABLET | ORAL | 0 refills | Status: DC | PRN

## 2019-02-03 MED ORDER — TAMSULOSIN HCL 0.4 MG PO CAPS: 0.4000 mg | ORAL_CAPSULE | Freq: Every day | ORAL | 1 refills | Status: DC

## 2019-02-03 NOTE — Telephone Encounter (Signed)
Wife called for imaging results  [phone]

## 2019-02-03 NOTE — Progress Notes (Signed)
I called and discussed the CT scan with wife who is on hippa  Findings include 4 mm nonobstructing calculus in the left ureter, atherosclerosis, fatty liver disease  I briefly discussed the atherosclerosis and fatty liver disease.  He is compliant with statin and aspirin.  I advised he needs to come back and discuss those things further with Dr.  Redmond School his PCP  The likely cause of his recent pain and hematuria is the 4 mm stone.  He is straining urine still, has not had any more symptoms since I last saw him.  He will begin Flomax, will have the pain medicine on hand just in case, will continue good hydration and will watch for stone to pass over the next week.  If he passes a stone he is instructed to save the stone material and bring it in a sterile urine cup that we gave him last visit  I advised he call back follow-up with Dr. Redmond School within the next week to give [place] an update whether the stone has passed or not.  We will hold off on urology referral for the next week as the stone very likely will pass  In the mean time we will continue referral to gastroenterology to Dr. Collene Mares for colonoscopy and abnormal finding at rectum

## 2019-02-04 ENCOUNTER — Telehealth: Payer: Self-pay | Admitting: Family Medicine

## 2019-02-04 ENCOUNTER — Other Ambulatory Visit: Payer: Self-pay

## 2019-02-04 DIAGNOSIS — E785 Hyperlipidemia, unspecified: Secondary | ICD-10-CM

## 2019-02-04 MED ORDER — ATORVASTATIN CALCIUM 80 MG PO TABS: 80.0000 mg | ORAL_TABLET | Freq: Every day | ORAL | 3 refills | Status: DC

## 2019-02-04 NOTE — Telephone Encounter (Signed)
Done KH 

## 2019-02-04 NOTE — Telephone Encounter (Signed)
Wife called for Lipitor refills to [facility].

## 2019-02-04 NOTE — Telephone Encounter (Signed)
Pt was called and LVM to advise of med being sent to [facility]. [place]

## 2019-03-20 ENCOUNTER — Telehealth: Payer: Self-pay | Admitting: Family Medicine

## 2019-03-20 ENCOUNTER — Other Ambulatory Visit: Payer: Self-pay | Admitting: Medical

## 2019-03-20 MED ORDER — OXYCODONE-ACETAMINOPHEN 5-325 MG PO TABS: 1.0000 | ORAL_TABLET | ORAL | 0 refills | Status: DC | PRN

## 2019-03-20 NOTE — Telephone Encounter (Signed)
Pt can not do today as a  Virtual visit but can tomorrow. He will try to catch the stone.

## 2019-03-20 NOTE — Telephone Encounter (Signed)
Wife called and states Brandon [name] has a stone that feels like it is lodged in the end of his penis.  And it feels like it is cutting. Would you be able to send him another rx for pain med to [facility] in [place]?  He has been in pain for 2 days.

## 2019-03-20 NOTE — Telephone Encounter (Signed)
I sent pain medication.  I recommend a virtual visit today with one of [place] to discuss and to follow up from symptoms from last visit.   If he has a cup or sterile urine cup to collect the stone for analysis, that would be helpful.

## 2019-03-20 NOTE — Telephone Encounter (Signed)
Will talk tomorrow and notified about pain med

## 2019-03-21 ENCOUNTER — Other Ambulatory Visit: Payer: Self-pay

## 2019-03-21 ENCOUNTER — Encounter: Payer: Self-pay | Admitting: Medical

## 2019-03-21 ENCOUNTER — Ambulatory Visit (INDEPENDENT_AMBULATORY_CARE_PROVIDER_SITE_OTHER): Payer: Self-pay | Admitting: Medical

## 2019-03-21 VITALS — Ht 69.0 in | Wt 275.0 lb

## 2019-03-21 DIAGNOSIS — A630 Anogenital (venereal) warts: Secondary | ICD-10-CM

## 2019-03-21 DIAGNOSIS — Z87442 Personal history of urinary calculi: Secondary | ICD-10-CM | POA: Insufficient documentation

## 2019-03-21 DIAGNOSIS — R300 Dysuria: Secondary | ICD-10-CM

## 2019-03-21 DIAGNOSIS — N4889 Other specified disorders of penis: Secondary | ICD-10-CM

## 2019-03-21 DIAGNOSIS — N201 Calculus of ureter: Secondary | ICD-10-CM

## 2019-03-21 MED ORDER — TAMSULOSIN HCL 0.4 MG PO CAPS: 0.4000 mg | ORAL_CAPSULE | Freq: Every day | ORAL | 1 refills | Status: DC

## 2019-03-21 NOTE — Progress Notes (Signed)
Subjective:     Patient ID: Brandon [name], male   DOB: 1971-02-13, 48 y.o.   MRN: [id number]  This visit type was conducted due to national recommendations for restrictions regarding the COVID-19 Pandemic (e.g. social distancing) in an effort to limit this patient's exposure and mitigate transmission in our community.  This format is felt to be most appropriate for this patient at this time.    Documentation for virtual audio and video telecommunications through Zoom encounter:  The patient was located at home. The provider was located in the office. The patient did consent to this visit and is aware of possible charges through their insurance for this visit.  The other persons participating in this telemedicine service were none. Time spent on call was 15 minutes and in review of previous records >15 minutes total.  This virtual service is not related to other E/M service within previous 7 days.   HPI Chief Complaint  Patient presents with  . kidney stone    kidney stone 23mm moved to penis and painful coming and going    Here for penile pain.  I did visit with him in April for hematuria and CT scan at that time revealed 31mm ureteral stone.   After that visit it took a few days but the pain and hematuria stopped.  However, pain started back 2 weeks ago starting in the abdomen then working down to the groin.   He called in yesterday with c/o groin pain, pain like the stone was cutting inside his penis, and had some blood last week in urine but none in past week.    The pain is intermittent in the last few days, no pain currently.   He notes no fever, no nv, no diarrhea, no back pain.  He is almost out of flomax, and hasn't had a chance to go to pharmacy last night to get the pain medication called out.   He is hydrating well.   No other aggravating or relieving factors. No other complaint.  Past Medical History:  Diagnosis Date  . Arteriosclerotic cardiovascular disease (ASCVD) 2006,  2012   2006-acute IMI treated with urgent RCA stent; 2007-Cutting Balloon for in-stent restenosis; 02/2010-presented with ACS and minimal troponin elevation:70% LAD, 80% distal circumflex, 80% proximal ramus branch vessel,in-stent restenosis of 70% in the RCA; BMS for proximal critical RCA stenosis, restenosis Nov 2012  . Heart attack ([place])   . History of noncompliance with medical treatment    Due to financial considerations  . Hyperlipidemia   . Hypertension   . Tobacco abuse    40 pack years   Current Outpatient Medications on File Prior to Visit  Medication Sig Dispense Refill  . aspirin EC 81 MG tablet Take 81 mg by mouth daily.    Marland Kitchen atorvastatin (LIPITOR) 80 MG tablet Take 1 tablet (80 mg total) by mouth daily. 90 tablet 3  . carvedilol (COREG) 25 MG tablet Take 1 tablet (25 mg total) by mouth 2 (two) times daily with a meal. 180 tablet 3  . oxyCODONE-acetaminophen (PERCOCET) 5-325 MG tablet Take 1 tablet by mouth every 4 (four) hours as needed for severe pain. (Patient not taking: Reported on 03/21/2019) 10 tablet 0   Current Facility-Administered Medications on File Prior to Visit  Medication Dose Route Frequency Provider Last Rate Last Dose  . Influenza (>/= 3 years) inactive virus vaccine (FLVIRIN/FLUZONE) injection SUSP 0.5 mL  0.5 mL Intramuscular Once Fayrene Helper, MD  Review of Systems As in subjective    Objective:   Physical Exam  Ht 5\' 9"  (1.753 m)   Wt 275 lb (124.7 kg)   BMI 40.61 kg/m   Due to coronavirus pandemic stay at home measures, patient visit was virtual and they were not examined in person.        Assessment:     Encounter Diagnoses  Name Primary?  . Ureteral stone Yes  . Penile pain   . Dysuria   . Genital warts        Plan:     Discussed possible etiology but given symptoms and findings 01/23/19 with hematuria, similar pain, CT + for ureteral stone that never passed, currently symptoms suggest the stone is moving and trying to  pass.   C/t oxycodone prn, c/t flomax, increased water intake, and call back in 3 days with symptom update on Monday.  If worse over weekend, worse pain, problems passing urine, or other, then go to the ED.  Brandon [name] was seen today for kidney stone.  Diagnoses and all orders for this visit:  Ureteral stone  Penile pain  Dysuria  Genital warts  Other orders -     tamsulosin (FLOMAX) 0.4 MG CAPS capsule; Take 1 capsule (0.4 mg total) by mouth daily after supper.

## 2019-03-21 NOTE — Addendum Note (Signed)
Addended by: Carlena Hurl on: 03/21/2019 01:24 PM   Modules accepted: Level of Service

## 2019-03-25 ENCOUNTER — Telehealth: Payer: Self-pay | Admitting: Family Medicine

## 2019-03-25 NOTE — Telephone Encounter (Signed)
Wife called, states pt lost his insurance and can't afford to go to dermatologist for penile wart removal.  They want to know if we can do that here?  Also are the warts contagious?

## 2019-03-26 NOTE — Telephone Encounter (Signed)
Have him do visit with Dr. Redmond School as he can probably Hyphercate or treat some of them.    If I recall, one was bigger, but have him do f/u with Dr. Redmond School

## 2019-03-26 NOTE — Telephone Encounter (Signed)
Dr Redmond School would you be able to remove the warts in the office?

## 2019-03-27 NOTE — Telephone Encounter (Signed)
Advised wife, Dr Redmond School will do a couple in the office if needed.  If multiple, best done at a dermatologist.  They will discuss and advise.

## 2019-07-07 ENCOUNTER — Other Ambulatory Visit: Payer: Self-pay

## 2019-07-07 DIAGNOSIS — Z20822 Contact with and (suspected) exposure to covid-19: Secondary | ICD-10-CM

## 2019-07-08 ENCOUNTER — Telehealth: Payer: Self-pay | Admitting: Hematology

## 2019-07-08 LAB — NOVEL CORONAVIRUS, NAA: SARS-CoV-2, NAA: NOT DETECTED

## 2019-07-08 NOTE — Telephone Encounter (Signed)
Pt wife Brandon [name] is aware covid 16 results is negative

## 2019-10-31 ENCOUNTER — Other Ambulatory Visit: Payer: Self-pay

## 2019-10-31 ENCOUNTER — Encounter: Payer: Self-pay | Admitting: Family Medicine

## 2019-10-31 ENCOUNTER — Ambulatory Visit (INDEPENDENT_AMBULATORY_CARE_PROVIDER_SITE_OTHER): Payer: 59 | Admitting: Family Medicine

## 2019-10-31 ENCOUNTER — Ambulatory Visit
Admission: RE | Admit: 2019-10-31 | Discharge: 2019-10-31 | Disposition: A | Discharge status: Home / Self Care | Payer: 59 | Source: Ambulatory Visit | Attending: Family Medicine | Admitting: Family Medicine

## 2019-10-31 VITALS — BP 138/88 | HR 85 | Temp 96.9°F | Wt 300.8 lb

## 2019-10-31 DIAGNOSIS — M25561 Pain in right knee: Secondary | ICD-10-CM

## 2019-10-31 DIAGNOSIS — G8929 Other chronic pain: Secondary | ICD-10-CM

## 2019-10-31 DIAGNOSIS — E785 Hyperlipidemia, unspecified: Secondary | ICD-10-CM | POA: Diagnosis not present

## 2019-10-31 DIAGNOSIS — I2510 Atherosclerotic heart disease of native coronary artery without angina pectoris: Secondary | ICD-10-CM | POA: Diagnosis not present

## 2019-10-31 DIAGNOSIS — M25571 Pain in right ankle and joints of right foot: Secondary | ICD-10-CM

## 2019-10-31 DIAGNOSIS — I10 Essential (primary) hypertension: Secondary | ICD-10-CM | POA: Diagnosis not present

## 2019-10-31 MED ORDER — CARVEDILOL 25 MG PO TABS: 25.0000 mg | ORAL_TABLET | Freq: Two times a day (BID) | ORAL | 3 refills | Status: DC

## 2019-10-31 MED ORDER — ATORVASTATIN CALCIUM 80 MG PO TABS: 80.0000 mg | ORAL_TABLET | Freq: Every day | ORAL | 3 refills | Status: DC

## 2019-10-31 NOTE — Progress Notes (Signed)
   Subjective:    Patient ID: Brandon [name], male    DOB: 1971/09/01, 49 y.o.   MRN: [id number]  HPI He is here for consult concerning multiple issues.  He complains of a 23-month history of right ankle pain.  He does remember spraining the ankle laterally and since then has had difficulty with walking.  It is intermittent in nature but usually gives him trouble more or less on a daily basis.  No popping or locking there.  He also complains of a 2-week history of right knee pain that does indeed lock and click.  The pain will go away after he unlocks it.  No history of injury or overuse.  No other joints are involved. He also needs refills on several medications.  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  He does not complain of chest pain, shortness of breath, PND or DOE.   Review of Systems     Objective:   Physical Exam Alert and in no distress.  Exam of the left knee shows no swelling or palpable tenderness to the joint line patella or patellar tendon.  Medial and lateral collateral ligaments intact.  McMurray's testing was uncomfortable. Exam of the right ankle shows full motion with normal strength and no laxity.  Minimal tenderness palpation over the ATF.      Assessment & Plan:  Acute pain of right knee - Plan: DG Knee Complete 4 Views Right  Hyperlipidemia, unspecified hyperlipidemia type - Plan: Lipid panel, atorvastatin (LIPITOR) 80 MG tablet  Arteriosclerotic cardiovascular disease (ASCVD) - Plan: CBC with Differential, Comprehensive metabolic panel, carvedilol (COREG) 25 MG tablet  Essential hypertension - Plan: CBC with Differential, Comprehensive metabolic panel, carvedilol (COREG) 25 MG tablet  Chronic pain of right ankle - Plan: DG Ankle Complete Right I explained that the ankle will probably best be handled by physical therapy however the knee because of the locking, referral to orthopedics will probably be  made. 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

## 2019-10-31 NOTE — Patient Instructions (Signed)
Take 2 Aleve twice per day regularly for the next 10 days to 2 weeks.  You can also take Tylenol on as-needed basis if you need help with that.

## 2019-11-01 LAB — COMPREHENSIVE METABOLIC PANEL
ALT: 42 IU/L (ref 0–44)
AST: 36 IU/L (ref 0–40)
Albumin/Globulin Ratio: 1.6 (ref 1.2–2.2)
Albumin: 4.4 g/dL (ref 4.0–5.0)
Alkaline Phosphatase: 77 IU/L (ref 39–117)
BUN/Creatinine Ratio: 18 (ref 9–20)
BUN: 16 mg/dL (ref 6–24)
Bilirubin Total: 0.5 mg/dL (ref 0.0–1.2)
CO2: 26 mmol/L (ref 20–29)
Calcium: 9.6 mg/dL (ref 8.7–10.2)
Chloride: 101 mmol/L (ref 96–106)
Creatinine, Ser: 0.9 mg/dL (ref 0.76–1.27)
GFR calc Af Amer: 115 mL/min/{1.73_m2} (ref >59)
GFR calc non Af Amer: 99 mL/min/{1.73_m2} (ref >59)
Globulin, Total: 2.7 g/dL (ref 1.5–4.5)
Glucose: 101 mg/dL — ABNORMAL HIGH (ref 65–99)
Potassium: 4.1 mmol/L (ref 3.5–5.2)
Sodium: 143 mmol/L (ref 134–144)
Total Protein: 7.1 g/dL (ref 6.0–8.5)

## 2019-11-01 LAB — CBC WITH DIFFERENTIAL/PLATELET
Basophils Absolute: 0.1 10*3/uL (ref 0.0–0.2)
Basos: 1 %
EOS (ABSOLUTE): 0.4 10*3/uL (ref 0.0–0.4)
Eos: 4 %
Hematocrit: 47.5 % (ref 37.5–51.0)
Hemoglobin: 16.5 g/dL (ref 13.0–17.7)
Immature Grans (Abs): 0 10*3/uL (ref 0.0–0.1)
Immature Granulocytes: 0 %
Lymphocytes Absolute: 3.5 10*3/uL — ABNORMAL HIGH (ref 0.7–3.1)
Lymphs: 38 %
MCH: 31.9 pg (ref 26.6–33.0)
MCHC: 34.7 g/dL (ref 31.5–35.7)
MCV: 92 fL (ref 79–97)
Monocytes Absolute: 0.4 10*3/uL (ref 0.1–0.9)
Monocytes: 4 %
Neutrophils Absolute: 4.9 10*3/uL (ref 1.4–7.0)
Neutrophils: 53 %
Platelets: 172 10*3/uL (ref 150–450)
RBC: 5.17 x10E6/uL (ref 4.14–5.80)
RDW: 13.5 % (ref 11.6–15.4)
WBC: 9.3 10*3/uL (ref 3.4–10.8)

## 2019-11-01 LAB — LIPID PANEL
Chol/HDL Ratio: 7.5 ratio — ABNORMAL HIGH (ref 0.0–5.0)
Cholesterol, Total: 276 mg/dL — ABNORMAL HIGH (ref 100–199)
HDL: 37 mg/dL — ABNORMAL LOW (ref >39)
LDL Chol Calc (NIH): 194 mg/dL — ABNORMAL HIGH (ref 0–99)
Triglycerides: 230 mg/dL — ABNORMAL HIGH (ref 0–149)
VLDL Cholesterol Cal: 45 mg/dL — ABNORMAL HIGH (ref 5–40)

## 2019-11-03 ENCOUNTER — Telehealth: Payer: Self-pay | Admitting: Family Medicine

## 2019-11-03 ENCOUNTER — Other Ambulatory Visit: Payer: Self-pay

## 2019-11-03 DIAGNOSIS — M25561 Pain in right knee: Secondary | ICD-10-CM

## 2019-11-03 DIAGNOSIS — G8929 Other chronic pain: Secondary | ICD-10-CM

## 2019-11-03 NOTE — Telephone Encounter (Signed)
Debra left me a message that Gene had talked with you regarding physical therapy.  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  Or have PT come to their home?

## 2019-11-04 NOTE — Telephone Encounter (Signed)
Pt was advised by vm that referral was put in for PT in [place]. [place]

## 2019-11-05 ENCOUNTER — Telehealth: Payer: Self-pay | Admitting: Family Medicine

## 2019-11-05 NOTE — Telephone Encounter (Signed)
Pt wife was advised that the referral was put  For right knee and ankle. [place]

## 2019-11-05 NOTE — Telephone Encounter (Signed)
Hilda Blades called and states his knee needs to be included in his physical therapy.  PT is telling her we ordered just for the ankle.  Please advise.

## 2019-11-12 ENCOUNTER — Encounter (HOSPITAL_COMMUNITY): Payer: Self-pay | Admitting: Physical Therapy

## 2019-11-12 ENCOUNTER — Other Ambulatory Visit: Payer: Self-pay

## 2019-11-12 ENCOUNTER — Ambulatory Visit (HOSPITAL_COMMUNITY): Payer: 59 | Attending: Family Medicine | Admitting: Physical Therapy

## 2019-11-12 DIAGNOSIS — M25571 Pain in right ankle and joints of right foot: Secondary | ICD-10-CM | POA: Insufficient documentation

## 2019-11-12 DIAGNOSIS — R2689 Other abnormalities of gait and mobility: Secondary | ICD-10-CM | POA: Insufficient documentation

## 2019-11-12 DIAGNOSIS — M25561 Pain in right knee: Secondary | ICD-10-CM | POA: Insufficient documentation

## 2019-11-12 NOTE — Therapy (Signed)
[place], [place], [address] Phone: [phone]   Fax:  [phone]  Physical Therapy Evaluation  Patient Details  Name: Brandon [name] MRN: [id number] Date of Birth: 27-Jun-1971 Referring Provider (PT): Jill Alexanders MD   Encounter Date: 11/12/2019  PT End of Session - 11/12/19 1520    Visit Number  1    Number of Visits  8    Date for PT Re-Evaluation  12/12/19    Authorization Type  [employer] (No auth req)    PT Start Time  1435    PT Stop Time  1515    PT Time Calculation (min)  40 min    Activity Tolerance  Patient tolerated treatment well       Past Medical History:  Diagnosis Date  . Arteriosclerotic cardiovascular disease (ASCVD) 2006, 2012   2006-acute IMI treated with urgent RCA stent; 2007-Cutting Balloon for in-stent restenosis; 02/2010-presented with ACS and minimal troponin elevation:70% LAD, 80% distal circumflex, 80% proximal ramus branch vessel,in-stent restenosis of 70% in the RCA; BMS for proximal critical RCA stenosis, restenosis Nov 2012  . Heart attack ([place])   . History of noncompliance with medical treatment    Due to financial considerations  . Hyperlipidemia   . Hypertension   . Tobacco abuse    40 pack years    Past Surgical History:  Procedure Laterality Date  . CORONARY ANGIOPLASTY WITH STENT PLACEMENT      There were no vitals filed for this visit.   Subjective Assessment - 11/12/19 1442    Subjective  Patient presents to physical therapy with complaint of RT knee and ankle pain. Patient says his ankle began hurting him about 6 months ago, walking and standing. patient says he is unsure why but reports having ankle fracture when he was a child. Patient says his RT knee began hurting him about 1-2 months ago, and is now more painful of the two. Patient says he had recent xrays which he says was unremarkable. Patient says MD prescribed him ibuprofen which he says has  helped his function a little.    Limitations  Sitting;Lifting;Standing;Walking;House hold activities    How long can you stand comfortably?  Can stand all day, just hurts    How long can you walk comfortably?  Can do, just hurts    Diagnostic tests  Xrays    Patient Stated Goals  Get out of pain    Currently in Pain?  Yes    Pain Score  5     Pain Orientation  Right;Anterior;Mid    Pain Descriptors / Indicators  Sharp;Stabbing    Pain Type  Acute pain    Pain Onset  More than a month ago    Pain Frequency  Constant    Aggravating Factors   standing, walking, bending    Pain Relieving Factors  rest, meds    Effect of Pain on Daily Activities  limits    Multiple Pain Sites  Yes    Pain Score  3    Pain Location  Ankle    Pain Orientation  Right;Posterior;Lower    Pain Descriptors / Indicators  Sharp    Pain Type  Acute pain    Pain Onset  More than a month ago    Pain Frequency  Constant    Aggravating Factors   standing, walking    Pain Relieving Factors  rest, meds    Effect of Pain on Daily Activities  Limits         [facility] PT Assessment - 11/12/19 0001      Assessment   Medical Diagnosis  RT knee and ankle pain     Referring Provider (PT)  Jill Alexanders MD    Onset Date/Surgical Date  --   july 2020   Next MD Visit  None scheduled    Prior Therapy  No      Precautions   Precautions  --   Hx of heart attack, has 3 cardiac stents     Restrictions   Weight Bearing Restrictions  No      Balance Screen   Has the patient fallen in the past 6 months  No      [place] residence    Living Arrangements  Spouse/significant other    [place] to enter    Entrance Stairs-Number of Steps  1      Prior Function   Level of Independence  Independent      Cognition   Overall Cognitive Status  Within Functional Limits for tasks assessed      Observation/Other Assessments   Focus on Therapeutic Outcomes (FOTO)   41% limited        Sensation   Light Touch  Appears Intact      ROM / Strength   AROM / PROM / Strength  AROM;Strength      AROM   Overall AROM Comments  RT hip knee and ankle AROM WFL, but with pain at end range hip flexion, knee flex/ext     AROM Assessment Site  Hip;Knee;Ankle    Right/Left Hip  Right;Left      Strength   Strength Assessment Site  Hip;Knee;Ankle    Right/Left Hip  Right;Left    Right Hip Flexion  4-/5    Right Hip Extension  4-/5    Right Hip ABduction  4-/5    Left Hip Flexion  5/5    Left Hip Extension  4+/5    Left Hip ABduction  4+/5    Right/Left Knee  Right;Left    Right Knee Flexion  4/5    Right Knee Extension  4/5   with pain    Left Knee Flexion  5/5    Left Knee Extension  5/5    Right/Left Ankle  Right;Left    Right Ankle Dorsiflexion  4+/5    Left Ankle Dorsiflexion  5/5      Flexibility   Soft Tissue Assessment /Muscle Length  yes    Quadriceps  Min/mod restriction on RT      Palpation   Palpation comment  Mod tenderness to palpation about the medial RT knee joint line       Ambulation/Gait   Ambulation/Gait  Yes    Ambulation/Gait Assistance  7: Independent    Assistive device  None    Gait Pattern  Decreased stance time - right;Decreased dorsiflexion - right;Antalgic    Ambulation Surface  Level;Indoor    Gait velocity  [facility]      Balance   Balance Assessed  Yes      Static Standing Balance   Static Standing Balance -  Activities   Tandam Stance - Right Leg;Tandam Stance - Left Leg;Single Leg Stance - Right Leg;Single Leg Stance - Left Leg    Static Standing - Comment/# of Minutes  25 sec; 25 sec mod sway; 15 sec; 15 sec  Objective measurements completed on examination: See above findings.              PT Education - 11/12/19 1445    Education Details  Pateitn educated on evaluation findings, POC and HEP    Person(s) Educated  Patient    Methods  Explanation    Comprehension  Verbalized understanding        PT Short Term Goals - 11/12/19 1524      PT SHORT TERM GOAL #1   Title  Patient will be independent with initial HEP to improve functional outcomes    Time  2    Period  Weeks    Status  New    Target Date  11/28/19        PT Long Term Goals - 11/12/19 1525      PT LONG TERM GOAL #1   Title  Patient will improve FOTO score to <30% to indicate improvement in functional outcomes    Time  4    Period  Weeks    Status  New    Target Date  12/12/19      PT LONG TERM GOAL #2   Title  Patient will have equal to or > 4+/5 MMT throughout RLE to improve ability to perform functional mobility, stair ambulation and ADLs.    Time  4    Period  Weeks    Status  New    Target Date  12/12/19      PT LONG TERM GOAL #3   Title  Patient will report at least 50% overall improvement in subjective complaint to indicate improvement in ability to perform ADLs.    Time  4    Period  Weeks    Status  New    Target Date  12/12/19             Plan - 11/12/19 1521    Clinical Impression Statement  Patient is a 49 y.o. Male who presents to physical therapy with complaint of RT knee and ankle pain. Patient demonstrates decreased strength, flexibility restriction, balance deficits and gait abnormalities which are likely contributing to symptoms of pain and are negatively impacting patient ability to perform ADLs and functional mobility tasks. Patient will benefit from skilled physical therapy services to address these deficits to reduce pain, improve level of function with ADLs, and functional mobility tasks.    Examination-Activity Limitations  Lift;Stand;Locomotion Level;Bend;Transfers;Stairs;Squat;Carry    Examination-Participation Restrictions  Yard Work;Community Activity    Stability/Clinical Decision Making  Stable/Uncomplicated    Clinical Decision Making  Low    Rehab Potential  Good    PT Frequency  2x / week    PT Duration  4 weeks    PT Treatment/Interventions  ADLs/Self Care  Home Management;Therapeutic exercise;Therapeutic activities;Patient/family education;Functional mobility training;Stair training;Neuromuscular re-education;Gait training;Balance training;DME Instruction;Manual techniques;Joint Manipulations;Taping;Energy conservation;Orthotic Fit/Training    PT Next Visit Plan  Review goals, progress RLE strength and mobility as tolerated. Add slant borad, knee drivers, mini squats, SLS, sidestepping, rocker board as tolerated    PT Home Exercise Plan  11/12/19: seated HS stretch, quad set    Consulted and Agree with Plan of Care  Patient       Patient will benefit from skilled therapeutic intervention in order to improve the following deficits and impairments:  Abnormal gait, Pain, Decreased activity tolerance, Decreased endurance, Decreased range of motion, Hypomobility, Decreased balance, Difficulty walking, Decreased strength, Decreased mobility  Visit Diagnosis: Right knee pain, unspecified chronicity  Pain  in right ankle and joints of right foot  Other abnormalities of gait and mobility     Problem List Patient Active Problem List   Diagnosis Date Noted  . Penile pain 03/21/2019  . Ureteral stone 03/21/2019  . Dysuria 03/21/2019  . Abdominal pain 01/23/2019  . Genital warts 01/23/2019  . Screen for colon cancer 01/23/2019  . Rectal abnormality 01/23/2019  . History of renal calculi 01/22/2019  . Left lower quadrant abdominal pain 01/22/2019  . Hematuria 01/22/2019  . 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 Sleep apnea 12/29/2012  . Arteriosclerotic cardiovascular disease (ASCVD)   . Tobacco abuse   . History of noncompliance with medical treatment   . Hyperlipidemia 05/30/2011  . Hypertension 05/30/2011  . Obesity 05/30/2011   3:27 PM, 11/12/19 Josue Hector PT DPT  Physical Therapist with [facility]  [phone] [place], [place], [address] Phone: [phone]   Fax:  [phone]  Name: Brandon [name] MRN: [id number] Date of Birth: 1971-01-06

## 2019-11-18 ENCOUNTER — Telehealth (HOSPITAL_COMMUNITY): Payer: Self-pay | Admitting: Physical Therapy

## 2019-11-18 NOTE — Telephone Encounter (Signed)
pt does not want to start therapy until next week

## 2019-11-19 ENCOUNTER — Ambulatory Visit (HOSPITAL_COMMUNITY): Payer: 59 | Admitting: Physical Therapy

## 2019-11-20 ENCOUNTER — Ambulatory Visit (HOSPITAL_COMMUNITY): Payer: 59 | Admitting: Physical Therapy

## 2019-11-26 ENCOUNTER — Ambulatory Visit (HOSPITAL_COMMUNITY): Payer: 59 | Attending: Family Medicine | Admitting: Physical Therapy

## 2019-11-26 ENCOUNTER — Other Ambulatory Visit: Payer: Self-pay

## 2019-11-26 ENCOUNTER — Encounter (HOSPITAL_COMMUNITY): Payer: Self-pay | Admitting: Physical Therapy

## 2019-11-26 ENCOUNTER — Telehealth (HOSPITAL_COMMUNITY): Payer: Self-pay

## 2019-11-26 DIAGNOSIS — M25571 Pain in right ankle and joints of right foot: Secondary | ICD-10-CM | POA: Diagnosis present

## 2019-11-26 DIAGNOSIS — R2689 Other abnormalities of gait and mobility: Secondary | ICD-10-CM

## 2019-11-26 DIAGNOSIS — M25561 Pain in right knee: Secondary | ICD-10-CM | POA: Insufficient documentation

## 2019-11-26 NOTE — Therapy (Signed)
[place], [place], [address] Phone: [phone]   Fax:  [phone]  Physical Therapy Treatment  Patient Details  Name: Brandon [name] MRN: [id number] Date of Birth: Apr 28, 1971 Referring Provider (PT): Jill Alexanders MD   Encounter Date: 11/26/2019  PT End of Session - 11/26/19 [id number]    Visit Number  2    Number of Visits  8    Date for PT Re-Evaluation  12/12/19    Authorization Type  [employer] (No auth req)    PT Start Time  1645    PT Stop Time  1725    PT Time Calculation (min)  40 min    Activity Tolerance  Patient tolerated treatment well       Past Medical History:  Diagnosis Date  . Arteriosclerotic cardiovascular disease (ASCVD) 2006, 2012   2006-acute IMI treated with urgent RCA stent; 2007-Cutting Balloon for in-stent restenosis; 02/2010-presented with ACS and minimal troponin elevation:70% LAD, 80% distal circumflex, 80% proximal ramus branch vessel,in-stent restenosis of 70% in the RCA; BMS for proximal critical RCA stenosis, restenosis Nov 2012  . Heart attack ([place])   . History of noncompliance with medical treatment    Due to financial considerations  . Hyperlipidemia   . Hypertension   . Tobacco abuse    40 pack years    Past Surgical History:  Procedure Laterality Date  . CORONARY ANGIOPLASTY WITH STENT PLACEMENT      There were no vitals filed for this visit.  Subjective Assessment - 11/26/19 1642    Subjective  Patient says his ankle is hurting a little today, knee is not too bad. Patient says he forgot about his HEP and has not done them. Says he has a rough week last week and was not feeling well but is feeling better today.    Limitations  Sitting;Lifting;Standing;Walking;House hold activities    How long can you stand comfortably?  Can stand all day, just hurts    How long can you walk comfortably?  Can do, just hurts    Diagnostic tests  Xrays    Patient Stated Goals  Get  out of pain    Currently in Pain?  Yes    Pain Score  4     Pain Location  Knee    Pain Orientation  Right    Pain Descriptors / Indicators  Aching    Pain Type  Acute pain    Pain Onset  More than a month ago    Pain Onset  More than a month ago                       [facility] Adult PT Treatment/Exercise - 11/26/19 0001      Exercises   Exercises  Knee/Hip;Ankle      Knee/Hip Exercises: Stretches   Passive Hamstring Stretch  Right;5 reps;10 seconds    Passive Hamstring Stretch Limitations  seated    Knee: Self-Stretch to increase Flexion  Right;5 reps;10 seconds    Gastroc Stretch  Both;3 reps;30 seconds      Knee/Hip Exercises: Machines for Data processing manager walkouts 40# x10      Knee/Hip Exercises: Standing   Heel Raises  20 reps    Forward Step Up  Right;Hand Hold: 0;Step Height: 6";15 reps    Rocker Board  1 minute   front/ back; side/ side   SLS  3 x 10" each  Other Standing Knee Exercises  sidestepping, 15" 3 RT      Knee/Hip Exercises: Seated   Sit to Sand  15 reps;without UE support      Ankle Exercises: Seated   BAPS  Sitting;Level 2;10 reps    BAPS Limitations  front/back; side/ side                PT Short Term Goals - 11/12/19 1524      PT SHORT TERM GOAL #1   Title  Patient will be independent with initial HEP to improve functional outcomes    Time  2    Period  Weeks    Status  New    Target Date  11/28/19        PT Long Term Goals - 11/12/19 1525      PT LONG TERM GOAL #1   Title  Patient will improve FOTO score to <30% to indicate improvement in functional outcomes    Time  4    Period  Weeks    Status  New    Target Date  12/12/19      PT LONG TERM GOAL #2   Title  Patient will have equal to or > 4+/5 MMT throughout RLE to improve ability to perform functional mobility, stair ambulation and ADLs.    Time  4    Period  Weeks    Status  New    Target Date  12/12/19      PT LONG TERM GOAL  #3   Title  Patient will report at least 50% overall improvement in subjective complaint to indicate improvement in ability to perform ADLs.    Time  4    Period  Weeks    Status  New    Target Date  12/12/19            Plan - 11/26/19 1730    Clinical Impression Statement  Patient tolerated session well today. Reviewed goals and HEP. Patient noted increased discomfort in Rt ankle with BAPs ankle inversion. Progress balance and LE strengthening activity. Patient required cueing and demo for all added exercise, but displayed good return. Patient was most challenged with maintaining SLB on RLE, but was able to achieve 10 sec max. Patient cued on hold times for stretching. Patient reports decreased pain in RT knee and ankle post session. Encouraged patient to perform HEP exercise to reduce pain and improve strengthening at home. Will continue to progress activity to tolerance.    Examination-Activity Limitations  Lift;Stand;Locomotion Level;Bend;Transfers;Stairs;Squat;Carry    Examination-Participation Restrictions  Yard Work;Community Activity    Stability/Clinical Decision Making  Stable/Uncomplicated    Rehab Potential  Good    PT Frequency  2x / week    PT Duration  4 weeks    PT Treatment/Interventions  ADLs/Self Care Home Management;Therapeutic exercise;Therapeutic activities;Patient/family education;Functional mobility training;Stair training;Neuromuscular re-education;Gait training;Balance training;DME Instruction;Manual techniques;Joint Manipulations;Taping;Energy conservation;Orthotic Fit/Training    PT Next Visit Plan  Continue to progress RLE strenght and mobility as tolerated. Add step downs, band sidestepping, and balance on foam next visit.    PT Home Exercise Plan  11/12/19: seated HS stretch, quad set    Consulted and Agree with Plan of Care  Patient       Patient will benefit from skilled therapeutic intervention in order to improve the following deficits and impairments:   Abnormal gait, Pain, Decreased activity tolerance, Decreased endurance, Decreased range of motion, Hypomobility, Decreased balance, Difficulty walking, Decreased strength, Decreased mobility  Visit  Diagnosis: Right knee pain, unspecified chronicity  Pain in right ankle and joints of right foot  Other abnormalities of gait and mobility     Problem List Patient Active Problem List   Diagnosis Date Noted  . Penile pain 03/21/2019  . Ureteral stone 03/21/2019  . Dysuria 03/21/2019  . Abdominal pain 01/23/2019  . Genital warts 01/23/2019  . Screen for colon cancer 01/23/2019  . Rectal abnormality 01/23/2019  . History of renal calculi 01/22/2019  . Left lower quadrant abdominal pain 01/22/2019  . Hematuria 01/22/2019  . 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 Sleep apnea 12/29/2012  . Arteriosclerotic cardiovascular disease (ASCVD)   . Tobacco abuse   . History of noncompliance with medical treatment   . Hyperlipidemia 05/30/2011  . Hypertension 05/30/2011  . Obesity 05/30/2011    5:36 PM, 11/26/19 Josue Hector PT DPT  Physical Therapist with [facility]  [phone] [place], [place], [address] Phone: [phone]   Fax:  [phone]  Name: Brandon [name] MRN: [id number] Date of Birth: [birth year]

## 2019-11-26 NOTE — Telephone Encounter (Signed)
pt cancelled this appt on 2/5 because he has a conflict with another MD appt

## 2019-11-28 ENCOUNTER — Other Ambulatory Visit: Payer: Self-pay

## 2019-11-28 ENCOUNTER — Ambulatory Visit (HOSPITAL_COMMUNITY): Payer: 59

## 2019-11-28 ENCOUNTER — Encounter: Payer: Self-pay | Admitting: Cardiovascular Disease

## 2019-11-28 ENCOUNTER — Ambulatory Visit (INDEPENDENT_AMBULATORY_CARE_PROVIDER_SITE_OTHER): Payer: 59 | Admitting: Cardiovascular Disease

## 2019-11-28 VITALS — BP 109/74 | HR 86 | Temp 97.5°F | Ht 69.0 in | Wt 302.4 lb

## 2019-11-28 DIAGNOSIS — R0989 Other specified symptoms and signs involving the circulatory and respiratory systems: Secondary | ICD-10-CM | POA: Diagnosis not present

## 2019-11-28 DIAGNOSIS — I10 Essential (primary) hypertension: Secondary | ICD-10-CM

## 2019-11-28 DIAGNOSIS — Z720 Tobacco use: Secondary | ICD-10-CM | POA: Diagnosis not present

## 2019-11-28 DIAGNOSIS — I2510 Atherosclerotic heart disease of native coronary artery without angina pectoris: Secondary | ICD-10-CM

## 2019-11-28 NOTE — Assessment & Plan Note (Signed)
Ongoing tobacco abuse of 1 pack/day recalcitrant to respect modification.

## 2019-11-28 NOTE — Progress Notes (Signed)
11/28/2019 Brandon [name]   05-Nov-1970  [id number]  Primary Physician Denita Lung, MD Primary Cardiologist: Lorretta Harp MD Lupe Carney, [place]  HPI:  Brandon [name] is a 49 y.o.  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 He was initially referred by Dr. Redmond School because of lower extremity edema which has since resolved after changing jobs. 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 His risk factors include 1-1/2 packs a day of tobacco abuse having smoked 35 years as well as treated hypertension and hyperlipidemia. His father had a heart attack at age 78 and died. 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 He was recathed here later found to have in-stent restenosis as well as recently intervened on. I Him 02/24/10 the setting of a non-STEMI revealed revealing a new 75% in-stent restenosis within the RCA stent and a new 99% proximal lesion both of which were intervened on. He's had no problems since.  I have not seen him for close to 2 years.  He did run out of his statin drug for 6 months and had a lipid profile done a month ago revealing total cholesterol 276, LDL 194 and HDL of 37.  He apparently has been put back on a statin drug.  He denies chest pain or shortness of breath.  He does continue to smoke a pack a day however.  Current Meds  Medication Sig  . aspirin EC 81 MG tablet Take 81 mg by mouth daily.  Marland Kitchen atorvastatin (LIPITOR) 80 MG tablet Take 1 tablet (80 mg total) by mouth daily.  . carvedilol (COREG) 25 MG tablet Take 1 tablet (25 mg total) by mouth 2 (two) times daily with a meal.  . fluticasone (FLONASE) 50 MCG/ACT nasal spray Place into the nose.  . oxyCODONE-acetaminophen (PERCOCET) 5-325 MG tablet Take 1 tablet by mouth every 4 (four) hours as needed for severe  pain.  . tamsulosin (FLOMAX) 0.4 MG CAPS capsule Take 1 capsule (0.4 mg total) by mouth daily after supper.   Current Facility-Administered Medications for the 11/28/19 encounter (Office Visit) with Lorretta Harp, MD  Medication  . Influenza (>/= 3 years) inactive virus vaccine (FLVIRIN/FLUZONE) injection SUSP 0.5 mL     No Known Allergies  Social History   Socioeconomic History  . Marital status: Married    Spouse name: Not on file  . Number of children: 2  . Years of education: Not on file  . Highest education level: Not on file  Occupational History  . 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 Smoking status: Current Every Day Smoker    Packs/day: 1.00    Years: 27.00    Pack years: 27.00    Types: Cigarettes  . Smokeless tobacco: Never Used  Substance and Sexual Activity  . Alcohol use: No    Comment:  Alcoholism- quit 2002  . Drug use: No  . Sexual activity: Not on file  Other Topics Concern  . Not on file  Social History Narrative            Social Determinants of Health   Financial Resource Strain:   . Difficulty of Paying Living Expenses: Not on file  Food Insecurity:   . Worried About  Running Out of Food in the Last Year: Not on file  . Ran Out of Food in the Last Year: Not on file  Transportation Needs:   . Lack of Transportation (Medical): Not on file  . Lack of Transportation (Non-Medical): Not on file  Physical Activity:   . Days of Exercise per Week: Not on file  . Minutes of Exercise per Session: Not on file  Stress:   . Feeling of Stress : Not on file  Social Connections:   . Frequency of Communication with Friends and Family: Not on file  . Frequency of Social Gatherings with Friends and Family: Not on file  . Attends Religious Services: Not on file  . Active Member of Clubs or Organizations: Not on file  . Attends Archivist Meetings: Not on file  . Marital Status: Not on  file  Intimate Partner Violence:   . Fear of Current or Ex-Partner: Not on file  . Emotionally Abused: Not on file  . Physically Abused: Not on file  . Sexually Abused: Not on file     Review of Systems: General: negative for chills, fever, night sweats or weight changes.  Cardiovascular: negative for chest pain, dyspnea on exertion, edema, orthopnea, palpitations, paroxysmal nocturnal dyspnea or shortness of breath Dermatological: negative for rash Respiratory: negative for cough or wheezing Urologic: negative for hematuria Abdominal: negative for nausea, vomiting, diarrhea, bright red blood per rectum, melena, or hematemesis Neurologic: negative for visual changes, syncope, or dizziness All other systems reviewed and are otherwise negative except as noted above.    Blood pressure (!) 157/90, pulse 86, temperature (!) 97.5 F (36.4 C), height 5\' 9"  (1.753 m), weight (!) 302 lb 6.4 oz (137.2 kg), SpO2 92 %.  General appearance: alert and no distress Neck: no adenopathy, no JVD, supple, symmetrical, trachea midline, thyroid not enlarged, symmetric, no tenderness/mass/nodules and Right carotid bruit Lungs: clear to auscultation bilaterally Heart: regular rate and rhythm, S1, S2 normal, no murmur, click, rub or gallop Extremities: extremities normal, atraumatic, no cyanosis or edema Pulses: 2+ and symmetric Skin: Skin color, texture, turgor normal. No rashes or lesions Neurologic: Alert and oriented X 3, normal strength and tone. Normal symmetric reflexes. Normal coordination and gait  EKG sinus rhythm 85 small inferior Q waves and nonspecific ST and T wave changes.  I personally reviewed this EKG.  ASSESSMENT AND PLAN:   Hyperlipidemia History of hyperlipidemia on high-dose statin therapy.  He was out of his meds for 6 months because of financial considerations and his most recent lipid profile performed 10/31/2019 revealed total cholesterol 276, LDL of 194 and HDL of 37.  He was  placed back on his statin drug 2 months ago.  We will recheck a lipid liver profile.  Hypertension History of essential hypertension blood pressure measured today 157/90.  He is on carvedilol.  Follow-up blood pressure was 109/74.  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  He was recath after that and was found to have in-stent restenosis which was intervened on.  His last cath by me 02/24/2010 in the setting of non-STEMI revealed 75% in-stent restenosis within the RCA and new 99% lesion both of which were intervened on.  He has had 1 subsequent procedure in [place] in 2013.  Has been asymptomatic since.  Tobacco abuse Ongoing tobacco abuse of 1 pack/day recalcitrant to respect modification.      Pearletha Forge.  Gwenlyn Found MD [facility], [facility] 11/28/2019 4:06 PM

## 2019-11-28 NOTE — Patient Instructions (Signed)
Medication Instructions:  Your physician recommends that you continue on your current medications as directed. Please refer to the Current Medication list given to you today.  If you need a refill on your cardiac medications before your next appointment, please call your pharmacy.   Lab work: Fasting Lipids and Hepatic Function If you have labs (blood work) drawn today and your tests are completely normal, you will receive your results only by: MyChart Message (if you have MyChart) OR A paper copy in the mail If you have any lab test that is abnormal or we need to change your treatment, we will call you to review the results.  Testing/Procedures: Your physician has requested that you have a carotid duplex. This test is an ultrasound of the carotid arteries in your neck. It looks at blood flow through these arteries that supply the brain with blood. Allow one hour for this exam. There are no restrictions or special instructions.   Follow-Up: At [facility], you and your health needs are our priority.  As part of our continuing mission to provide you with exceptional heart care, we have created designated Provider Care Teams.  These Care Teams include your primary Cardiologist (physician) and Advanced Practice Providers (APPs -  Physician Assistants and Nurse Practitioners) who all work together to provide you with the care you need, when you need it. You may see Dr. Gwenlyn Found or one of the following Advanced Practice Providers on your designated Care Team:    [name], PA-C  [place], [place]  [name], [place]  Your physician wants you to follow-up in: 1 year with Dr. Gwenlyn Found

## 2019-11-28 NOTE — Assessment & Plan Note (Signed)
History of CAD status post inferior wall myocardial infarction 2006 treated with PCI and stenting of his RCA.  He was recath after that and was found to have in-stent restenosis which was intervened on.  His last cath by me 02/24/2010 in the setting of non-STEMI revealed 75% in-stent restenosis within the RCA and new 99% lesion both of which were intervened on.  He has had 1 subsequent procedure in [place] in 2013.  Has been asymptomatic since.

## 2019-11-28 NOTE — Assessment & Plan Note (Addendum)
History of essential hypertension blood pressure measured today 157/90.  He is on carvedilol.  Follow-up blood pressure was 109/74.

## 2019-11-28 NOTE — Assessment & Plan Note (Signed)
History of hyperlipidemia on high-dose statin therapy.  He was out of his meds for 6 months because of financial considerations and his most recent lipid profile performed 10/31/2019 revealed total cholesterol 276, LDL of 194 and HDL of 37.  He was placed back on his statin drug 2 months ago.  We will recheck a lipid liver profile.

## 2019-12-03 ENCOUNTER — Telehealth (HOSPITAL_COMMUNITY): Payer: Self-pay | Admitting: Physical Therapy

## 2019-12-03 ENCOUNTER — Ambulatory Visit (HOSPITAL_COMMUNITY): Payer: 59 | Admitting: Physical Therapy

## 2019-12-03 NOTE — Telephone Encounter (Signed)
pt is still at work and will not make his appt today

## 2019-12-05 ENCOUNTER — Telehealth (HOSPITAL_COMMUNITY): Payer: Self-pay | Admitting: Physical Therapy

## 2019-12-05 ENCOUNTER — Ambulatory Visit (HOSPITAL_COMMUNITY): Payer: 59 | Admitting: Physical Therapy

## 2019-12-05 LAB — HEPATIC FUNCTION PANEL
ALT: 47 IU/L — ABNORMAL HIGH (ref 0–44)
AST: 33 IU/L (ref 0–40)
Albumin: 4.3 g/dL (ref 4.0–5.0)
Alkaline Phosphatase: 101 IU/L (ref 39–117)
Bilirubin Total: 0.8 mg/dL (ref 0.0–1.2)
Bilirubin, Direct: 0.2 mg/dL (ref 0.00–0.40)
Total Protein: 7.1 g/dL (ref 6.0–8.5)

## 2019-12-05 LAB — LIPID PANEL
Chol/HDL Ratio: 5.1 ratio — ABNORMAL HIGH (ref 0.0–5.0)
Cholesterol, Total: 215 mg/dL — ABNORMAL HIGH (ref 100–199)
HDL: 42 mg/dL (ref >39)
LDL Chol Calc (NIH): 146 mg/dL — ABNORMAL HIGH (ref 0–99)
Triglycerides: 148 mg/dL (ref 0–149)
VLDL Cholesterol Cal: 27 mg/dL (ref 5–40)

## 2019-12-05 NOTE — Telephone Encounter (Signed)
Called regarding patient not showing for appointment this date. There was no answer so left voicemail reminding of next appointment and clinic phone number if patient needs to cancel or change appointment.  Clarene Critchley PT, DPT 4:27 PM, 12/05/19 [phone]

## 2019-12-10 ENCOUNTER — Ambulatory Visit (HOSPITAL_COMMUNITY)
Admission: RE | Admit: 2019-12-10 | Discharge: 2019-12-10 | Disposition: A | Discharge status: Home / Self Care | Payer: 59 | Source: Ambulatory Visit | Attending: Cardiology | Admitting: Cardiology

## 2019-12-10 ENCOUNTER — Other Ambulatory Visit: Payer: Self-pay

## 2019-12-10 ENCOUNTER — Ambulatory Visit (HOSPITAL_COMMUNITY): Payer: 59 | Admitting: Physical Therapy

## 2019-12-10 ENCOUNTER — Telehealth (HOSPITAL_COMMUNITY): Payer: Self-pay | Admitting: Physical Therapy

## 2019-12-10 DIAGNOSIS — R0989 Other specified symptoms and signs involving the circulatory and respiratory systems: Secondary | ICD-10-CM | POA: Diagnosis not present

## 2019-12-10 DIAGNOSIS — Z720 Tobacco use: Secondary | ICD-10-CM

## 2019-12-10 DIAGNOSIS — I2510 Atherosclerotic heart disease of native coronary artery without angina pectoris: Secondary | ICD-10-CM | POA: Diagnosis not present

## 2019-12-10 NOTE — Telephone Encounter (Signed)
Called patient at home number, but could not understand him due to poor reception. Called mobile number and his wife answered. stated that he had an MD appointment this afternoon and has blockage in his artery. Is unsure if he is able to continue therapy , but is aware of Friday appointment. Will call to cancel or request discharge from therapy if indicated.    6:38 PM, 12/10/19 Josue Hector PT DPT  Physical Therapist with [facility]  [phone]

## 2019-12-12 ENCOUNTER — Telehealth (INDEPENDENT_AMBULATORY_CARE_PROVIDER_SITE_OTHER): Payer: 59 | Admitting: Cardiovascular Disease

## 2019-12-12 ENCOUNTER — Telehealth: Payer: Self-pay | Admitting: Cardiovascular Disease

## 2019-12-12 ENCOUNTER — Other Ambulatory Visit: Payer: Self-pay

## 2019-12-12 ENCOUNTER — Telehealth (HOSPITAL_COMMUNITY): Payer: Self-pay | Admitting: Physical Therapy

## 2019-12-12 ENCOUNTER — Encounter: Payer: Self-pay | Admitting: Cardiovascular Disease

## 2019-12-12 ENCOUNTER — Ambulatory Visit (HOSPITAL_COMMUNITY): Payer: 59 | Admitting: Physical Therapy

## 2019-12-12 DIAGNOSIS — Z720 Tobacco use: Secondary | ICD-10-CM

## 2019-12-12 DIAGNOSIS — Z6841 Body Mass Index (BMI) 40.0 and over, adult: Secondary | ICD-10-CM

## 2019-12-12 DIAGNOSIS — E782 Mixed hyperlipidemia: Secondary | ICD-10-CM | POA: Diagnosis not present

## 2019-12-12 DIAGNOSIS — I2510 Atherosclerotic heart disease of native coronary artery without angina pectoris: Secondary | ICD-10-CM

## 2019-12-12 DIAGNOSIS — I10 Essential (primary) hypertension: Secondary | ICD-10-CM | POA: Diagnosis not present

## 2019-12-12 DIAGNOSIS — I252 Old myocardial infarction: Secondary | ICD-10-CM | POA: Diagnosis not present

## 2019-12-12 DIAGNOSIS — Z955 Presence of coronary angioplasty implant and graft: Secondary | ICD-10-CM

## 2019-12-12 NOTE — Addendum Note (Signed)
Addended by: Cain Sieve on: 12/12/2019 02:13 PM   Modules accepted: Orders

## 2019-12-12 NOTE — Patient Instructions (Signed)
Medication Instructions:  Your physician recommends that you continue on your current medications as directed. Please refer to the Current Medication list given to you today.  If you need a refill on your cardiac medications before your next appointment, please call your pharmacy.   Lab work: Fasting Lipids and Hepatic Function in 2 months. If you have labs (blood work) drawn today and your tests are completely normal, you will receive your results only by: [place] (if you have MyChart) OR A paper copy in the mail If you have any lab test that is abnormal or we need to change your treatment, we will call you to review the results.  Testing/Procedures: NONE  Follow-Up: At [facility], you and your health needs are our priority.  As part of our continuing mission to provide you with exceptional heart care, we have created designated Provider Care Teams.  These Care Teams include your primary Cardiologist (physician) and Advanced Practice Providers (APPs -  Physician Assistants and Nurse Practitioners) who all work together to provide you with the care you need, when you need it. You may see Dr. Gwenlyn Found or one of the following Advanced Practice Providers on your designated Care Team:    [name], PA-C  [place], [place]  [name], [place]  Your physician wants you to follow-up in: 6 months with Dr. Gwenlyn Found. You will receive a reminder letter in the mail two months in advance. If you don't receive a letter, please call our office to schedule the follow-up appointment.  Any Other Special Instructions Will Be Listed Below (If Applicable). Our pharmacist will reach out to you regarding starting a new medication called Repatha.

## 2019-12-12 NOTE — Progress Notes (Signed)
Virtual Visit via Telephone Note   This visit type was conducted due to national recommendations for restrictions regarding the COVID-19 Pandemic (e.g. social distancing) in an effort to limit this patient's exposure and mitigate transmission in our community.  Due to his co-morbid illnesses, this patient is at least at moderate risk for complications without adequate follow up.  This format is felt to be most appropriate for this patient at this time.  The patient did not have access to video technology/had technical difficulties with video requiring transitioning to audio format only (telephone).  All issues noted in this document were discussed and addressed.  No physical exam could be performed with this format.  Please refer to the patient's chart for his  consent to telehealth for [facility].   Date:  12/12/2019   ID:  Brandon [name], DOB November 26, 1970, MRN [id number]  Patient Location: Home Provider Location: Home  PCP:  Denita Lung, MD  Cardiologist: Dr. [name] Electrophysiologist:  None   Evaluation Performed:  Follow-Up Visit  Chief Complaint: Follow-up hyperlipidemia and carotid artery disease  History of Present Illness:    Brandon [name] is a 49 y.o.  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 He was initially referred by Dr. Redmond School because of lower extremity edema which has since resolved after changing jobs.  I last saw him in the office 11/28/2019.  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 His risk factors include 1-1/2 packs a day of tobacco abuse having smoked 35 years as well as treated hypertension and hyperlipidemia. His father had a heart attack at age 41 and died. 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 He was recathed here later found to have in-stent  restenosis as well as recently intervened on. I Him 02/24/10 the setting of a non-STEMI revealed revealing a new 75% in-stent restenosis within the RCA stent and a new 99% proximal lesion both of which were intervened on. He's had no problems since.  I have not seen him for close to 2 years.  He did run out of his statin drug for 6 months and had a lipid profile done a month ago revealing total cholesterol 276, LDL 194 and HDL of 37.  He apparently has been put back on a statin drug.  He denies chest pain or shortness of breath.  He does continue to smoke a pack a day however.  Since I saw him in the office 2 weeks ago he did have a follow-up lipid profile performed 12/04/2019 revealing a total cholesterol 215, LDL 146 and HDL 42 on atorvastatin 80 mg a day.  He is clearly not at goal for secondary prevention and we discussed starting Repatha.  In addition, he did have carotid Doppler studies because of an auscultated bruit that revealed a total left carotid with normal right carotid artery.  He is complaining of some right upper extremity numbness.  Is unclear whether this is radicular or neurologic from his carotid disease and he probably would benefit from seeing a neurologist.  We talked about risk factor modification including smoking cessation.  He still smokes 1 pack/day.  The patient does not have symptoms concerning for COVID-19 infection (fever, chills, cough, or new shortness of breath).    Past Medical History:  Diagnosis Date  Arteriosclerotic cardiovascular disease (ASCVD) 2006, 2012   2006-acute IMI treated with urgent RCA stent; 2007-Cutting Balloon for in-stent restenosis; 02/2010-presented with ACS and minimal troponin elevation:70% LAD, 80% distal circumflex, 80% proximal ramus branch vessel,in-stent restenosis of 70% in the RCA; BMS for proximal critical RCA stenosis, restenosis Nov 2012   Heart attack [facility])    History of noncompliance with medical treatment    Due to financial  considerations   Hyperlipidemia    Hypertension    Tobacco abuse    40 pack years   Past Surgical History:  Procedure Laterality Date   CORONARY ANGIOPLASTY WITH STENT PLACEMENT       Current Meds  Medication Sig   aspirin EC 81 MG tablet Take 81 mg by mouth daily.   atorvastatin (LIPITOR) 80 MG tablet Take 1 tablet (80 mg total) by mouth daily.   carvedilol (COREG) 25 MG tablet Take 1 tablet (25 mg total) by mouth 2 (two) times daily with a meal.   fluticasone (FLONASE) 50 MCG/ACT nasal spray Place into the nose.   oxyCODONE-acetaminophen (PERCOCET) 5-325 MG tablet Take 1 tablet by mouth every 4 (four) hours as needed for severe pain.   tamsulosin (FLOMAX) 0.4 MG CAPS capsule Take 1 capsule (0.4 mg total) by mouth daily after supper.   Current Facility-Administered Medications for the 12/12/19 encounter (Telemedicine) with Lorretta Harp, MD  Medication   Influenza (>/= 3 years) inactive virus vaccine (FLVIRIN/FLUZONE) injection SUSP 0.5 mL     Allergies:   Patient has no known allergies.   Social History   Tobacco Use   Smoking status: Current Every Day Smoker    Packs/day: 1.00    Years: 27.00    Pack years: 27.00    Types: Cigarettes   Smokeless tobacco: Never Used  Substance Use Topics   Alcohol use: No    Comment:  Alcoholism- quit 2002   Drug use: No     Family Hx: The patient's family history includes COPD in his maternal grandfather; Cancer in his paternal grandmother; Depression (age of onset: 60) in his mother; Heart disease in his paternal grandmother; Heart disease (age of onset: 61) in his father; Hyperlipidemia in his father; Hypertension in his father.  ROS:   Please see the history of present illness.     All other systems reviewed and are negative.   Prior CV studies:   The following studies were reviewed today:  Carotid Doppler study performed 12/10/2019  Labs/Other Tests and Data Reviewed:    EKG:  No ECG  reviewed.  Recent Labs: 10/31/2019: BUN 16; Creatinine, Ser 0.90; Hemoglobin 16.5; Platelets 172; Potassium 4.1; Sodium 143 12/04/2019: ALT 47   Recent Lipid Panel Lab Results  Component Value Date/Time   CHOL 215 (H) 12/04/2019 01:12 PM   TRIG 148 12/04/2019 01:12 PM   HDL 42 12/04/2019 01:12 PM   CHOLHDL 5.1 (H) 12/04/2019 01:12 PM   CHOLHDL 7.7 (H) 06/19/2017 10:04 AM   LDLCALC 146 (H) 12/04/2019 01:12 PM    Wt Readings from Last 3 Encounters:  12/12/19 290 lb (131.5 kg)  11/28/19 (!) 302 lb 6.4 oz (137.2 kg)  10/31/19 (!) 300 lb 12.8 oz (136.4 kg)     Objective:    Vital Signs:  Wt 290 lb (131.5 kg)    BMI 42.83 kg/m    VITAL SIGNS:  reviewed the patient did not check his vital signs at home.  A complete physical exam was not performed since this was a virtual telemedicine  phone visit  ASSESSMENT & PLAN:    1. Carotid artery disease-carotid Dopplers performed 12/10/2019 revealed total left carotid with normal right internal carotid artery.  The patient is on aspirin.  He is having some right upper extremity numbness symptoms which may be radicular or related to his carotid artery disease.  I have suggested that he have his PCP refer him to a neurologist for further evaluation. 2. Hyperlipidemia-history of hyperlipidemia on high-dose atorvastatin with recent lipid profile performed 12/04/2019 revealing total cholesterol 215, LDL of 146 and HDL 42.  I think he would be a good candidate for Repatha. 3. Tobacco abuse-continues to smoke 1 pack/day.  We talked about smoking cessation and risk factor modification 4. Coronary artery disease-history of CAD status post inferior wall myocardial infarction in 2006 treated with RCA stenting.  I last performed cardiac catheterization on him 02/24/2010 in the setting of non-STEMI revealing in-stent restenosis within the RCA a stent as well as a 99% new proximal lesion both of which I intervened on.  Has been asymptomatic since. 5. Morbid  obesity-BMI 45  COVID-19 Education: The signs and symptoms of COVID-19 were discussed with the patient and how to seek care for testing (follow up with PCP or arrange E-visit).  The importance of social distancing was discussed today.  Time:   Today, I have spent 10 minutes with the patient with telehealth technology discussing the above problems.     Medication Adjustments/Labs and Tests Ordered: Current medicines are reviewed at length with the patient today.  Concerns regarding medicines are outlined above.   Tests Ordered: No orders of the defined types were placed in this encounter.   Medication Changes: No orders of the defined types were placed in this encounter.   Follow Up:  In Person in 6 month(s)  Signed, [name], MD  12/12/2019 2:00 PM    [place]

## 2019-12-12 NOTE — Telephone Encounter (Signed)
New Message  Patient's wife is calling in to speak with Dr. Kennon Holter nurse. 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 Please assist.

## 2019-12-12 NOTE — Telephone Encounter (Signed)
Got permission from Dr. Gwenlyn Found to write letter for pt to be out of work. Faxed letter over. Pt aware.

## 2019-12-12 NOTE — Telephone Encounter (Signed)
Follow Up  Per patient, work note will need to be faxed to [phone] Attn: Enid Derry. Patient states that he is overwhelmed about diagnosis and needs to clear his mind before returning to work. Please assist.

## 2019-12-12 NOTE — Telephone Encounter (Signed)
pt's wife called to cancel today's appt due to her husband has other health problems that he needs to take care of.

## 2019-12-14 ENCOUNTER — Emergency Department (HOSPITAL_COMMUNITY): Payer: 59

## 2019-12-14 ENCOUNTER — Other Ambulatory Visit: Payer: Self-pay

## 2019-12-14 ENCOUNTER — Encounter (HOSPITAL_COMMUNITY): Payer: Self-pay | Admitting: Emergency Medicine

## 2019-12-14 ENCOUNTER — Inpatient Hospital Stay (HOSPITAL_COMMUNITY)
Admission: EM | Admit: 2019-12-14 | Discharge: 2019-12-17 | DRG: 068 | Disposition: A | Discharge status: Home / Self Care | Payer: 59 | Attending: Neurology | Admitting: Neurology

## 2019-12-14 DIAGNOSIS — I639 Cerebral infarction, unspecified: Secondary | ICD-10-CM | POA: Diagnosis not present

## 2019-12-14 DIAGNOSIS — I739 Peripheral vascular disease, unspecified: Secondary | ICD-10-CM | POA: Diagnosis present

## 2019-12-14 DIAGNOSIS — Z8249 Family history of ischemic heart disease and other diseases of the circulatory system: Secondary | ICD-10-CM

## 2019-12-14 DIAGNOSIS — I951 Orthostatic hypotension: Secondary | ICD-10-CM | POA: Diagnosis not present

## 2019-12-14 DIAGNOSIS — F17210 Nicotine dependence, cigarettes, uncomplicated: Secondary | ICD-10-CM | POA: Diagnosis present

## 2019-12-14 DIAGNOSIS — E785 Hyperlipidemia, unspecified: Secondary | ICD-10-CM | POA: Diagnosis present

## 2019-12-14 DIAGNOSIS — I252 Old myocardial infarction: Secondary | ICD-10-CM

## 2019-12-14 DIAGNOSIS — I6522 Occlusion and stenosis of left carotid artery: Secondary | ICD-10-CM | POA: Diagnosis not present

## 2019-12-14 DIAGNOSIS — G459 Transient cerebral ischemic attack, unspecified: Secondary | ICD-10-CM | POA: Diagnosis present

## 2019-12-14 DIAGNOSIS — Z8349 Family history of other endocrine, nutritional and metabolic diseases: Secondary | ICD-10-CM

## 2019-12-14 DIAGNOSIS — I10 Essential (primary) hypertension: Secondary | ICD-10-CM | POA: Diagnosis present

## 2019-12-14 DIAGNOSIS — Z6841 Body Mass Index (BMI) 40.0 and over, adult: Secondary | ICD-10-CM

## 2019-12-14 DIAGNOSIS — Z7982 Long term (current) use of aspirin: Secondary | ICD-10-CM

## 2019-12-14 DIAGNOSIS — G4730 Sleep apnea, unspecified: Secondary | ICD-10-CM | POA: Diagnosis present

## 2019-12-14 DIAGNOSIS — Z955 Presence of coronary angioplasty implant and graft: Secondary | ICD-10-CM

## 2019-12-14 DIAGNOSIS — I63232 Cerebral infarction due to unspecified occlusion or stenosis of left carotid arteries: Secondary | ICD-10-CM

## 2019-12-14 DIAGNOSIS — N400 Enlarged prostate without lower urinary tract symptoms: Secondary | ICD-10-CM

## 2019-12-14 DIAGNOSIS — Z79899 Other long term (current) drug therapy: Secondary | ICD-10-CM

## 2019-12-14 DIAGNOSIS — Z720 Tobacco use: Secondary | ICD-10-CM | POA: Diagnosis present

## 2019-12-14 DIAGNOSIS — I2510 Atherosclerotic heart disease of native coronary artery without angina pectoris: Secondary | ICD-10-CM | POA: Diagnosis present

## 2019-12-14 DIAGNOSIS — G4733 Obstructive sleep apnea (adult) (pediatric): Secondary | ICD-10-CM | POA: Diagnosis present

## 2019-12-14 DIAGNOSIS — G458 Other transient cerebral ischemic attacks and related syndromes: Secondary | ICD-10-CM

## 2019-12-14 DIAGNOSIS — Z20822 Contact with and (suspected) exposure to covid-19: Secondary | ICD-10-CM | POA: Diagnosis present

## 2019-12-14 LAB — DIFFERENTIAL
Abs Immature Granulocytes: 0.06 10*3/uL (ref 0.00–0.07)
Basophils Absolute: 0.1 10*3/uL (ref 0.0–0.1)
Basophils Relative: 1 %
Eosinophils Absolute: 0.4 10*3/uL (ref 0.0–0.5)
Eosinophils Relative: 4 %
Immature Granulocytes: 1 %
Lymphocytes Relative: 41 %
Lymphs Abs: 5 10*3/uL — ABNORMAL HIGH (ref 0.7–4.0)
Monocytes Absolute: 0.6 10*3/uL (ref 0.1–1.0)
Monocytes Relative: 5 %
Neutro Abs: 5.9 10*3/uL (ref 1.7–7.7)
Neutrophils Relative %: 48 %

## 2019-12-14 LAB — COMPREHENSIVE METABOLIC PANEL
ALT: 38 U/L (ref 0–44)
AST: 26 U/L (ref 15–41)
Albumin: 3.8 g/dL (ref 3.5–5.0)
Alkaline Phosphatase: 81 U/L (ref 38–126)
Anion gap: 9 (ref 5–15)
BUN: 13 mg/dL (ref 6–20)
CO2: 28 mmol/L (ref 22–32)
Calcium: 8.9 mg/dL (ref 8.9–10.3)
Chloride: 102 mmol/L (ref 98–111)
Creatinine, Ser: 0.89 mg/dL (ref 0.61–1.24)
GFR calc Af Amer: >60 mL/min (ref >60)
GFR calc non Af Amer: >60 mL/min (ref >60)
Glucose, Bld: 151 mg/dL — ABNORMAL HIGH (ref 70–99)
Potassium: 3.5 mmol/L (ref 3.5–5.1)
Sodium: 139 mmol/L (ref 135–145)
Total Bilirubin: 0.9 mg/dL (ref 0.3–1.2)
Total Protein: 7.1 g/dL (ref 6.5–8.1)

## 2019-12-14 LAB — CBC
HCT: 47.4 % (ref 39.0–52.0)
Hemoglobin: 16 g/dL (ref 13.0–17.0)
MCH: 31.7 pg (ref 26.0–34.0)
MCHC: 33.8 g/dL (ref 30.0–36.0)
MCV: 93.9 fL (ref 80.0–100.0)
Platelets: 198 10*3/uL (ref 150–400)
RBC: 5.05 MIL/uL (ref 4.22–5.81)
RDW: 13.1 % (ref 11.5–15.5)
WBC: 11.9 10*3/uL — ABNORMAL HIGH (ref 4.0–10.5)
nRBC: 0 % (ref 0.0–0.2)

## 2019-12-14 LAB — I-STAT CHEM 8, ED
BUN: 11 mg/dL (ref 6–20)
Calcium, Ion: 1.11 mmol/L — ABNORMAL LOW (ref 1.15–1.40)
Chloride: 104 mmol/L (ref 98–111)
Creatinine, Ser: 0.8 mg/dL (ref 0.61–1.24)
Glucose, Bld: 149 mg/dL — ABNORMAL HIGH (ref 70–99)
HCT: 46 % (ref 39.0–52.0)
Hemoglobin: 15.6 g/dL (ref 13.0–17.0)
Potassium: 3.4 mmol/L — ABNORMAL LOW (ref 3.5–5.1)
Sodium: 139 mmol/L (ref 135–145)
TCO2: 26 mmol/L (ref 22–32)

## 2019-12-14 LAB — PROTIME-INR
INR: 1 (ref 0.8–1.2)
Prothrombin Time: 12.7 seconds (ref 11.4–15.2)

## 2019-12-14 LAB — APTT: aPTT: 29 seconds (ref 24–36)

## 2019-12-14 LAB — ETHANOL: Alcohol, Ethyl (B): <10 mg/dL (ref <10)

## 2019-12-14 MED ORDER — IOHEXOL 350 MG/ML SOLN
150.0000 mL | Freq: Once | INTRAVENOUS | Status: AC | PRN
  Administered 2019-12-14: 150 mL via INTRAVENOUS

## 2019-12-14 NOTE — ED Notes (Signed)
CODE STROKE PAGED @ 2305

## 2019-12-14 NOTE — ED Triage Notes (Signed)
Pt brought in by [facility] after wife stated pt was having R sided weakness and numbness, slurred speech, and R sided facial drooping. Symptom onset noted to be @ 2130 with LKW @ 2100. Pt seen by EDP upon arrival and to CT @ 2307.

## 2019-12-14 NOTE — ED Provider Notes (Signed)
[facility] EMERGENCY DEPARTMENT Provider Note   CSN: [id number] Arrival date & time: 12/14/19  2310     History Chief Complaint  Patient presents with  . Code Stroke    Brandon [name] is a 49 y.o. male.  Patient is a 49 year old male with history of coronary artery disease with stent, peripheral vascular disease, hypertension, hyperlipidemia.  He presents today for evaluation of right-sided numbness.  This started at approximately 930 this evening.  He describes a numb sensation to his right arm and right leg as well as a right-sided facial droop and slurred speech.  EMS was called and the patient was transported here.  He arrives as a code stroke.  Patient denies headache or visual disturbances.  He denies other complaints.  The history is provided by the patient.       Past Medical History:  Diagnosis Date  . Arteriosclerotic cardiovascular disease (ASCVD) 2006, 2012   2006-acute IMI treated with urgent RCA stent; 2007-Cutting Balloon for in-stent restenosis; 02/2010-presented with ACS and minimal troponin elevation:70% LAD, 80% distal circumflex, 80% proximal ramus branch vessel,in-stent restenosis of 70% in the RCA; BMS for proximal critical RCA stenosis, restenosis Nov 2012  . Heart attack ([place])   . History of noncompliance with medical treatment    Due to financial considerations  . Hyperlipidemia   . Hypertension   . Tobacco abuse    40 pack years    Patient Active Problem List   Diagnosis Date Noted  . Penile pain 03/21/2019  . Ureteral stone 03/21/2019  . Dysuria 03/21/2019  . Abdominal pain 01/23/2019  . Genital warts 01/23/2019  . Screen for colon cancer 01/23/2019  . Rectal abnormality 01/23/2019  . History of renal calculi 01/22/2019  . Left lower quadrant abdominal pain 01/22/2019  . Hematuria 01/22/2019  . 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 Sleep apnea 12/29/2012  . Arteriosclerotic cardiovascular disease (ASCVD)   . Tobacco abuse     . History of noncompliance with medical treatment   . Hyperlipidemia 05/30/2011  . Hypertension 05/30/2011  . Obesity 05/30/2011    Past Surgical History:  Procedure Laterality Date  . CORONARY ANGIOPLASTY WITH STENT PLACEMENT         Family History  Problem Relation Age of Onset  . Depression Mother 50       Suicide  . Heart disease Father 74       Deceased from massive heart-attack  . Hyperlipidemia Father   . Hypertension Father   . COPD Maternal Grandfather   . Cancer Paternal Grandmother        Male Cancer  . Heart disease Paternal Grandmother     Social History   Tobacco Use  . Smoking status: Current Every Day Smoker    Packs/day: 1.00    Years: 27.00    Pack years: 27.00    Types: Cigarettes  . Smokeless tobacco: Never Used  Substance Use Topics  . Alcohol use: No    Comment:  Alcoholism- quit 2002  . Drug use: No    Home Medications Prior to Admission medications   Medication Sig Start Date End Date Taking? Authorizing Provider  aspirin EC 81 MG tablet Take 81 mg by mouth daily.    [provider]  atorvastatin (LIPITOR) 80 MG tablet Take 1 tablet (80 mg total) by mouth daily. 10/31/19   Denita Lung, MD  carvedilol (COREG) 25 MG tablet Take 1 tablet (25 mg total) by mouth 2 (two) times daily  with a meal. 10/31/19   Denita Lung, MD  fluticasone Asencion Islam) 50 MCG/ACT nasal spray Place into the nose. 12/17/17   [provider]  oxyCODONE-acetaminophen (PERCOCET) 5-325 MG tablet Take 1 tablet by mouth every 4 (four) hours as needed for severe pain. 03/20/19 03/19/20  Tysinger, Camelia Eng, PA-C  tamsulosin (FLOMAX) 0.4 MG CAPS capsule Take 1 capsule (0.4 mg total) by mouth daily after supper. 03/21/19   Tysinger, Camelia Eng, PA-C    Allergies    Patient has no known allergies.  Review of Systems   Review of Systems  All other systems reviewed and are negative.   Physical Exam Updated Vital Signs Wt 131.5 kg   BMI 42.81 kg/m    Physical Exam Vitals and nursing note reviewed.  Constitutional:      General: He is not in acute distress.    Appearance: He is well-developed. He is not diaphoretic.  HENT:     Head: Normocephalic and atraumatic.  Eyes:     Extraocular Movements: Extraocular movements intact.     Pupils: Pupils are equal, round, and reactive to light.  Cardiovascular:     Rate and Rhythm: Normal rate and regular rhythm.     Heart sounds: No murmur. No friction rub.  Pulmonary:     Effort: Pulmonary effort is normal. No respiratory distress.     Breath sounds: Normal breath sounds. No wheezing or rales.  Abdominal:     General: Bowel sounds are normal. There is no distension.     Palpations: Abdomen is soft.     Tenderness: There is no abdominal tenderness.  Musculoskeletal:        General: Normal range of motion.     Cervical back: Normal range of motion and neck supple.  Skin:    General: Skin is warm and dry.  Neurological:     General: No focal deficit present.     Mental Status: He is alert and oriented to person, place, and time.     Cranial Nerves: No cranial nerve deficit.     Sensory: No sensory deficit.     Motor: No weakness.     Coordination: Coordination normal.     Gait: Gait normal.     Comments: Speech does appear somewhat slurred.     ED Results / Procedures / Treatments   Labs (all labs ordered are listed, but only abnormal results are displayed) Labs Reviewed  ETHANOL  PROTIME-INR  APTT  CBC  DIFFERENTIAL  COMPREHENSIVE METABOLIC PANEL  RAPID URINE DRUG SCREEN, HOSP PERFORMED  URINALYSIS, ROUTINE W REFLEX MICROSCOPIC  I-STAT CHEM 8, ED    EKG EKG Interpretation  Date/Time:  Sunday December 14 2019 23:30:41 EST Ventricular Rate:  64 PR Interval:    QRS Duration: 108 QT Interval:  409 QTC Calculation: 422 R Axis:   40 Text Interpretation: Sinus rhythm Inferior infarct, age indeterminate No significant change since 09/12/2018 Confirmed by Veryl Speak [phone]) on 12/14/2019 11:59:52 PM   Radiology No results found.  Procedures Procedures (including critical care time)  Medications Ordered in ED Medications - No data to display  ED Course  I have reviewed the triage vital signs and the nursing notes.  Pertinent labs & imaging results that were available during my care of the patient were reviewed by me and considered in my medical decision making (see chart for details).    MDM Rules/Calculators/A&P  Patient is a 49 year old male brought by EMS for evaluation of right sided numbness,  right-sided facial droop, and slurred speech that began acutely at approximately 930 this evening.  Patient has known atherosclerosis of the left coronary artery.  Code stroke was called in the field and patient evaluated in the ED immediately upon arrival.  To my initial exam, patient was experiencing some slurred speech, a mild facial droop, and mainly subjective weakness to the right arm and leg.  He went immediately for a head CT which showed a likely early subacute infarct of the superior left parietal lobe.  He was then evaluated by teleneurology who has recommended additional studies including CT angio of the head and neck as well as CT perfusion scan of the brain.  The above additional studies were obtained showing occlusion of the left internal carotid artery at the bifurcation.  There was a 6 cc region of completed infarction at the left parietal vertex along with a 208 cc region potentially at risk.  These findings were discussed with both teleneurology and Dr. Cheral Marker who was on call at [facility].  Teleneurology does not feel as though systemic TPA is indicated secondary to the possibility of bleeding due to the subacute infarct noted on CT.  We have decided that emergent transport to [facility] is in the best interest of the patient.  He will be evaluated as a candidate for intervention by Interventional Radiology.  CRITICAL CARE Performed  by: Veryl Speak Total critical care time: 70 minutes Critical care time was exclusive of separately billable procedures and treating other patients. Critical care was necessary to treat or prevent imminent or life-threatening deterioration. Critical care was time spent personally by me on the following activities: development of treatment plan with patient and/or surrogate as well as nursing, discussions with consultants, evaluation of patient's response to treatment, examination of patient, obtaining history from patient or surrogate, ordering and performing treatments and interventions, ordering and review of laboratory studies, ordering and review of radiographic studies, pulse oximetry and re-evaluation of patient's condition.   Final Clinical Impression(s) / ED Diagnoses Final diagnoses:  None    Rx / DC Orders ED Discharge Orders    None       Veryl Speak, MD 12/15/19 [phone]

## 2019-12-14 NOTE — ED Notes (Signed)
Pt back to CT for Perfusion study.

## 2019-12-15 ENCOUNTER — Inpatient Hospital Stay (HOSPITAL_COMMUNITY): Payer: 59 | Admitting: Anesthesiology

## 2019-12-15 ENCOUNTER — Inpatient Hospital Stay (HOSPITAL_COMMUNITY): Payer: 59

## 2019-12-15 ENCOUNTER — Other Ambulatory Visit: Payer: Self-pay

## 2019-12-15 ENCOUNTER — Encounter (HOSPITAL_COMMUNITY)
Admission: EM | Disposition: A | Discharge status: Home / Self Care | Payer: Self-pay | Source: Home / Self Care | Attending: Neurology

## 2019-12-15 ENCOUNTER — Encounter (HOSPITAL_COMMUNITY): Payer: Self-pay | Admitting: Neurology

## 2019-12-15 ENCOUNTER — Emergency Department (HOSPITAL_COMMUNITY): Payer: 59

## 2019-12-15 DIAGNOSIS — Z955 Presence of coronary angioplasty implant and graft: Secondary | ICD-10-CM | POA: Diagnosis not present

## 2019-12-15 DIAGNOSIS — N400 Enlarged prostate without lower urinary tract symptoms: Secondary | ICD-10-CM | POA: Diagnosis present

## 2019-12-15 DIAGNOSIS — I6529 Occlusion and stenosis of unspecified carotid artery: Secondary | ICD-10-CM

## 2019-12-15 DIAGNOSIS — I639 Cerebral infarction, unspecified: Secondary | ICD-10-CM | POA: Diagnosis present

## 2019-12-15 DIAGNOSIS — I6522 Occlusion and stenosis of left carotid artery: Principal | ICD-10-CM

## 2019-12-15 DIAGNOSIS — I708 Atherosclerosis of other arteries: Secondary | ICD-10-CM | POA: Diagnosis not present

## 2019-12-15 DIAGNOSIS — Z8349 Family history of other endocrine, nutritional and metabolic diseases: Secondary | ICD-10-CM | POA: Diagnosis not present

## 2019-12-15 DIAGNOSIS — E7800 Pure hypercholesterolemia, unspecified: Secondary | ICD-10-CM | POA: Diagnosis not present

## 2019-12-15 DIAGNOSIS — G459 Transient cerebral ischemic attack, unspecified: Secondary | ICD-10-CM | POA: Diagnosis present

## 2019-12-15 DIAGNOSIS — E785 Hyperlipidemia, unspecified: Secondary | ICD-10-CM | POA: Diagnosis present

## 2019-12-15 DIAGNOSIS — I2510 Atherosclerotic heart disease of native coronary artery without angina pectoris: Secondary | ICD-10-CM | POA: Diagnosis present

## 2019-12-15 DIAGNOSIS — I10 Essential (primary) hypertension: Secondary | ICD-10-CM | POA: Diagnosis present

## 2019-12-15 DIAGNOSIS — I951 Orthostatic hypotension: Secondary | ICD-10-CM | POA: Diagnosis not present

## 2019-12-15 DIAGNOSIS — I63232 Cerebral infarction due to unspecified occlusion or stenosis of left carotid arteries: Secondary | ICD-10-CM | POA: Diagnosis not present

## 2019-12-15 DIAGNOSIS — I252 Old myocardial infarction: Secondary | ICD-10-CM | POA: Diagnosis not present

## 2019-12-15 DIAGNOSIS — Z6841 Body Mass Index (BMI) 40.0 and over, adult: Secondary | ICD-10-CM | POA: Diagnosis not present

## 2019-12-15 DIAGNOSIS — G4733 Obstructive sleep apnea (adult) (pediatric): Secondary | ICD-10-CM | POA: Diagnosis present

## 2019-12-15 DIAGNOSIS — Z79899 Other long term (current) drug therapy: Secondary | ICD-10-CM | POA: Diagnosis not present

## 2019-12-15 DIAGNOSIS — E782 Mixed hyperlipidemia: Secondary | ICD-10-CM | POA: Diagnosis not present

## 2019-12-15 DIAGNOSIS — G458 Other transient cerebral ischemic attacks and related syndromes: Secondary | ICD-10-CM | POA: Diagnosis not present

## 2019-12-15 DIAGNOSIS — F17210 Nicotine dependence, cigarettes, uncomplicated: Secondary | ICD-10-CM | POA: Diagnosis present

## 2019-12-15 DIAGNOSIS — Z7982 Long term (current) use of aspirin: Secondary | ICD-10-CM | POA: Diagnosis not present

## 2019-12-15 DIAGNOSIS — M79603 Pain in arm, unspecified: Secondary | ICD-10-CM

## 2019-12-15 DIAGNOSIS — I739 Peripheral vascular disease, unspecified: Secondary | ICD-10-CM | POA: Diagnosis present

## 2019-12-15 DIAGNOSIS — I6389 Other cerebral infarction: Secondary | ICD-10-CM

## 2019-12-15 DIAGNOSIS — Z20822 Contact with and (suspected) exposure to covid-19: Secondary | ICD-10-CM | POA: Diagnosis present

## 2019-12-15 DIAGNOSIS — F17200 Nicotine dependence, unspecified, uncomplicated: Secondary | ICD-10-CM

## 2019-12-15 DIAGNOSIS — Z8249 Family history of ischemic heart disease and other diseases of the circulatory system: Secondary | ICD-10-CM | POA: Diagnosis not present

## 2019-12-15 HISTORY — PX: IR ANGIOGRAM EXTREMITY LEFT: IMG651

## 2019-12-15 HISTORY — PX: IR ANGIO EXTERNAL CAROTID SEL EXT CAROTID BILAT MOD SED: IMG5372

## 2019-12-15 HISTORY — PX: RADIOLOGY WITH ANESTHESIA: SHX6223

## 2019-12-15 HISTORY — PX: IR CT HEAD LTD: IMG2386

## 2019-12-15 HISTORY — PX: IR PERCUTANEOUS ART THROMBECTOMY/INFUSION INTRACRANIAL INC DIAG ANGIO: IMG6087

## 2019-12-15 HISTORY — PX: IR ANGIO VERTEBRAL SEL VERTEBRAL UNI R MOD SED: IMG5368

## 2019-12-15 LAB — BASIC METABOLIC PANEL
Anion gap: 10 (ref 5–15)
BUN: 10 mg/dL (ref 6–20)
CO2: 25 mmol/L (ref 22–32)
Calcium: 8.8 mg/dL — ABNORMAL LOW (ref 8.9–10.3)
Chloride: 102 mmol/L (ref 98–111)
Creatinine, Ser: 0.94 mg/dL (ref 0.61–1.24)
GFR calc Af Amer: >60 mL/min (ref >60)
GFR calc non Af Amer: >60 mL/min (ref >60)
Glucose, Bld: 110 mg/dL — ABNORMAL HIGH (ref 70–99)
Potassium: 4 mmol/L (ref 3.5–5.1)
Sodium: 137 mmol/L (ref 135–145)

## 2019-12-15 LAB — CBC WITH DIFFERENTIAL/PLATELET
Abs Immature Granulocytes: 0.05 10*3/uL (ref 0.00–0.07)
Basophils Absolute: 0.2 10*3/uL — ABNORMAL HIGH (ref 0.0–0.1)
Basophils Relative: 2 %
Eosinophils Absolute: 0.4 10*3/uL (ref 0.0–0.5)
Eosinophils Relative: 4 %
HCT: 46.1 % (ref 39.0–52.0)
Hemoglobin: 16 g/dL (ref 13.0–17.0)
Immature Granulocytes: 1 %
Lymphocytes Relative: 41 %
Lymphs Abs: 4.3 10*3/uL — ABNORMAL HIGH (ref 0.7–4.0)
MCH: 31.9 pg (ref 26.0–34.0)
MCHC: 34.7 g/dL (ref 30.0–36.0)
MCV: 92 fL (ref 80.0–100.0)
Monocytes Absolute: 0.6 10*3/uL (ref 0.1–1.0)
Monocytes Relative: 5 %
Neutro Abs: 4.9 10*3/uL (ref 1.7–7.7)
Neutrophils Relative %: 47 %
Platelets: 184 10*3/uL (ref 150–400)
RBC: 5.01 MIL/uL (ref 4.22–5.81)
RDW: 13.1 % (ref 11.5–15.5)
WBC: 10.3 10*3/uL (ref 4.0–10.5)
nRBC: 0 % (ref 0.0–0.2)

## 2019-12-15 LAB — LIPID PANEL
Cholesterol: 155 mg/dL (ref 0–200)
HDL: 32 mg/dL — ABNORMAL LOW (ref >40)
LDL Cholesterol: 93 mg/dL (ref 0–99)
Total CHOL/HDL Ratio: 4.8 RATIO
Triglycerides: 152 mg/dL — ABNORMAL HIGH (ref <150)
VLDL: 30 mg/dL (ref 0–40)

## 2019-12-15 LAB — POCT ACTIVATED CLOTTING TIME: Activated Clotting Time: 197 seconds

## 2019-12-15 LAB — RESPIRATORY PANEL BY RT PCR (FLU A&B, COVID)
Influenza A by PCR: NEGATIVE
Influenza B by PCR: NEGATIVE
SARS Coronavirus 2 by RT PCR: NEGATIVE

## 2019-12-15 LAB — CK: Total CK: 73 U/L (ref 49–397)

## 2019-12-15 LAB — HEMOGLOBIN A1C
Hgb A1c MFr Bld: 6.1 % — ABNORMAL HIGH (ref 4.8–5.6)
Mean Plasma Glucose: 128.37 mg/dL

## 2019-12-15 LAB — ECHOCARDIOGRAM COMPLETE: Weight: 4638.48 oz

## 2019-12-15 LAB — MRSA PCR SCREENING: MRSA by PCR: NEGATIVE

## 2019-12-15 LAB — HIV ANTIBODY (ROUTINE TESTING W REFLEX): HIV Screen 4th Generation wRfx: NONREACTIVE

## 2019-12-15 SURGERY — IR WITH ANESTHESIA
Anesthesia: General

## 2019-12-15 MED ORDER — ATORVASTATIN CALCIUM 40 MG PO TABS: 40.0000 mg | ORAL_TABLET | Freq: Every day | ORAL | Status: DC

## 2019-12-15 MED ORDER — LIDOCAINE HCL 1 % IJ SOLN
INTRAMUSCULAR | Status: AC | PRN
  Administered 2019-12-15: 20 mL
  Administered 2019-12-15: 10 mL
  Administered 2019-12-15: 5 mL

## 2019-12-15 MED ORDER — ACETAMINOPHEN 160 MG/5ML PO SOLN: 650.0000 mg | ORAL | Status: DC | PRN

## 2019-12-15 MED ORDER — ASPIRIN EC 325 MG PO TBEC
325.0000 mg | DELAYED_RELEASE_TABLET | Freq: Every day | ORAL | Status: DC
  Administered 2019-12-16 – 2019-12-17 (×2): 325 mg via ORAL
  Filled 2019-12-15 (×4): qty 1

## 2019-12-15 MED ORDER — ACETAMINOPHEN 325 MG PO TABS
650.0000 mg | ORAL_TABLET | ORAL | Status: DC | PRN
  Administered 2019-12-15: 650 mg via ORAL
  Filled 2019-12-15: qty 2

## 2019-12-15 MED ORDER — SODIUM CHLORIDE 0.9 % IV SOLN: INTRAVENOUS | Status: DC

## 2019-12-15 MED ORDER — ATORVASTATIN CALCIUM 80 MG PO TABS
80.0000 mg | ORAL_TABLET | Freq: Every day | ORAL | Status: DC
  Administered 2019-12-15 – 2019-12-16 (×2): 80 mg via ORAL
  Filled 2019-12-15 (×2): qty 1

## 2019-12-15 MED ORDER — STROKE: EARLY STAGES OF RECOVERY BOOK
Freq: Once | Status: AC
  Filled 2019-12-15: qty 1

## 2019-12-15 MED ORDER — CLEVIDIPINE BUTYRATE 0.5 MG/ML IV EMUL: 0.0000 mg/h | INTRAVENOUS | Status: DC

## 2019-12-15 MED ORDER — MIDAZOLAM HCL 2 MG/2ML IJ SOLN
INTRAMUSCULAR | Status: AC | PRN
  Administered 2019-12-15: 1 mg via INTRAVENOUS

## 2019-12-15 MED ORDER — NOREPINEPHRINE 4 MG/250ML-% IV SOLN
0.0000 ug/min | INTRAVENOUS | Status: DC
  Administered 2019-12-15: 2 ug/min via INTRAVENOUS
  Administered 2019-12-15: 15 ug/min via INTRAVENOUS
  Administered 2019-12-16: 10 ug/min via INTRAVENOUS
  Filled 2019-12-15 (×2): qty 250

## 2019-12-15 MED ORDER — LIDOCAINE 2% (20 MG/ML) 5 ML SYRINGE
INTRAMUSCULAR | Status: DC | PRN
  Administered 2019-12-15: 60 mg via INTRAVENOUS

## 2019-12-15 MED ORDER — ACETAMINOPHEN 650 MG RE SUPP: 650.0000 mg | RECTAL | Status: DC | PRN

## 2019-12-15 MED ORDER — SUGAMMADEX SODIUM 200 MG/2ML IV SOLN
INTRAVENOUS | Status: DC | PRN
  Administered 2019-12-15: 200 mg via INTRAVENOUS

## 2019-12-15 MED ORDER — IOHEXOL 300 MG/ML  SOLN
150.0000 mL | Freq: Once | INTRAMUSCULAR | Status: AC | PRN
  Administered 2019-12-15: 45 mL via INTRA_ARTERIAL

## 2019-12-15 MED ORDER — ACETAMINOPHEN 325 MG PO TABS
650.0000 mg | ORAL_TABLET | ORAL | Status: DC | PRN
  Filled 2019-12-15: qty 2

## 2019-12-15 MED ORDER — ROCURONIUM BROMIDE 50 MG/5ML IV SOSY
PREFILLED_SYRINGE | INTRAVENOUS | Status: DC | PRN
  Administered 2019-12-15: 70 mg via INTRAVENOUS

## 2019-12-15 MED ORDER — EPTIFIBATIDE 20 MG/10ML IV SOLN
INTRAVENOUS | Status: AC
  Filled 2019-12-15: qty 10

## 2019-12-15 MED ORDER — ACETAMINOPHEN 325 MG PO TABS: 650.0000 mg | ORAL_TABLET | ORAL | Status: DC | PRN

## 2019-12-15 MED ORDER — IOHEXOL 300 MG/ML  SOLN
50.0000 mL | Freq: Once | INTRAMUSCULAR | Status: AC | PRN
  Administered 2019-12-15: 30 mL via INTRA_ARTERIAL

## 2019-12-15 MED ORDER — CLOPIDOGREL BISULFATE 75 MG PO TABS
75.0000 mg | ORAL_TABLET | Freq: Every day | ORAL | Status: DC
  Administered 2019-12-16 – 2019-12-17 (×2): 75 mg via ORAL
  Filled 2019-12-15 (×3): qty 1

## 2019-12-15 MED ORDER — HEPARIN SODIUM (PORCINE) 1000 UNIT/ML IJ SOLN
INTRAMUSCULAR | Status: DC | PRN
  Administered 2019-12-15: 2000 [IU] via INTRAVENOUS

## 2019-12-15 MED ORDER — TICAGRELOR 90 MG PO TABS
180.0000 mg | ORAL_TABLET | Freq: Once | ORAL | Status: AC
  Administered 2019-12-15: 180 mg via ORAL
  Filled 2019-12-15: qty 2

## 2019-12-15 MED ORDER — ACETAMINOPHEN 160 MG/5ML PO SOLN
650.0000 mg | ORAL | Status: DC | PRN
  Filled 2019-12-15: qty 20.3

## 2019-12-15 MED ORDER — SUCCINYLCHOLINE CHLORIDE 20 MG/ML IJ SOLN
INTRAMUSCULAR | Status: DC | PRN
  Administered 2019-12-15: 120 mg via INTRAVENOUS

## 2019-12-15 MED ORDER — FENTANYL CITRATE (PF) 100 MCG/2ML IJ SOLN
INTRAMUSCULAR | Status: AC | PRN
  Administered 2019-12-15 (×2): 25 ug via INTRAVENOUS
  Administered 2019-12-15: 50 ug via INTRAVENOUS

## 2019-12-15 MED ORDER — LACTATED RINGERS IV SOLN: INTRAVENOUS | Status: DC | PRN

## 2019-12-15 MED ORDER — TAMSULOSIN HCL 0.4 MG PO CAPS
0.4000 mg | ORAL_CAPSULE | Freq: Every day | ORAL | Status: DC
  Administered 2019-12-15 – 2019-12-16 (×2): 0.4 mg via ORAL
  Filled 2019-12-15 (×2): qty 1

## 2019-12-15 MED ORDER — PHENYLEPHRINE HCL (PRESSORS) 10 MG/ML IV SOLN
INTRAVENOUS | Status: DC | PRN
  Administered 2019-12-15: 120 ug via INTRAVENOUS

## 2019-12-15 MED ORDER — ASPIRIN 81 MG PO CHEW
81.0000 mg | CHEWABLE_TABLET | Freq: Once | ORAL | Status: AC
  Administered 2019-12-15: 81 mg via ORAL
  Filled 2019-12-15: qty 1

## 2019-12-15 MED ORDER — ALBUMIN HUMAN 5 % IV SOLN
12.5000 g | Freq: Four times a day (QID) | INTRAVENOUS | Status: AC
  Administered 2019-12-15 – 2019-12-16 (×4): 12.5 g via INTRAVENOUS
  Filled 2019-12-15 (×2): qty 500

## 2019-12-15 MED ORDER — NITROGLYCERIN 1 MG/10 ML FOR IR/CATH LAB
INTRA_ARTERIAL | Status: AC
  Filled 2019-12-15: qty 10

## 2019-12-15 MED ORDER — GLYCOPYRROLATE 0.2 MG/ML IJ SOLN
INTRAMUSCULAR | Status: DC | PRN
  Administered 2019-12-15: .2 mg via INTRAVENOUS
  Administered 2019-12-15 (×2): .1 mg via INTRAVENOUS

## 2019-12-15 MED ORDER — PROTAMINE SULFATE 10 MG/ML IV SOLN
INTRAVENOUS | Status: DC | PRN
  Administered 2019-12-15: 5 mg via INTRAVENOUS

## 2019-12-15 MED ORDER — ACETAMINOPHEN 650 MG RE SUPP
650.0000 mg | RECTAL | Status: DC | PRN
  Filled 2019-12-15: qty 1

## 2019-12-15 MED ORDER — CEFAZOLIN SODIUM-DEXTROSE 2-3 GM-%(50ML) IV SOLR
INTRAVENOUS | Status: DC | PRN
  Administered 2019-12-15: 2 g via INTRAVENOUS

## 2019-12-15 MED ORDER — CEFAZOLIN SODIUM-DEXTROSE 2-4 GM/100ML-% IV SOLN
INTRAVENOUS | Status: AC
  Filled 2019-12-15: qty 100

## 2019-12-15 MED ORDER — EPHEDRINE SULFATE 50 MG/ML IJ SOLN
INTRAMUSCULAR | Status: DC | PRN
  Administered 2019-12-15: 20 mg via INTRAVENOUS
  Administered 2019-12-15 (×2): 10 mg via INTRAVENOUS

## 2019-12-15 MED ORDER — PHENYLEPHRINE HCL-NACL 10-0.9 MG/250ML-% IV SOLN
25.0000 ug/min | INTRAVENOUS | Status: DC
  Administered 2019-12-15: 60 ug/min via INTRAVENOUS
  Administered 2019-12-15: 110 ug/min via INTRAVENOUS
  Filled 2019-12-15 (×2): qty 250

## 2019-12-15 MED ORDER — IOHEXOL 300 MG/ML  SOLN
150.0000 mL | Freq: Once | INTRAMUSCULAR | Status: AC | PRN
  Administered 2019-12-15: 65 mL via INTRA_ARTERIAL

## 2019-12-15 MED ORDER — CARVEDILOL 12.5 MG PO TABS
25.0000 mg | ORAL_TABLET | Freq: Two times a day (BID) | ORAL | Status: DC
  Filled 2019-12-15: qty 2
  Filled 2019-12-15: qty 1
  Filled 2019-12-15: qty 2
  Filled 2019-12-15: qty 1

## 2019-12-15 MED ORDER — EPTIFIBATIDE 20 MG/10ML IV SOLN
INTRAVENOUS | Status: DC | PRN
  Administered 2019-12-15 (×2): 1.5 mg via INTRAVENOUS

## 2019-12-15 MED ORDER — ENOXAPARIN SODIUM 40 MG/0.4ML ~~LOC~~ SOLN
40.0000 mg | SUBCUTANEOUS | Status: DC
  Administered 2019-12-15 – 2019-12-16 (×2): 40 mg via SUBCUTANEOUS
  Filled 2019-12-15 (×3): qty 0.4

## 2019-12-15 MED ORDER — SENNOSIDES-DOCUSATE SODIUM 8.6-50 MG PO TABS
1.0000 | ORAL_TABLET | Freq: Every evening | ORAL | Status: DC | PRN
  Filled 2019-12-15: qty 1

## 2019-12-15 MED ORDER — FENTANYL CITRATE (PF) 100 MCG/2ML IJ SOLN
INTRAMUSCULAR | Status: DC | PRN
  Administered 2019-12-15: 100 ug via INTRAVENOUS

## 2019-12-15 MED ORDER — ONDANSETRON HCL 4 MG/2ML IJ SOLN
INTRAMUSCULAR | Status: DC | PRN
  Administered 2019-12-15: 4 mg via INTRAVENOUS

## 2019-12-15 MED ORDER — SODIUM CHLORIDE 0.9 % IV SOLN: 250.0000 mL | INTRAVENOUS | Status: DC

## 2019-12-15 MED ORDER — CHLORHEXIDINE GLUCONATE CLOTH 2 % EX PADS
6.0000 | MEDICATED_PAD | Freq: Every day | CUTANEOUS | Status: DC
  Administered 2019-12-15 – 2019-12-17 (×3): 6 via TOPICAL

## 2019-12-15 MED ORDER — PHENYLEPHRINE HCL-NACL 10-0.9 MG/250ML-% IV SOLN
INTRAVENOUS | Status: DC | PRN
  Administered 2019-12-15: 50 ug/min via INTRAVENOUS

## 2019-12-15 MED ORDER — PROPOFOL 10 MG/ML IV BOLUS
INTRAVENOUS | Status: DC | PRN
  Administered 2019-12-15: 30 mg via INTRAVENOUS
  Administered 2019-12-15: 140 mg via INTRAVENOUS
  Administered 2019-12-15: 40 mg via INTRAVENOUS

## 2019-12-15 NOTE — H&P (Signed)
Admission H&P    Chief Complaint: Acute onset of right sided weakness, right facial droop, aphasia and dysarthria  HPI: Brandon [name] is an 49 y.o. male who initially presented to the [facility] via EMS after acute onset of dizziness, dysphasia ("could not get the words out"), dysarthria and RUE weakness with numbness at home. He endorses having had a stuttering course of symptoms starting 2 days ago. At [facility] a Teleneurology consult was ordered. 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 CTP showed a small focus of core infarction with a large penumbra. He was not deemed to be a tPA candidate by the teleneurologist due to the subacute infarction presenting an unacceptable risk of hemorrhage. He was sent to [facility] for possible thrombectomy.   LSN: [id number] tPA Given: No: See HPI.    Past Medical History:  Diagnosis Date  . Arteriosclerotic cardiovascular disease (ASCVD) 2006, 2012   2006-acute IMI treated with urgent RCA stent; 2007-[place] for in-stent restenosis; 02/2010-presented with ACS and minimal troponin elevation:70% LAD, 80% distal circumflex, 80% proximal ramus branch vessel,in-stent restenosis of 70% in the RCA; BMS for proximal critical RCA stenosis, restenosis Nov 2012  . Heart attack ([place])   . History of noncompliance with medical treatment    Due to financial considerations  . Hyperlipidemia   . Hypertension   . Tobacco abuse    40 pack years    Past Surgical History:  Procedure Laterality Date  . CORONARY ANGIOPLASTY WITH STENT PLACEMENT      Family History  Problem Relation Age of Onset  . Depression Mother 48       Suicide  . Heart disease Father 67       Deceased from massive heart-attack  . Hyperlipidemia Father   . Hypertension Father   . COPD Maternal Grandfather   . Cancer Paternal Grandmother        Male Cancer  . Heart disease Paternal Grandmother     Social History:  reports that he has been smoking cigarettes. He has a 27.00 pack-year smoking history. He has never used smokeless tobacco. He reports that he does not drink alcohol or use drugs.  Allergies: No Known Allergies  Facility-Administered Medications Prior to Admission  Medication Dose Route Frequency Provider Last Rate Last Admin  . Influenza (>/= 3 years) inactive virus vaccine (FLVIRIN/FLUZONE) injection SUSP 0.5 mL  0.5 mL Intramuscular Once Fayrene Helper, MD       Medications Prior to Admission  Medication Sig Dispense Refill  . aspirin EC 81 MG tablet Take 81 mg by mouth daily.    Marland Kitchen atorvastatin (LIPITOR) 80 MG tablet Take 1 tablet (80 mg total) by mouth daily. 90 tablet 3  . carvedilol (COREG) 25 MG tablet Take 1 tablet (25 mg total) by mouth 2 (two) times daily with a meal. 180 tablet 3  . fluticasone (FLONASE) 50 MCG/ACT nasal spray Place into the nose.    . oxyCODONE-acetaminophen (PERCOCET) 5-325 MG tablet Take 1 tablet by mouth every 4 (four) hours as needed for severe pain. 10 tablet 0  . tamsulosin (FLOMAX) 0.4 MG CAPS capsule Take 1 capsule (0.4 mg total) by mouth daily after supper. 30 capsule 1    ROS: As per HPI. Comprehensive ROS otherwise negative.   Physical Examination: Blood pressure (!) 169/90, pulse 67, temperature 98.4 F (36.9 C), temperature source Oral, resp. rate 16, weight 131.5 kg, SpO2  96 %.  HEENT-  [place]/AT  Lungs - Respirations unlabored Extremities - No edema  Neurologic Examination: Mental Status: Alert, fully oriented, thought content appropriate.  Speech fluent with intact naming, repetition and comprehension. Reading also normal. Picture identification normal. No hemineglect. Speech is dysarthric.  Cranial Nerves: II:  Visual fields intact with no extinction to DSS. PERRL.  III,IV, VI: EOMI without nystagmus. No ptosis.  V,VII: Smile symmetric. Facial temp sensation equal bilaterally VIII: hearing intact to voice IX,X: No  hypophonia XI: Symmetric XII: midline tongue extension  Motor: Right : Upper extremity   5/5    Left:     Upper extremity   5/5  Lower extremity   5/5     Lower extremity   5/5 Normal tone throughout; no atrophy noted No pronator drift.  Positive orbiting fingers test on the right.  Sensory: Temp and light touch intact throughout, bilaterally, including the hands.  Deep Tendon Reflexes:  1+ bilateral upper and lower extremities.  Plantars: Right: downgoing   Left: downgoing Cerebellar: No ataxia with FNF bilaterally. H-S normal bilaterally  Gait: Deferred  Results for orders placed or performed during the hospital encounter of 12/14/19 (from the past 48 hour(s))  Ethanol     Status: None   Collection Time: 12/14/19 11:21 PM  Result Value Ref Range   Alcohol, Ethyl (B) <10 <10 mg/dL    Comment: (NOTE) Lowest detectable limit for serum alcohol is 10 mg/dL. For medical purposes only. Performed at [facility], [address] [place], [place] [address]   Protime-INR     Status: None   Collection Time: 12/14/19 11:21 PM  Result Value Ref Range   Prothrombin Time 12.7 11.4 - 15.2 seconds   INR 1.0 0.8 - 1.2    Comment: (NOTE) INR goal varies based on device and disease states. Performed at [facility], [address] [place], [place] [address]   APTT     Status: None   Collection Time: 12/14/19 11:21 PM  Result Value Ref Range   aPTT 29 24 - 36 seconds    Comment: Performed at [facility], [address], [place], [place] [address]  CBC     Status: Abnormal   Collection Time: 12/14/19 11:21 PM  Result Value Ref Range   WBC 11.9 (H) 4.0 - 10.5 K/uL   RBC 5.05 4.22 - 5.81 MIL/uL   Hemoglobin 16.0 13.0 - 17.0 g/dL   HCT 47.4 39.0 - 52.0 %   MCV 93.9 80.0 - 100.0 fL   MCH 31.7 26.0 - 34.0 pg   MCHC 33.8 30.0 - 36.0 g/dL   RDW 13.1 11.5 - 15.5 %   Platelets 198 150 - 400 K/uL   nRBC 0.0 0.0 - 0.2 %    Comment: Performed at [facility], [address], [place], [place]  [address]  Differential     Status: Abnormal   Collection Time: 12/14/19 11:21 PM  Result Value Ref Range   Neutrophils Relative % 48 %   Neutro Abs 5.9 1.7 - 7.7 K/uL   Lymphocytes Relative 41 %   Lymphs Abs 5.0 (H) 0.7 - 4.0 K/uL   Monocytes Relative 5 %   Monocytes Absolute 0.6 0.1 - 1.0 K/uL   Eosinophils Relative 4 %   Eosinophils Absolute 0.4 0.0 - 0.5 K/uL   Basophils Relative 1 %   Basophils Absolute 0.1 0.0 - 0.1 K/uL   Immature Granulocytes 1 %   Abs Immature Granulocytes 0.06 0.00 - 0.07 K/uL  Abnormal Lymphocytes Present ABNRM     Comment: Performed at [facility], [address], [place], [place] [address]  Comprehensive metabolic panel     Status: Abnormal   Collection Time: 12/14/19 11:21 PM  Result Value Ref Range   Sodium 139 135 - 145 mmol/L   Potassium 3.5 3.5 - 5.1 mmol/L   Chloride 102 98 - 111 mmol/L   CO2 28 22 - 32 mmol/L   Glucose, Bld 151 (H) 70 - 99 mg/dL   BUN 13 6 - 20 mg/dL   Creatinine, Ser 0.89 0.61 - 1.24 mg/dL   Calcium 8.9 8.9 - 10.3 mg/dL   Total Protein 7.1 6.5 - 8.1 g/dL   Albumin 3.8 3.5 - 5.0 g/dL   AST 26 15 - 41 U/L   ALT 38 0 - 44 U/L   Alkaline Phosphatase 81 38 - 126 U/L   Total Bilirubin 0.9 0.3 - 1.2 mg/dL   GFR calc non Af Amer >60 >60 mL/min   GFR calc Af Amer >60 >60 mL/min   Anion gap 9 5 - 15    Comment: Performed at [facility], [address], [place],  [address]  I-stat chem 8, ED     Status: Abnormal   Collection Time: 12/14/19 11:40 PM  Result Value Ref Range   Sodium 139 135 - 145 mmol/L   Potassium 3.4 (L) 3.5 - 5.1 mmol/L   Chloride 104 98 - 111 mmol/L   BUN 11 6 - 20 mg/dL   Creatinine, Ser 0.80 0.61 - 1.24 mg/dL   Glucose, Bld 149 (H) 70 - 99 mg/dL   Calcium, Ion 1.11 (L) 1.15 - 1.40 mmol/L   TCO2 26 22 - 32 mmol/L   Hemoglobin 15.6 13.0 - 17.0 g/dL   HCT 46.0 39.0 - 52.0 %  Respiratory Panel by RT PCR (Flu A&B, Covid) - Nasopharyngeal Swab     Status: None   Collection Time: 12/15/19 12:50 AM    Specimen: Nasopharyngeal Swab  Result Value Ref Range   SARS Coronavirus 2 by RT PCR NEGATIVE NEGATIVE    Comment: (NOTE) SARS-CoV-2 target nucleic acids are NOT DETECTED. The SARS-CoV-2 RNA is generally detectable in upper respiratoy specimens during the acute phase of infection. The lowest concentration of SARS-CoV-2 viral copies this assay can detect is 131 copies/mL. A negative result does not preclude SARS-Cov-2 infection and should not be used as the sole basis for treatment or other patient management decisions. A negative result may occur with  improper specimen collection/handling, submission of specimen other than nasopharyngeal swab, presence of viral mutation(s) within the areas targeted by this assay, and inadequate number of viral copies (<131 copies/mL). A negative result must be combined with clinical observations, patient history, and epidemiological information. The expected result is Negative. Fact Sheet for Patients:  PinkCheek.be Fact Sheet for Healthcare Providers:  GravelBags.it This test is not yet ap proved or cleared by the [place] FDA and  has been authorized for detection and/or diagnosis of SARS-CoV-2 by FDA under an Emergency Use Authorization (EUA). This EUA will remain  in effect (meaning this test can be used) for the duration of the COVID-19 declaration under Section 564(b)(1) of the Act, 21 U.S.C. section 360bbb-3(b)(1), unless the authorization is terminated or revoked sooner.    Influenza A by PCR NEGATIVE NEGATIVE   Influenza B by PCR NEGATIVE NEGATIVE    Comment: (NOTE) The Xpert Xpress SARS-CoV-2/FLU/RSV assay is intended as an aid in  the diagnosis of influenza  from Nasopharyngeal swab specimens and  should not be used as a sole basis for treatment. Nasal washings and  aspirates are unacceptable for Xpert Xpress SARS-CoV-2/FLU/RSV  testing. Fact Sheet for  Patients: PinkCheek.be Fact Sheet for Healthcare Providers: GravelBags.it This test is not yet approved or cleared by the [place] FDA and  has been authorized for detection and/or diagnosis of SARS-CoV-2 by  FDA under an Emergency Use Authorization (EUA). This EUA will remain  in effect (meaning this test can be used) for the duration of the  Covid-19 declaration under Section 564(b)(1) of the Act, 21  U.S.C. section 360bbb-3(b)(1), unless the authorization is  terminated or revoked. Performed at [facility], [address] [place], [place] [address]    CT Angio Head W or [place] Contrast  Result Date: 12/15/2019 CLINICAL DATA:  Slurred speech.  Right-sided facial droop EXAM: CT ANGIOGRAPHY HEAD AND NECK CT PERFUSION BRAIN TECHNIQUE: Multidetector CT imaging of the head and neck was performed using the standard protocol during bolus administration of intravenous contrast. Multiplanar CT image reconstructions and MIPs were obtained to evaluate the vascular anatomy. Carotid stenosis measurements (when applicable) are obtained utilizing NASCET criteria, using the distal internal carotid diameter as the denominator. Multiphase CT imaging of the brain was performed following IV bolus contrast injection. Subsequent parametric perfusion maps were calculated using RAPID software. CONTRAST:  161mL OMNIPAQUE IOHEXOL 350 MG/ML SOLN COMPARISON:  Head CT earlier same day. FINDINGS: CTA NECK FINDINGS Aortic arch: Aortic atherosclerosis. Right carotid system: Common carotid artery widely patent to the bifurcation region. Soft and calcified plaque at the carotid bifurcation and ICA bulb. No measurable stenosis however. Cervical ICA widely patent beyond bulb. Left carotid system: Common carotid artery shows intimal thickening but is widely patent to the bifurcation. There is occlusion of the ICA at the bifurcation. No reconstituted flow in the cervical  region. ECA widely patent. Vertebral arteries: Right subclavian artery is widely patent. Right vertebral artery is widely patent through the cervical region. Left subclavian artery is occluded just beyond its origin, with reconstituted flow likely due to retrograde flow in the left vertebral artery. Skeleton: Negative Other neck: No mass or lymphadenopathy. Upper chest: Upper lungs are clear. Review of the MIP images confirms the above findings CTA HEAD FINDINGS Anterior circulation: Right internal carotid artery is patent through the skull base and siphon region. No antegrade flow in the left ICA at the skull base level. There is reconstituted flow in the siphon, probably with contributions from external to internal collaterals, patent anterior communicating artery and patent posterior communicating artery at least on the left. Right anterior and middle cerebral vessels appear widely patent. Left anterior and middle cerebral vessels show flow. No large or medium vessel occlusion is identified. Posterior circulation: Both vertebral arteries show flow at the foramen magnum with presumed retrograde flow on the left. Basilar artery appears normal. Superior cerebellar and posterior cerebral arteries appear normal. Venous sinuses: Patent and normal. Anatomic variants: None other significant. 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 Reconstituted flow in the left carotid siphon likely due to external to internal collaterals, patent anterior communicating artery and patent posterior communicating artery on the left. No large or medium vessel intracranial occlusion is identified. 6 cc region of completed infarction at the left  parietal vertex. 208 cc region of  T-max greater than 6 seconds throughout the left MCA territory due to the reconstituted flow described above. 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 These results were called by telephone at the time of interpretation on 12/15/2019 at 12:04 am to provider Veryl Speak , who verbally acknowledged these results. Electronically Signed   By: Nelson Chimes M.D.   On: 12/15/2019 00:08   CT Angio Neck W and/or Wo Contrast  Result Date: 12/15/2019 CLINICAL DATA:  Slurred speech.  Right-sided facial droop EXAM: CT ANGIOGRAPHY HEAD AND NECK CT PERFUSION BRAIN TECHNIQUE: Multidetector CT imaging of the head and neck was performed using the standard protocol during bolus administration of intravenous contrast. Multiplanar CT image reconstructions and MIPs were obtained to evaluate the vascular anatomy. Carotid stenosis measurements (when applicable) are obtained utilizing NASCET criteria, using the distal internal carotid diameter as the denominator. Multiphase CT imaging of the brain was performed following IV bolus contrast injection. Subsequent parametric perfusion maps were calculated using RAPID software. CONTRAST:  127mL OMNIPAQUE IOHEXOL 350 MG/ML SOLN COMPARISON:  Head CT earlier same day. FINDINGS: CTA NECK FINDINGS Aortic arch: Aortic atherosclerosis. Right carotid system: Common carotid artery widely patent to the bifurcation region. Soft and calcified plaque at the carotid bifurcation and ICA bulb. No measurable stenosis however. Cervical ICA widely patent beyond bulb. Left carotid system: Common carotid artery shows intimal thickening but is widely patent to the bifurcation. There is occlusion of the ICA at the bifurcation. No reconstituted flow in the cervical region. ECA widely patent. Vertebral arteries: Right subclavian artery is widely patent. Right vertebral  artery is widely patent through the cervical region. Left subclavian artery is occluded just beyond its origin, with reconstituted flow likely due to retrograde flow in the left vertebral artery. Skeleton: Negative Other neck: No mass or lymphadenopathy. Upper chest: Upper lungs are clear. Review of the MIP images confirms the above findings CTA HEAD FINDINGS Anterior circulation: Right internal carotid artery is patent through the skull base and siphon region. No antegrade flow in the left ICA at the skull base level. There is reconstituted flow in the siphon, probably with contributions from external to internal collaterals, patent anterior communicating artery and patent posterior communicating artery at least on the left. Right anterior and middle cerebral vessels appear widely patent. Left anterior and middle cerebral vessels show flow. No large or medium vessel occlusion is identified. Posterior circulation: Both vertebral arteries show flow at the foramen magnum with presumed retrograde flow on the left. Basilar artery appears normal. Superior cerebellar and posterior cerebral arteries appear normal. Venous sinuses: Patent and normal. Anatomic variants: None other significant. 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 Reconstituted flow in the left carotid siphon likely due to external to internal collaterals, patent anterior communicating artery and patent posterior communicating artery on the left. No large or medium vessel intracranial occlusion is identified. 6 cc region of completed infarction at the left parietal vertex. 208 cc region of T-max greater than 6 seconds throughout the left MCA territory due to the reconstituted flow described  above. 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 These results were called by telephone at the time of interpretation on 12/15/2019 at 12:04 am to provider Veryl Speak , who verbally acknowledged these results. Electronically Signed   By: Nelson Chimes M.D.   On: 12/15/2019 00:08   CT CEREBRAL PERFUSION W CONTRAST  Result Date: 12/15/2019 CLINICAL DATA:  Slurred speech.  Right-sided facial droop EXAM: CT ANGIOGRAPHY HEAD AND NECK CT PERFUSION BRAIN TECHNIQUE: Multidetector CT imaging of the head and neck was performed using the standard protocol during bolus administration of intravenous contrast. Multiplanar CT image reconstructions and MIPs were obtained to evaluate the vascular anatomy. Carotid stenosis measurements (when applicable) are obtained utilizing NASCET criteria, using the distal internal carotid diameter as the denominator. Multiphase CT imaging of the brain was performed following IV bolus contrast injection. Subsequent parametric perfusion maps were calculated using RAPID software. CONTRAST:  114mL OMNIPAQUE IOHEXOL 350 MG/ML SOLN COMPARISON:  Head CT earlier same day. FINDINGS: CTA NECK FINDINGS Aortic arch: Aortic atherosclerosis. Right carotid system: Common carotid artery widely patent to the bifurcation region. Soft and calcified plaque at the carotid bifurcation and ICA bulb. No measurable stenosis however. Cervical ICA widely patent beyond bulb. Left carotid system: Common carotid artery shows intimal thickening but is widely patent to the bifurcation. There is occlusion of the ICA at the bifurcation. No reconstituted flow in the cervical region. ECA widely patent. Vertebral arteries: Right subclavian artery is widely patent. Right vertebral artery is widely patent through the cervical region. Left subclavian artery is occluded just beyond its  origin, with reconstituted flow likely due to retrograde flow in the left vertebral artery. Skeleton: Negative Other neck: No mass or lymphadenopathy. Upper chest: Upper lungs are clear. Review of the MIP images confirms the above findings CTA HEAD FINDINGS Anterior circulation: Right internal carotid artery is patent through the skull base and siphon region. No antegrade flow in the left ICA at the skull base level. There is reconstituted flow in the siphon, probably with contributions from external to internal collaterals, patent anterior communicating artery and patent posterior communicating artery at least on the left. Right anterior and middle cerebral vessels appear widely patent. Left anterior and middle cerebral vessels show flow. No large or medium vessel occlusion is identified. Posterior circulation: Both vertebral arteries show flow at the foramen magnum with presumed retrograde flow on the left. Basilar artery appears normal. Superior cerebellar and posterior cerebral arteries appear normal. Venous sinuses: Patent and normal. Anatomic variants: None other significant. 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 Reconstituted flow in the left carotid siphon likely due to external to internal collaterals, patent anterior communicating artery and patent posterior communicating artery on the left. No large or medium vessel intracranial occlusion is identified. 6 cc region of completed infarction at the left parietal vertex. 208 cc region of T-max greater than 6 seconds throughout the left MCA territory due to the reconstituted flow described above. 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 These results were called by telephone at the time of interpretation on 12/15/2019 at 12:04 am to provider Veryl Speak , who verbally acknowledged these results. Electronically Signed  By: Nelson Chimes M.D.   On: 12/15/2019 00:08   CT HEAD CODE STROKE WO CONTRAST  Result Date: 12/14/2019 CLINICAL DATA:  Code stroke. Slurred speech and right-sided facial droop EXAM: CT HEAD WITHOUT CONTRAST TECHNIQUE: Contiguous axial images were obtained from the base of the skull through the vertex without intravenous contrast. COMPARISON:  None. FINDINGS: Brain: No acute hemorrhage. There is an area hypoattenuation extending to the cortex of the superior left parietal lobe. There is no midline shift or other mass effect. No hydrocephalus. Vascular: Mild bilateral carotid atherosclerosis. No hyperdense vessel. Skull: Normal. Negative for fracture or focal lesion. Sinuses/Orbits: No acute finding. Other: None. ASPECTS [facility] Stroke Program Early CT Score) - Ganglionic level infarction (caudate, lentiform nuclei, internal capsule, insula, M1-M3 cortex): 7 - Supraganglionic infarction (M4-M6 cortex): 2 Total score (0-10 with 10 being normal): 9 IMPRESSION: 1. No intracranial hemorrhage. 2. 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 3. ASPECTS is 9. These results were called by telephone at the time of interpretation on 12/14/2019 at 11:21 pm to provider Veryl Speak , who verbally acknowledged these results. Electronically Signed   By: Ulyses Jarred M.D.   On: 12/14/2019 23:22    Assessment: 49 y.o. male with acute onset of aphasia, dysarthria, right facial droop and right sided weakness secondary to left ICA occlusion. Most likely the occlusion is acute. The patient did not receive tPA at OSH due to evidence of a recent completed infarction in the left parietal lobe - this is consistent with his history of initial  symptoms 2 days ago that then completely resolved. A 4-vessel cerebral angiogram performed at [facility] confirmed the CTA findings, but patient was deemed not to be a candidate for thrombectomy by Dr. Estanislado Pandy, in the context of good collateral flow and clinical improvement to minor deficits.  1. Exam reveals dysarthria and subtle UMN deficit of RUE (positive orbiting fingers test).  2. CTA reveals occlusion of left ICA with reconstituted flow in the left carotid siphon likely due to external to internal collaterals, patent anterior communicating artery and patent posterior communicating artery on the left. No large or medium vessel intracranial occlusion is identified. 3. Also seen on CTA is a proximal left subclavian artery occlusion. 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 4. CT perfusion reveals a 6 cc region of completed infarction at the left parietal vertex. 208 cc region of T-max greater than 6 seconds throughout the left MCA territory due to the reconstituted flow described above. 5. Stroke Risk Factors - Obesity, HTN, CAD, HLD and tobacco use 6. The patient has consented to catheter based angiogram with Dr. Estanislado Pandy for further assessment of the occlusion and reconstituted flow characteristics.   Plan: 1. Following VIR, admitting to the Neuro ICU under the Neurology service. Post-angiogram BP management with clevidipine. 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  2. MRI of the brain without contrast 3. PT consult, OT consult, Speech consult 4. Echocardiogram 5. Prophylactic therapy- Starting DAPT with addition of Plavix to ASA. Increasing home ASA dose from 81 to 325 mg.  6. Risk factor modification 7. Telemetry monitoring 8. HgbA1c, fasting lipid panel 9. Continue atorvastatin 80 mg po qd. Obtaining baseline CK level.  70 minutes spent  in the emergent neurological evaluation and management of this critically ill patient.   Addendum: After arrival to [facility], the patient exhibited  clinical worsening with recurent right hand and arm numbness, transientt RUE weakness, recurrent aphasia. STAT CT head showed no acute change. SBP was in the 120's and IVF was started. Repeat neurological exam after CT was significantly worse relative to the initial exam documented above, with new onset of dysphasia (reading deficit, comprehension deficit, naming deficit and halting speech). The patient's worsening was concerning for distal embolization or extension of the ICA thrombosis. He did not improve with increased SBP to 130's with IVF. Benefits/risks of second angiogram, with probable clot retraction procedure, were discussed with the patient and his wife, including 50% chance of significant improvement with approximately 10% chance of worsening due to possible complication of subarachnoid hemorrhage, with death as a possible outcome. The patient and his wife verbalized understanding and agreement to proceed with VIR. Consent form signed.   40 additional minutes of ICU time spent in the emergent re-evaluation of the patient. Time spent included coordination of care.    Electronically signed: Dr. Kerney Elbe 12/15/2019, 3:00 AM

## 2019-12-15 NOTE — Progress Notes (Signed)
  Echocardiogram 2D Echocardiogram has been performed.  Brandon [name] 12/15/2019, 10:20 AM

## 2019-12-15 NOTE — Progress Notes (Addendum)
Code IR called- patient given Brilinta and Aspirin before being transported to IR with consent form.

## 2019-12-15 NOTE — Progress Notes (Signed)
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 Estanislado Pandy; with worsening of symptoms following exam s/p diagnostic cerebral arteriogram with unsuccessful revascularization of left ICA secondary to consolidated left ICA clot 12/15/2019 by Dr. Estanislado Pandy. Patient awake and alert laying in bed with no complaints at this time. Accompanied by wife at bedside. Follows simple commands. Can spontaneously move all extremities. Right groin incision c/d/i.   Allergies: Patient has no known allergies.  Medications: Prior to Admission medications   Medication Sig Start Date End Date Taking? Authorizing Provider  aspirin EC 81 MG tablet Take 81 mg by mouth daily.    [provider]  atorvastatin (LIPITOR) 80 MG tablet Take 1 tablet (80 mg total) by mouth daily. 10/31/19   Denita Lung, MD  carvedilol (COREG) 25 MG tablet Take 1 tablet (25 mg total) by mouth 2 (two) times daily with a meal. 10/31/19   Denita Lung, MD  fluticasone [facility]) 50 MCG/ACT nasal spray Place into the nose. 12/17/17   [provider]  oxyCODONE-acetaminophen (PERCOCET) 5-325 MG tablet Take 1 tablet by mouth every 4 (four) hours as needed for severe pain. 03/20/19 03/19/20  Tysinger, Camelia Eng, PA-C  tamsulosin (FLOMAX) 0.4 MG CAPS capsule Take 1 capsule (0.4 mg total) by mouth daily after supper. 03/21/19   Tysinger, Camelia Eng, PA-C     Vital Signs: BP (!) 168/91   Pulse 63   Temp 98.3 F (36.8 C)   Resp 15   Wt 289 lb 14.5 oz (131.5 kg)   SpO2 96%   BMI 42.81 kg/m    Physical Exam Vitals and nursing note reviewed.  Constitutional:      General: He is not in acute distress. Pulmonary:     Effort: Pulmonary effort is normal. No respiratory distress.  Skin:    General: Skin is warm and dry.     Comments: Right groin incision soft without active bleeding or hematoma.  Neurological:     Mental Status: He is alert.     Comments: Alert, awake, and oriented x3. Speech and comprehension intact. PERRL bilaterally. Can spontaneously move all extremities. No pronator drift. Distal pulses palpable bilaterally with Doppler (DPs).     Imaging: CT Angio Head W or Wo Contrast  Result Date: 12/15/2019 CLINICAL DATA:  Slurred speech.  Right-sided facial droop EXAM: CT ANGIOGRAPHY HEAD AND NECK CT PERFUSION BRAIN TECHNIQUE: Multidetector CT imaging of the head and neck was performed using the standard protocol during bolus administration of intravenous contrast. Multiplanar CT image reconstructions and MIPs were obtained to evaluate the vascular anatomy. Carotid stenosis measurements (when applicable) are obtained utilizing NASCET criteria, using the distal internal carotid diameter as the denominator. Multiphase CT imaging of the brain was performed following IV bolus contrast injection. Subsequent parametric perfusion maps were calculated using RAPID software. CONTRAST:  174mL OMNIPAQUE IOHEXOL 350 MG/ML SOLN COMPARISON:  Head CT earlier same day. FINDINGS: CTA NECK FINDINGS Aortic arch: Aortic atherosclerosis. Right carotid system: Common carotid artery widely patent to the bifurcation region. Soft and calcified plaque at the carotid bifurcation and ICA bulb. No measurable stenosis however. Cervical ICA  widely patent beyond bulb. Left carotid system: Common carotid artery shows intimal thickening but is widely patent to the bifurcation. There is occlusion of the ICA at the bifurcation. No reconstituted flow in the cervical region. ECA widely patent. Vertebral arteries:  Right subclavian artery is widely patent. Right vertebral artery is widely patent through the cervical region. Left subclavian artery is occluded just beyond its origin, with reconstituted flow likely due to retrograde flow in the left vertebral artery. Skeleton: Negative Other neck: No mass or lymphadenopathy. Upper chest: Upper lungs are clear. Review of the MIP images confirms the above findings CTA HEAD FINDINGS Anterior circulation: Right internal carotid artery is patent through the skull base and siphon region. No antegrade flow in the left ICA at the skull base level. There is reconstituted flow in the siphon, probably with contributions from external to internal collaterals, patent anterior communicating artery and patent posterior communicating artery at least on the left. Right anterior and middle cerebral vessels appear widely patent. Left anterior and middle cerebral vessels show flow. No large or medium vessel occlusion is identified. Posterior circulation: Both vertebral arteries show flow at the foramen magnum with presumed retrograde flow on the left. Basilar artery appears normal. Superior cerebellar and posterior cerebral arteries appear normal. Venous sinuses: Patent and normal. Anatomic variants: None other significant. Review of the MIP images confirms the above findings CT Brain Perfusion Findings: ASPECTS: 9 CBF (<30%) Volume: 74mL Perfusion (Tmax>6.0s) volume: 254mL Mismatch Volume: 247mL Infarction Location:Left parietal vertex as shown by CT IMPRESSION: Occlusion of the left internal carotid artery at the bifurcation. Reconstituted flow in the left carotid siphon likely due to external to internal collaterals, patent anterior communicating artery and patent posterior communicating artery on the left. No large or medium vessel intracranial occlusion is identified. 6 cc region of completed infarction at the left parietal vertex. 208 cc region of T-max greater than 6 seconds throughout the  left MCA territory due to the reconstituted flow described above. 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 These results were called by telephone at the time of interpretation on 12/15/2019 at 12:04 am to provider Veryl Speak , who verbally acknowledged these results. Electronically Signed   By: Nelson Chimes M.D.   On: 12/15/2019 00:08   CT HEAD WO CONTRAST  Result Date: 12/15/2019 CLINICAL DATA:  Subacute neuro deficit EXAM: CT HEAD WITHOUT CONTRAST TECHNIQUE: Contiguous axial images were obtained from the base of the skull through the vertex without intravenous contrast. COMPARISON:  Yesterday FINDINGS: Brain: 2 small areas of cytotoxic edema along the left parietal and superior frontal convexities, mildly progressed from before. No hemorrhage, hydrocephalus, or midline shift. Vascular: There is still circulating intravascular contrast. No vessel asymmetry. 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 No hemorrhagic conversion. Electronically Signed   By: Monte Fantasia M.D.   On: 12/15/2019 04:52   CT Angio Neck W and/or Wo Contrast  Result Date: 12/15/2019 CLINICAL DATA:  Slurred speech.  Right-sided facial droop EXAM: CT ANGIOGRAPHY HEAD AND NECK CT PERFUSION BRAIN TECHNIQUE: Multidetector CT imaging of the head and neck was performed using the standard protocol during bolus administration of intravenous contrast. Multiplanar CT image reconstructions and MIPs were obtained to evaluate the vascular anatomy. Carotid stenosis measurements (when applicable) are obtained utilizing NASCET criteria, using the distal internal carotid diameter as the denominator.  Multiphase CT imaging of the brain was performed following IV bolus contrast injection. Subsequent parametric  perfusion maps were calculated using RAPID software. CONTRAST:  172mL OMNIPAQUE IOHEXOL 350 MG/ML SOLN COMPARISON:  Head CT earlier same day. FINDINGS: CTA NECK FINDINGS Aortic arch: Aortic atherosclerosis. Right carotid system: Common carotid artery widely patent to the bifurcation region. Soft and calcified plaque at the carotid bifurcation and ICA bulb. No measurable stenosis however. Cervical ICA widely patent beyond bulb. Left carotid system: Common carotid artery shows intimal thickening but is widely patent to the bifurcation. There is occlusion of the ICA at the bifurcation. No reconstituted flow in the cervical region. ECA widely patent. Vertebral arteries: Right subclavian artery is widely patent. Right vertebral artery is widely patent through the cervical region. Left subclavian artery is occluded just beyond its origin, with reconstituted flow likely due to retrograde flow in the left vertebral artery. Skeleton: Negative Other neck: No mass or lymphadenopathy. Upper chest: Upper lungs are clear. Review of the MIP images confirms the above findings CTA HEAD FINDINGS Anterior circulation: Right internal carotid artery is patent through the skull base and siphon region. No antegrade flow in the left ICA at the skull base level. There is reconstituted flow in the siphon, probably with contributions from external to internal collaterals, patent anterior communicating artery and patent posterior communicating artery at least on the left. Right anterior and middle cerebral vessels appear widely patent. Left anterior and middle cerebral vessels show flow. No large or medium vessel occlusion is identified. Posterior circulation: Both vertebral arteries show flow at the foramen magnum with presumed retrograde flow on the left. Basilar artery appears normal. Superior cerebellar and posterior cerebral arteries appear normal. Venous sinuses: Patent and normal. Anatomic variants: None other significant. Review of the  MIP images confirms the above findings CT Brain Perfusion Findings: ASPECTS: 9 CBF (<30%) Volume: 7mL Perfusion (Tmax>6.0s) volume: 237mL Mismatch Volume: 271mL Infarction Location:Left parietal vertex as shown by CT IMPRESSION: Occlusion of the left internal carotid artery at the bifurcation. Reconstituted flow in the left carotid siphon likely due to external to internal collaterals, patent anterior communicating artery and patent posterior communicating artery on the left. No large or medium vessel intracranial occlusion is identified. 6 cc region of completed infarction at the left parietal vertex. 208 cc region of T-max greater than 6 seconds throughout the left MCA territory due to the reconstituted flow described above. 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 These results were called by telephone at the time of interpretation on 12/15/2019 at 12:04 am to provider Veryl Speak , who verbally acknowledged these results. Electronically Signed   By: Nelson Chimes M.D.   On: 12/15/2019 00:08   CT CEREBRAL PERFUSION W CONTRAST  Result Date: 12/15/2019 CLINICAL DATA:  Slurred speech.  Right-sided facial droop EXAM: CT ANGIOGRAPHY HEAD AND NECK CT PERFUSION BRAIN TECHNIQUE: Multidetector CT imaging of the head and neck was performed using the standard protocol during bolus administration of intravenous contrast. Multiplanar CT image reconstructions and MIPs were obtained to evaluate the vascular anatomy. Carotid stenosis measurements (when applicable) are obtained utilizing NASCET criteria, using the distal internal carotid diameter as the denominator. Multiphase CT imaging of the brain was performed following IV bolus contrast injection. Subsequent parametric perfusion maps were calculated using RAPID software. CONTRAST:  17mL OMNIPAQUE IOHEXOL 350 MG/ML SOLN  COMPARISON:  Head CT earlier same day. FINDINGS: CTA  NECK FINDINGS Aortic arch: Aortic atherosclerosis. Right carotid system: Common carotid artery widely patent to the bifurcation region. Soft and calcified plaque at the carotid bifurcation and ICA bulb. No measurable stenosis however. Cervical ICA widely patent beyond bulb. Left carotid system: Common carotid artery shows intimal thickening but is widely patent to the bifurcation. There is occlusion of the ICA at the bifurcation. No reconstituted flow in the cervical region. ECA widely patent. Vertebral arteries: Right subclavian artery is widely patent. Right vertebral artery is widely patent through the cervical region. Left subclavian artery is occluded just beyond its origin, with reconstituted flow likely due to retrograde flow in the left vertebral artery. Skeleton: Negative Other neck: No mass or lymphadenopathy. Upper chest: Upper lungs are clear. Review of the MIP images confirms the above findings CTA HEAD FINDINGS Anterior circulation: Right internal carotid artery is patent through the skull base and siphon region. No antegrade flow in the left ICA at the skull base level. There is reconstituted flow in the siphon, probably with contributions from external to internal collaterals, patent anterior communicating artery and patent posterior communicating artery at least on the left. Right anterior and middle cerebral vessels appear widely patent. Left anterior and middle cerebral vessels show flow. No large or medium vessel occlusion is identified. Posterior circulation: Both vertebral arteries show flow at the foramen magnum with presumed retrograde flow on the left. Basilar artery appears normal. Superior cerebellar and posterior cerebral arteries appear normal. Venous sinuses: Patent and normal. Anatomic variants: None other significant. Review of the MIP images confirms the above findings CT Brain Perfusion Findings: ASPECTS: 9 CBF (<30%) Volume: 26mL  Perfusion (Tmax>6.0s) volume: 29mL Mismatch Volume: 241mL Infarction Location:Left parietal vertex as shown by CT IMPRESSION: Occlusion of the left internal carotid artery at the bifurcation. Reconstituted flow in the left carotid siphon likely due to external to internal collaterals, patent anterior communicating artery and patent posterior communicating artery on the left. No large or medium vessel intracranial occlusion is identified. 6 cc region of completed infarction at the left parietal vertex. 208 cc region of T-max greater than 6 seconds throughout the left MCA territory due to the reconstituted flow described above. 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 These results were called by telephone at the time of interpretation on 12/15/2019 at 12:04 am to provider Veryl Speak , who verbally acknowledged these results. Electronically Signed   By: Nelson Chimes M.D.   On: 12/15/2019 00:08   DG CHEST PORT 1 VIEW  Result Date: 12/15/2019 CLINICAL DATA:  Stroke, hypertension, MI EXAM: PORTABLE CHEST 1 VIEW COMPARISON:  Radiograph 09/12/2018 FINDINGS: Lung volumes are low with hazy interstitial opacities and cephalized, indistinct pulmonary vascularity. Cardiac silhouette is markedly enlarged from comparison exams. No visible pneumothorax or effusion. No focal consolidative opacity. No acute osseous or soft tissue abnormality. Telemetry leads overlie the chest. IMPRESSION: 1. Markedly enlarged cardiac silhouette from comparison exams. Could reflect cardiomegaly or pericardial effusion. 2. Additional features of likely vascular congestion and interstitial edema on a background of low volumes and atelectasis. Electronically Signed   By: Lovena Le M.D.   On: 12/15/2019 06:26   CT HEAD CODE STROKE WO CONTRAST  Result Date: 12/14/2019 CLINICAL DATA:  Code stroke.  Slurred speech and right-sided facial droop EXAM: CT HEAD WITHOUT CONTRAST TECHNIQUE: Contiguous axial images were obtained from the base of the skull through the vertex without intravenous  contrast. COMPARISON:  None. FINDINGS: Brain: No acute hemorrhage. There is an area hypoattenuation extending to the cortex of the superior left parietal lobe. There is no midline shift or other mass effect. No hydrocephalus. Vascular: Mild bilateral carotid atherosclerosis. No hyperdense vessel. Skull: Normal. Negative for fracture or focal lesion. Sinuses/Orbits: No acute finding. Other: None. ASPECTS [facility] Stroke Program Early CT Score) - Ganglionic level infarction (caudate, lentiform nuclei, internal capsule, insula, M1-M3 cortex): 7 - Supraganglionic infarction (M4-M6 cortex): 2 Total score (0-10 with 10 being normal): 9 IMPRESSION: 1. No intracranial hemorrhage. 2. 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 3. ASPECTS is 9. These results were called by telephone at the time of interpretation on 12/14/2019 at 11:21 pm to provider Veryl Speak , who verbally acknowledged these results. Electronically Signed   By: Ulyses Jarred M.D.   On: 12/14/2019 23:22    Labs:  CBC: Recent Labs    01/23/19 1230 01/23/19 1230 10/31/19 1432 12/14/19 2321 12/14/19 2340 12/15/19 0357  WBC 7.6  --  9.3 11.9*  --  10.3  HGB 16.8   < > 16.5 16.0 15.6 16.0  HCT 49.9   < > 47.5 47.4 46.0 46.1  PLT 189  --  172 198  --  184   < > = values in this interval not displayed.    COAGS: Recent Labs    12/14/19 2321  INR 1.0  APTT 29    BMP: Recent Labs    01/23/19 1230 01/23/19 1230 10/31/19 1432 12/14/19 2321 12/14/19 2340 12/15/19 0357  NA 143   < > 143 139 139 137  K 4.4   < > 4.1 3.5 3.4* 4.0  CL 104   < > 101 102 104 102  CO2 24  --  26 28  --  25  GLUCOSE 117*   < > 101* 151* 149* 110*  BUN 11   < > 16 13 11 10   CALCIUM 9.4  --  9.6 8.9  --  8.8*  CREATININE 0.98   < > 0.90 0.89  0.80 0.94  GFRNONAA 90  --  99 >60  --  >60  GFRAA 103  --  115 >60  --  >60   < > = values in this interval not displayed.    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 Estanislado Pandy; with worsening of symptoms following exam s/p diagnostic cerebral arteriogram with unsuccessful revascularization of left ICA secondary to consolidated left ICA clot 12/15/2019 by Dr. Estanislado Pandy. Patient's condition stable- follows simple commands, can spontaneously move all extremities. Right groin incision stable, distal pulses palpable bilaterally with Doppler (DPs). Further plans per neurology- appreciate and agree with management. Please call NIR with questions/concerns.   Electronically Signed: Earley Abide, PA-C 12/15/2019, 9:18 AM   I spent a total of 25 Minutes at the the patient's bedside AND on the patient's hospital floor or unit, greater  than 50% of which was counseling/coordinating care for left ICA occlusion.

## 2019-12-15 NOTE — Progress Notes (Signed)
PT Cancellation Note  Patient Details Name: WILLS ELMENDORF MRN: [id number] DOB: 1971/06/07   Cancelled Treatment:    Reason Eval/Treat Not Completed: Medical issues which prohibited therapy; s/p revascularization attempt, on bedrest, will attempt another day.    Reginia Naas 12/15/2019, 9:02 AM  Magda Kiel, [place] [phone] 12/15/2019

## 2019-12-15 NOTE — Sedation Documentation (Signed)
Groin and pulses assessed at bedside with Abby, RN. No change, see flowsheet. All questions answered to satisfaction.

## 2019-12-15 NOTE — Progress Notes (Signed)
OT Cancellation Note  Patient Details Name: Brandon [name] MRN: [id number] DOB: February 05, 1971   Cancelled Treatment:    Reason Eval/Treat Not Completed: Patient not medically ready.  Nilsa Nutting., OTR/L Acute Rehabilitation Services Pager [phone] Office [phone]   Lucille Passy M 12/15/2019, 6:35 AM

## 2019-12-15 NOTE — Consult Note (Signed)
[facility] Consult Services   Date of Service:   12/14/2019 23:10:06  Impression:     .  I63.9 - Cerebrovascular accident (CVA), unspecified mechanism ([place])  Comments/Sign-Out: 49yo man w/PMH of HTN, HLD, sleep apnea, left carotid artery occlusion p/w right sided numbness, aphasia on 12/14/19. He started to feel "funny" at 21:30 and laid down to take a nap but noticed he started to have trouble speaking as well so he called EMS. He reports his speech has improved since arrival but the numbness is still present. He denies weakness, vision changes, dysphagia, dizziness, imbalance, LOC, headache, N/V, recent illness. NIHSS 1 for right arm/leg sensory loss, otherwise intact. CT Head shows left parietal lobe subacute infarct as per radiology, distinct borders noted which indicates it is likely already at least several hours old. Pt is not a candidate for alteplase due to lack of disabling deficits and subacute infarct on CT Head that is likely >4.5 hours old. CTA Head/Neck shows known left ICA occlusion with perfusion defect in left MCA territory likely from delayed flow from collateral circulation, no other occlusions, also core infarct in area of subacute infarct shown on CT Head. ED MD spoke with neuroIR via transfer line as per hospital protocol and he was accepted for transfer to determine if he is a candidate for intervention. Presentation is concerning for acute-subacute ischemic stroke(s), stroke workup advised.  Metrics: Last Known Well: 12/14/2019 21:30:00 [facility] Notification Time: 12/14/2019 23:10:06 Arrival Time: 12/14/2019 23:10:00 Stamp Time: 12/14/2019 23:10:06 Time First Login Attempt: 12/14/2019 23:14:44 Symptoms: right sided numbness, aphasia NIHSS Start Assessment Time: 12/14/2019 23:19:54 Patient is not a candidate for Alteplase/Activase. Alteplase Medical Decision: 12/14/2019 23:33:37 Patient was not deemed candidate for  Alteplase/Activase thrombolytics because of Resolved symptoms (no residual disabling symptoms).  CT head was reviewed.  Clinical Presentation is Suggestive of Large Vessel Occlusive Disease, Recommendations are as Follows  CTA Head and Neck. CT Perfusion. Advanced Imaging to be Reviewed by ED Provider and NIR.   Radiologist was called back for review of advanced imaging on 12/15/2019 00:21:40 Not discussed with neurointerventionalist because ED MD spoke with neuroIR via transfer line as per hospital protocol ED Physician notified of diagnostic impression and management plan on 12/14/2019 23:34:23  Our recommendations are outlined below.  Recommendations:     .  Activate Stroke Protocol Admission/Order Set     .  Stroke/Telemetry Floor     .  Neuro Checks     .  Initiate Aspirin 325 MG Daily     .  Atorvastatin 40mg  daily     .  Normal Saline IV at 32ml/hr for 24 hours     .  MRI Brain w/o gadolinium to assess for stroke     .  Please allow for permissive HTN for 48 hours. Can use Labetalol 200mg  po q6h prn for SBP>220 or DBP>120 (goal 15% reduction in first 24 hours)     .  Maximize blood glucose control with insulin coverage (goal FS 60-180)     .  Please maintain euthermia. Tylenol prn for temp >100.4     .  TTE     .  Telemetry monitoring     .  Lipid Profile, A1C     .  PT/OT, Speech/Swallow evaluation     .  Dysphagia screen before starting PO diet     .  Intermittent Pneumatic Compression for DVT Prophylaxis     .  Infectious/metabolic workup as per primary team  Routine Consultation  with [facility] for Follow up Care  Sign Out:     .  Discussed with Emergency Department Provider    ------------------------------------------------------------------------------  History of Present Illness: Patient is a 49 year old Male.  Patient was brought by EMS for symptoms of right sided numbness, aphasia  49yo man w/PMH of HTN, HLD, sleep apnea, left carotid artery  occlusion p/w right sided numbness, aphasia on 12/14/19. He started to feel "funny" at 21:30 and laid down to take a nap but noticed he started to have trouble speaking as well so he called EMS. He reports his speech has improved since arrival but the numbness is still present. He denies weakness, vision changes, dysphagia, dizziness, imbalance, LOC, headache, N/V, recent illness.  Last seen normal was within 4.5 hours. There is no history of hemorrhagic complications or intracranial hemorrhage. There is no history of Recent Anticoagulants. There is no history of recent major surgery. There is no history of recent stroke.  Past Medical History:     . Hypertension     . Hyperlipidemia     . There is NO history of Diabetes Mellitus     . There is NO history of Atrial Fibrillation     . There is NO history of Coronary Artery Disease     . There is NO history of Stroke  Anticoagulant use:  No  Antiplatelet use: aspirin    Examination: BP(143/90), Pulse(68), Blood Glucose(145) 1A: Level of Consciousness - Alert; keenly responsive + 0 1B: Ask Month and Age - Both Questions Right + 0 1C: Blink Eyes & Squeeze Hands - Performs Both Tasks + 0 2: Test Horizontal Extraocular Movements - Normal + 0 3: Test Visual Fields - No Visual Loss + 0 4: Test Facial Palsy (Use Grimace if Obtunded) - Normal symmetry + 0 5A: Test Left Arm Motor Drift - No Drift for 10 Seconds + 0 5B: Test Right Arm Motor Drift - No Drift for 10 Seconds + 0 6A: Test Left Leg Motor Drift - No Drift for 5 Seconds + 0 6B: Test Right Leg Motor Drift - No Drift for 5 Seconds + 0 7: Test Limb Ataxia (FNF/Heel-Shin) - No Ataxia + 0 8: Test Sensation - Mild-Moderate Loss: Less Sharp/More Dull + 1 9: Test Language/Aphasia - Normal; No aphasia + 0 10: Test Dysarthria - Normal + 0 11: Test Extinction/Inattention - No abnormality + 0  NIHSS Score: 1  Pre-Morbid Modified Ranking Scale: 0 Points = No symptoms at  all   Patient/Family was informed the Neurology Consult would occur via TeleHealth consult by way of interactive audio and video telecommunications and consented to receiving care in this manner.   Due to the immediate potential for life-threatening deterioration due to underlying acute neurologic illness, I spent 35 minutes providing critical care. This time includes time for face to face visit via telemedicine, review of medical records, imaging studies and discussion of findings with providers, the patient and/or family.   Dr Lenard Galloway Donyae Kohn   [facility] [phone]  Case [id number]

## 2019-12-15 NOTE — Procedures (Signed)
S/P bilateral common carotid ,RT VA and Lt subclavian arteriograms. RT CFA approach. Findings. 1.Occluded LT ICA prox with robust reconstitution in the cavernous seg from LT ECA branches via  Ophthalmic A..  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 3.LT MCA collateral flow  from RT ICA via ACOM and  RT VA via LT PCOM. 4. No angio evidence of string sign.  Post procedure neuro intact.  Distal pulses DPs palpable ,and PTs dopplerable. BP RT 144/87, LT  111/77. S.Karman Veney MD

## 2019-12-15 NOTE — Progress Notes (Addendum)
Per Dr. Estanislado Pandy, unable to revascularize due to consolidated left ICA clot. Will need to be on Plavix and ASA.   Starting neosynephrine with BP goal of 160-180.  Clevidipine gtt order changed: Use only if SBP > 180 and only if neosynephrine is held.   Electronically signed: Dr. Kerney Elbe

## 2019-12-15 NOTE — Anesthesia Procedure Notes (Signed)
Procedure Name: Intubation Date/Time: 12/15/2019 5:39 AM Performed by: Suzy Bouchard, CRNA Pre-anesthesia Checklist: Patient identified, Emergency Drugs available, Suction available, Patient being monitored and Timeout performed Patient Re-evaluated:Patient Re-evaluated prior to induction Oxygen Delivery Method: Circle system utilized Preoxygenation: Pre-oxygenation with 100% oxygen Induction Type: IV induction and Rapid sequence Laryngoscope Size: Glidescope and 4 Grade View: Grade I Tube type: Oral Tube size: 7.5 mm Number of attempts: 1 Airway Equipment and Method: Stylet and Video-laryngoscopy Placement Confirmation: ETT inserted through vocal cords under direct vision,  positive ETCO2 and breath sounds checked- equal and bilateral Secured at: 22 cm Tube secured with: Tape Dental Injury: Teeth and Oropharynx as per pre-operative assessment

## 2019-12-15 NOTE — Progress Notes (Signed)
At 0420, patient called out saying he could no longer move his right arm. After a few seconds, he began to regain function. Able to lift arm now but still has numbness/decreased sensation on the right. Worsening dysarthria. Stat CT done- awaiting orders.

## 2019-12-15 NOTE — Anesthesia Preprocedure Evaluation (Addendum)
Anesthesia Evaluation  Patient identified by MRN, date of birth, ID band Patient confused    Reviewed: Allergy & Precautions, H&P , NPO status , Patient's Chart, lab work & pertinent test results, reviewed documented beta blocker date and time   Airway Mallampati: IV  TM Distance: >3 FB Neck ROM: Full    Dental no notable dental hx. (+) Teeth Intact, Dental Advisory Given   Pulmonary sleep apnea , Current Smoker,    Pulmonary exam normal breath sounds clear to auscultation       Cardiovascular hypertension, Pt. on medications and Pt. on home beta blockers + CAD, + Past MI and + Cardiac Stents   Rhythm:Regular Rate:Normal     Neuro/Psych Depression CVA, Residual Symptoms    GI/Hepatic negative GI ROS, Neg liver ROS,   Endo/Other  Morbid obesity  Renal/GU negative Renal ROS  negative genitourinary   Musculoskeletal   Abdominal   Peds  Hematology negative hematology ROS (+)   Anesthesia Other Findings   Reproductive/Obstetrics negative OB ROS                            Anesthesia Physical Anesthesia Plan  ASA: III and emergent  Anesthesia Plan: General   Post-op Pain Management:    Induction: Intravenous, Rapid sequence and Cricoid pressure planned  PONV Risk Score and Plan: 1 and Ondansetron and Treatment may vary due to age or medical condition  Airway Management Planned: Oral ETT  Additional Equipment:   Intra-op Plan:   Post-operative Plan: Possible Post-op intubation/ventilation  Informed Consent: I have reviewed the patients History and Physical, chart, labs and discussed the procedure including the risks, benefits and alternatives for the proposed anesthesia with the patient or authorized representative who has indicated his/her understanding and acceptance.     Dental advisory given  Plan Discussed with: CRNA  Anesthesia Plan Comments:        Anesthesia Quick  Evaluation

## 2019-12-15 NOTE — Transfer of Care (Signed)
Immediate Anesthesia Transfer of Care Note  Patient: Brandon [name]  Procedure(s) Performed: IR WITH ANESTHESIA (N/A )  Patient Location: ICU  Anesthesia Type:General  Level of Consciousness: awake, alert , oriented, patient cooperative and responds to stimulation  Airway & Oxygen Therapy: Patient Spontanous Breathing and Patient connected to face mask oxygen  Post-op Assessment: Report given to RN, Post -op Vital signs reviewed and stable and Patient moving all extremities X 4  Post vital signs: Reviewed and stable  Last Vitals:  Vitals Value Taken Time  BP 127/85 12/15/19 0820  Temp 36.8 C 12/15/19 0820  Pulse 72 12/15/19 0829  Resp 16 12/15/19 0829  SpO2 99 % 12/15/19 0829  Vitals shown include unvalidated device data.  Last Pain:  Vitals:   12/15/19 0820  TempSrc:   PainSc: 0-No pain         Complications: No apparent anesthesia complications

## 2019-12-15 NOTE — Procedures (Addendum)
S/P RT common carotid and LT subclavian arteriograms. RT CFA approach. S/P unsuccessful at  revascularization of LT ICA .  Occluded Lt subclavian artery prox. S.Willine Schwalbe MD .  Post procedure CT brain reveals no ICH or mass effect. 98F exoseal closure device for hemostasis. Distal pulses. Dopplerable DPs  and dopplerable PTs. Patient extubated. . Pupils 29mm RT = LT .  Raises arms to command. Moves both lower extremities spontaneuosly and to command. S.Windsor Zirkelbach MD

## 2019-12-15 NOTE — Progress Notes (Signed)
STROKE TEAM PROGRESS NOTE   INTERVAL HISTORY Pt lying in bed, BP stable on Neo. However significant fluid volume at 250cc/h of Neo, 75cc/h of NS. Pt currently neuro intact, mild dysarthria but no focal deficit. Will give albumin, increase NS to 100, but switch Neo to levophed for better fluid volume. BP goal 140-180  Vitals:   12/15/19 0515 12/15/19 0530 12/15/19 0820 12/15/19 0835  BP: 120/82 128/84 127/85 133/76  Pulse: 62 71 76 73  Resp: 18 (!) 24 14 16   Temp:   98.3 F (36.8 C)   TempSrc:      SpO2: 98% 99% 98% 96%  Weight:        CBC:  Recent Labs  Lab 12/14/19 2321 12/14/19 2321 12/14/19 2340 12/15/19 0357  WBC 11.9*  --   --  10.3  NEUTROABS 5.9  --   --  4.9  HGB 16.0   < > 15.6 16.0  HCT 47.4   < > 46.0 46.1  MCV 93.9  --   --  92.0  PLT 198  --   --  184   < > = values in this interval not displayed.    Basic Metabolic Panel:  Recent Labs  Lab 12/14/19 2321 12/14/19 2321 12/14/19 2340 12/15/19 0357  NA 139   < > 139 137  K 3.5   < > 3.4* 4.0  CL 102   < > 104 102  CO2 28  --   --  25  GLUCOSE 151*   < > 149* 110*  BUN 13   < > 11 10  CREATININE 0.89   < > 0.80 0.94  CALCIUM 8.9  --   --  8.8*   < > = values in this interval not displayed.   Lipid Panel:     Component Value Date/Time   CHOL 155 12/15/2019 0344   CHOL 215 (H) 12/04/2019 1312   TRIG 152 (H) 12/15/2019 0344   HDL 32 (L) 12/15/2019 0344   HDL 42 12/04/2019 1312   CHOLHDL 4.8 12/15/2019 0344   VLDL 30 12/15/2019 0344   LDLCALC 93 12/15/2019 0344   LDLCALC 146 (H) 12/04/2019 1312   HgbA1c:  Lab Results  Component Value Date   HGBA1C 6.1 (H) 12/15/2019   Urine Drug Screen: No results found for: LABOPIA, COCAINSCRNUR, LABBENZ, AMPHETMU, THCU, LABBARB  Alcohol Level     Component Value Date/Time   ETH <10 12/14/2019 2321    IMAGING past 48 hours CT Angio Head W or Wo Contrast  Result Date: 12/15/2019 CLINICAL DATA:  Slurred speech.  Right-sided facial droop EXAM: CT  ANGIOGRAPHY HEAD AND NECK CT PERFUSION BRAIN TECHNIQUE: Multidetector CT imaging of the head and neck was performed using the standard protocol during bolus administration of intravenous contrast. Multiplanar CT image reconstructions and MIPs were obtained to evaluate the vascular anatomy. Carotid stenosis measurements (when applicable) are obtained utilizing NASCET criteria, using the distal internal carotid diameter as the denominator. Multiphase CT imaging of the brain was performed following IV bolus contrast injection. Subsequent parametric perfusion maps were calculated using RAPID software. CONTRAST:  15mL OMNIPAQUE IOHEXOL 350 MG/ML SOLN COMPARISON:  Head CT earlier same day. FINDINGS: CTA NECK FINDINGS Aortic arch: Aortic atherosclerosis. Right carotid system: Common carotid artery widely patent to the bifurcation region. Soft and calcified plaque at the carotid bifurcation and ICA bulb. No measurable stenosis however. Cervical ICA widely patent beyond bulb. Left carotid system: Common carotid artery shows intimal thickening but is widely  patent to the bifurcation. There is occlusion of the ICA at the bifurcation. No reconstituted flow in the cervical region. ECA widely patent. Vertebral arteries: Right subclavian artery is widely patent. Right vertebral artery is widely patent through the cervical region. Left subclavian artery is occluded just beyond its origin, with reconstituted flow likely due to retrograde flow in the left vertebral artery. Skeleton: Negative Other neck: No mass or lymphadenopathy. Upper chest: Upper lungs are clear. Review of the MIP images confirms the above findings CTA HEAD FINDINGS Anterior circulation: Right internal carotid artery is patent through the skull base and siphon region. No antegrade flow in the left ICA at the skull base level. There is reconstituted flow in the siphon, probably with contributions from external to internal collaterals, patent anterior communicating  artery and patent posterior communicating artery at least on the left. Right anterior and middle cerebral vessels appear widely patent. Left anterior and middle cerebral vessels show flow. No large or medium vessel occlusion is identified. Posterior circulation: Both vertebral arteries show flow at the foramen magnum with presumed retrograde flow on the left. Basilar artery appears normal. Superior cerebellar and posterior cerebral arteries appear normal. Venous sinuses: Patent and normal. Anatomic variants: None other significant. Review of the MIP images confirms the above findings CT Brain Perfusion Findings: ASPECTS: 9 CBF (<30%) Volume: 29mL Perfusion (Tmax>6.0s) volume: 242mL Mismatch Volume: 267mL Infarction Location:Left parietal vertex as shown by CT IMPRESSION: Occlusion of the left internal carotid artery at the bifurcation. Reconstituted flow in the left carotid siphon likely due to external to internal collaterals, patent anterior communicating artery and patent posterior communicating artery on the left. No large or medium vessel intracranial occlusion is identified. 6 cc region of completed infarction at the left parietal vertex. 208 cc region of T-max greater than 6 seconds throughout the left MCA territory due to the reconstituted flow described above. 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 These results were called by telephone at the time of interpretation on 12/15/2019 at 12:04 am to provider Veryl Speak , who verbally acknowledged these results. Electronically Signed   By: Nelson Chimes M.D.   On: 12/15/2019 00:08   CT HEAD WO CONTRAST  Result Date: 12/15/2019 CLINICAL DATA:  Subacute neuro deficit EXAM: CT HEAD WITHOUT CONTRAST TECHNIQUE: Contiguous axial images were obtained from the base of the skull through the vertex without intravenous  contrast. COMPARISON:  Yesterday FINDINGS: Brain: 2 small areas of cytotoxic edema along the left parietal and superior frontal convexities, mildly progressed from before. No hemorrhage, hydrocephalus, or midline shift. Vascular: There is still circulating intravascular contrast. No vessel asymmetry. 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 No hemorrhagic conversion. Electronically Signed   By: Monte Fantasia M.D.   On: 12/15/2019 04:52   CT Angio Neck W and/or Wo Contrast  Result Date: 12/15/2019 CLINICAL DATA:  Slurred speech.  Right-sided facial droop EXAM: CT ANGIOGRAPHY HEAD AND NECK CT PERFUSION BRAIN TECHNIQUE: Multidetector CT imaging of the head and neck was performed using the standard protocol during bolus administration of intravenous contrast. Multiplanar CT image reconstructions and MIPs were obtained to evaluate the vascular anatomy. Carotid stenosis measurements (when applicable) are obtained utilizing NASCET criteria, using the distal internal carotid diameter as the denominator. Multiphase CT imaging of the brain was performed following IV bolus contrast injection. Subsequent parametric perfusion  maps were calculated using RAPID software. CONTRAST:  175mL OMNIPAQUE IOHEXOL 350 MG/ML SOLN COMPARISON:  Head CT earlier same day. FINDINGS: CTA NECK FINDINGS Aortic arch: Aortic atherosclerosis. Right carotid system: Common carotid artery widely patent to the bifurcation region. Soft and calcified plaque at the carotid bifurcation and ICA bulb. No measurable stenosis however. Cervical ICA widely patent beyond bulb. Left carotid system: Common carotid artery shows intimal thickening but is widely patent to the bifurcation. There is occlusion of the ICA at the bifurcation. No reconstituted flow in the cervical region. ECA widely patent. Vertebral arteries: Right subclavian artery is widely patent. Right  vertebral artery is widely patent through the cervical region. Left subclavian artery is occluded just beyond its origin, with reconstituted flow likely due to retrograde flow in the left vertebral artery. Skeleton: Negative Other neck: No mass or lymphadenopathy. Upper chest: Upper lungs are clear. Review of the MIP images confirms the above findings CTA HEAD FINDINGS Anterior circulation: Right internal carotid artery is patent through the skull base and siphon region. No antegrade flow in the left ICA at the skull base level. There is reconstituted flow in the siphon, probably with contributions from external to internal collaterals, patent anterior communicating artery and patent posterior communicating artery at least on the left. Right anterior and middle cerebral vessels appear widely patent. Left anterior and middle cerebral vessels show flow. No large or medium vessel occlusion is identified. Posterior circulation: Both vertebral arteries show flow at the foramen magnum with presumed retrograde flow on the left. Basilar artery appears normal. Superior cerebellar and posterior cerebral arteries appear normal. Venous sinuses: Patent and normal. Anatomic variants: None other significant. Review of the MIP images confirms the above findings CT Brain Perfusion Findings: ASPECTS: 9 CBF (<30%) Volume: 100mL Perfusion (Tmax>6.0s) volume: 237mL Mismatch Volume: 253mL Infarction Location:Left parietal vertex as shown by CT IMPRESSION: Occlusion of the left internal carotid artery at the bifurcation. Reconstituted flow in the left carotid siphon likely due to external to internal collaterals, patent anterior communicating artery and patent posterior communicating artery on the left. No large or medium vessel intracranial occlusion is identified. 6 cc region of completed infarction at the left parietal vertex. 208 cc region of T-max greater than 6 seconds throughout the left MCA territory due to the reconstituted flow  described above. 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 These results were called by telephone at the time of interpretation on 12/15/2019 at 12:04 am to provider Veryl Speak , who verbally acknowledged these results. Electronically Signed   By: Nelson Chimes M.D.   On: 12/15/2019 00:08   CT CEREBRAL PERFUSION W CONTRAST  Result Date: 12/15/2019 CLINICAL DATA:  Slurred speech.  Right-sided facial droop EXAM: CT ANGIOGRAPHY HEAD AND NECK CT PERFUSION BRAIN TECHNIQUE: Multidetector CT imaging of the head and neck was performed using the standard protocol during bolus administration of intravenous contrast. Multiplanar CT image reconstructions and MIPs were obtained to evaluate the vascular anatomy. Carotid stenosis measurements (when applicable) are obtained utilizing NASCET criteria, using the distal internal carotid diameter as the denominator. Multiphase CT imaging of the brain was performed following IV bolus contrast injection. Subsequent parametric perfusion maps were calculated using RAPID software. CONTRAST:  113mL OMNIPAQUE IOHEXOL 350 MG/ML SOLN COMPARISON:  Head CT earlier same day. FINDINGS: CTA NECK FINDINGS Aortic arch: Aortic atherosclerosis. Right carotid system: Common carotid artery widely patent to the bifurcation  region. Soft and calcified plaque at the carotid bifurcation and ICA bulb. No measurable stenosis however. Cervical ICA widely patent beyond bulb. Left carotid system: Common carotid artery shows intimal thickening but is widely patent to the bifurcation. There is occlusion of the ICA at the bifurcation. No reconstituted flow in the cervical region. ECA widely patent. Vertebral arteries: Right subclavian artery is widely patent. Right vertebral artery is widely patent through the cervical region. Left subclavian artery is occluded just  beyond its origin, with reconstituted flow likely due to retrograde flow in the left vertebral artery. Skeleton: Negative Other neck: No mass or lymphadenopathy. Upper chest: Upper lungs are clear. Review of the MIP images confirms the above findings CTA HEAD FINDINGS Anterior circulation: Right internal carotid artery is patent through the skull base and siphon region. No antegrade flow in the left ICA at the skull base level. There is reconstituted flow in the siphon, probably with contributions from external to internal collaterals, patent anterior communicating artery and patent posterior communicating artery at least on the left. Right anterior and middle cerebral vessels appear widely patent. Left anterior and middle cerebral vessels show flow. No large or medium vessel occlusion is identified. Posterior circulation: Both vertebral arteries show flow at the foramen magnum with presumed retrograde flow on the left. Basilar artery appears normal. Superior cerebellar and posterior cerebral arteries appear normal. Venous sinuses: Patent and normal. Anatomic variants: None other significant. Review of the MIP images confirms the above findings CT Brain Perfusion Findings: ASPECTS: 9 CBF (<30%) Volume: 60mL Perfusion (Tmax>6.0s) volume: 216mL Mismatch Volume: 279mL Infarction Location:Left parietal vertex as shown by CT IMPRESSION: Occlusion of the left internal carotid artery at the bifurcation. Reconstituted flow in the left carotid siphon likely due to external to internal collaterals, patent anterior communicating artery and patent posterior communicating artery on the left. No large or medium vessel intracranial occlusion is identified. 6 cc region of completed infarction at the left parietal vertex. 208 cc region of T-max greater than 6 seconds throughout the left MCA territory due to the reconstituted flow described above. Proximal left subclavian artery occlusion. 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 These results were called by telephone at the time of interpretation on 12/15/2019 at 12:04 am to provider Veryl Speak , who verbally acknowledged these results. Electronically Signed   By: Nelson Chimes M.D.   On: 12/15/2019 00:08   DG CHEST PORT 1 VIEW  Result Date: 12/15/2019 CLINICAL DATA:  Stroke, hypertension, MI EXAM: PORTABLE CHEST 1 VIEW COMPARISON:  Radiograph 09/12/2018 FINDINGS: Lung volumes are low with hazy interstitial opacities and cephalized, indistinct pulmonary vascularity. Cardiac silhouette is markedly enlarged from comparison exams. No visible pneumothorax or effusion. No focal consolidative opacity. No acute osseous or soft tissue abnormality. Telemetry leads overlie the chest. IMPRESSION: 1. Markedly enlarged cardiac silhouette from comparison exams. Could reflect cardiomegaly or pericardial effusion. 2. Additional features of likely vascular congestion and interstitial edema on a background of low volumes and atelectasis. Electronically Signed   By: Lovena Le M.D.   On: 12/15/2019 06:26   CT HEAD CODE STROKE WO CONTRAST  Result Date: 12/14/2019 CLINICAL DATA:  Code stroke. Slurred speech and right-sided facial droop EXAM: CT HEAD WITHOUT CONTRAST TECHNIQUE: Contiguous axial images were obtained from the base of the skull through the vertex without intravenous contrast. COMPARISON:  None. FINDINGS: Brain: No acute hemorrhage. There is an area hypoattenuation extending to the  cortex of the superior left parietal lobe. There is no midline shift or other mass effect. No hydrocephalus. Vascular: Mild bilateral carotid atherosclerosis. No hyperdense vessel. Skull: Normal. Negative for fracture or focal lesion. Sinuses/Orbits: No acute finding. Other: None. ASPECTS [name] Stroke Program Early CT Score) - Ganglionic level infarction (caudate, lentiform nuclei, internal capsule, insula,  M1-M3 cortex): 7 - Supraganglionic infarction (M4-M6 cortex): 2 Total score (0-10 with 10 being normal): 9 IMPRESSION: 1. No intracranial hemorrhage. 2. 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 3. ASPECTS is 9. These results were called by telephone at the time of interpretation on 12/14/2019 at 11:21 pm to provider Veryl Speak , who verbally acknowledged these results. Electronically Signed   By: Ulyses Jarred M.D.   On: 12/14/2019 23:22   Cerebral Angio  12/15/2019 0310 S/P bilateral common carotid ,RT VA and Lt subclavian arteriograms. RT CFA approach.  1.Occluded LT ICA prox with robust reconstitution in the cavernous seg from LT ECA branches via  Ophthalmic A.. 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 3.LT MCA collateral flow  from RT ICA via ACOM and  RT VA via LT PCOM. 4. No angio evidence of string sign.  12/15/2019 at 0726 S/P RT common carotid and LT subclavian arteriograms. RT CFA approach. S/P unsuccessful at  revascularization of LT ICA .  Occluded Lt subclavian artery prox.  PHYSICAL EXAM  Temp:  [97.6 F (36.4 C)-98.4 F (36.9 C)] 97.6 F (36.4 C) (02/22 1200) Pulse Rate:  [38-99] 99 (02/22 1500) Resp:  [12-25] 19 (02/22 1500) BP: (108-183)/(63-100) 145/75 (02/22 1500) SpO2:  [91 %-100 %] 100 % (02/22 1500) Weight:  [131.5 kg] 131.5 kg (02/21 2314)  General - Well nourished, well developed, in no apparent distress.  Ophthalmologic - fundi not visualized due to noncooperation.  Cardiovascular - Regular rhythm and rate.  Mental Status -  Level of arousal and orientation to time, place, and person were intact. Language including expression, naming, repetition, comprehension was assessed and found intact. Fund of Knowledge was assessed and was intact.  Cranial Nerves II - XII - II - Visual field intact OU. III, IV, VI - Extraocular movements intact. V - Facial sensation intact bilaterally. VII - Facial movement intact bilaterally. VIII  - Hearing & vestibular intact bilaterally. X - Palate elevates symmetrically. Mild dysarthria XI - Chin turning & shoulder shrug intact bilaterally. XII - Tongue protrusion intact  Motor Strength - The patient's strength was normal in all extremities and pronator drift was absent.  Bulk was normal and fasciculations were absent.   Motor Tone - Muscle tone was assessed at the neck and appendages and was normal.  Reflexes - The patient's reflexes were symmetrical in all extremities and he had no pathological reflexes.  Sensory - Light touch, temperature/pinprick were assessed and were symmetrical.    Coordination - The patient had normal movements in the hands with no ataxia or dysmetria.  Tremor was absent.  Gait and Station - deferred.   ASSESSMENT/PLAN [name] is a 49 y.o. male with history of CAD s/p stent, HTN, HLD, and tobacco use presenting to [name] [place] [facility] with hx stroke sx 2 days ago that completely resolved with imaging showing a completed R parietal infarct. Now with acute R sided weakness R facial droop, aphasia and dysarthria. Found to have a L ICA occlusion w/ large penumbra. Transferred to [facility] for IR.   TIA due to left hemisphere hypoperfusion secondary to chronic L ICA occlusion  CT head subacute superior L parietal infarct. ASPECTS 9    CTA head & neck L ICA occlusion at bifurcation. 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    CT perfusion 6cc L parietal vertex infarct. 208cc L MCA penumbra due to slow collateral flow   Cerebral angio 2/22 at 0310 L ICA occlusion. L SCA occlusion w/ subclavian steal.  Neuro worsening with RUE HP. CT w/ small L frontal lobe and parietal infarct s w/ mild progression.  Cerebral angio 2/22 at unsuccessful revascularization of L ICA occlusion. L SCA occlusion.  MRI  pending  MRA pending  2D Echo EF 55-60%  LDL 93  HgbA1c 6.1  Lovenox 40 mg sq daily for VTE prophylaxis  aspirin 81 mg  daily prior to admission, now on aspirin 325 mg daily and clopidogrel 75 mg daily after brilinta 180mg  load. Continue DAPT x 3 mo then plavix alone.   Therapy recommendations:  pending   Disposition:  pending   BP dependent left hemisphere hypoperfusion  New or worsening with BP at 120s  Was on Neo-Synephrine   Switched to Levophed for fluid volume  Increase IV fluid 200 cc/h  Albumin every 6 hours for 4 doses  BP goal 140-180  Head of bed flat  Bedrest for today  Orthostatic vitals tomorrow before PT/OT session  May consider midodrine if not able to wean off pressor  Left ICA chronic occlusion  Seen by cardiology  2/17 carotid Doppler left ICA occlusion  CTA head and neck left ICA likely chronic occlusion due to significance and the collateral flow on the left MCA through [facility] and PCOM  Cerebral angio again demonstrated left ICA occlusion with significant collateral flow  Left subclavian steal  Carotid Doppler 2/17 indicate left VA retrograde flow  CTA head and neck indicate left subclavian artery occlusion with possible steal  Cerebral angio again demonstrated 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  Hypertension  Home meds:  Coreg 25 bid . Current BP goal 140-180  . On levophed  . Long-term BP goal 130-160  Hyperlipidemia  Home meds:  lipitor 80, resumed in [name]  LDL 93, goal < 70  Continue statin at discharge  Dysphagia . NPO post procedure . ST to assess once off bedrest . NPO for now . Speech on board  Tobacco abuse  Current smoker  Smoking cessation counseling provided  Pt is willing to quit   Other Stroke Risk Factors  Morbid Obesity, Body mass index is 42.81 kg/m., recommend weight loss, diet and exercise as appropriate   Coronary artery  disease s/p stent 2006, 2011  Family hx cardiovascular dz at young age (father died of MI at 90)  OSA  Other Active Problems  BPH on flomax  [name] day # 0  This patient is critically ill due to symptomatic left hemisphere hypoperfusion, chronic left ICA occlusion, left subclavian steal, BP dependent and at significant risk of neurological worsening, death form stroke, hemorrhagic conversion, cerebral edema, heart failure. This patient's care requires constant monitoring of vital signs, hemodynamics, respiratory and cardiac monitoring, review of multiple databases, neurological assessment, discussion with family, other specialists and medical decision making of high complexity. I spent 30 minutes of neurocritical care time in the care of this patient. I discussed with Dr. Estanislado Pandy.  Rosalin Hawking, MD PhD Stroke Neurology 12/15/2019 4:43  PM   To contact Stroke Continuity provider, please refer to [email or web address] After hours, contact General Neurology

## 2019-12-15 NOTE — Progress Notes (Signed)
Bedside report given

## 2019-12-15 NOTE — Progress Notes (Signed)
Done KH 

## 2019-12-15 NOTE — Progress Notes (Signed)
Call TIme  1101  PM Beeper Time  [place]

## 2019-12-15 NOTE — Anesthesia Postprocedure Evaluation (Signed)
Anesthesia Post Note  Patient: Brandon [name]  Procedure(s) Performed: IR WITH ANESTHESIA (N/A )     Patient location during evaluation: PACU Anesthesia Type: General Level of consciousness: awake and alert Pain management: pain level controlled Vital Signs Assessment: post-procedure vital signs reviewed and stable Respiratory status: spontaneous breathing, nonlabored ventilation, respiratory function stable and patient connected to nasal cannula oxygen Cardiovascular status: blood pressure returned to baseline and stable Postop Assessment: no apparent nausea or vomiting Anesthetic complications: no    Last Vitals:  Vitals:   12/15/19 1145 12/15/19 1200  BP:    Pulse: 67 69  Resp: 16 20  Temp:    SpO2: 96% 97%    Last Pain:  Vitals:   12/15/19 0820  TempSrc:   PainSc: 0-No pain                 Effie Berkshire

## 2019-12-16 ENCOUNTER — Inpatient Hospital Stay (HOSPITAL_COMMUNITY): Payer: 59

## 2019-12-16 DIAGNOSIS — I951 Orthostatic hypotension: Secondary | ICD-10-CM

## 2019-12-16 DIAGNOSIS — I10 Essential (primary) hypertension: Secondary | ICD-10-CM

## 2019-12-16 DIAGNOSIS — I708 Atherosclerosis of other arteries: Secondary | ICD-10-CM

## 2019-12-16 LAB — BASIC METABOLIC PANEL
Anion gap: 11 (ref 5–15)
BUN: 7 mg/dL (ref 6–20)
CO2: 21 mmol/L — ABNORMAL LOW (ref 22–32)
Calcium: 8.7 mg/dL — ABNORMAL LOW (ref 8.9–10.3)
Chloride: 107 mmol/L (ref 98–111)
Creatinine, Ser: 0.75 mg/dL (ref 0.61–1.24)
GFR calc Af Amer: >60 mL/min (ref >60)
GFR calc non Af Amer: >60 mL/min (ref >60)
Glucose, Bld: 110 mg/dL — ABNORMAL HIGH (ref 70–99)
Potassium: 3.4 mmol/L — ABNORMAL LOW (ref 3.5–5.1)
Sodium: 139 mmol/L (ref 135–145)

## 2019-12-16 LAB — CBC
HCT: 40.8 % (ref 39.0–52.0)
Hemoglobin: 14.2 g/dL (ref 13.0–17.0)
MCH: 32 pg (ref 26.0–34.0)
MCHC: 34.8 g/dL (ref 30.0–36.0)
MCV: 91.9 fL (ref 80.0–100.0)
Platelets: 153 10*3/uL (ref 150–400)
RBC: 4.44 MIL/uL (ref 4.22–5.81)
RDW: 12.9 % (ref 11.5–15.5)
WBC: 10 10*3/uL (ref 4.0–10.5)
nRBC: 0 % (ref 0.0–0.2)

## 2019-12-16 NOTE — Evaluation (Signed)
Physical Therapy Evaluation Patient Details Name: Brandon [name] MRN: [id number] DOB: [birth year] Today's Date: 12/16/2019   History of Present Illness  49 yo male presents with R facial droop, aphasia, dysarthria and R side weakness. 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 PMH: ASCVD, heart attack, HLD, HTN, tobacco abuse  Clinical Impression  Patient presents with mild L sided weakness and decreased activity tolerance with BP parameters set per MD.  He needed minA to minguard A for mobility this session.  Likely to progress and be able to return home with family support (wife can provide S, she is using walker herself), and follow up HHPT.  PT to follow acutely.    Follow Up Recommendations Home health PT    Equipment Recommendations  Other (comment)(TBA)    Recommendations for Other Services       Precautions / Restrictions Precautions Precautions: Fall Precaution Comments: watch BP      Mobility  Bed Mobility Overal bed mobility: Needs Assistance Bed Mobility: Supine to Sit;Rolling Rolling: Min guard   Supine to sit: Min guard     General bed mobility comments: Pt exiting the bed on right side. Pt requires a rocking momentum to progress to static sitting and pushing / pulling on sheet  Transfers Overall transfer level: Needs assistance Equipment used: 1 person hand held assist Transfers: Sit to/from [facility] Sit to Stand: Min assist Stand pivot transfers: Min assist       General transfer comment: pt able to power up from bed surface and using IV pole with R UE for steady (A), stand step to recliner with HHA  Ambulation/Gait             General Gait Details: bed to chair only due to BP  Stairs            Wheelchair Mobility    Modified Rankin (Stroke Patients Only) Modified Rankin (Stroke Patients Only) Pre-Morbid Rankin Score: No symptoms Modified Rankin: Moderately severe disability     Balance Overall  balance assessment: Needs assistance Sitting-balance support: Bilateral upper extremity supported;Feet supported Sitting balance-Leahy Scale: Fair     Standing balance support: Single extremity supported;During functional activity Standing balance-Leahy Scale: Fair                               Pertinent Vitals/Pain Pain Assessment: No/denies pain    Home Living Family/patient expects to be discharged to:: Private residence Living Arrangements: Spouse/significant other Available Help at Discharge: Family Type of Home: House Home Access: Ramped entrance;Stairs to enter   Technical brewer of Steps: 2 Home Layout: One level Home Equipment: Shower seat Additional Comments: wife has Parkinson's but able to complete all her adls . Pt reports he must drive her as the only (A) he provides at this time    Prior Function Level of Independence: Independent         Comments: works as Education officer, community for [employer]   Dominant Hand: Right    Extremity/Trunk Assessment   Upper Extremity Assessment Upper Extremity Assessment: Defer to OT evaluation    Lower Extremity Assessment Lower Extremity Assessment: LLE deficits/detail LLE Deficits / Details: AROM WFL, strength hip flexion 3/5, knee extension 4/5, ankle DF 4/5    Cervical / Trunk Assessment Cervical / Trunk Assessment: Normal  Communication   Communication: No difficulties  Cognition Arousal/Alertness: Awake/alert Behavior During Therapy: WFL for tasks assessed/performed Overall  Cognitive Status: Within Functional Limits for tasks assessed                                        General Comments General comments (skin integrity, edema, etc.): orthostatic vital take, BP range per MD [id number] systolic and symptom dependent for optimizing cerebrtal perfusion    Exercises     Assessment/Plan    PT Assessment Patient needs continued PT services  PT Problem List  Decreased strength;Decreased mobility;Decreased activity tolerance;Decreased safety awareness;Decreased knowledge of precautions;Decreased knowledge of use of DME;Decreased balance       PT Treatment Interventions DME instruction;Stair training;Therapeutic activities;Balance training;Patient/family education;Therapeutic exercise;Functional mobility training;Gait training    PT Goals (Current goals can be found in the Care Plan section)  Acute Rehab PT Goals Patient Stated Goal: 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) PT Goal Formulation: With patient Time For Goal Achievement: 12/30/19 Potential to Achieve Goals: Good    Frequency Min 4X/week   Barriers to discharge        Co-evaluation PT/OT/SLP Co-Evaluation/Treatment: Yes Reason for Co-Treatment: To address functional/ADL transfers PT goals addressed during session: Mobility/safety with mobility;Balance OT goals addressed during session: ADL's and self-care;Proper use of Adaptive equipment and DME;Strengthening/ROM       AM-PAC PT "6 Clicks" Mobility  Outcome Measure Help needed turning from your back to your side while in a flat bed without using bedrails?: A Little Help needed moving from lying on your back to sitting on the side of a flat bed without using bedrails?: A Little Help needed moving to and from a bed to a chair (including a wheelchair)?: A Little Help needed standing up from a chair using your arms (e.g., wheelchair or bedside chair)?: A Little Help needed to walk in hospital room?: A Little Help needed climbing 3-5 steps with a railing? : A Lot 6 Click Score: 17    End of Session Equipment Utilized During Treatment: Gait belt Activity Tolerance: Treatment limited secondary to medical complications (Comment)(BP below goal in standing) Patient left: in chair;with call bell/phone within reach;with chair alarm set Nurse Communication: Mobility status;Other (comment)(BP) PT Visit Diagnosis: Other  abnormalities of gait and mobility (R26.89);Muscle weakness (generalized) (M62.81)    Time: [id number] PT Time Calculation (min) (ACUTE ONLY): 25 min   Charges:   PT Evaluation $PT Eval Moderate Complexity: [place], [place] Acute Rehabilitation Services [phone] 12/16/2019   Reginia Naas 12/16/2019, 11:58 AM

## 2019-12-16 NOTE — Progress Notes (Signed)
Referring Physician(s): Code stroke - Kerney Elbe  Supervising Physician: Luanne Bras  Patient Status:  [facility] - In-pt  Chief Complaint: Follow up dx cerebral angiogram x 2 on 12/15/19 with Dr. Estanislado Pandy  Subjective:  Patient laying flat in bed - per RN neurology has instructed patient to lay flat until this morning. He denies any complaints except that he wants to sit up. He denies any residual numbness, tingling or weakness.   Allergies: Patient has no known allergies.  Medications: Prior to Admission medications   Medication Sig Start Date End Date Taking? Authorizing Provider  aspirin EC 81 MG tablet Take 81 mg by mouth daily.   Yes [provider]  atorvastatin (LIPITOR) 80 MG tablet Take 1 tablet (80 mg total) by mouth daily. 10/31/19  Yes Denita Lung, MD  carvedilol (COREG) 25 MG tablet Take 1 tablet (25 mg total) by mouth 2 (two) times daily with a meal. 10/31/19  Yes Denita Lung, MD     Vital Signs: BP (!) 157/70   Pulse 74   Temp 99.7 F (37.6 C) (Oral)   Resp (!) 27   Wt 289 lb 14.5 oz (131.5 kg)   SpO2 97%   BMI 42.81 kg/m   Physical Exam Vitals and nursing note reviewed.  Constitutional:      General: He is not in acute distress. HENT:     Head: Normocephalic.  Cardiovascular:     Rate and Rhythm: Normal rate.  Pulmonary:     Effort: Pulmonary effort is normal.  Skin:    General: Skin is warm and dry.     Comments: (+) Right CFA puncture clean, dry, dressed appropriately - small amount of blood noted on dressing, no active bleeding. Soft, non tender, no obvious hematoma.  Neurological:     Mental Status: He is alert. Mental status is at baseline.  Psychiatric:        Mood and Affect: Mood normal.        Behavior: Behavior normal.        Thought Content: Thought content normal.        Judgment: Judgment normal.     Imaging: CT Angio Head W or Wo Contrast  Result Date: 12/15/2019 CLINICAL DATA:  Slurred speech.   Right-sided facial droop EXAM: CT ANGIOGRAPHY HEAD AND NECK CT PERFUSION BRAIN TECHNIQUE: Multidetector CT imaging of the head and neck was performed using the standard protocol during bolus administration of intravenous contrast. Multiplanar CT image reconstructions and MIPs were obtained to evaluate the vascular anatomy. Carotid stenosis measurements (when applicable) are obtained utilizing NASCET criteria, using the distal internal carotid diameter as the denominator. Multiphase CT imaging of the brain was performed following IV bolus contrast injection. Subsequent parametric perfusion maps were calculated using RAPID software. CONTRAST:  14mL OMNIPAQUE IOHEXOL 350 MG/ML SOLN COMPARISON:  Head CT earlier same day. FINDINGS: CTA NECK FINDINGS Aortic arch: Aortic atherosclerosis. Right carotid system: Common carotid artery widely patent to the bifurcation region. Soft and calcified plaque at the carotid bifurcation and ICA bulb. No measurable stenosis however. Cervical ICA widely patent beyond bulb. Left carotid system: Common carotid artery shows intimal thickening but is widely patent to the bifurcation. There is occlusion of the ICA at the bifurcation. No reconstituted flow in the cervical region. ECA widely patent. Vertebral arteries: Right subclavian artery is widely patent. Right vertebral artery is widely patent through the cervical region. Left subclavian artery is occluded just beyond its origin, with reconstituted flow likely  due to retrograde flow in the left vertebral artery. Skeleton: Negative Other neck: No mass or lymphadenopathy. Upper chest: Upper lungs are clear. Review of the MIP images confirms the above findings CTA HEAD FINDINGS Anterior circulation: Right internal carotid artery is patent through the skull base and siphon region. No antegrade flow in the left ICA at the skull base level. There is reconstituted flow in the siphon, probably with contributions from external to internal  collaterals, patent anterior communicating artery and patent posterior communicating artery at least on the left. Right anterior and middle cerebral vessels appear widely patent. Left anterior and middle cerebral vessels show flow. No large or medium vessel occlusion is identified. Posterior circulation: Both vertebral arteries show flow at the foramen magnum with presumed retrograde flow on the left. Basilar artery appears normal. Superior cerebellar and posterior cerebral arteries appear normal. Venous sinuses: Patent and normal. Anatomic variants: None other significant. 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 Reconstituted flow in the left carotid siphon likely due to external to internal collaterals, patent anterior communicating artery and patent posterior communicating artery on the left. No large or medium vessel intracranial occlusion is identified. 6 cc region of completed infarction at the left parietal vertex. 208 cc region of T-max greater than 6 seconds throughout the left MCA territory due to the reconstituted flow described above. 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 These results were called by telephone at the time of interpretation on 12/15/2019 at 12:04 am to provider Veryl Speak , who verbally acknowledged these results. Electronically Signed   By: Nelson Chimes M.D.   On: 12/15/2019 00:08   CT HEAD WO CONTRAST  Result Date: 12/15/2019 CLINICAL DATA:  Subacute neuro deficit EXAM: CT HEAD WITHOUT CONTRAST TECHNIQUE: Contiguous axial images were obtained from the base of the skull  through the vertex without intravenous contrast. COMPARISON:  Yesterday FINDINGS: Brain: 2 small areas of cytotoxic edema along the left parietal and superior frontal convexities, mildly progressed from before. No hemorrhage, hydrocephalus, or midline shift. Vascular: There is still circulating intravascular contrast. No vessel asymmetry. 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 No hemorrhagic conversion. Electronically Signed   By: Monte Fantasia M.D.   On: 12/15/2019 04:52   CT Angio Neck W and/or Wo Contrast  Result Date: 12/15/2019 CLINICAL DATA:  Slurred speech.  Right-sided facial droop EXAM: CT ANGIOGRAPHY HEAD AND NECK CT PERFUSION BRAIN TECHNIQUE: Multidetector CT imaging of the head and neck was performed using the standard protocol during bolus administration of intravenous contrast. Multiplanar CT image reconstructions and MIPs were obtained to evaluate the vascular anatomy. Carotid stenosis measurements (when applicable) are obtained utilizing NASCET criteria, using the distal internal carotid diameter as the denominator. Multiphase CT imaging of the brain was performed following IV bolus contrast injection. Subsequent parametric perfusion maps were calculated using RAPID software. CONTRAST:  173mL OMNIPAQUE IOHEXOL 350 MG/ML SOLN COMPARISON:  Head CT earlier same day. FINDINGS: CTA NECK FINDINGS Aortic arch: Aortic atherosclerosis. Right carotid system: Common carotid artery widely patent to the bifurcation region. Soft and calcified plaque at the carotid bifurcation and ICA bulb. No measurable  stenosis however. Cervical ICA widely patent beyond bulb. Left carotid system: Common carotid artery shows intimal thickening but is widely patent to the bifurcation. There is occlusion of the ICA at the bifurcation. No reconstituted flow in the cervical region. ECA widely patent. Vertebral arteries: Right  subclavian artery is widely patent. Right vertebral artery is widely patent through the cervical region. Left subclavian artery is occluded just beyond its origin, with reconstituted flow likely due to retrograde flow in the left vertebral artery. Skeleton: Negative Other neck: No mass or lymphadenopathy. Upper chest: Upper lungs are clear. Review of the MIP images confirms the above findings CTA HEAD FINDINGS Anterior circulation: Right internal carotid artery is patent through the skull base and siphon region. No antegrade flow in the left ICA at the skull base level. There is reconstituted flow in the siphon, probably with contributions from external to internal collaterals, patent anterior communicating artery and patent posterior communicating artery at least on the left. Right anterior and middle cerebral vessels appear widely patent. Left anterior and middle cerebral vessels show flow. No large or medium vessel occlusion is identified. Posterior circulation: Both vertebral arteries show flow at the foramen magnum with presumed retrograde flow on the left. Basilar artery appears normal. Superior cerebellar and posterior cerebral arteries appear normal. Venous sinuses: Patent and normal. Anatomic variants: None other significant. 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 Reconstituted flow in the left carotid siphon likely due to external to internal collaterals, patent anterior communicating artery and patent posterior communicating artery on the left. No large or medium vessel intracranial occlusion is identified. 6 cc region of completed infarction at the left parietal vertex. 208 cc region of T-max greater than 6 seconds throughout the left MCA  territory due to the reconstituted flow described above. 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 These results were called by telephone at the time of interpretation on 12/15/2019 at 12:04 am to provider Veryl Speak , who verbally acknowledged these results. Electronically Signed   By: Nelson Chimes M.D.   On: 12/15/2019 00:08   IR Angiogram Extremity Left  Result Date: 12/15/2019 INDICATION: Worsening neurological symptoms with dysarthria, right-sided numbness and weakness with difficulty with expression.  EXAM: 1. EMERGENT LARGE VESSEL OCCLUSION THROMBOLYSIS anterior CIRCULATION)  COMPARISON:  Diagnostic catheter arteriogram of December 14, 2019.  MEDICATIONS: Ancef 2 g IV antibiotic was administered within 1 hour of the procedure.  ANESTHESIA/SEDATION: General anesthesia.  CONTRAST:  Isovue 300 approximately 60 mL.  FLUOROSCOPY TIME:  Fluoroscopy Time: 52 minutes 30 seconds (2331 mGy).  COMPLICATIONS: None immediate.  TECHNIQUE: Following a full explanation of the procedure along with the potential associated complications, an informed witnessed consent was obtained patient and spouse. The risks of intracranial hemorrhage of 10%, worsening neurological deficit, ventilator dependency, death and inability to revascularize were all reviewed in detail with the patient's spouse.  The patient was then put under general anesthesia by the Department of Anesthesiology at [facility].  The right groin was prepped and draped in the usual sterile fashion. Thereafter using modified Seldinger technique, transfemoral access into the right common femoral artery was obtained without difficulty. Over a 0.035 inch guidewire a 8  French 23 cm Brite tip sheath was inserted. Through this, and also over a 0.035 inch guidewire a 5 [place] JB 1 catheter was advanced to the  aortic arch region and selectively positioned in the left common carotid artery and the left subclavian artery.  FINDINGS: The left common carotid arteriogram continues to demonstrate angiographically occluded left internal carotid artery at the bulb without evidence of a delayed string sign.  Reconstitution of the distal left internal carotid artery and the cavernous and supraclinoid segment is again demonstrated from the left external carotid artery collaterals via the ophthalmic artery.  PROCEDURE: The diagnostic JB 1 catheter in the left internal carotid artery was exchanged over a 0.035 inch 300 cm Rosen exchange guidewire for a 95 cm 0.087 balloon guide catheter which had been prepped with 50% contrast and 50% heparinized saline infusion.  The balloon guide was then advanced just proximal to the left common carotid artery bifurcation. The guidewire was removed. Good aspiration was obtained from the hub of the balloon guide catheter. Using biplane roadmap technique, initially an 014 inch standard Synchro micro guidewire was advanced inside of an 021 Trevo ProVue microcatheter.  Multiple attempts were made using a torque device to advance and access the occluded left internal carotid artery proximally without success.  The wire was then replaced with an 016 Fathom micro guidewire.  Again with the microcatheter tip right against the wall of the occluded left internal carotid artery at the bulb, and with the balloon guide advanced to abut the occluded left internal carotid artery at the bulb, multiple attempts were made to access the occluded left internal carotid artery without success.  This system was then replaced with a 5 French 125 cm slip cath. This was then advanced over a 0.0035 Roadrunner guidewire and directed against the wall of the left internal carotid artery at the bulb.  Multiple attempts were then made to access the occluded left internal carotid artery initially with an 035 inch  Roadrunner wire, and a glide guidewire without success.  Finally attempts were made to advance the curved end of the slip cath into the occluded left internal carotid artery at the bulb again without success. The balloon guide was then removed.  Over a 0.035 inch Roadrunner guidewire, a JB 1 5 French diagnostic catheter was then advanced and positioned in the occluded left subclavian artery.  Wire manipulation and catheter manipulation combination was then performed using a 0.035 inch Roadrunner guidewire, a glide wire, and finally an 016 Fathom micro guidewire without success.  The diagnostic catheter was then retrieved and removed.  The 8 French Brite tip 23 cm sheath was then removed with the successful application of a 7 [place] ExoSeal closure device.  The right groin appeared soft. Distal pulses remained Dopplerable in the dorsalis pedis, and the posterior tibial regions bilaterally unchanged.  A flat panel CT of the brain revealed no evidence of mass effect, midline shift or of intracranial hemorrhage.  The patient's general anesthesia was then reversed and the patient was then extubated without difficulty. Upon recovery, the patient was able to moved all fours equally, and also responded appropriately.  The patient was then returned to neuro ICU in stable condition for further management.  IMPRESSION: Status post unsuccessful attempt at recanalization of the left internal carotid artery proximally, and also of the left subclavian artery at its proximal occlusion.  PLAN: Follow-up in the clinic 4 weeks post discharge.   Electronically Signed   By: Corky Downs.D.  On: 12/15/2019 12:10   IR Angiogram Extremity Left  Result Date: 12/15/2019 Study Result CLINICAL DATA:  New onset of right-sided arm and leg numbness associated with episodes of intermittent dysarthria and difficulty finding words, right upper extremity weakness, and dizziness.  EXAM: IR ANGIO EXTERNAL CAROTID SEL EXT  CAROTID BILAT MOD SED  COMPARISON:  CT angiogram of the head and neck.  MEDICATIONS: Heparin 0 units IV; no antibiotic was administered within 1 hour of the procedure.  ANESTHESIA/SEDATION: Versed 1 mg IV; Fentanyl 100 mcg IV  Moderate Sedation Time:  36 minutes  The patient was continuously monitored during the procedure by the interventional radiology nurse under my direct supervision.  CONTRAST:  Isovue 300 approximately 60 mL.  FLUOROSCOPY TIME:  Fluoroscopy Time: 9 minutes 36 seconds (1894 mGy).  COMPLICATIONS: None immediate.  TECHNIQUE: Informed written consent was obtained from the patient after a thorough discussion of the procedural risks, benefits and alternatives. All questions were addressed. Maximal Sterile Barrier Technique was utilized including caps, mask, sterile gowns, sterile gloves, sterile drape, hand hygiene and skin antiseptic. A timeout was performed prior to the initiation of the procedure.  The right groin was prepped and draped in the usual sterile fashion. Thereafter using modified Seldinger technique, transfemoral access into the right common femoral artery was obtained without difficulty. Over a 0.035 inch guidewire, a 5 French Pinnacle sheath was inserted. Through this, and also over 0.035 inch guidewire, a 5 [place] JB 1 catheter was advanced to the aortic arch region and selectively positioned in the right common carotid artery, the right vertebral artery, the left common carotid artery and the left subclavian artery.  FINDINGS: The innominate artery angiogram demonstrates the origins of the right subclavian artery and the right common carotid artery to be widely patent.  The right common carotid arteriogram demonstrates mild atherosclerotic related narrowing at the origin of the right external carotid artery. Its branches opacify normally.  The right internal carotid artery at the bulb demonstrates mild stenosis with a partially calcified plaque without evidence of  intraluminal filling defects or of ulcerations.  At the level of C2 there is a fine linear band seen best on the lateral projection felt to be extraluminal.  Mild tortuosity is seen of the mid right ICA segment. More distally, the right internal carotid artery is seen to opacify to the cranial skull base. The petrous, cavernous and supraclinoid segments are widely patent.  The right middle cerebral artery and the right anterior cerebral artery opacify into the capillary and venous phases.  Prompt opacification via the anterior communicating artery of the left anterior cerebral A2 segment, the left anterior cerebral A1 hypoplastic segment, and the left middle cerebral artery is seen with mixing of unopacified blood from the left supraclinoid ICA.  The origin of the right vertebral artery is widely patent.  The vessel is seen to opacify to the cranial skull base. Wide patency is seen of the right vertebrobasilar junction and the right posterior-inferior cerebellar artery.  The basilar artery, the posterior cerebral arteries, the superior cerebellar arteries and the anterior-inferior cerebellar arteries demonstrate wide patency into the capillary and venous phases. However, prompt retrograde opacification via the left posterior communicating artery of the left middle cerebral artery territory is seen. Also demonstrated is retrograde opacification of the left vertebrobasilar junction opacifying the left posteroinferior cerebellar artery and retrogradely opacifying the left vertebral artery to the level of the left subclavian artery, consistent with left subclavian steal.  The left common carotid arteriogram demonstrates  the left external carotid artery origin and its branches to be widely patent.  The left internal carotid artery at the bulb demonstrates complete angiographic occlusion without evidence of a delayed string sign angiographically.  More distally, there is reconstitution of the caval cavernous  segment of the left internal carotid artery from the left external carotid artery branches via the ophthalmic artery.  There is mild narrowing of the supraclinoid left ICA.  Prompt appearance of the left middle cerebral artery proximally and of the superior and inferior divisions is seen with mixing of unopacified blood from the right internal carotid artery injection via the anterior communicating artery. Mild stenosis of the superior division of the left middle cerebral artery is noted.  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  Partial retrograde opacification of the left middle cerebral artery and the left anterior cerebral artery via the anterior communicating artery from the right internal carotid artery injection.  Collateral support to the left anterior and the left middle cerebral artery distribution from the vertebral artery from the right vertebral artery via the left posterior communicating artery.  Prompt retrograde opacification of the left vertebrobasilar junction to the left subclavian artery consistent with subclavian steal.  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  PLAN: Follow-up in the clinic 4 weeks post discharge.   Electronically Signed   By: Luanne Bras M.D.   On: 12/15/2019 11:41    IR CT Head Ltd  Result Date: 12/15/2019 INDICATION: Worsening neurological symptoms with dysarthria, right-sided numbness and weakness with difficulty with expression.  EXAM: 1. EMERGENT LARGE VESSEL OCCLUSION THROMBOLYSIS anterior CIRCULATION)  COMPARISON:  Diagnostic catheter arteriogram of  December 14, 2019.  MEDICATIONS: Ancef 2 g IV antibiotic was administered within 1 hour of the procedure.  ANESTHESIA/SEDATION: General anesthesia.  CONTRAST:  Isovue 300 approximately 60 mL.  FLUOROSCOPY TIME:  Fluoroscopy Time: 52 minutes 30 seconds (2331 mGy).  COMPLICATIONS: None immediate.  TECHNIQUE: Following a full explanation of the procedure along with the potential associated complications, an informed witnessed consent was obtained patient and spouse. The risks of intracranial hemorrhage of 10%, worsening neurological deficit, ventilator dependency, death and inability to revascularize were all reviewed in detail with the patient's spouse.  The patient was then put under general anesthesia by the Department of Anesthesiology at [facility].  The right groin was prepped and draped in the usual sterile fashion. Thereafter using modified Seldinger technique, transfemoral access into the right common femoral artery was obtained without difficulty. Over a 0.035 inch guidewire a 8 French 23 cm Brite tip sheath was inserted. Through this, and also over a 0.035 inch guidewire a 5 [place] JB 1 catheter was advanced to the aortic arch region and selectively positioned in the left common carotid artery and the left subclavian artery.  FINDINGS: The left common carotid arteriogram continues to demonstrate angiographically occluded left internal carotid artery at the bulb without evidence of a delayed string sign.  Reconstitution of the distal left internal carotid artery and the cavernous and supraclinoid segment is again demonstrated from the left external carotid artery collaterals via the ophthalmic artery.  PROCEDURE: The diagnostic JB 1 catheter in the left internal carotid artery was  exchanged over a 0.035 inch 300 cm Rosen exchange guidewire for a 95 cm 0.087 balloon guide catheter which had been prepped with 50% contrast and 50% heparinized saline infusion.  The balloon guide was then  advanced just proximal to the left common carotid artery bifurcation. The guidewire was removed. Good aspiration was obtained from the hub of the balloon guide catheter. Using biplane roadmap technique, initially an 014 inch standard Synchro micro guidewire was advanced inside of an 021 Trevo ProVue microcatheter.  Multiple attempts were made using a torque device to advance and access the occluded left internal carotid artery proximally without success.  The wire was then replaced with an 016 Fathom micro guidewire.  Again with the microcatheter tip right against the wall of the occluded left internal carotid artery at the bulb, and with the balloon guide advanced to abut the occluded left internal carotid artery at the bulb, multiple attempts were made to access the occluded left internal carotid artery without success.  This system was then replaced with a 5 French 125 cm slip cath. This was then advanced over a 0.0035 Roadrunner guidewire and directed against the wall of the left internal carotid artery at the bulb.  Multiple attempts were then made to access the occluded left internal carotid artery initially with an 035 inch Roadrunner wire, and a glide guidewire without success.  Finally attempts were made to advance the curved end of the slip cath into the occluded left internal carotid artery at the bulb again without success. The balloon guide was then removed.  Over a 0.035 inch Roadrunner guidewire, a JB 1 5 French diagnostic catheter was then advanced and positioned in the occluded left subclavian artery.  Wire manipulation and catheter manipulation combination was then performed using a 0.035 inch Roadrunner guidewire, a glide wire, and finally an 016 Fathom micro guidewire without success.  The diagnostic catheter was then retrieved and removed.  The 8 French Brite tip 23 cm sheath was then removed with the successful application of a 7 [place] ExoSeal closure device.  The right groin  appeared soft. Distal pulses remained Dopplerable in the dorsalis pedis, and the posterior tibial regions bilaterally unchanged.  A flat panel CT of the brain revealed no evidence of mass effect, midline shift or of intracranial hemorrhage.  The patient's general anesthesia was then reversed and the patient was then extubated without difficulty. Upon recovery, the patient was able to moved all fours equally, and also responded appropriately.  The patient was then returned to neuro ICU in stable condition for further management.  IMPRESSION: Status post unsuccessful attempt at recanalization of the left internal carotid artery proximally, and also of the left subclavian artery at its proximal occlusion.  PLAN: Follow-up in the clinic 4 weeks post discharge.   Electronically Signed   By: Luanne Bras M.D.   On: 12/15/2019 12:10   CT CEREBRAL PERFUSION W CONTRAST  Result Date: 12/15/2019 CLINICAL DATA:  Slurred speech.  Right-sided facial droop EXAM: CT ANGIOGRAPHY HEAD AND NECK CT PERFUSION BRAIN TECHNIQUE: Multidetector CT imaging of the head and neck was performed using the standard protocol during bolus administration of intravenous contrast. Multiplanar CT image reconstructions and MIPs were obtained to evaluate the vascular anatomy. Carotid stenosis measurements (when applicable) are obtained utilizing NASCET criteria, using the distal internal carotid diameter as the denominator. Multiphase CT imaging of the brain was performed following IV bolus contrast injection. Subsequent parametric perfusion maps were calculated using RAPID software. CONTRAST:  14mL OMNIPAQUE IOHEXOL  350 MG/ML SOLN COMPARISON:  Head CT earlier same day. FINDINGS: CTA NECK FINDINGS Aortic arch: Aortic atherosclerosis. Right carotid system: Common carotid artery widely patent to the bifurcation region. Soft and calcified plaque at the carotid bifurcation and ICA bulb. No measurable stenosis however. Cervical ICA widely  patent beyond bulb. Left carotid system: Common carotid artery shows intimal thickening but is widely patent to the bifurcation. There is occlusion of the ICA at the bifurcation. No reconstituted flow in the cervical region. ECA widely patent. Vertebral arteries: Right subclavian artery is widely patent. Right vertebral artery is widely patent through the cervical region. Left subclavian artery is occluded just beyond its origin, with reconstituted flow likely due to retrograde flow in the left vertebral artery. Skeleton: Negative Other neck: No mass or lymphadenopathy. Upper chest: Upper lungs are clear. Review of the MIP images confirms the above findings CTA HEAD FINDINGS Anterior circulation: Right internal carotid artery is patent through the skull base and siphon region. No antegrade flow in the left ICA at the skull base level. There is reconstituted flow in the siphon, probably with contributions from external to internal collaterals, patent anterior communicating artery and patent posterior communicating artery at least on the left. Right anterior and middle cerebral vessels appear widely patent. Left anterior and middle cerebral vessels show flow. No large or medium vessel occlusion is identified. Posterior circulation: Both vertebral arteries show flow at the foramen magnum with presumed retrograde flow on the left. Basilar artery appears normal. Superior cerebellar and posterior cerebral arteries appear normal. Venous sinuses: Patent and normal. Anatomic variants: None other significant. 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 Reconstituted flow in the left carotid siphon likely due to external to internal collaterals, patent anterior  communicating artery and patent posterior communicating artery on the left. No large or medium vessel intracranial occlusion is identified. 6 cc region of completed infarction at the left parietal vertex. 208 cc region of T-max greater than 6 seconds throughout the left MCA territory due to the reconstituted flow described above. 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 These results were called by telephone at the time of interpretation on 12/15/2019 at 12:04 am to provider Veryl Speak , who verbally acknowledged these results. Electronically Signed   By: Nelson Chimes M.D.   On: 12/15/2019 00:08   DG CHEST PORT 1 VIEW  Result Date: 12/15/2019 CLINICAL DATA:  Stroke, hypertension, MI EXAM: PORTABLE CHEST 1 VIEW COMPARISON:  Radiograph 09/12/2018 FINDINGS: Lung volumes are low with hazy interstitial opacities and cephalized, indistinct pulmonary vascularity. Cardiac silhouette is markedly enlarged from comparison exams. No visible pneumothorax or effusion. No focal consolidative opacity. No acute osseous or soft tissue abnormality. Telemetry leads overlie the chest. IMPRESSION: 1. Markedly enlarged cardiac silhouette from comparison exams. Could reflect cardiomegaly or pericardial effusion. 2. Additional features of likely vascular congestion and interstitial edema on a background of low volumes and atelectasis. Electronically Signed   By: Lovena Le M.D.   On: 12/15/2019 06:26   ECHOCARDIOGRAM COMPLETE  Result Date: 12/15/2019    ECHOCARDIOGRAM REPORT   Patient Name:   NOLON HANNEMAN Date of Exam: 12/15/2019 Medical Rec #:  [id number]  Height:       69.0 in Accession #:    [id number]      Weight:       289.9 lb Date of Birth:  Sep 19, 1971       BSA:          2.419 m Patient Age:    49 years        BP:           128/84 mmHg Patient Gender: M               HR:            62 bpm. Exam Location:  Inpatient Procedure: 2D Echo, Cardiac Doppler and Color Doppler Indications:    Stroke 434.91  History:        Patient has prior history of Echocardiogram examinations, most                 recent 01/22/2018. Stroke; Risk Factors:Current Smoker.  Sonographer:    Vickie Epley RDCS Referring Phys: Louise  1. Left ventricular ejection fraction, by estimation, is 55 to 60%. The left ventricle has normal function. Left ventricular endocardial border not optimally defined to evaluate regional wall motion. The left ventricular internal cavity size was mildly dilated. Left ventricular diastolic parameters are indeterminate. Elevated left ventricular end-diastolic pressure.  2. Right ventricular systolic function is normal. The right ventricular size is normal. Tricuspid regurgitation signal is inadequate for assessing PA pressure.  3. The mitral valve is normal in structure and function. No evidence of mitral valve regurgitation. No evidence of mitral stenosis.  4. The aortic valve is tricuspid. Aortic valve regurgitation is trivial. No aortic stenosis is present.  5. The inferior vena cava is normal in size with greater than 50% respiratory variability, suggesting right atrial pressure of 3 mmHg. FINDINGS  Left Ventricle: Left ventricular ejection fraction, by estimation, is 55 to 60%. The left ventricle has normal function. Left ventricular endocardial border not optimally defined to evaluate regional wall motion. The left ventricular internal cavity size was mildly dilated. There is no left ventricular hypertrophy. Left ventricular diastolic parameters are indeterminate. Elevated left ventricular end-diastolic pressure. Right Ventricle: The right ventricular size is normal. No increase in right ventricular wall thickness. Right ventricular systolic function is normal. Tricuspid regurgitation signal is inadequate for assessing PA pressure. Left Atrium: Left atrial size  was normal in size. Right Atrium: Right atrial size was normal in size. Pericardium: There is no evidence of pericardial effusion. Mitral Valve: The mitral valve is normal in structure and function. Normal mobility of the mitral valve leaflets. No evidence of mitral valve regurgitation. No evidence of mitral valve stenosis. Tricuspid Valve: The tricuspid valve is normal in structure. Tricuspid valve regurgitation is not demonstrated. No evidence of tricuspid stenosis. Aortic Valve: The aortic valve is tricuspid. Aortic valve regurgitation is trivial. No aortic stenosis is present. Pulmonic Valve: The pulmonic valve was normal in structure. Pulmonic valve regurgitation is not visualized. No evidence of pulmonic stenosis. Aorta: The aortic root is normal in size and structure. Venous: The inferior vena cava is normal in size with greater than 50% respiratory variability, suggesting right atrial pressure of 3 mmHg. IAS/Shunts: No atrial level shunt detected by color flow Doppler.  LEFT VENTRICLE PLAX 2D LVIDd:         6.10 cm      Diastology LVIDs:         4.80 cm      LV e'  lateral:   6.73 cm/s LV PW:         0.80 cm      LV E/e' lateral: 14.5 LV IVS:        0.80 cm      LV e' medial:    5.51 cm/s LVOT diam:     2.10 cm      LV E/e' medial:  17.7 LV SV:         61.31 ml LV SV Index:   25.34 LVOT Area:     3.46 cm  LV Volumes (MOD) LV vol d, MOD A2C: 151.0 ml LV vol d, MOD A4C: 173.0 ml LV vol s, MOD A2C: 88.8 ml LV vol s, MOD A4C: 89.5 ml LV SV MOD A2C:     62.2 ml LV SV MOD A4C:     173.0 ml LV SV MOD BP:      70.8 ml RIGHT VENTRICLE RV S prime:     10.90 cm/s TAPSE (M-mode): 1.8 cm LEFT ATRIUM             Index LA diam:        4.10 cm 1.69 cm/m LA Vol (A2C):   40.8 ml 16.87 ml/m LA Vol (A4C):   43.3 ml 17.90 ml/m LA Biplane Vol: 41.9 ml 17.32 ml/m  AORTIC VALVE LVOT Vmax:   85.00 cm/s LVOT Vmean:  55.100 cm/s LVOT VTI:    0.177 m  AORTA Ao Root diam: 3.20 cm MITRAL VALVE MV Area (PHT): 3.65 cm    SHUNTS MV  Decel Time: 208 msec    Systemic VTI:  0.18 m MV E velocity: 97.50 cm/s  Systemic Diam: 2.10 cm MV A velocity: 67.90 cm/s MV E/A ratio:  1.44 Fransico Him MD Electronically signed by Fransico Him MD Signature Date/Time: 12/15/2019/10:37:20 AM    Final    IR PERCUTANEOUS ART THROMBECTOMY/INFUSION INTRACRANIAL INC DIAG ANGIO  Result Date: 12/16/2019 INDICATION: Worsening neurological symptoms with dysarthria, right-sided numbness and weakness with difficulty with expression. EXAM: 1. EMERGENT LARGE VESSEL OCCLUSION THROMBOLYSIS anterior CIRCULATION) COMPARISON:  Diagnostic catheter arteriogram of December 14, 2019. MEDICATIONS: Ancef 2 g IV antibiotic was administered within 1 hour of the procedure. ANESTHESIA/SEDATION: General anesthesia. CONTRAST:  Isovue 300 approximately 60 mL. FLUOROSCOPY TIME:  Fluoroscopy Time: 52 minutes 30 seconds (2331 mGy). COMPLICATIONS: None immediate. TECHNIQUE: Following a full explanation of the procedure along with the potential associated complications, an informed witnessed consent was obtained patient and spouse. The risks of intracranial hemorrhage of 10%, worsening neurological deficit, ventilator dependency, death and inability to revascularize were all reviewed in detail with the patient's spouse. The patient was then put under general anesthesia by the Department of Anesthesiology at [facility]. The right groin was prepped and draped in the usual sterile fashion. Thereafter using modified Seldinger technique, transfemoral access into the right common femoral artery was obtained without difficulty. Over a 0.035 inch guidewire a 8 French 23 cm Brite tip sheath was inserted. Through this, and also over a 0.035 inch guidewire a 5 [place] JB 1 catheter was advanced to the aortic arch region and selectively positioned in the left common carotid artery and the left subclavian artery. FINDINGS: The left common carotid arteriogram continues to demonstrate angiographically  occluded left internal carotid artery at the bulb without evidence of a delayed string sign. Reconstitution of the distal left internal carotid artery and the cavernous and supraclinoid segment is again demonstrated from the left external carotid artery collaterals via the ophthalmic artery.  PROCEDURE: The diagnostic JB 1 catheter in the left internal carotid artery was exchanged over a 0.035 inch 300 cm Rosen exchange guidewire for a 95 cm 0.087 balloon guide catheter which had been prepped with 50% contrast and 50% heparinized saline infusion. The balloon guide was then advanced just proximal to the left common carotid artery bifurcation. The guidewire was removed. Good aspiration was obtained from the hub of the balloon guide catheter. Using biplane roadmap technique, initially an 014 inch standard Synchro micro guidewire was advanced inside of an 021 Trevo ProVue microcatheter. Multiple attempts were made using a torque device to advance and access the occluded left internal carotid artery proximally without success. The wire was then replaced with an 016 Fathom micro guidewire. Again with the microcatheter tip right against the wall of the occluded left internal carotid artery at the bulb, and with the balloon guide advanced to abut the occluded left internal carotid artery at the bulb, multiple attempts were made to access the occluded left internal carotid artery without success. This system was then replaced with a 5 French 125 cm slip cath. This was then advanced over a 0.0035 Roadrunner guidewire and directed against the wall of the left internal carotid artery at the bulb. Multiple attempts were then made to access the occluded left internal carotid artery initially with an 035 inch Roadrunner wire, and a glide guidewire without success. Finally attempts were made to advance the curved end of the slip cath into the occluded left internal carotid artery at the bulb again without success. The balloon guide  was then removed. Over a 0.035 inch Roadrunner guidewire, a JB 1 5 French diagnostic catheter was then advanced and positioned in the occluded left subclavian artery. Wire manipulation and catheter manipulation combination was then performed using a 0.035 inch Roadrunner guidewire, a glide wire, and finally an 016 Fathom micro guidewire without success. The diagnostic catheter was then retrieved and removed. The 8 French Brite tip 23 cm sheath was then removed with the successful application of a 7 [place] ExoSeal closure device. The right groin appeared soft. Distal pulses remained Dopplerable in the dorsalis pedis, and the posterior tibial regions bilaterally unchanged. A flat panel CT of the brain revealed no evidence of mass effect, midline shift or of intracranial hemorrhage. The patient's general anesthesia was then reversed and the patient was then extubated without difficulty. Upon recovery, the patient was able to moved all fours equally, and also responded appropriately. The patient was then returned to neuro ICU in stable condition for further management. IMPRESSION: Status post unsuccessful attempt at recanalization of the left internal carotid artery proximally, and also of the left subclavian artery at its proximal occlusion. PLAN: Follow-up in the clinic 4 weeks post discharge. Electronically Signed   By: Luanne Bras M.D.   On: 12/15/2019 12:10   CT HEAD CODE STROKE WO CONTRAST  Result Date: 12/14/2019 CLINICAL DATA:  Code stroke. Slurred speech and right-sided facial droop EXAM: CT HEAD WITHOUT CONTRAST TECHNIQUE: Contiguous axial images were obtained from the base of the skull through the vertex without intravenous contrast. COMPARISON:  None. FINDINGS: Brain: No acute hemorrhage. There is an area hypoattenuation extending to the cortex of the superior left parietal lobe. There is no midline shift or other mass effect. No hydrocephalus. Vascular: Mild bilateral carotid atherosclerosis. No  hyperdense vessel. Skull: Normal. Negative for fracture or focal lesion. Sinuses/Orbits: No acute finding. Other: None. ASPECTS [facility] Stroke Program Early CT Score) - Ganglionic level infarction (caudate, lentiform  nuclei, internal capsule, insula, M1-M3 cortex): 7 - Supraganglionic infarction (M4-M6 cortex): 2 Total score (0-10 with 10 being normal): 9 IMPRESSION: 1. No intracranial hemorrhage. 2. 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 3. ASPECTS is 9. These results were called by telephone at the time of interpretation on 12/14/2019 at 11:21 pm to provider Veryl Speak , who verbally acknowledged these results. Electronically Signed   By: Ulyses Jarred M.D.   On: 12/14/2019 23:22   IR ANGIO VERTEBRAL SEL VERTEBRAL UNI R MOD SED  Result Date: 12/15/2019 Study Result CLINICAL DATA:  New onset of right-sided arm and leg numbness associated with episodes of intermittent dysarthria and difficulty finding words, right upper extremity weakness, and dizziness.  EXAM: IR ANGIO EXTERNAL CAROTID SEL EXT CAROTID BILAT MOD SED  COMPARISON:  CT angiogram of the head and neck.  MEDICATIONS: Heparin 0 units IV; no antibiotic was administered within 1 hour of the procedure.  ANESTHESIA/SEDATION: Versed 1 mg IV; Fentanyl 100 mcg IV  Moderate Sedation Time:  36 minutes  The patient was continuously monitored during the procedure by the interventional radiology nurse under my direct supervision.  CONTRAST:  Isovue 300 approximately 60 mL.  FLUOROSCOPY TIME:  Fluoroscopy Time: 9 minutes 36 seconds (1894 mGy).  COMPLICATIONS: None immediate.  TECHNIQUE: Informed written consent was obtained from the patient after a thorough discussion of the procedural risks, benefits and alternatives. All questions were addressed. Maximal Sterile Barrier Technique was utilized including caps, mask, sterile gowns, sterile gloves, sterile drape, hand hygiene and skin antiseptic. A timeout was performed prior to  the initiation of the procedure.  The right groin was prepped and draped in the usual sterile fashion. Thereafter using modified Seldinger technique, transfemoral access into the right common femoral artery was obtained without difficulty. Over a 0.035 inch guidewire, a 5 French Pinnacle sheath was inserted. Through this, and also over 0.035 inch guidewire, a 5 [place] JB 1 catheter was advanced to the aortic arch region and selectively positioned in the right common carotid artery, the right vertebral artery, the left common carotid artery and the left subclavian artery.  FINDINGS: The innominate artery angiogram demonstrates the origins of the right subclavian artery and the right common carotid artery to be widely patent.  The right common carotid arteriogram demonstrates mild atherosclerotic related narrowing at the origin of the right external carotid artery. Its branches opacify normally.  The right internal carotid artery at the bulb demonstrates mild stenosis with a partially calcified plaque without evidence of intraluminal filling defects or of ulcerations.  At the level of C2 there is a fine linear band seen best on the lateral projection felt to be extraluminal.  Mild tortuosity is seen of the mid right ICA segment. More distally, the right internal carotid artery is seen to opacify to the cranial skull base. The petrous, cavernous and supraclinoid segments are widely patent.  The right middle cerebral artery and the right anterior cerebral artery opacify into the capillary and venous phases.  Prompt opacification via the anterior communicating artery of the left anterior cerebral A2 segment, the left anterior cerebral A1 hypoplastic segment, and the left middle cerebral artery is seen with mixing of unopacified blood from the left supraclinoid ICA.  The origin of the right vertebral artery is widely patent.  The vessel is seen to opacify to the cranial skull base. Wide patency is seen of the  right vertebrobasilar junction and the right posterior-inferior cerebellar artery.  The basilar artery, the  posterior cerebral arteries, the superior cerebellar arteries and the anterior-inferior cerebellar arteries demonstrate wide patency into the capillary and venous phases. However, prompt retrograde opacification via the left posterior communicating artery of the left middle cerebral artery territory is seen. Also demonstrated is retrograde opacification of the left vertebrobasilar junction opacifying the left posteroinferior cerebellar artery and retrogradely opacifying the left vertebral artery to the level of the left subclavian artery, consistent with left subclavian steal.  The left common carotid arteriogram demonstrates the left external carotid artery origin and its branches to be widely patent.  The left internal carotid artery at the bulb demonstrates complete angiographic occlusion without evidence of a delayed string sign angiographically.  More distally, there is reconstitution of the caval cavernous segment of the left internal carotid artery from the left external carotid artery branches via the ophthalmic artery.  There is mild narrowing of the supraclinoid left ICA.  Prompt appearance of the left middle cerebral artery proximally and of the superior and inferior divisions is seen with mixing of unopacified blood from the right internal carotid artery injection via the anterior communicating artery. Mild stenosis of the superior division of the left middle cerebral artery is noted.  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   Partial retrograde opacification of the left middle cerebral artery and the left anterior cerebral artery via the anterior communicating artery from the right internal carotid artery injection.  Collateral support to the left anterior and the left middle cerebral artery distribution from the vertebral artery from the right vertebral artery via the left posterior communicating artery.  Prompt retrograde opacification of the left vertebrobasilar junction to the left subclavian artery consistent with subclavian steal.  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  PLAN: Follow-up in the clinic 4 weeks post discharge.   Electronically Signed   By: Luanne Bras M.D.   On: 12/15/2019 11:41    IR ANGIO EXTERNAL CAROTID SEL EXT CAROTID BILAT MOD SED  Result Date: 12/16/2019 CLINICAL DATA:  New onset of right-sided arm and leg numbness associated with episodes of intermittent dysarthria and difficulty finding words, right upper extremity weakness, and dizziness. EXAM: IR ANGIO EXTERNAL CAROTID SEL EXT CAROTID BILAT MOD SED COMPARISON:  CT angiogram of the head and neck. MEDICATIONS: Heparin 0 units IV; no antibiotic was administered within 1 hour of the procedure. ANESTHESIA/SEDATION: Versed 1 mg IV; Fentanyl 100 mcg IV Moderate Sedation Time:  36 minutes The patient was continuously monitored during the procedure by the interventional radiology nurse under my direct supervision. CONTRAST:  Isovue 300 approximately 60 mL. FLUOROSCOPY TIME:  Fluoroscopy Time: 9 minutes 36 seconds (1894 mGy). COMPLICATIONS: None immediate. TECHNIQUE: Informed written consent was obtained from the patient after a thorough discussion of the procedural risks, benefits and alternatives. All questions were addressed. Maximal Sterile Barrier Technique was utilized including caps, mask, sterile gowns, sterile gloves, sterile drape, hand hygiene and skin antiseptic. A timeout was performed prior to the  initiation of the procedure. The right groin was prepped and draped in the usual sterile fashion. Thereafter using modified Seldinger technique, transfemoral access into the right common femoral artery was obtained without difficulty. Over a 0.035 inch guidewire, a 5  French Pinnacle sheath was inserted. Through this, and also over 0.035 inch guidewire, a 5 [place] JB 1 catheter was advanced to the aortic arch region and selectively positioned in the right common carotid artery, the right vertebral artery, the left common carotid artery and the left subclavian artery. FINDINGS: The innominate artery angiogram demonstrates the origins of the right subclavian artery and the right common carotid artery to be widely patent. The right common carotid arteriogram demonstrates mild atherosclerotic related narrowing at the origin of the right external carotid artery. Its branches opacify normally. The right internal carotid artery at the bulb demonstrates mild stenosis with a partially calcified plaque without evidence of intraluminal filling defects or of ulcerations. At the level of C2 there is a fine linear band seen best on the lateral projection felt to be extraluminal. Mild tortuosity is seen of the mid right ICA segment. More distally, the right internal carotid artery is seen to opacify to the cranial skull base. The petrous, cavernous and supraclinoid segments are widely patent. The right middle cerebral artery and the right anterior cerebral artery opacify into the capillary and venous phases. Prompt opacification via the anterior communicating artery of the left anterior cerebral A2 segment, the left anterior cerebral A1 hypoplastic segment, and the left middle cerebral artery is seen with mixing of unopacified blood from the left supraclinoid ICA. The origin of the right vertebral artery is widely patent. The vessel is seen to opacify to the cranial skull base. Wide patency is seen of the right vertebrobasilar  junction and the right posterior-inferior cerebellar artery. The basilar artery, the posterior cerebral arteries, the superior cerebellar arteries and the anterior-inferior cerebellar arteries demonstrate wide patency into the capillary and venous phases. However, prompt retrograde opacification via the left posterior communicating artery of the left middle cerebral artery territory is seen. Also demonstrated is retrograde opacification of the left vertebrobasilar junction opacifying the left posteroinferior cerebellar artery and retrogradely opacifying the left vertebral artery to the level of the left subclavian artery, consistent with left subclavian steal. The left common carotid arteriogram demonstrates the left external carotid artery origin and its branches to be widely patent. The left internal carotid artery at the bulb demonstrates complete angiographic occlusion without evidence of a delayed string sign angiographically. More distally, there is reconstitution of the caval cavernous segment of the left internal carotid artery from the left external carotid artery branches via the ophthalmic artery. There is mild narrowing of the supraclinoid left ICA. Prompt appearance of the left middle cerebral artery proximally and of the superior and inferior divisions is seen with mixing of unopacified blood from the right internal carotid artery injection via the anterior communicating artery. Mild stenosis of the superior division of the left middle cerebral artery is noted. 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 Partial retrograde opacification of the  left middle cerebral artery and the left anterior cerebral artery via the anterior communicating artery from the right internal carotid artery injection. Collateral support to the left anterior and the left middle cerebral artery distribution from the vertebral artery from the right vertebral artery via the left posterior communicating artery. Prompt retrograde opacification of the left vertebrobasilar junction to the left subclavian artery consistent  with subclavian steal. 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 PLAN: Follow-up in the clinic 4 weeks post discharge. Electronically Signed   By: Luanne Bras M.D.   On: 12/15/2019 11:41    Labs:  CBC: Recent Labs    10/31/19 1432 12/14/19 2321 12/14/19 2340 12/15/19 0357 12/16/19 0530  WBC 9.3 11.9*  --  10.3 10.0  HGB 16.5 16.0 15.6 16.0 14.2  HCT 47.5 47.4 46.0 46.1 40.8  PLT 172 198  --  184 153    COAGS: Recent Labs    12/14/19 2321  INR 1.0  APTT 29    BMP: Recent Labs    10/31/19 1432 10/31/19 1432 12/14/19 2321 12/14/19 2340 12/15/19 0357 12/16/19 0530  NA 143  --  139 139 137 139  K 4.1   < > 3.5 3.4* 4.0 3.4*  CL 101   < > 102 104 102 107  CO2 26  --  28  --  25 21*  GLUCOSE 101*  --  151* 149* 110* 110*  BUN 16  --  13 11 10 7   CALCIUM 9.6  --  8.9  --  8.8* 8.7*  CREATININE 0.90   < > 0.89 0.80 0.94 0.75  GFRNONAA 99  --  >60  --  >60 >60  GFRAA 115  --  >60  --  >60 >60   < > = values in this interval not displayed.    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 Estanislado Pandy. He later developed worsening symptoms that same day and he underwent a second diagnostic cerebral arteriogram with unsuccessful revascularization of left ICA secondary to consolidated left ICA clot.   Patient seen today for follow up with Dr. Estanislado Pandy - he denies any residual numbness, tingling or weakness on his right side. Patient spontaneously moving all extremities on exam today, currently laying flat per neurology recommendation. Ok to sit up from [place] but this should be confirmed with neurology. Right CFA puncture site clean, dry, dressed appropriately - nontender, soft, no obvious hematoma.  Further plans per neurology - patient will follow up with [facility] in approximately 2 weeks, I have placed an order to facilitate this and I have also placed out contact information in patient's AVS should he have any questions or concerns prior to his appointment.  [facility] remains available - please call with questions or concerns.   Electronically Signed: Joaquim Nam, PA-C 12/16/2019, 9:27 AM   I spent a total of 15 Minutes at the the patient's bedside AND on the patient's hospital floor or unit, greater than 50% of which was counseling/coordinating care for follow up dx cerebral angiogram/left ICA occlusion.

## 2019-12-16 NOTE — Progress Notes (Signed)
STROKE TEAM PROGRESS NOTE   INTERVAL HISTORY Patient sitting in chair, neurologically stable.  Worked with PT/OT, had mild orthostatic hypotension but asymptomatic.  Off Levophed, still on IV fluid, finished albumin.  Currently BP stable around 140.  Vitals:   12/16/19 0700 12/16/19 0715 12/16/19 0800 12/16/19 0926  BP: (!) 157/78 (!) 157/70  (!) 161/92  Pulse: (!) 54 74  73  Resp: 16 (!) 27    Temp:   99.7 F (37.6 C)   TempSrc:   Oral   SpO2: 91% 97%    Weight:        CBC:  Recent Labs  Lab 12/14/19 2321 12/14/19 2340 12/15/19 0357 12/16/19 0530  WBC 11.9*   < > 10.3 10.0  NEUTROABS 5.9  --  4.9  --   HGB 16.0   < > 16.0 14.2  HCT 47.4   < > 46.1 40.8  MCV 93.9   < > 92.0 91.9  PLT 198   < > 184 153   < > = values in this interval not displayed.    Basic Metabolic Panel:  Recent Labs  Lab 12/15/19 0357 12/16/19 0530  NA 137 139  K 4.0 3.4*  CL 102 107  CO2 25 21*  GLUCOSE 110* 110*  BUN 10 7  CREATININE 0.94 0.75  CALCIUM 8.8* 8.7*   Lipid Panel:     Component Value Date/Time   CHOL 155 12/15/2019 0344   CHOL 215 (H) 12/04/2019 1312   TRIG 152 (H) 12/15/2019 0344   HDL 32 (L) 12/15/2019 0344   HDL 42 12/04/2019 1312   CHOLHDL 4.8 12/15/2019 0344   VLDL 30 12/15/2019 0344   LDLCALC 93 12/15/2019 0344   LDLCALC 146 (H) 12/04/2019 1312   HgbA1c:  Lab Results  Component Value Date   HGBA1C 6.1 (H) 12/15/2019   Urine Drug Screen: No results found for: LABOPIA, COCAINSCRNUR, LABBENZ, AMPHETMU, THCU, LABBARB  Alcohol Level     Component Value Date/Time   ETH <10 12/14/2019 2321    IMAGING past 48 hours CT Angio Head W or Wo Contrast  Result Date: 12/15/2019 CLINICAL DATA:  Slurred speech.  Right-sided facial droop EXAM: CT ANGIOGRAPHY HEAD AND NECK CT PERFUSION BRAIN TECHNIQUE: Multidetector CT imaging of the head and neck was performed using the standard protocol during bolus administration of intravenous contrast. Multiplanar CT image  reconstructions and MIPs were obtained to evaluate the vascular anatomy. Carotid stenosis measurements (when applicable) are obtained utilizing NASCET criteria, using the distal internal carotid diameter as the denominator. Multiphase CT imaging of the brain was performed following IV bolus contrast injection. Subsequent parametric perfusion maps were calculated using RAPID software. CONTRAST:  169mL OMNIPAQUE IOHEXOL 350 MG/ML SOLN COMPARISON:  Head CT earlier same day. FINDINGS: CTA NECK FINDINGS Aortic arch: Aortic atherosclerosis. Right carotid system: Common carotid artery widely patent to the bifurcation region. Soft and calcified plaque at the carotid bifurcation and ICA bulb. No measurable stenosis however. Cervical ICA widely patent beyond bulb. Left carotid system: Common carotid artery shows intimal thickening but is widely patent to the bifurcation. There is occlusion of the ICA at the bifurcation. No reconstituted flow in the cervical region. ECA widely patent. Vertebral arteries: Right subclavian artery is widely patent. Right vertebral artery is widely patent through the cervical region. Left subclavian artery is occluded just beyond its origin, with reconstituted flow likely due to retrograde flow in the left vertebral artery. Skeleton: Negative Other neck: No mass or lymphadenopathy. Upper chest:  Upper lungs are clear. Review of the MIP images confirms the above findings CTA HEAD FINDINGS Anterior circulation: Right internal carotid artery is patent through the skull base and siphon region. No antegrade flow in the left ICA at the skull base level. There is reconstituted flow in the siphon, probably with contributions from external to internal collaterals, patent anterior communicating artery and patent posterior communicating artery at least on the left. Right anterior and middle cerebral vessels appear widely patent. Left anterior and middle cerebral vessels show flow. No large or medium vessel  occlusion is identified. Posterior circulation: Both vertebral arteries show flow at the foramen magnum with presumed retrograde flow on the left. Basilar artery appears normal. Superior cerebellar and posterior cerebral arteries appear normal. Venous sinuses: Patent and normal. Anatomic variants: None other significant. 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 Reconstituted flow in the left carotid siphon likely due to external to internal collaterals, patent anterior communicating artery and patent posterior communicating artery on the left. No large or medium vessel intracranial occlusion is identified. 6 cc region of completed infarction at the left parietal vertex. 208 cc region of T-max greater than 6 seconds throughout the left MCA territory due to the reconstituted flow described above. 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 These results were called by telephone at the time of interpretation on 12/15/2019 at 12:04 am to provider Veryl Speak , who verbally acknowledged these results. Electronically Signed   By: Nelson Chimes M.D.   On: 12/15/2019 00:08   CT HEAD WO CONTRAST  Result Date: 12/15/2019 CLINICAL DATA:  Subacute neuro deficit EXAM: CT HEAD WITHOUT CONTRAST TECHNIQUE: Contiguous axial images were obtained from the base of the skull through the vertex without intravenous contrast. COMPARISON:  Yesterday FINDINGS: Brain: 2 small areas of cytotoxic edema along the left parietal and superior frontal convexities, mildly progressed from before. No hemorrhage, hydrocephalus, or midline shift.  Vascular: There is still circulating intravascular contrast. No vessel asymmetry. 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 No hemorrhagic conversion. Electronically Signed   By: Monte Fantasia M.D.   On: 12/15/2019 04:52   CT Angio Neck W and/or Wo Contrast  Result Date: 12/15/2019 CLINICAL DATA:  Slurred speech.  Right-sided facial droop EXAM: CT ANGIOGRAPHY HEAD AND NECK CT PERFUSION BRAIN TECHNIQUE: Multidetector CT imaging of the head and neck was performed using the standard protocol during bolus administration of intravenous contrast. Multiplanar CT image reconstructions and MIPs were obtained to evaluate the vascular anatomy. Carotid stenosis measurements (when applicable) are obtained utilizing NASCET criteria, using the distal internal carotid diameter as the denominator. Multiphase CT imaging of the brain was performed following IV bolus contrast injection. Subsequent parametric perfusion maps were calculated using RAPID software. CONTRAST:  129mL OMNIPAQUE IOHEXOL 350 MG/ML SOLN COMPARISON:  Head CT earlier same day. FINDINGS: CTA NECK FINDINGS Aortic arch: Aortic atherosclerosis. Right carotid system: Common carotid artery widely patent to the bifurcation region. Soft and calcified plaque at the carotid bifurcation and ICA bulb. No measurable stenosis however. Cervical ICA widely patent beyond bulb. Left carotid system: Common carotid artery shows intimal thickening but is  widely patent to the bifurcation. There is occlusion of the ICA at the bifurcation. No reconstituted flow in the cervical region. ECA widely patent. Vertebral arteries: Right subclavian artery is widely patent. Right vertebral artery is widely patent through the cervical region. Left subclavian artery is occluded just beyond its origin, with reconstituted flow likely due to retrograde flow in the left vertebral artery. Skeleton:  Negative Other neck: No mass or lymphadenopathy. Upper chest: Upper lungs are clear. Review of the MIP images confirms the above findings CTA HEAD FINDINGS Anterior circulation: Right internal carotid artery is patent through the skull base and siphon region. No antegrade flow in the left ICA at the skull base level. There is reconstituted flow in the siphon, probably with contributions from external to internal collaterals, patent anterior communicating artery and patent posterior communicating artery at least on the left. Right anterior and middle cerebral vessels appear widely patent. Left anterior and middle cerebral vessels show flow. No large or medium vessel occlusion is identified. Posterior circulation: Both vertebral arteries show flow at the foramen magnum with presumed retrograde flow on the left. Basilar artery appears normal. Superior cerebellar and posterior cerebral arteries appear normal. Venous sinuses: Patent and normal. Anatomic variants: None other significant. 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 Reconstituted flow in the left carotid siphon likely due to external to internal collaterals, patent anterior communicating artery and patent posterior communicating artery on the left. No large or medium vessel intracranial occlusion is identified. 6 cc region of completed infarction at the left parietal vertex. 208 cc region of T-max greater than 6 seconds throughout the left MCA territory due to the reconstituted flow described above. 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 These results  were called by telephone at the time of interpretation on 12/15/2019 at 12:04 am to provider Veryl Speak , who verbally acknowledged these results. Electronically Signed   By: Nelson Chimes M.D.   On: 12/15/2019 00:08   IR Angiogram Extremity Left  Result Date: 12/15/2019 INDICATION: Worsening neurological symptoms with dysarthria, right-sided numbness and weakness with difficulty with expression.  EXAM: 1. EMERGENT LARGE VESSEL OCCLUSION THROMBOLYSIS anterior CIRCULATION)  COMPARISON:  Diagnostic catheter arteriogram of December 14, 2019.  MEDICATIONS: Ancef 2 g IV antibiotic was administered within 1 hour of the procedure.  ANESTHESIA/SEDATION: General anesthesia.  CONTRAST:  Isovue 300 approximately 60 mL.  FLUOROSCOPY TIME:  Fluoroscopy Time: 52 minutes 30 seconds (2331 mGy).  COMPLICATIONS: None immediate.  TECHNIQUE: Following a full explanation of the procedure along with the potential associated complications, an informed witnessed consent was obtained patient and spouse. The risks of intracranial hemorrhage of 10%, worsening neurological deficit, ventilator dependency, death and inability to revascularize were all reviewed in detail with the patient's spouse.  The patient was then put under general anesthesia by the Department of Anesthesiology at [facility].  The right groin was prepped and draped in the usual sterile fashion. Thereafter using modified Seldinger technique, transfemoral access into the right common femoral artery was obtained without difficulty. Over a 0.035 inch guidewire a 8 French 23 cm Brite tip sheath was inserted. Through this, and also over a 0.035 inch guidewire a 5  [place] JB 1 catheter was advanced to the aortic arch region and selectively positioned in the left common carotid artery and the left subclavian artery.  FINDINGS: The left common carotid arteriogram continues to demonstrate angiographically occluded left internal carotid artery at the bulb without  evidence of a delayed string sign.  Reconstitution of the distal left internal carotid artery and the cavernous and supraclinoid segment is again demonstrated from the left external carotid artery collaterals via the ophthalmic artery.  PROCEDURE: The diagnostic JB 1 catheter in the left internal carotid artery was exchanged over a 0.035 inch 300 cm Rosen exchange guidewire for a 95 cm 0.087 balloon guide catheter which had been prepped with 50% contrast and 50% heparinized saline infusion.  The balloon guide was then advanced just proximal to the left common carotid artery bifurcation. The guidewire was removed. Good aspiration was obtained from the hub of the balloon guide catheter. Using biplane roadmap technique, initially an 014 inch standard Synchro micro guidewire was advanced inside of an 021 Trevo ProVue microcatheter.  Multiple attempts were made using a torque device to advance and access the occluded left internal carotid artery proximally without success.  The wire was then replaced with an 016 Fathom micro guidewire.  Again with the microcatheter tip right against the wall of the occluded left internal carotid artery at the bulb, and with the balloon guide advanced to abut the occluded left internal carotid artery at the bulb, multiple attempts were made to access the occluded left internal carotid artery without success.  This system was then replaced with a 5 French 125 cm slip cath. This was then advanced over a 0.0035 Roadrunner guidewire and directed against the wall of the left internal carotid artery at the bulb.  Multiple attempts were then made to access the occluded left internal carotid artery initially with an 035 inch Roadrunner wire, and a glide guidewire without success.  Finally attempts were made to advance the curved end of the slip cath into the occluded left internal carotid artery at the bulb again without success. The balloon guide was then removed.  Over a 0.035 inch  Roadrunner guidewire, a JB 1 5 French diagnostic catheter was then advanced and positioned in the occluded left subclavian artery.  Wire manipulation and catheter manipulation combination was then performed using a 0.035 inch Roadrunner guidewire, a glide wire, and finally an 016 Fathom micro guidewire without success.  The diagnostic catheter was then retrieved and removed.  The 8 French Brite tip 23 cm sheath was then removed with the successful application of a 7 [place] ExoSeal closure device.  The right groin appeared soft. Distal pulses remained Dopplerable in the dorsalis pedis, and the posterior tibial regions bilaterally unchanged.  A flat panel CT of the brain revealed no evidence of mass effect, midline shift or of intracranial hemorrhage.  The patient's general anesthesia was then reversed and the patient was then extubated without difficulty. Upon recovery, the patient was able to moved all fours equally, and also responded appropriately.  The patient was then returned to neuro ICU in stable condition for further management.  IMPRESSION: Status post unsuccessful attempt at recanalization of the left internal carotid artery proximally, and also of the left subclavian artery at its proximal occlusion.  PLAN: Follow-up in the clinic 4 weeks post discharge.   Electronically Signed   By: Luanne Bras M.D.   On: 12/15/2019 12:10   IR Angiogram Extremity Left  Result Date: 12/15/2019 Study Result CLINICAL DATA:  New onset of right-sided arm and leg numbness associated with episodes of intermittent dysarthria and difficulty finding words, right upper extremity weakness, and dizziness.  EXAM: IR ANGIO EXTERNAL CAROTID SEL EXT CAROTID BILAT MOD SED  COMPARISON:  CT angiogram of the head and neck.  MEDICATIONS: Heparin 0 units IV; no antibiotic was administered within 1 hour of the procedure.  ANESTHESIA/SEDATION: Versed 1 mg IV; Fentanyl 100 mcg IV  Moderate Sedation Time:  36 minutes   The patient was continuously monitored during the procedure by the interventional radiology nurse under my direct supervision.  CONTRAST:  Isovue 300 approximately 60 mL.  FLUOROSCOPY TIME:  Fluoroscopy Time: 9 minutes 36 seconds (1894 mGy).  COMPLICATIONS: None immediate.  TECHNIQUE: Informed written consent was obtained from the patient after a thorough discussion of the procedural risks, benefits and alternatives. All questions were addressed. Maximal Sterile Barrier Technique was utilized including caps, mask, sterile gowns, sterile gloves, sterile drape, hand hygiene and skin antiseptic. A timeout was performed prior to the initiation of the procedure.  The right groin was prepped and draped in the usual sterile fashion. Thereafter using modified Seldinger technique, transfemoral access into the right common femoral artery was obtained without difficulty. Over a 0.035 inch guidewire, a 5 French Pinnacle sheath was inserted. Through this, and also over 0.035 inch guidewire, a 5 [place] JB 1 catheter was advanced to the aortic arch region and selectively positioned in the right common carotid artery, the right vertebral artery, the left common carotid artery and the left subclavian artery.  FINDINGS: The innominate artery angiogram demonstrates the origins of the right subclavian artery and the right common carotid artery to be widely patent.  The right common carotid arteriogram demonstrates mild atherosclerotic related narrowing at the origin of the right external carotid artery. Its branches opacify normally.  The right internal carotid artery at the bulb demonstrates mild stenosis with a partially calcified plaque without evidence of intraluminal filling defects or of ulcerations.  At the level of C2 there is a fine linear band seen best on the lateral projection felt to be extraluminal.  Mild tortuosity is seen of the mid right ICA segment. More distally, the right internal carotid artery is seen to  opacify to the cranial skull base. The petrous, cavernous and supraclinoid segments are widely patent.  The right middle cerebral artery and the right anterior cerebral artery opacify into the capillary and venous phases.  Prompt opacification via the anterior communicating artery of the left anterior cerebral A2 segment, the left anterior cerebral A1 hypoplastic segment, and the left middle cerebral artery is seen with mixing of unopacified blood from the left supraclinoid ICA.  The origin of the right vertebral artery is widely patent.  The vessel is seen to opacify to the cranial skull base. Wide patency is seen of the right vertebrobasilar junction and the right posterior-inferior cerebellar artery.  The basilar artery, the posterior cerebral arteries, the superior cerebellar arteries and the anterior-inferior cerebellar arteries demonstrate wide patency into the capillary and venous phases. However, prompt retrograde opacification via the left posterior communicating artery of the left middle cerebral artery territory is seen. Also demonstrated is retrograde opacification of the left vertebrobasilar junction opacifying the left posteroinferior cerebellar artery and retrogradely opacifying the left vertebral artery to the level of the left subclavian artery, consistent with left subclavian steal.  The left common carotid arteriogram demonstrates the left external carotid artery origin and its branches to be widely patent.  The left internal carotid  artery at the bulb demonstrates complete angiographic occlusion without evidence of a delayed string sign angiographically.  More distally, there is reconstitution of the caval cavernous segment of the left internal carotid artery from the left external carotid artery branches via the ophthalmic artery.  There is mild narrowing of the supraclinoid left ICA.  Prompt appearance of the left middle cerebral artery proximally and of the superior and inferior  divisions is seen with mixing of unopacified blood from the right internal carotid artery injection via the anterior communicating artery. Mild stenosis of the superior division of the left middle cerebral artery is noted.  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  Partial retrograde opacification of the left middle cerebral artery and the left anterior cerebral artery via the anterior communicating artery from the right internal carotid artery injection.  Collateral support to the left anterior and the left middle cerebral artery distribution from the vertebral artery from the right vertebral artery via the left posterior communicating artery.  Prompt retrograde opacification of the left vertebrobasilar junction to the left subclavian artery consistent with subclavian steal.  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  PLAN: Follow-up in the clinic 4 weeks post discharge.   Electronically Signed   By: Luanne Bras M.D.   On: 12/15/2019 11:41    IR CT Head Ltd  Result Date: 12/15/2019 INDICATION: Worsening neurological symptoms with dysarthria, right-sided numbness and weakness with difficulty with expression.  EXAM: 1. EMERGENT LARGE VESSEL OCCLUSION THROMBOLYSIS anterior CIRCULATION)  COMPARISON:  Diagnostic catheter arteriogram of December 14, 2019.  MEDICATIONS: Ancef 2 g IV antibiotic was administered within 1 hour of the procedure.  ANESTHESIA/SEDATION: General anesthesia.  CONTRAST:  Isovue 300 approximately 60 mL.  FLUOROSCOPY TIME:  Fluoroscopy Time: 52 minutes 30 seconds (2331 mGy).   COMPLICATIONS: None immediate.  TECHNIQUE: Following a full explanation of the procedure along with the potential associated complications, an informed witnessed consent was obtained patient and spouse. The risks of intracranial hemorrhage of 10%, worsening neurological deficit, ventilator dependency, death and inability to revascularize were all reviewed in detail with the patient's spouse.  The patient was then put under general anesthesia by the Department of Anesthesiology at [facility].  The right groin was prepped and draped in the usual sterile fashion. Thereafter using modified Seldinger technique, transfemoral access into the right common femoral artery was obtained without difficulty. Over a 0.035 inch guidewire a 8 French 23 cm Brite tip sheath was inserted. Through this, and also over a 0.035 inch guidewire a 5 [place] JB 1 catheter was advanced to the aortic arch region and selectively positioned in the left common carotid artery and the left subclavian artery.  FINDINGS: The left common carotid arteriogram continues to demonstrate angiographically occluded left internal carotid artery at the bulb without evidence of a delayed string sign.  Reconstitution of the distal left internal carotid artery and the cavernous and supraclinoid segment is again demonstrated from the left external carotid artery collaterals via the ophthalmic artery.  PROCEDURE: The diagnostic JB 1 catheter in the left internal carotid artery was exchanged over a 0.035 inch 300 cm Rosen exchange guidewire for a 95 cm 0.087 balloon guide catheter  which had been prepped with 50% contrast and 50% heparinized saline infusion.  The balloon guide was then advanced just proximal to the left common carotid artery bifurcation. The guidewire was removed. Good aspiration was obtained from the hub of the balloon guide catheter. Using biplane roadmap technique, initially an 014 inch standard Synchro micro guidewire was advanced  inside of an 021 Trevo ProVue microcatheter.  Multiple attempts were made using a torque device to advance and access the occluded left internal carotid artery proximally without success.  The wire was then replaced with an 016 Fathom micro guidewire.  Again with the microcatheter tip right against the wall of the occluded left internal carotid artery at the bulb, and with the balloon guide advanced to abut the occluded left internal carotid artery at the bulb, multiple attempts were made to access the occluded left internal carotid artery without success.  This system was then replaced with a 5 French 125 cm slip cath. This was then advanced over a 0.0035 Roadrunner guidewire and directed against the wall of the left internal carotid artery at the bulb.  Multiple attempts were then made to access the occluded left internal carotid artery initially with an 035 inch Roadrunner wire, and a glide guidewire without success.  Finally attempts were made to advance the curved end of the slip cath into the occluded left internal carotid artery at the bulb again without success. The balloon guide was then removed.  Over a 0.035 inch Roadrunner guidewire, a JB 1 5 French diagnostic catheter was then advanced and positioned in the occluded left subclavian artery.  Wire manipulation and catheter manipulation combination was then performed using a 0.035 inch Roadrunner guidewire, a glide wire, and finally an 016 Fathom micro guidewire without success.  The diagnostic catheter was then retrieved and removed.  The 8 French Brite tip 23 cm sheath was then removed with the successful application of a 7 [place] ExoSeal closure device.  The right groin appeared soft. Distal pulses remained Dopplerable in the dorsalis pedis, and the posterior tibial regions bilaterally unchanged.  A flat panel CT of the brain revealed no evidence of mass effect, midline shift or of intracranial hemorrhage.  The patient's general anesthesia  was then reversed and the patient was then extubated without difficulty. Upon recovery, the patient was able to moved all fours equally, and also responded appropriately.  The patient was then returned to neuro ICU in stable condition for further management.  IMPRESSION: Status post unsuccessful attempt at recanalization of the left internal carotid artery proximally, and also of the left subclavian artery at its proximal occlusion.  PLAN: Follow-up in the clinic 4 weeks post discharge.   Electronically Signed   By: Luanne Bras M.D.   On: 12/15/2019 12:10   CT CEREBRAL PERFUSION W CONTRAST  Result Date: 12/15/2019 CLINICAL DATA:  Slurred speech.  Right-sided facial droop EXAM: CT ANGIOGRAPHY HEAD AND NECK CT PERFUSION BRAIN TECHNIQUE: Multidetector CT imaging of the head and neck was performed using the standard protocol during bolus administration of intravenous contrast. Multiplanar CT image reconstructions and MIPs were obtained to evaluate the vascular anatomy. Carotid stenosis measurements (when applicable) are obtained utilizing NASCET criteria, using the distal internal carotid diameter as the denominator. Multiphase CT imaging of the brain was performed following IV bolus contrast injection. Subsequent parametric perfusion maps were calculated using RAPID software. CONTRAST:  193mL OMNIPAQUE IOHEXOL 350 MG/ML SOLN COMPARISON:  Head CT earlier same day. FINDINGS: CTA NECK FINDINGS Aortic arch: Aortic atherosclerosis.  Right carotid system: Common carotid artery widely patent to the bifurcation region. Soft and calcified plaque at the carotid bifurcation and ICA bulb. No measurable stenosis however. Cervical ICA widely patent beyond bulb. Left carotid system: Common carotid artery shows intimal thickening but is widely patent to the bifurcation. There is occlusion of the ICA at the bifurcation. No reconstituted flow in the cervical region. ECA widely patent. Vertebral arteries: Right subclavian  artery is widely patent. Right vertebral artery is widely patent through the cervical region. Left subclavian artery is occluded just beyond its origin, with reconstituted flow likely due to retrograde flow in the left vertebral artery. Skeleton: Negative Other neck: No mass or lymphadenopathy. Upper chest: Upper lungs are clear. Review of the MIP images confirms the above findings CTA HEAD FINDINGS Anterior circulation: Right internal carotid artery is patent through the skull base and siphon region. No antegrade flow in the left ICA at the skull base level. There is reconstituted flow in the siphon, probably with contributions from external to internal collaterals, patent anterior communicating artery and patent posterior communicating artery at least on the left. Right anterior and middle cerebral vessels appear widely patent. Left anterior and middle cerebral vessels show flow. No large or medium vessel occlusion is identified. Posterior circulation: Both vertebral arteries show flow at the foramen magnum with presumed retrograde flow on the left. Basilar artery appears normal. Superior cerebellar and posterior cerebral arteries appear normal. Venous sinuses: Patent and normal. Anatomic variants: None other significant. 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 Reconstituted flow in the left carotid siphon likely due to external to internal collaterals, patent anterior communicating artery and patent posterior communicating artery on the left. No large or medium vessel intracranial occlusion is identified. 6 cc region of completed infarction at the left parietal vertex. 208 cc region of T-max greater than 6 seconds throughout the left MCA territory  due to the reconstituted flow described above. 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 These results were called by telephone at the time of interpretation on 12/15/2019 at 12:04 am to provider Veryl Speak , who verbally acknowledged these results. Electronically Signed   By: Nelson Chimes M.D.   On: 12/15/2019 00:08   DG CHEST PORT 1 VIEW  Result Date: 12/15/2019 CLINICAL DATA:  Stroke, hypertension, MI EXAM: PORTABLE CHEST 1 VIEW COMPARISON:  Radiograph 09/12/2018 FINDINGS: Lung volumes are low with hazy interstitial opacities and cephalized, indistinct pulmonary vascularity. Cardiac silhouette is markedly enlarged from comparison exams. No visible pneumothorax or effusion. No focal consolidative opacity. No acute osseous or soft tissue abnormality. Telemetry leads overlie the chest. IMPRESSION: 1. Markedly enlarged cardiac silhouette from comparison exams. Could reflect cardiomegaly or pericardial effusion. 2. Additional features of likely vascular congestion and interstitial edema on a background of low volumes and atelectasis. Electronically Signed   By: Lovena Le M.D.   On: 12/15/2019 06:26   ECHOCARDIOGRAM COMPLETE  Result Date: 12/15/2019    ECHOCARDIOGRAM REPORT   Patient Name:   SHERIFF AMESQUITA Date of Exam: 12/15/2019 Medical Rec #:  [id number]       Height:       69.0 in Accession #:  [id number]      Weight:       289.9 lb Date of Birth:  Apr 23, 1971       BSA:          2.419 m Patient Age:    14 years        BP:           128/84 mmHg Patient Gender: M               HR:           62 bpm. Exam Location:  Inpatient Procedure: 2D Echo, Cardiac Doppler and Color Doppler Indications:    Stroke 434.91  History:        Patient has prior history of Echocardiogram examinations, most                 recent 01/22/2018. Stroke; Risk Factors:Current Smoker.   Sonographer:    Vickie Epley RDCS Referring Phys: [place]  1. Left ventricular ejection fraction, by estimation, is 55 to 60%. The left ventricle has normal function. Left ventricular endocardial border not optimally defined to evaluate regional wall motion. The left ventricular internal cavity size was mildly dilated. Left ventricular diastolic parameters are indeterminate. Elevated left ventricular end-diastolic pressure.  2. Right ventricular systolic function is normal. The right ventricular size is normal. Tricuspid regurgitation signal is inadequate for assessing PA pressure.  3. The mitral valve is normal in structure and function. No evidence of mitral valve regurgitation. No evidence of mitral stenosis.  4. The aortic valve is tricuspid. Aortic valve regurgitation is trivial. No aortic stenosis is present.  5. The inferior vena cava is normal in size with greater than 50% respiratory variability, suggesting right atrial pressure of 3 mmHg. FINDINGS  Left Ventricle: Left ventricular ejection fraction, by estimation, is 55 to 60%. The left ventricle has normal function. Left ventricular endocardial border not optimally defined to evaluate regional wall motion. The left ventricular internal cavity size was mildly dilated. There is no left ventricular hypertrophy. Left ventricular diastolic parameters are indeterminate. Elevated left ventricular end-diastolic pressure. Right Ventricle: The right ventricular size is normal. No increase in right ventricular wall thickness. Right ventricular systolic function is normal. Tricuspid regurgitation signal is inadequate for assessing PA pressure. Left Atrium: Left atrial size was normal in size. Right Atrium: Right atrial size was normal in size. Pericardium: There is no evidence of pericardial effusion. Mitral Valve: The mitral valve is normal in structure and function. Normal mobility of the mitral valve leaflets. No evidence of mitral valve  regurgitation. No evidence of mitral valve stenosis. Tricuspid Valve: The tricuspid valve is normal in structure. Tricuspid valve regurgitation is not demonstrated. No evidence of tricuspid stenosis. Aortic Valve: The aortic valve is tricuspid. Aortic valve regurgitation is trivial. No aortic stenosis is present. Pulmonic Valve: The pulmonic valve was normal in structure. Pulmonic valve regurgitation is not visualized. No evidence of pulmonic stenosis. Aorta: The aortic root is normal in size and structure. Venous: The inferior vena cava is normal in size with greater than 50% respiratory variability, suggesting right atrial pressure of 3 mmHg. IAS/Shunts: No atrial level shunt detected by color flow Doppler.  LEFT VENTRICLE PLAX 2D LVIDd:         6.10 cm      Diastology LVIDs:         4.80 cm      LV e' lateral:   6.73 cm/s LV PW:  0.80 cm      LV E/e' lateral: 14.5 LV IVS:        0.80 cm      LV e' medial:    5.51 cm/s LVOT diam:     2.10 cm      LV E/e' medial:  17.7 LV SV:         61.31 ml LV SV Index:   25.34 LVOT Area:     3.46 cm  LV Volumes (MOD) LV vol d, MOD A2C: 151.0 ml LV vol d, MOD A4C: 173.0 ml LV vol s, MOD A2C: 88.8 ml LV vol s, MOD A4C: 89.5 ml LV SV MOD A2C:     62.2 ml LV SV MOD A4C:     173.0 ml LV SV MOD BP:      70.8 ml RIGHT VENTRICLE RV S prime:     10.90 cm/s TAPSE (M-mode): 1.8 cm LEFT ATRIUM             Index LA diam:        4.10 cm 1.69 cm/m LA Vol (A2C):   40.8 ml 16.87 ml/m LA Vol (A4C):   43.3 ml 17.90 ml/m LA Biplane Vol: 41.9 ml 17.32 ml/m  AORTIC VALVE LVOT Vmax:   85.00 cm/s LVOT Vmean:  55.100 cm/s LVOT VTI:    0.177 m  AORTA Ao Root diam: 3.20 cm MITRAL VALVE MV Area (PHT): 3.65 cm    SHUNTS MV Decel Time: 208 msec    Systemic VTI:  0.18 m MV E velocity: 97.50 cm/s  Systemic Diam: 2.10 cm MV A velocity: 67.90 cm/s MV E/A ratio:  1.44 Fransico Him MD Electronically signed by Fransico Him MD Signature Date/Time: 12/15/2019/10:37:20 AM    Final    IR PERCUTANEOUS ART  THROMBECTOMY/INFUSION INTRACRANIAL INC DIAG ANGIO  Result Date: 12/16/2019 INDICATION: Worsening neurological symptoms with dysarthria, right-sided numbness and weakness with difficulty with expression. EXAM: 1. EMERGENT LARGE VESSEL OCCLUSION THROMBOLYSIS anterior CIRCULATION) COMPARISON:  Diagnostic catheter arteriogram of December 14, 2019. MEDICATIONS: Ancef 2 g IV antibiotic was administered within 1 hour of the procedure. ANESTHESIA/SEDATION: General anesthesia. CONTRAST:  Isovue 300 approximately 60 mL. FLUOROSCOPY TIME:  Fluoroscopy Time: 52 minutes 30 seconds (2331 mGy). COMPLICATIONS: None immediate. TECHNIQUE: Following a full explanation of the procedure along with the potential associated complications, an informed witnessed consent was obtained patient and spouse. The risks of intracranial hemorrhage of 10%, worsening neurological deficit, ventilator dependency, death and inability to revascularize were all reviewed in detail with the patient's spouse. The patient was then put under general anesthesia by the Department of Anesthesiology at [facility]. The right groin was prepped and draped in the usual sterile fashion. Thereafter using modified Seldinger technique, transfemoral access into the right common femoral artery was obtained without difficulty. Over a 0.035 inch guidewire a 8 French 23 cm Brite tip sheath was inserted. Through this, and also over a 0.035 inch guidewire a 5 [place] JB 1 catheter was advanced to the aortic arch region and selectively positioned in the left common carotid artery and the left subclavian artery. FINDINGS: The left common carotid arteriogram continues to demonstrate angiographically occluded left internal carotid artery at the bulb without evidence of a delayed string sign. Reconstitution of the distal left internal carotid artery and the cavernous and supraclinoid segment is again demonstrated from the left external carotid artery collaterals via the  ophthalmic artery. PROCEDURE: The diagnostic JB 1 catheter in the left internal carotid artery was exchanged over  a 0.035 inch 300 cm Rosen exchange guidewire for a 95 cm 0.087 balloon guide catheter which had been prepped with 50% contrast and 50% heparinized saline infusion. The balloon guide was then advanced just proximal to the left common carotid artery bifurcation. The guidewire was removed. Good aspiration was obtained from the hub of the balloon guide catheter. Using biplane roadmap technique, initially an 014 inch standard Synchro micro guidewire was advanced inside of an 021 Trevo ProVue microcatheter. Multiple attempts were made using a torque device to advance and access the occluded left internal carotid artery proximally without success. The wire was then replaced with an 016 Fathom micro guidewire. Again with the microcatheter tip right against the wall of the occluded left internal carotid artery at the bulb, and with the balloon guide advanced to abut the occluded left internal carotid artery at the bulb, multiple attempts were made to access the occluded left internal carotid artery without success. This system was then replaced with a 5 French 125 cm slip cath. This was then advanced over a 0.0035 Roadrunner guidewire and directed against the wall of the left internal carotid artery at the bulb. Multiple attempts were then made to access the occluded left internal carotid artery initially with an 035 inch Roadrunner wire, and a glide guidewire without success. Finally attempts were made to advance the curved end of the slip cath into the occluded left internal carotid artery at the bulb again without success. The balloon guide was then removed. Over a 0.035 inch Roadrunner guidewire, a JB 1 5 French diagnostic catheter was then advanced and positioned in the occluded left subclavian artery. Wire manipulation and catheter manipulation combination was then performed using a 0.035 inch Roadrunner  guidewire, a glide wire, and finally an 016 Fathom micro guidewire without success. The diagnostic catheter was then retrieved and removed. The 8 French Brite tip 23 cm sheath was then removed with the successful application of a 7 [place] ExoSeal closure device. The right groin appeared soft. Distal pulses remained Dopplerable in the dorsalis pedis, and the posterior tibial regions bilaterally unchanged. A flat panel CT of the brain revealed no evidence of mass effect, midline shift or of intracranial hemorrhage. The patient's general anesthesia was then reversed and the patient was then extubated without difficulty. Upon recovery, the patient was able to moved all fours equally, and also responded appropriately. The patient was then returned to neuro ICU in stable condition for further management. IMPRESSION: Status post unsuccessful attempt at recanalization of the left internal carotid artery proximally, and also of the left subclavian artery at its proximal occlusion. PLAN: Follow-up in the clinic 4 weeks post discharge. Electronically Signed   By: Luanne Bras M.D.   On: 12/15/2019 12:10   CT HEAD CODE STROKE WO CONTRAST  Result Date: 12/14/2019 CLINICAL DATA:  Code stroke. Slurred speech and right-sided facial droop EXAM: CT HEAD WITHOUT CONTRAST TECHNIQUE: Contiguous axial images were obtained from the base of the skull through the vertex without intravenous contrast. COMPARISON:  None. FINDINGS: Brain: No acute hemorrhage. There is an area hypoattenuation extending to the cortex of the superior left parietal lobe. There is no midline shift or other mass effect. No hydrocephalus. Vascular: Mild bilateral carotid atherosclerosis. No hyperdense vessel. Skull: Normal. Negative for fracture or focal lesion. Sinuses/Orbits: No acute finding. Other: None. ASPECTS [facility] Stroke Program Early CT Score) - Ganglionic level infarction (caudate, lentiform nuclei, internal capsule, insula, M1-M3 cortex): 7 -  Supraganglionic infarction (M4-M6 cortex): 2 Total score (  0-10 with 10 being normal): 9 IMPRESSION: 1. No intracranial hemorrhage. 2. 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 3. ASPECTS is 9. These results were called by telephone at the time of interpretation on 12/14/2019 at 11:21 pm to provider Veryl Speak , who verbally acknowledged these results. Electronically Signed   By: Ulyses Jarred M.D.   On: 12/14/2019 23:22   IR ANGIO VERTEBRAL SEL VERTEBRAL UNI R MOD SED  Result Date: 12/15/2019 Study Result CLINICAL DATA:  New onset of right-sided arm and leg numbness associated with episodes of intermittent dysarthria and difficulty finding words, right upper extremity weakness, and dizziness.  EXAM: IR ANGIO EXTERNAL CAROTID SEL EXT CAROTID BILAT MOD SED  COMPARISON:  CT angiogram of the head and neck.  MEDICATIONS: Heparin 0 units IV; no antibiotic was administered within 1 hour of the procedure.  ANESTHESIA/SEDATION: Versed 1 mg IV; Fentanyl 100 mcg IV  Moderate Sedation Time:  36 minutes  The patient was continuously monitored during the procedure by the interventional radiology nurse under my direct supervision.  CONTRAST:  Isovue 300 approximately 60 mL.  FLUOROSCOPY TIME:  Fluoroscopy Time: 9 minutes 36 seconds (1894 mGy).  COMPLICATIONS: None immediate.  TECHNIQUE: Informed written consent was obtained from the patient after a thorough discussion of the procedural risks, benefits and alternatives. All questions were addressed. Maximal Sterile Barrier Technique was utilized including caps, mask, sterile gowns, sterile gloves, sterile drape, hand hygiene and skin antiseptic. A timeout was performed prior to the initiation of the procedure.  The right groin was prepped and draped in the usual sterile fashion. Thereafter using modified Seldinger technique, transfemoral access into the right common femoral artery was obtained without difficulty. Over a 0.035 inch guidewire,  a 5 French Pinnacle sheath was inserted. Through this, and also over 0.035 inch guidewire, a 5 [place] JB 1 catheter was advanced to the aortic arch region and selectively positioned in the right common carotid artery, the right vertebral artery, the left common carotid artery and the left subclavian artery.  FINDINGS: The innominate artery angiogram demonstrates the origins of the right subclavian artery and the right common carotid artery to be widely patent.  The right common carotid arteriogram demonstrates mild atherosclerotic related narrowing at the origin of the right external carotid artery. Its branches opacify normally.  The right internal carotid artery at the bulb demonstrates mild stenosis with a partially calcified plaque without evidence of intraluminal filling defects or of ulcerations.  At the level of C2 there is a fine linear band seen best on the lateral projection felt to be extraluminal.  Mild tortuosity is seen of the mid right ICA segment. More distally, the right internal carotid artery is seen to opacify to the cranial skull base. The petrous, cavernous and supraclinoid segments are widely patent.  The right middle cerebral artery and the right anterior cerebral artery opacify into the capillary and venous phases.  Prompt opacification via the anterior communicating artery of the left anterior cerebral A2 segment, the left anterior cerebral A1 hypoplastic segment, and the left middle cerebral artery is seen with mixing of unopacified blood from the left supraclinoid ICA.  The origin of the right vertebral artery is widely patent.  The vessel is seen to opacify to the cranial skull base. Wide patency is seen of the right vertebrobasilar junction and the right posterior-inferior cerebellar artery.  The basilar artery, the posterior cerebral arteries, the superior cerebellar arteries and the anterior-inferior cerebellar arteries demonstrate wide patency into  the capillary and venous  phases. However, prompt retrograde opacification via the left posterior communicating artery of the left middle cerebral artery territory is seen. Also demonstrated is retrograde opacification of the left vertebrobasilar junction opacifying the left posteroinferior cerebellar artery and retrogradely opacifying the left vertebral artery to the level of the left subclavian artery, consistent with left subclavian steal.  The left common carotid arteriogram demonstrates the left external carotid artery origin and its branches to be widely patent.  The left internal carotid artery at the bulb demonstrates complete angiographic occlusion without evidence of a delayed string sign angiographically.  More distally, there is reconstitution of the caval cavernous segment of the left internal carotid artery from the left external carotid artery branches via the ophthalmic artery.  There is mild narrowing of the supraclinoid left ICA.  Prompt appearance of the left middle cerebral artery proximally and of the superior and inferior divisions is seen with mixing of unopacified blood from the right internal carotid artery injection via the anterior communicating artery. Mild stenosis of the superior division of the left middle cerebral artery is noted.  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  Partial retrograde opacification of the left middle cerebral artery and the left anterior cerebral artery via the anterior communicating artery from the right internal carotid artery injection.  Collateral support to the left anterior and the left middle cerebral artery  distribution from the vertebral artery from the right vertebral artery via the left posterior communicating artery.  Prompt retrograde opacification of the left vertebrobasilar junction to the left subclavian artery consistent with subclavian steal.  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  PLAN: Follow-up in the clinic 4 weeks post discharge.   Electronically Signed   By: Luanne Bras M.D.   On: 12/15/2019 11:41    IR ANGIO EXTERNAL CAROTID SEL EXT CAROTID BILAT MOD SED  Result Date: 12/16/2019 CLINICAL DATA:  New onset of right-sided arm and leg numbness associated with episodes of intermittent dysarthria and difficulty finding words, right upper extremity weakness, and dizziness. EXAM: IR ANGIO EXTERNAL CAROTID SEL EXT CAROTID BILAT MOD SED COMPARISON:  CT angiogram of the head and neck. MEDICATIONS: Heparin 0 units IV; no antibiotic was administered within 1 hour of the procedure. ANESTHESIA/SEDATION: Versed 1 mg IV; Fentanyl 100 mcg IV Moderate Sedation Time:  36 minutes The patient was continuously monitored during the procedure by the interventional radiology nurse under my direct supervision. CONTRAST:  Isovue 300 approximately 60 mL. FLUOROSCOPY TIME:  Fluoroscopy Time: 9 minutes 36 seconds (1894 mGy). COMPLICATIONS: None immediate. TECHNIQUE: Informed written consent was obtained from the patient after a thorough discussion of the procedural risks, benefits and alternatives. All questions were addressed. Maximal Sterile Barrier Technique was utilized including caps, mask, sterile gowns, sterile gloves, sterile drape, hand hygiene and skin antiseptic. A timeout was performed prior to the initiation of the procedure. The right groin was prepped and draped in the usual sterile fashion. Thereafter using modified Seldinger technique, transfemoral access into the right common femoral artery was obtained without difficulty. Over a 0.035 inch guidewire, a 5  French Pinnacle sheath was inserted. Through this, and also over 0.035 inch guidewire, a 5  [place] 1 catheter was advanced to the aortic arch region and selectively positioned in the right common carotid artery, the right vertebral artery, the left common carotid artery and the left subclavian artery. FINDINGS: The innominate artery angiogram demonstrates the origins of the right subclavian artery and the right common carotid artery to be widely patent. The right common carotid arteriogram demonstrates mild atherosclerotic related narrowing at the origin of the right external carotid artery. Its branches opacify normally. The right internal carotid artery at the bulb demonstrates mild stenosis with a partially calcified plaque without evidence of intraluminal filling defects or of ulcerations. At the level of C2 there is a fine linear band seen best on the lateral projection felt to be extraluminal. Mild tortuosity is seen of the mid right ICA segment. More distally, the right internal carotid artery is seen to opacify to the cranial skull base. The petrous, cavernous and supraclinoid segments are widely patent. The right middle cerebral artery and the right anterior cerebral artery opacify into the capillary and venous phases. Prompt opacification via the anterior communicating artery of the left anterior cerebral A2 segment, the left anterior cerebral A1 hypoplastic segment, and the left middle cerebral artery is seen with mixing of unopacified blood from the left supraclinoid ICA. The origin of the right vertebral artery is widely patent. The vessel is seen to opacify to the cranial skull base. Wide patency is seen of the right vertebrobasilar junction and the right posterior-inferior cerebellar artery. The basilar artery, the posterior cerebral arteries, the superior cerebellar arteries and the anterior-inferior cerebellar arteries demonstrate wide patency into the capillary and venous phases. However, prompt  retrograde opacification via the left posterior communicating artery of the left middle cerebral artery territory is seen. Also demonstrated is retrograde opacification of the left vertebrobasilar junction opacifying the left posteroinferior cerebellar artery and retrogradely opacifying the left vertebral artery to the level of the left subclavian artery, consistent with left subclavian steal. The left common carotid arteriogram demonstrates the left external carotid artery origin and its branches to be widely patent. The left internal carotid artery at the bulb demonstrates complete angiographic occlusion without evidence of a delayed string sign angiographically. More distally, there is reconstitution of the caval cavernous segment of the left internal carotid artery from the left external carotid artery branches via the ophthalmic artery. There is mild narrowing of the supraclinoid left ICA. Prompt appearance of the left middle cerebral artery proximally and of the superior and inferior divisions is seen with mixing of unopacified blood from the right internal carotid artery injection via the anterior communicating artery. Mild stenosis of the superior division of the left middle cerebral artery is noted. 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 Partial retrograde opacification of the left middle cerebral artery and the left anterior cerebral artery via the anterior communicating artery from the right internal carotid artery injection. Collateral support to the left anterior and the left middle cerebral artery distribution from the vertebral artery from  the right vertebral artery via the left posterior communicating artery. Prompt retrograde opacification of the left vertebrobasilar junction to the left subclavian artery consistent with subclavian steal. 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 PLAN: Follow-up  in the clinic 4 weeks post discharge. Electronically Signed   By: Luanne Bras M.D.   On: 12/15/2019 11:41   Cerebral Angio  12/15/2019 0310 S/P bilateral common carotid ,RT VA and Lt subclavian arteriograms. RT CFA approach.  1.Occluded LT ICA prox with robust reconstitution in the cavernous seg from LT ECA branches via  Ophthalmic A.. 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 3.LT MCA collateral flow  from RT ICA via ACOM and  RT VA via LT PCOM. 4. No angio evidence of string sign.  12/15/2019 at 0726 S/P RT common carotid and LT subclavian arteriograms. RT CFA approach. S/P unsuccessful at  revascularization of LT ICA .  Occluded Lt subclavian artery prox.  PHYSICAL EXAM   Temp:  [97.6 F (36.4 C)-99.7 F (37.6 C)] 99.7 F (37.6 C) (02/23 0800) Pulse Rate:  [29-99] 73 (02/23 0926) Resp:  [10-27] 27 (02/23 0715) BP: (75-182)/(30-143) 161/92 (02/23 0926) SpO2:  [88 %-100 %] 97 % (02/23 0715)  General - Well nourished, well developed, in no apparent distress.  Ophthalmologic - fundi not visualized due to noncooperation.  Cardiovascular - Regular rhythm and rate.  Mental Status -  Level of arousal and orientation to time, place, and person were intact. Language including expression, naming, repetition, comprehension was assessed and found intact. Fund of Knowledge was assessed and was intact.  Cranial Nerves II - XII - II - Visual field intact OU. III, IV, VI - Extraocular movements intact. V - Facial sensation intact bilaterally. VII - Facial movement intact bilaterally. VIII - Hearing & vestibular intact bilaterally. X - Palate elevates symmetrically.  XI - Chin turning &  shoulder shrug intact bilaterally. XII - Tongue protrusion intact  Motor Strength - The patient's strength was normal in all extremities and pronator drift was absent.  Bulk was normal and fasciculations were absent.   Motor Tone - Muscle tone was assessed at the neck and appendages and was normal.  Reflexes - The patient's reflexes were symmetrical in all extremities and he had no pathological reflexes.  Sensory - Light touch, temperature/pinprick were assessed and were symmetrical.    Coordination - The patient had normal movements in the hands with no ataxia or dysmetria.  Tremor was absent.  Gait and Station - deferred.   ASSESSMENT/PLAN Mr. LEROY LARISCY is a 49 y.o. male with history of CAD s/p stent, HTN, HLD, and tobacco use presenting to [facility] ED with hx stroke sx 2 days ago that completely resolved with imaging showing a completed R parietal infarct. Now with acute R sided weakness R facial droop, aphasia and dysarthria. Found to have a L ICA occlusion w/ large penumbra. Transferred to [facility] for IR.   TIA due to left hemisphere hypoperfusion secondary to chronic L ICA occlusion  CT head subacute superior L parietal infarct. ASPECTS 9    CTA head & neck L ICA occlusion at bifurcation. 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    CT perfusion 6cc L parietal vertex infarct. 208cc L MCA penumbra due to slow collateral flow   Cerebral angio 2/22 at 0310 L ICA occlusion. L SCA occlusion w/ subclavian steal.  Neuro worsening with RUE HP. CT w/ small L frontal lobe and parietal infarct s w/ mild progression.  Cerebral angio 2/22 at unsuccessful revascularization of L ICA occlusion. L SCA occlusion.  MRI  unable to tolerate  2D Echo EF 55-60%  LDL 93  HgbA1c 6.1  Lovenox 40 mg sq  daily for VTE prophylaxis  aspirin 81 mg daily prior to admission, now on aspirin 325 mg daily and clopidogrel 75 mg daily after brilinta 180mg  load. Continue DAPT  w/ plavix x 3 month then plavix alone.   Therapy recommendations:  HH OT  Disposition:  pending   BP dependent left hemisphere hypoperfusion  New or worsening with BP at 120s  Was on Neo-Synephrine   Switched to Levophed for fluid volume  On IV fluid 100 cc/h  Albumin every 6 hours for 4 doses  BP goal 130-160  Orthostatic vitals lying 161/92, sitting 155/73, standing 125/87  Place TED hose - knee high  May consider midodrine if needed   Left ICA chronic occlusion  Seen by cardiology  2/17 carotid Doppler left ICA occlusion  CTA head and neck left ICA likely chronic occlusion due to significance and the collateral flow on the left MCA through [facility] and PCOM  Cerebral angio again demonstrated left ICA occlusion with significant collateral flow  Left subclavian steal  Carotid Doppler 2/17 indicate left VA retrograde flow  CTA head and neck indicate left subclavian artery occlusion with possible steal  Cerebral angio again demonstrated left subclavian artery occlusion with subclavian steal  Recommend outpatient vascular surgery consult to consider bypass surgery for better posterior circulation which also will help collateral flow through PCOM   Hypertension  Home meds:  Coreg 25 bid . Off levophed  . On IVF . Completed albumin course . BP goal 130-160  Hyperlipidemia  Home meds:  lipitor 80, resumed in hospital  LDL 93, goal < 70  Continue statin at discharge  Tobacco abuse  Current smoker  Smoking cessation counseling provided  Pt is willing to quit   Other Stroke Risk Factors  Morbid Obesity, Body mass index is 42.81 kg/m., recommend weight loss, diet and exercise as appropriate   Coronary artery disease s/p stent 2006, 2011  Family hx cardiovascular dz at young age (father died of MI at 35)  OSA  Other Active Problems  BPH on flomax  Hospital day # 1  This patient is critically ill due to symptomatic left hemisphere hypoperfusion,  chronic left ICA occlusion, left subclavian steal, BP dependent and at significant risk of neurological worsening, death form stroke, hemorrhagic conversion, cerebral edema, heart failure. This patient's care requires constant monitoring of vital signs, hemodynamics, respiratory and cardiac monitoring, review of multiple databases, neurological assessment, discussion with family, other specialists and medical decision making of high complexity. I spent 35 minutes of neurocritical care time in the care of this patient.   Rosalin Hawking, MD PhD Stroke Neurology 12/16/2019 11:00 AM   To contact Stroke Continuity provider, please refer to [email or web address] After hours, contact General Neurology

## 2019-12-16 NOTE — Evaluation (Signed)
Speech Language Pathology Evaluation Patient Details Name: Brandon [name] MRN: [id number] DOB: 07-Feb-1971 Today's Date: 12/16/2019 Time: [id number] SLP Time Calculation (min) (ACUTE ONLY): 25 min  Problem List:  Patient Active Problem List   Diagnosis Date Noted  . Stroke ([place]) 12/15/2019  . Stroke (cerebrum) ([place]) 12/15/2019  . Internal carotid artery occlusion, left 12/15/2019  . Penile pain 03/21/2019  . Ureteral stone 03/21/2019  . Dysuria 03/21/2019  . Abdominal pain 01/23/2019  . Genital warts 01/23/2019  . Screen for colon cancer 01/23/2019  . Rectal abnormality 01/23/2019  . History of renal calculi 01/22/2019  . Left lower quadrant abdominal pain 01/22/2019  . Hematuria 01/22/2019  . 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 Sleep apnea 12/29/2012  . Arteriosclerotic cardiovascular disease (ASCVD)   . Tobacco abuse   . History of noncompliance with medical treatment   . Hyperlipidemia 05/30/2011  . Hypertension 05/30/2011  . Obesity 05/30/2011   Past Medical History:  Past Medical History:  Diagnosis Date  . Arteriosclerotic cardiovascular disease (ASCVD) 2006, 2012   2006-acute IMI treated with urgent RCA stent; 2007-[place] for in-stent restenosis; 02/2010-presented with ACS and minimal troponin elevation:70% LAD, 80% distal circumflex, 80% proximal ramus branch vessel,in-stent restenosis of 70% in the RCA; BMS for proximal critical RCA stenosis, restenosis Nov 2012  . Heart attack ([place])   . History of noncompliance with medical treatment    Due to financial considerations  . Hyperlipidemia   . Hypertension   . Tobacco abuse    40 pack years   Past Surgical History:  Past Surgical History:  Procedure Laterality Date  . CORONARY ANGIOPLASTY WITH STENT PLACEMENT    . IR ANGIO EXTERNAL CAROTID SEL EXT CAROTID BILAT MOD SED  12/15/2019  . IR ANGIO VERTEBRAL SEL VERTEBRAL UNI R MOD SED  12/15/2019  . IR ANGIOGRAM EXTREMITY LEFT  12/15/2019  . IR  ANGIOGRAM EXTREMITY LEFT  12/15/2019  . IR CT HEAD LTD  12/15/2019  . IR PERCUTANEOUS ART THROMBECTOMY/INFUSION INTRACRANIAL INC DIAG ANGIO  12/15/2019   HPI:  49 yo male presents with R facial droop, aphasia, dysarthria and R side weakness. 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 PMH: ASCVD, heart attack, HLD, HTN, tobacco abuse.   Assessment / Plan / Recommendation Clinical Impression   Patient's cognitive-linguistic skills were assessed via the COGNISTAT. He presents with problem solving and executive functioning deficits. The pt also was noted with slurred speech, but still remained intelligible. He struggled particularly during subtest assessing calculations, reporting this is not something he would typically have difficulties with. 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 The patient will benefit from further SLP services while in acute care, targeting deficits mentioned above in a functional way. Given that the pt is very independent in the home (maintains bills, meds, etc) and at his job, he may also benefit from [place], pending his progress in tx. The pt and wife both agreed with plan.    SLP Assessment  SLP Recommendation/Assessment: Patient needs continued Speech Lanaguage Pathology Services SLP Visit Diagnosis: Cognitive communication deficit (R41.841)    Follow Up Recommendations  Home health SLP    Frequency and Duration min 2x/week  2 weeks      SLP Evaluation Cognition  Overall Cognitive Status: Impaired/Different from baseline Arousal/Alertness: Awake/alert Orientation Level: Oriented X4 Attention: Sustained Sustained Attention: Appears intact Memory: Appears intact Awareness: Appears intact  Problem Solving: Impaired Problem Solving Impairment: Functional complex Executive Function: Writer: Impaired Organizing Impairment: Functional complex Safety/Judgment:  Appears intact       Comprehension  Auditory Comprehension Overall Auditory Comprehension: Appears within functional limits for tasks assessed Visual Recognition/Discrimination Discrimination: Within Function Limits    Expression Expression Primary Mode of Expression: Verbal Verbal Expression Overall Verbal Expression: Appears within functional limits for tasks assessed Written Expression Dominant Hand: Right Written Expression: Within Functional Limits   Oral / Motor  Oral Motor/Sensory Function Overall Oral Motor/Sensory Function: Within functional limits Motor Speech Overall Motor Speech: Impaired Respiration: Within functional limits Phonation: Normal Resonance: Within functional limits Articulation: Impaired Level of Impairment: Conversation Intelligibility: Intelligibility reduced Word: 75-100% accurate Phrase: 75-100% accurate Sentence: 75-100% accurate Conversation: 75-100% accurate Motor Planning: Witnin functional limits   GO                   Aline August, Student SLP Office: [phone]  12/16/2019, 4:51 PM

## 2019-12-16 NOTE — Progress Notes (Signed)
Pt heart rate showing unusual rates going from 90's-40's. Dropped twice down to the 30's. Pt clinically the same. Notified MD- will discuss with AM team.

## 2019-12-16 NOTE — Evaluation (Signed)
Occupational Therapy Evaluation Patient Details Name: Brandon [name] MRN: [id number] DOB: 10-01-1971 Today's Date: 12/16/2019    History of Present Illness 49 yo male presents with R facial droop, aphasia, dysarthria and R side weakness. 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 PMH: ASCVD, heart attack, HLD, HTN, tobacco abuse   Clinical Impression   PT admitted with L ICA occulusion with failed revascularization. Pt currently with functional limitiations due to the deficits listed below (see OT problem list). Pt min (A) for basic transfer with dizziness and decreased BP. Pt must be MOD I to return home and will need to be able to drive. Pt is main caregiver for himself and wife.  Pt will benefit from skilled OT to increase their independence and safety with adls and balance to allow discharge hHOT.     Follow Up Recommendations  Home health OT    Equipment Recommendations  None recommended by OT    Recommendations for Other Services       Precautions / Restrictions Precautions Precautions: Fall      Mobility Bed Mobility Overal bed mobility: Needs Assistance Bed Mobility: Supine to Sit;Rolling Rolling: Min guard   Supine to sit: Min guard     General bed mobility comments: Pt exiting the bed on right side. Pt requires a rocking momentum to progress to static sitting and pushing / pulling on sheet  Transfers Overall transfer level: Needs assistance   Transfers: Sit to/from Stand Sit to Stand: Min assist         General transfer comment: pt able to power up from bed surface and using IV pole with R UE for steady (A)    Balance Overall balance assessment: Needs assistance Sitting-balance support: Bilateral upper extremity supported;Feet supported Sitting balance-Leahy Scale: Fair     Standing balance support: Single extremity supported;During functional activity Standing balance-Leahy Scale: Fair                             ADL  either performed or assessed with clinical judgement   ADL Overall ADL's : Needs assistance/impaired Eating/Feeding: Modified independent   Grooming: Modified independent   Upper Body Bathing: Min guard   Lower Body Bathing: Minimal assistance   Upper Body Dressing : Min guard   Lower Body Dressing: Minimal assistance Lower Body Dressing Details (indicate cue type and reason): pt is able to dress bil LE at EOB with hip hike onto bed surface and increased effort. pt reports "thats normal because of this belly"  Toilet Transfer: Minimal assistance             General ADL Comments: Pt transfered EOB to chair only at this time watching BP restrictions by neuro     Vision Baseline Vision/History: No visual deficits       Perception     Praxis      Pertinent Vitals/Pain Pain Assessment: No/denies pain     Hand Dominance Right   Extremity/Trunk Assessment Upper Extremity Assessment Upper Extremity Assessment: Overall WFL for tasks assessed   Lower Extremity Assessment Lower Extremity Assessment: Defer to PT evaluation   Cervical / Trunk Assessment Cervical / Trunk Assessment: Normal   Communication Communication Communication: No difficulties   Cognition Arousal/Alertness: Awake/alert Behavior During Therapy: WFL for tasks assessed/performed Overall Cognitive Status: Within Functional Limits for tasks assessed  General Comments  see orthostatic vitals - pt maintained goal BP range so able to sit up in chair with pending wife visit. Chair placed next to patient for wife to sit and visit    Exercises     Shoulder Instructions      Home Living Family/patient expects to be discharged to:: Private residence Living Arrangements: Spouse/significant other Available Help at Discharge: Family Type of Home: House Home Access: Ramped entrance;Stairs to enter Entrance Stairs-Number of Steps: 2   [place]: One  level     Bathroom Shower/Tub: Walk-in shower;Tub/shower unit   [employer]: Standard     Home Equipment: Shower seat   Additional Comments: wife has Parkinson's but able to complete all her adls . Pt reports he must drive her as the only (A) he provides at this time      Prior Functioning/Environment Level of Independence: Independent        Comments: works as Education officer, community for [employer]        OT Problem List: Decreased strength;Impaired balance (sitting and/or standing);Decreased safety awareness;Obesity      OT Treatment/Interventions: Self-care/ADL training;Therapeutic exercise;Neuromuscular education;Energy conservation;DME and/or AE instruction;Manual therapy;Therapeutic activities;Patient/family education;Cognitive remediation/compensation;Balance training    OT Goals(Current goals can be found in the care plan section) Acute Rehab OT Goals Patient Stated Goal: 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) OT Goal Formulation: With patient Time For Goal Achievement: 12/30/19 Potential to Achieve Goals: Good  OT Frequency: Min 2X/week   Barriers to D/C: Decreased caregiver support          Co-evaluation PT/OT/SLP Co-Evaluation/Treatment: Yes Reason for Co-Treatment: To address functional/ADL transfers   OT goals addressed during session: ADL's and self-care;Proper use of Adaptive equipment and DME;Strengthening/ROM      AM-PAC OT "6 Clicks" Daily Activity     Outcome Measure Help from another person eating meals?: None Help from another person taking care of personal grooming?: None Help from another person toileting, which includes using toliet, bedpan, or urinal?: A Little Help from another person bathing (including washing, rinsing, drying)?: A Little Help from another person to put on and taking off regular upper body clothing?: A Little Help from another person to put on and taking off regular lower body clothing?: A Little 6 Click Score:  20   End of Session Equipment Utilized During Treatment: Gait belt Nurse Communication: Mobility status;Precautions  Activity Tolerance: Patient tolerated treatment well Patient left: in chair;with call bell/phone within reach;with chair alarm set  OT Visit Diagnosis: Unsteadiness on feet (R26.81);Muscle weakness (generalized) (M62.81)                Time: [id number] OT Time Calculation (min): 25 min Charges:  OT General Charges $OT Visit: 1 Visit OT Evaluation $OT Eval Moderate Complexity: 1 Mod   Brynn, OTR/L  Acute Rehabilitation Services Pager: [phone] Office: [phone] .   Jeri Modena 12/16/2019, 10:04 AM

## 2019-12-16 NOTE — Progress Notes (Signed)
Unable to tolerate MRI. Notified MD. Will attempt again later today

## 2019-12-17 ENCOUNTER — Telehealth: Payer: Self-pay | Admitting: Cardiovascular Disease

## 2019-12-17 DIAGNOSIS — G4730 Sleep apnea, unspecified: Secondary | ICD-10-CM

## 2019-12-17 DIAGNOSIS — E782 Mixed hyperlipidemia: Secondary | ICD-10-CM

## 2019-12-17 DIAGNOSIS — G459 Transient cerebral ischemic attack, unspecified: Secondary | ICD-10-CM

## 2019-12-17 DIAGNOSIS — G458 Other transient cerebral ischemic attacks and related syndromes: Secondary | ICD-10-CM

## 2019-12-17 DIAGNOSIS — N400 Enlarged prostate without lower urinary tract symptoms: Secondary | ICD-10-CM

## 2019-12-17 DIAGNOSIS — I2510 Atherosclerotic heart disease of native coronary artery without angina pectoris: Secondary | ICD-10-CM

## 2019-12-17 DIAGNOSIS — Z720 Tobacco use: Secondary | ICD-10-CM

## 2019-12-17 LAB — CBC
HCT: 40.3 % (ref 39.0–52.0)
Hemoglobin: 14 g/dL (ref 13.0–17.0)
MCH: 31.6 pg (ref 26.0–34.0)
MCHC: 34.7 g/dL (ref 30.0–36.0)
MCV: 91 fL (ref 80.0–100.0)
Platelets: 138 10*3/uL — ABNORMAL LOW (ref 150–400)
RBC: 4.43 MIL/uL (ref 4.22–5.81)
RDW: 13 % (ref 11.5–15.5)
WBC: 8.4 10*3/uL (ref 4.0–10.5)
nRBC: 0 % (ref 0.0–0.2)

## 2019-12-17 LAB — BASIC METABOLIC PANEL
Anion gap: 11 (ref 5–15)
BUN: 11 mg/dL (ref 6–20)
CO2: 22 mmol/L (ref 22–32)
Calcium: 8.9 mg/dL (ref 8.9–10.3)
Chloride: 107 mmol/L (ref 98–111)
Creatinine, Ser: 0.81 mg/dL (ref 0.61–1.24)
GFR calc Af Amer: >60 mL/min (ref >60)
GFR calc non Af Amer: >60 mL/min (ref >60)
Glucose, Bld: 110 mg/dL — ABNORMAL HIGH (ref 70–99)
Potassium: 3.6 mmol/L (ref 3.5–5.1)
Sodium: 140 mmol/L (ref 135–145)

## 2019-12-17 MED ORDER — MIDODRINE HCL 5 MG PO TABS: 5.0000 mg | ORAL_TABLET | Freq: Three times a day (TID) | ORAL | Status: DC

## 2019-12-17 MED ORDER — CLOPIDOGREL BISULFATE 75 MG PO TABS: 75.0000 mg | ORAL_TABLET | Freq: Every day | ORAL | 2 refills | Status: DC

## 2019-12-17 MED ORDER — INFLUENZA VAC SPLIT QUAD 0.5 ML IM SUSY: 0.5000 mL | PREFILLED_SYRINGE | INTRAMUSCULAR | Status: DC

## 2019-12-17 MED ORDER — ASPIRIN 325 MG PO TBEC: 325.0000 mg | DELAYED_RELEASE_TABLET | Freq: Every day | ORAL | 0 refills | Status: AC

## 2019-12-17 NOTE — TOC Transition Note (Signed)
Transition of Care [facility]) - CM/SW Discharge Note   Patient Details  Name: Brandon [name] MRN: [id number] Date of Birth: 07-Jan-1971  [facility] CM/SW Contact:  Ella Bodo, RN Phone Number: 12/17/2019, 2:12 PM   Clinical Narrative:  49 yo male presents with R facial droop, aphasia, dysarthria and R side weakness. 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  PTA, pt independent, lives at home with spouse.  PT/OT/ST recommending follow up therapy, and pt agreeable to OP follow up.  Referrals made to [facility] for OP PT/OT and ST in Epic, and rehab information placed on AVS.         Final next level of care: OP Rehab Barriers to Discharge: Barriers Resolved                         Discharge Plan and Services   Discharge Planning Services: CM Consult                                 Social Determinants of Health (SDOH) Interventions     Readmission Risk Interventions No flowsheet data found.  Reinaldo Raddle, RN, BSN  Trauma/Neuro ICU Case Manager [phone]

## 2019-12-17 NOTE — Telephone Encounter (Signed)
Returned call-left message.  Will make Dr. Gwenlyn Found aware.    Advised to call back with questions or concerns.

## 2019-12-17 NOTE — Discharge Summary (Addendum)
Stroke Discharge Summary  Patient ID: Brandon [name]   MRN: [id number]      DOB: 05/07/1971  Date of Admission: 12/14/2019 Date of Discharge: 12/17/2019  Attending Physician:  Rosalin Hawking, MD, Stroke MD Consultant(s):     Olene Craven) Estanislado Pandy, MD (Interventional Neuroradiologist)  Patient's PCP:  Denita Lung, MD  DISCHARGE DIAGNOSIS:  Principal Problem:   TIA (transient ischemic attack) d/t L brain hypoperfusion from L ICA occlusion Active Problems:   Hyperlipidemia   Hypertension   Morbid obesity ([place])   Arteriosclerotic cardiovascular disease (ASCVD)   Tobacco abuse   Sleep apnea   Internal carotid artery occlusion, left   Subclavian steal syndrome   BPH (benign prostatic hyperplasia)   Allergies as of 12/17/2019   No Known Allergies     Medication List    STOP taking these medications   carvedilol 25 MG tablet Commonly known as: COREG     TAKE these medications   aspirin 325 MG EC tablet Take 1 tablet (325 mg total) by mouth daily. Start taking on: December 18, 2019 What changed:   medication strength  how much to take   atorvastatin 80 MG tablet Commonly known as: LIPITOR Take 1 tablet (80 mg total) by mouth daily.   clopidogrel 75 MG tablet Commonly known as: PLAVIX Take 1 tablet (75 mg total) by mouth daily. Start taking on: December 18, 2019       LABORATORY STUDIES CBC    Component Value Date/Time   WBC 8.4 12/17/2019 0511   RBC 4.43 12/17/2019 0511   HGB 14.0 12/17/2019 0511   HGB 16.5 10/31/2019 1432   HCT 40.3 12/17/2019 0511   HCT 47.5 10/31/2019 1432   PLT 138 (L) 12/17/2019 0511   PLT 172 10/31/2019 1432   MCV 91.0 12/17/2019 0511   MCV 92 10/31/2019 1432   MCH 31.6 12/17/2019 0511   MCHC 34.7 12/17/2019 0511   RDW 13.0 12/17/2019 0511   RDW 13.5 10/31/2019 1432   LYMPHSABS 4.3 (H) 12/15/2019 0357   LYMPHSABS 3.5 (H) 10/31/2019 1432   MONOABS 0.6 12/15/2019 0357   EOSABS 0.4 12/15/2019 0357   EOSABS 0.4  10/31/2019 1432   BASOSABS 0.2 (H) 12/15/2019 0357   BASOSABS 0.1 10/31/2019 1432   CMP    Component Value Date/Time   NA 140 12/17/2019 0511   NA 143 10/31/2019 1432   K 3.6 12/17/2019 0511   CL 107 12/17/2019 0511   CO2 22 12/17/2019 0511   GLUCOSE 110 (H) 12/17/2019 0511   BUN 11 12/17/2019 0511   BUN 16 10/31/2019 1432   CREATININE 0.81 12/17/2019 0511   CREATININE 0.92 06/19/2017 1004   CALCIUM 8.9 12/17/2019 0511   PROT 7.1 12/14/2019 2321   PROT 7.1 12/04/2019 1312   ALBUMIN 3.8 12/14/2019 2321   ALBUMIN 4.3 12/04/2019 1312   AST 26 12/14/2019 2321   ALT 38 12/14/2019 2321   ALKPHOS 81 12/14/2019 2321   BILITOT 0.9 12/14/2019 2321   BILITOT 0.8 12/04/2019 1312   GFRNONAA >60 12/17/2019 0511   GFRAA >60 12/17/2019 0511   COAGS Lab Results  Component Value Date   INR 1.0 12/14/2019   INR 5.19 (HH) 08/20/2011   INR 1.08 02/24/2010   Lipid Panel    Component Value Date/Time   CHOL 155 12/15/2019 0344   CHOL 215 (H) 12/04/2019 1312   TRIG 152 (H) 12/15/2019 0344   HDL 32 (L) 12/15/2019 0344   HDL 42 12/04/2019 1312  CHOLHDL 4.8 12/15/2019 0344   VLDL 30 12/15/2019 0344   LDLCALC 93 12/15/2019 0344   LDLCALC 146 (H) 12/04/2019 1312   HgbA1C  Lab Results  Component Value Date   HGBA1C 6.1 (H) 12/15/2019   Alcohol Level    Component Value Date/Time   ETH <10 12/14/2019 2321    SIGNIFICANT DIAGNOSTIC STUDIES CT Angio Head W or Wo Contrast  Result Date: 12/15/2019 CLINICAL DATA:  Slurred speech.  Right-sided facial droop EXAM: CT ANGIOGRAPHY HEAD AND NECK CT PERFUSION BRAIN TECHNIQUE: Multidetector CT imaging of the head and neck was performed using the standard protocol during bolus administration of intravenous contrast. Multiplanar CT image reconstructions and MIPs were obtained to evaluate the vascular anatomy. Carotid stenosis measurements (when applicable) are obtained utilizing NASCET criteria, using the distal internal carotid diameter as the  denominator. Multiphase CT imaging of the brain was performed following IV bolus contrast injection. Subsequent parametric perfusion maps were calculated using RAPID software. CONTRAST:  130mL OMNIPAQUE IOHEXOL 350 MG/ML SOLN COMPARISON:  Head CT earlier same day. FINDINGS: CTA NECK FINDINGS Aortic arch: Aortic atherosclerosis. Right carotid system: Common carotid artery widely patent to the bifurcation region. Soft and calcified plaque at the carotid bifurcation and ICA bulb. No measurable stenosis however. Cervical ICA widely patent beyond bulb. Left carotid system: Common carotid artery shows intimal thickening but is widely patent to the bifurcation. There is occlusion of the ICA at the bifurcation. No reconstituted flow in the cervical region. ECA widely patent. Vertebral arteries: Right subclavian artery is widely patent. Right vertebral artery is widely patent through the cervical region. Left subclavian artery is occluded just beyond its origin, with reconstituted flow likely due to retrograde flow in the left vertebral artery. Skeleton: Negative Other neck: No mass or lymphadenopathy. Upper chest: Upper lungs are clear. Review of the MIP images confirms the above findings CTA HEAD FINDINGS Anterior circulation: Right internal carotid artery is patent through the skull base and siphon region. No antegrade flow in the left ICA at the skull base level. There is reconstituted flow in the siphon, probably with contributions from external to internal collaterals, patent anterior communicating artery and patent posterior communicating artery at least on the left. Right anterior and middle cerebral vessels appear widely patent. Left anterior and middle cerebral vessels show flow. No large or medium vessel occlusion is identified. Posterior circulation: Both vertebral arteries show flow at the foramen magnum with presumed retrograde flow on the left. Basilar artery appears normal. Superior cerebellar and posterior  cerebral arteries appear normal. Venous sinuses: Patent and normal. Anatomic variants: None other significant. Review of the MIP images confirms the above findings CT Brain Perfusion Findings: ASPECTS: 9 CBF (<30%) Volume: 36mL Perfusion (Tmax>6.0s) volume: 232mL Mismatch Volume: 273mL Infarction Location:Left parietal vertex as shown by CT IMPRESSION: Occlusion of the left internal carotid artery at the bifurcation. Reconstituted flow in the left carotid siphon likely due to external to internal collaterals, patent anterior communicating artery and patent posterior communicating artery on the left. No large or medium vessel intracranial occlusion is identified. 6 cc region of completed infarction at the left parietal vertex. 208 cc region of T-max greater than 6 seconds throughout the left MCA territory due to the reconstituted flow described above. 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 These results were called by telephone at the time of interpretation on 12/15/2019 at 12:04 am to provider [facility]  DELO , who verbally acknowledged these results. Electronically Signed   By: Nelson Chimes M.D.   On: 12/15/2019 00:08   CT HEAD WO CONTRAST  Result Date: 12/15/2019 CLINICAL DATA:  Subacute neuro deficit EXAM: CT HEAD WITHOUT CONTRAST TECHNIQUE: Contiguous axial images were obtained from the base of the skull through the vertex without intravenous contrast. COMPARISON:  Yesterday FINDINGS: Brain: 2 small areas of cytotoxic edema along the left parietal and superior frontal convexities, mildly progressed from before. No hemorrhage, hydrocephalus, or midline shift. Vascular: There is still circulating intravascular contrast. No vessel asymmetry. Skull: Negative Sinuses/Orbits: Negative IMPRESSION: Small acute left frontal and parietal infarcts with mild progression from  yesterday. No hemorrhagic conversion. Electronically Signed   By: Monte Fantasia M.D.   On: 12/15/2019 04:52   CT Angio Neck W and/or Wo Contrast  Result Date: 12/15/2019 CLINICAL DATA:  Slurred speech.  Right-sided facial droop EXAM: CT ANGIOGRAPHY HEAD AND NECK CT PERFUSION BRAIN TECHNIQUE: Multidetector CT imaging of the head and neck was performed using the standard protocol during bolus administration of intravenous contrast. Multiplanar CT image reconstructions and MIPs were obtained to evaluate the vascular anatomy. Carotid stenosis measurements (when applicable) are obtained utilizing NASCET criteria, using the distal internal carotid diameter as the denominator. Multiphase CT imaging of the brain was performed following IV bolus contrast injection. Subsequent parametric perfusion maps were calculated using RAPID software. CONTRAST:  135mL OMNIPAQUE IOHEXOL 350 MG/ML SOLN COMPARISON:  Head CT earlier same day. FINDINGS: CTA NECK FINDINGS Aortic arch: Aortic atherosclerosis. Right carotid system: Common carotid artery widely patent to the bifurcation region. Soft and calcified plaque at the carotid bifurcation and ICA bulb. No measurable stenosis however. Cervical ICA widely patent beyond bulb. Left carotid system: Common carotid artery shows intimal thickening but is widely patent to the bifurcation. There is occlusion of the ICA at the bifurcation. No reconstituted flow in the cervical region. ECA widely patent. Vertebral arteries: Right subclavian artery is widely patent. Right vertebral artery is widely patent through the cervical region. Left subclavian artery is occluded just beyond its origin, with reconstituted flow likely due to retrograde flow in the left vertebral artery. Skeleton: Negative Other neck: No mass or lymphadenopathy. Upper chest: Upper lungs are clear. Review of the MIP images confirms the above findings CTA HEAD FINDINGS Anterior circulation: Right internal carotid artery is  patent through the skull base and siphon region. No antegrade flow in the left ICA at the skull base level. There is reconstituted flow in the siphon, probably with contributions from external to internal collaterals, patent anterior communicating artery and patent posterior communicating artery at least on the left. Right anterior and middle cerebral vessels appear widely patent. Left anterior and middle cerebral vessels show flow. No large or medium vessel occlusion is identified. Posterior circulation: Both vertebral arteries show flow at the foramen magnum with presumed retrograde flow on the left. Basilar artery appears normal. Superior cerebellar and posterior cerebral arteries appear normal. Venous sinuses: Patent and normal. Anatomic variants: None other significant. Review of the MIP images confirms the above findings CT Brain Perfusion Findings: ASPECTS: 9 CBF (<30%) Volume: 48mL Perfusion (Tmax>6.0s) volume: 249mL Mismatch Volume: 235mL Infarction Location:Left parietal vertex as shown by CT IMPRESSION: Occlusion of the left internal carotid artery at the bifurcation. Reconstituted flow in the left carotid siphon likely due to external to internal collaterals, patent anterior communicating artery and patent posterior communicating artery on the left. No large or medium vessel intracranial occlusion is identified. 6  cc region of completed infarction at the left parietal vertex. 208 cc region of T-max greater than 6 seconds throughout the left MCA territory due to the reconstituted flow described above. 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 These results were called by telephone at the time of interpretation on 12/15/2019 at 12:04 am to provider Veryl Speak , who verbally acknowledged these results. Electronically Signed   By: Nelson Chimes M.D.   On: 12/15/2019  00:08   IR Angiogram Extremity Left  Result Date: 12/15/2019 INDICATION: Worsening neurological symptoms with dysarthria, right-sided numbness and weakness with difficulty with expression.  EXAM: 1. EMERGENT LARGE VESSEL OCCLUSION THROMBOLYSIS anterior CIRCULATION)  COMPARISON:  Diagnostic catheter arteriogram of December 14, 2019.  MEDICATIONS: Ancef 2 g IV antibiotic was administered within 1 hour of the procedure.  ANESTHESIA/SEDATION: General anesthesia.  CONTRAST:  Isovue 300 approximately 60 mL.  FLUOROSCOPY TIME:  Fluoroscopy Time: 52 minutes 30 seconds (2331 mGy).  COMPLICATIONS: None immediate.  TECHNIQUE: Following a full explanation of the procedure along with the potential associated complications, an informed witnessed consent was obtained patient and spouse. The risks of intracranial hemorrhage of 10%, worsening neurological deficit, ventilator dependency, death and inability to revascularize were all reviewed in detail with the patient's spouse.  The patient was then put under general anesthesia by the Department of Anesthesiology at [facility].  The right groin was prepped and draped in the usual sterile fashion. Thereafter using modified Seldinger technique, transfemoral access into the right common femoral artery was obtained without difficulty. Over a 0.035 inch guidewire a 8 French 23 cm Brite tip sheath was inserted. Through this, and also over a 0.035 inch guidewire a 5 [place] JB 1 catheter was advanced to the aortic arch region and selectively positioned in the left common carotid artery and the left subclavian artery.  FINDINGS: The left common carotid arteriogram continues to demonstrate angiographically occluded left internal carotid artery at the bulb without evidence of a delayed string sign.  Reconstitution of the distal left internal carotid artery and the cavernous and supraclinoid segment is again demonstrated from the left external carotid artery collaterals  via the ophthalmic artery.  PROCEDURE: The diagnostic JB 1 catheter in the left internal carotid artery was exchanged over a 0.035 inch 300 cm Rosen exchange guidewire for a 95 cm 0.087 balloon guide catheter which had been prepped with 50% contrast and 50% heparinized saline infusion.  The balloon guide was then advanced just proximal to the left common carotid artery bifurcation. The guidewire was removed. Good aspiration was obtained from the hub of the balloon guide catheter. Using biplane roadmap technique, initially an 014 inch standard Synchro micro guidewire was advanced inside of an 021 Trevo ProVue microcatheter.  Multiple attempts were made using a torque device to advance and access the occluded left internal carotid artery proximally without success.  The wire was then replaced with an 016 Fathom micro guidewire.  Again with the microcatheter tip right against the wall of the occluded left internal carotid artery at the bulb, and with the balloon guide advanced to abut the occluded left internal carotid artery at the bulb, multiple attempts were made to access the occluded left internal carotid artery without success.  This system was then replaced with a 5 French 125 cm slip cath. This was then advanced over a 0.0035 Roadrunner guidewire and directed against the wall of the  left internal carotid artery at the bulb.  Multiple attempts were then made to access the occluded left internal carotid artery initially with an 035 inch Roadrunner wire, and a glide guidewire without success.  Finally attempts were made to advance the curved end of the slip cath into the occluded left internal carotid artery at the bulb again without success. The balloon guide was then removed.  Over a 0.035 inch Roadrunner guidewire, a JB 1 5 French diagnostic catheter was then advanced and positioned in the occluded left subclavian artery.  Wire manipulation and catheter manipulation combination was then performed  using a 0.035 inch Roadrunner guidewire, a glide wire, and finally an 016 Fathom micro guidewire without success.  The diagnostic catheter was then retrieved and removed.  The 8 French Brite tip 23 cm sheath was then removed with the successful application of a 7 [place] ExoSeal closure device.  The right groin appeared soft. Distal pulses remained Dopplerable in the dorsalis pedis, and the posterior tibial regions bilaterally unchanged.  A flat panel CT of the brain revealed no evidence of mass effect, midline shift or of intracranial hemorrhage.  The patient's general anesthesia was then reversed and the patient was then extubated without difficulty. Upon recovery, the patient was able to moved all fours equally, and also responded appropriately.  The patient was then returned to neuro ICU in stable condition for further management.  IMPRESSION: Status post unsuccessful attempt at recanalization of the left internal carotid artery proximally, and also of the left subclavian artery at its proximal occlusion.  PLAN: Follow-up in the clinic 4 weeks post discharge.   Electronically Signed   By: Luanne Bras M.D.   On: 12/15/2019 12:10   IR Angiogram Extremity Left  Result Date: 12/15/2019 Study Result CLINICAL DATA:  New onset of right-sided arm and leg numbness associated with episodes of intermittent dysarthria and difficulty finding words, right upper extremity weakness, and dizziness.  EXAM: IR ANGIO EXTERNAL CAROTID SEL EXT CAROTID BILAT MOD SED  COMPARISON:  CT angiogram of the head and neck.  MEDICATIONS: Heparin 0 units IV; no antibiotic was administered within 1 hour of the procedure.  ANESTHESIA/SEDATION: Versed 1 mg IV; Fentanyl 100 mcg IV  Moderate Sedation Time:  36 minutes  The patient was continuously monitored during the procedure by the interventional radiology nurse under my direct supervision.  CONTRAST:  Isovue 300 approximately 60 mL.  FLUOROSCOPY TIME:  Fluoroscopy  Time: 9 minutes 36 seconds (1894 mGy).  COMPLICATIONS: None immediate.  TECHNIQUE: Informed written consent was obtained from the patient after a thorough discussion of the procedural risks, benefits and alternatives. All questions were addressed. Maximal Sterile Barrier Technique was utilized including caps, mask, sterile gowns, sterile gloves, sterile drape, hand hygiene and skin antiseptic. A timeout was performed prior to the initiation of the procedure.  The right groin was prepped and draped in the usual sterile fashion. Thereafter using modified Seldinger technique, transfemoral access into the right common femoral artery was obtained without difficulty. Over a 0.035 inch guidewire, a 5 French Pinnacle sheath was inserted. Through this, and also over 0.035 inch guidewire, a 5 [place] JB 1 catheter was advanced to the aortic arch region and selectively positioned in the right common carotid artery, the right vertebral artery, the left common carotid artery and the left subclavian artery.  FINDINGS: The innominate artery angiogram demonstrates the origins of the right subclavian artery and the right common carotid artery to be widely patent.  The right common carotid  arteriogram demonstrates mild atherosclerotic related narrowing at the origin of the right external carotid artery. Its branches opacify normally.  The right internal carotid artery at the bulb demonstrates mild stenosis with a partially calcified plaque without evidence of intraluminal filling defects or of ulcerations.  At the level of C2 there is a fine linear band seen best on the lateral projection felt to be extraluminal.  Mild tortuosity is seen of the mid right ICA segment. More distally, the right internal carotid artery is seen to opacify to the cranial skull base. The petrous, cavernous and supraclinoid segments are widely patent.  The right middle cerebral artery and the right anterior cerebral artery opacify into the capillary  and venous phases.  Prompt opacification via the anterior communicating artery of the left anterior cerebral A2 segment, the left anterior cerebral A1 hypoplastic segment, and the left middle cerebral artery is seen with mixing of unopacified blood from the left supraclinoid ICA.  The origin of the right vertebral artery is widely patent.  The vessel is seen to opacify to the cranial skull base. Wide patency is seen of the right vertebrobasilar junction and the right posterior-inferior cerebellar artery.  The basilar artery, the posterior cerebral arteries, the superior cerebellar arteries and the anterior-inferior cerebellar arteries demonstrate wide patency into the capillary and venous phases. However, prompt retrograde opacification via the left posterior communicating artery of the left middle cerebral artery territory is seen. Also demonstrated is retrograde opacification of the left vertebrobasilar junction opacifying the left posteroinferior cerebellar artery and retrogradely opacifying the left vertebral artery to the level of the left subclavian artery, consistent with left subclavian steal.  The left common carotid arteriogram demonstrates the left external carotid artery origin and its branches to be widely patent.  The left internal carotid artery at the bulb demonstrates complete angiographic occlusion without evidence of a delayed string sign angiographically.  More distally, there is reconstitution of the caval cavernous segment of the left internal carotid artery from the left external carotid artery branches via the ophthalmic artery.  There is mild narrowing of the supraclinoid left ICA.  Prompt appearance of the left middle cerebral artery proximally and of the superior and inferior divisions is seen with mixing of unopacified blood from the right internal carotid artery injection via the anterior communicating artery. Mild stenosis of the superior division of the left middle cerebral  artery is noted.  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  Partial retrograde opacification of the left middle cerebral artery and the left anterior cerebral artery via the anterior communicating artery from the right internal carotid artery injection.  Collateral support to the left anterior and the left middle cerebral artery distribution from the vertebral artery from the right vertebral artery via the left posterior communicating artery.  Prompt retrograde opacification of the left vertebrobasilar junction to the left subclavian artery consistent with subclavian steal.  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  PLAN: Follow-up in the clinic 4 weeks post discharge.   Electronically Signed   By: Luanne Bras M.D.   On: 12/15/2019 11:41    IR CT Head Ltd  Result Date: 12/15/2019 INDICATION: Worsening neurological symptoms with dysarthria, right-sided  numbness and weakness with difficulty with expression.  EXAM: 1. EMERGENT LARGE VESSEL OCCLUSION THROMBOLYSIS anterior CIRCULATION)  COMPARISON:  Diagnostic catheter arteriogram of December 14, 2019.  MEDICATIONS: Ancef 2 g IV antibiotic was administered within 1 hour of the procedure.  ANESTHESIA/SEDATION: General anesthesia.  CONTRAST:  Isovue 300 approximately 60 mL.  FLUOROSCOPY TIME:  Fluoroscopy Time: 52 minutes 30 seconds (2331 mGy).  COMPLICATIONS: None immediate.  TECHNIQUE: Following a full explanation of the procedure along with the potential associated complications, an informed witnessed consent was obtained patient and spouse. The  risks of intracranial hemorrhage of 10%, worsening neurological deficit, ventilator dependency, death and inability to revascularize were all reviewed in detail with the patient's spouse.  The patient was then put under general anesthesia by the Department of Anesthesiology at [facility].  The right groin was prepped and draped in the usual sterile fashion. Thereafter using modified Seldinger technique, transfemoral access into the right common femoral artery was obtained without difficulty. Over a 0.035 inch guidewire a 8 French 23 cm Brite tip sheath was inserted. Through this, and also over a 0.035 inch guidewire a 5 [place] JB 1 catheter was advanced to the aortic arch region and selectively positioned in the left common carotid artery and the left subclavian artery.  FINDINGS: The left common carotid arteriogram continues to demonstrate angiographically occluded left internal carotid artery at the bulb without evidence of a delayed string sign.  Reconstitution of the distal left internal carotid artery and the cavernous and supraclinoid segment is again demonstrated from the left external carotid artery collaterals via the ophthalmic artery.  PROCEDURE: The diagnostic JB 1 catheter in the left internal carotid artery was exchanged over a 0.035 inch 300 cm Rosen exchange guidewire for a 95 cm 0.087 balloon guide catheter which had been prepped with 50% contrast and 50% heparinized saline infusion.  The balloon guide was then advanced just proximal to the left common carotid artery bifurcation. The guidewire was removed. Good aspiration was obtained from the hub of the balloon guide catheter. Using biplane roadmap technique, initially an 014 inch standard Synchro micro guidewire was advanced inside of an 021 Trevo ProVue microcatheter.  Multiple attempts were made using a torque device to advance and access the occluded left internal carotid artery proximally without success.  The wire was then  replaced with an 016 Fathom micro guidewire.  Again with the microcatheter tip right against the wall of the occluded left internal carotid artery at the bulb, and with the balloon guide advanced to abut the occluded left internal carotid artery at the bulb, multiple attempts were made to access the occluded left internal carotid artery without success.  This system was then replaced with a 5 French 125 cm slip cath. This was then advanced over a 0.0035 Roadrunner guidewire and directed against the wall of the left internal carotid artery at the bulb.  Multiple attempts were then made to access the occluded left internal carotid artery initially with an 035 inch Roadrunner wire, and a glide guidewire without success.  Finally attempts were made to advance the curved end of the slip cath into the occluded left internal carotid artery at the bulb again without success. The balloon guide was then removed.  Over a 0.035 inch Roadrunner guidewire, a JB 1 5 French diagnostic catheter was then advanced and positioned in the occluded left subclavian artery.  Wire manipulation and catheter manipulation combination was then performed using a 0.035 inch Roadrunner guidewire, a glide wire, and  finally an 016 Fathom micro guidewire without success.  The diagnostic catheter was then retrieved and removed.  The 8 French Brite tip 23 cm sheath was then removed with the successful application of a 7 [place] ExoSeal closure device.  The right groin appeared soft. Distal pulses remained Dopplerable in the dorsalis pedis, and the posterior tibial regions bilaterally unchanged.  A flat panel CT of the brain revealed no evidence of mass effect, midline shift or of intracranial hemorrhage.  The patient's general anesthesia was then reversed and the patient was then extubated without difficulty. Upon recovery, the patient was able to moved all fours equally, and also responded appropriately.  The patient was then returned to  neuro ICU in stable condition for further management.  IMPRESSION: Status post unsuccessful attempt at recanalization of the left internal carotid artery proximally, and also of the left subclavian artery at its proximal occlusion.  PLAN: Follow-up in the clinic 4 weeks post discharge.   Electronically Signed   By: Luanne Bras M.D.   On: 12/15/2019 12:10   CT CEREBRAL PERFUSION W CONTRAST  Result Date: 12/15/2019 CLINICAL DATA:  Slurred speech.  Right-sided facial droop EXAM: CT ANGIOGRAPHY HEAD AND NECK CT PERFUSION BRAIN TECHNIQUE: Multidetector CT imaging of the head and neck was performed using the standard protocol during bolus administration of intravenous contrast. Multiplanar CT image reconstructions and MIPs were obtained to evaluate the vascular anatomy. Carotid stenosis measurements (when applicable) are obtained utilizing NASCET criteria, using the distal internal carotid diameter as the denominator. Multiphase CT imaging of the brain was performed following IV bolus contrast injection. Subsequent parametric perfusion maps were calculated using RAPID software. CONTRAST:  168mL OMNIPAQUE IOHEXOL 350 MG/ML SOLN COMPARISON:  Head CT earlier same day. FINDINGS: CTA NECK FINDINGS Aortic arch: Aortic atherosclerosis. Right carotid system: Common carotid artery widely patent to the bifurcation region. Soft and calcified plaque at the carotid bifurcation and ICA bulb. No measurable stenosis however. Cervical ICA widely patent beyond bulb. Left carotid system: Common carotid artery shows intimal thickening but is widely patent to the bifurcation. There is occlusion of the ICA at the bifurcation. No reconstituted flow in the cervical region. ECA widely patent. Vertebral arteries: Right subclavian artery is widely patent. Right vertebral artery is widely patent through the cervical region. Left subclavian artery is occluded just beyond its origin, with reconstituted flow likely due to retrograde  flow in the left vertebral artery. Skeleton: Negative Other neck: No mass or lymphadenopathy. Upper chest: Upper lungs are clear. Review of the MIP images confirms the above findings CTA HEAD FINDINGS Anterior circulation: Right internal carotid artery is patent through the skull base and siphon region. No antegrade flow in the left ICA at the skull base level. There is reconstituted flow in the siphon, probably with contributions from external to internal collaterals, patent anterior communicating artery and patent posterior communicating artery at least on the left. Right anterior and middle cerebral vessels appear widely patent. Left anterior and middle cerebral vessels show flow. No large or medium vessel occlusion is identified. Posterior circulation: Both vertebral arteries show flow at the foramen magnum with presumed retrograde flow on the left. Basilar artery appears normal. Superior cerebellar and posterior cerebral arteries appear normal. Venous sinuses: Patent and normal. Anatomic variants: None other significant. 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 Reconstituted flow in the left carotid siphon likely due to external to internal collaterals, patent anterior communicating artery and patent posterior communicating artery on the left. No large or medium vessel intracranial occlusion is identified. 6 cc region of completed infarction at the left parietal vertex. 208 cc region of T-max greater than 6 seconds throughout the left MCA territory due to the reconstituted flow described above. 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 These results were called by telephone at the time of interpretation on 12/15/2019 at 12:04 am to provider Veryl Speak , who verbally acknowledged these results. Electronically Signed   By: Nelson Chimes M.D.   On: 12/15/2019 00:08   DG CHEST PORT 1 VIEW  Result Date: 12/15/2019 CLINICAL DATA:  Stroke, hypertension, MI EXAM: PORTABLE CHEST 1 VIEW COMPARISON:  Radiograph 09/12/2018 FINDINGS: Lung volumes are low with hazy interstitial opacities and cephalized, indistinct pulmonary vascularity. Cardiac silhouette is markedly enlarged from comparison exams. No visible pneumothorax or effusion. No focal consolidative opacity. No acute osseous or soft tissue abnormality. Telemetry leads overlie the chest. IMPRESSION: 1. Markedly enlarged cardiac silhouette from comparison exams. Could reflect cardiomegaly or pericardial effusion. 2. Additional features of likely vascular congestion and interstitial edema on a background of low volumes and atelectasis. Electronically Signed   By: Lovena Le M.D.   On: 12/15/2019 06:26   ECHOCARDIOGRAM COMPLETE  Result Date: 12/15/2019    ECHOCARDIOGRAM REPORT   Patient Name:   JONANTHAN ANDREOLA Date of Exam: 12/15/2019 Medical Rec #:  [id number]       Height:       69.0 in Accession #:    [id number]      Weight:       289.9 lb Date of Birth:  August 27, 1971       BSA:          2.419 m Patient Age:    76 years        BP:           128/84 mmHg Patient Gender: M               HR:           62 bpm. Exam Location:  Inpatient Procedure: 2D Echo, Cardiac Doppler and Color Doppler Indications:    Stroke 434.91  History:        Patient has prior history of Echocardiogram examinations, most                 recent 01/22/2018. Stroke; Risk Factors:Current Smoker.  Sonographer:    Vickie Epley RDCS Referring Phys: [place]  1. Left ventricular ejection fraction, by estimation, is 55 to 60%. The left ventricle has normal function. Left ventricular  endocardial border not optimally defined to evaluate regional wall motion. The left ventricular internal cavity size was mildly dilated. Left ventricular diastolic parameters are indeterminate. Elevated left ventricular end-diastolic pressure.  2. Right ventricular systolic function is normal. The right ventricular size is normal. Tricuspid regurgitation signal is inadequate for assessing PA pressure.  3. The mitral valve is normal in structure and function. No evidence of mitral valve regurgitation. No evidence of mitral stenosis.  4. The aortic valve is tricuspid. Aortic valve regurgitation is trivial. No aortic stenosis is present.  5. The inferior vena cava is normal in size with greater than 50%  respiratory variability, suggesting right atrial pressure of 3 mmHg. FINDINGS  Left Ventricle: Left ventricular ejection fraction, by estimation, is 55 to 60%. The left ventricle has normal function. Left ventricular endocardial border not optimally defined to evaluate regional wall motion. The left ventricular internal cavity size was mildly dilated. There is no left ventricular hypertrophy. Left ventricular diastolic parameters are indeterminate. Elevated left ventricular end-diastolic pressure. Right Ventricle: The right ventricular size is normal. No increase in right ventricular wall thickness. Right ventricular systolic function is normal. Tricuspid regurgitation signal is inadequate for assessing PA pressure. Left Atrium: Left atrial size was normal in size. Right Atrium: Right atrial size was normal in size. Pericardium: There is no evidence of pericardial effusion. Mitral Valve: The mitral valve is normal in structure and function. Normal mobility of the mitral valve leaflets. No evidence of mitral valve regurgitation. No evidence of mitral valve stenosis. Tricuspid Valve: The tricuspid valve is normal in structure. Tricuspid valve regurgitation is not demonstrated. No evidence of tricuspid stenosis. Aortic  Valve: The aortic valve is tricuspid. Aortic valve regurgitation is trivial. No aortic stenosis is present. Pulmonic Valve: The pulmonic valve was normal in structure. Pulmonic valve regurgitation is not visualized. No evidence of pulmonic stenosis. Aorta: The aortic root is normal in size and structure. Venous: The inferior vena cava is normal in size with greater than 50% respiratory variability, suggesting right atrial pressure of 3 mmHg. IAS/Shunts: No atrial level shunt detected by color flow Doppler.  LEFT VENTRICLE PLAX 2D LVIDd:         6.10 cm      Diastology LVIDs:         4.80 cm      LV e' lateral:   6.73 cm/s LV PW:         0.80 cm      LV E/e' lateral: 14.5 LV IVS:        0.80 cm      LV e' medial:    5.51 cm/s LVOT diam:     2.10 cm      LV E/e' medial:  17.7 LV SV:         61.31 ml LV SV Index:   25.34 LVOT Area:     3.46 cm  LV Volumes (MOD) LV vol d, MOD A2C: 151.0 ml LV vol d, MOD A4C: 173.0 ml LV vol s, MOD A2C: 88.8 ml LV vol s, MOD A4C: 89.5 ml LV SV MOD A2C:     62.2 ml LV SV MOD A4C:     173.0 ml LV SV MOD BP:      70.8 ml RIGHT VENTRICLE RV S prime:     10.90 cm/s TAPSE (M-mode): 1.8 cm LEFT ATRIUM             Index LA diam:        4.10 cm 1.69 cm/m LA Vol (A2C):   40.8 ml 16.87 ml/m LA Vol (A4C):   43.3 ml 17.90 ml/m LA Biplane Vol: 41.9 ml 17.32 ml/m  AORTIC VALVE LVOT Vmax:   85.00 cm/s LVOT Vmean:  55.100 cm/s LVOT VTI:    0.177 m  AORTA Ao Root diam: 3.20 cm MITRAL VALVE MV Area (PHT): 3.65 cm    SHUNTS MV Decel Time: 208 msec    Systemic VTI:  0.18 m MV E velocity: 97.50 cm/s  Systemic Diam: 2.10 cm MV A velocity: 67.90 cm/s MV E/A ratio:  1.44 Fransico Him MD Electronically signed by Fransico Him MD Signature  Date/Time: 12/15/2019/10:37:20 AM    Final    IR PERCUTANEOUS ART THROMBECTOMY/INFUSION INTRACRANIAL INC DIAG ANGIO  Result Date: 12/16/2019 INDICATION: Worsening neurological symptoms with dysarthria, right-sided numbness and weakness with difficulty with expression.  EXAM: 1. EMERGENT LARGE VESSEL OCCLUSION THROMBOLYSIS anterior CIRCULATION) COMPARISON:  Diagnostic catheter arteriogram of December 14, 2019. MEDICATIONS: Ancef 2 g IV antibiotic was administered within 1 hour of the procedure. ANESTHESIA/SEDATION: General anesthesia. CONTRAST:  Isovue 300 approximately 60 mL. FLUOROSCOPY TIME:  Fluoroscopy Time: 52 minutes 30 seconds (2331 mGy). COMPLICATIONS: None immediate. TECHNIQUE: Following a full explanation of the procedure along with the potential associated complications, an informed witnessed consent was obtained patient and spouse. The risks of intracranial hemorrhage of 10%, worsening neurological deficit, ventilator dependency, death and inability to revascularize were all reviewed in detail with the patient's spouse. The patient was then put under general anesthesia by the Department of Anesthesiology at [facility]. The right groin was prepped and draped in the usual sterile fashion. Thereafter using modified Seldinger technique, transfemoral access into the right common femoral artery was obtained without difficulty. Over a 0.035 inch guidewire a 8 French 23 cm Brite tip sheath was inserted. Through this, and also over a 0.035 inch guidewire a 5 [place] JB 1 catheter was advanced to the aortic arch region and selectively positioned in the left common carotid artery and the left subclavian artery. FINDINGS: The left common carotid arteriogram continues to demonstrate angiographically occluded left internal carotid artery at the bulb without evidence of a delayed string sign. Reconstitution of the distal left internal carotid artery and the cavernous and supraclinoid segment is again demonstrated from the left external carotid artery collaterals via the ophthalmic artery. PROCEDURE: The diagnostic JB 1 catheter in the left internal carotid artery was exchanged over a 0.035 inch 300 cm Rosen exchange guidewire for a 95 cm 0.087 balloon guide catheter which had  been prepped with 50% contrast and 50% heparinized saline infusion. The balloon guide was then advanced just proximal to the left common carotid artery bifurcation. The guidewire was removed. Good aspiration was obtained from the hub of the balloon guide catheter. Using biplane roadmap technique, initially an 014 inch standard Synchro micro guidewire was advanced inside of an 021 Trevo ProVue microcatheter. Multiple attempts were made using a torque device to advance and access the occluded left internal carotid artery proximally without success. The wire was then replaced with an 016 Fathom micro guidewire. Again with the microcatheter tip right against the wall of the occluded left internal carotid artery at the bulb, and with the balloon guide advanced to abut the occluded left internal carotid artery at the bulb, multiple attempts were made to access the occluded left internal carotid artery without success. This system was then replaced with a 5 French 125 cm slip cath. This was then advanced over a 0.0035 Roadrunner guidewire and directed against the wall of the left internal carotid artery at the bulb. Multiple attempts were then made to access the occluded left internal carotid artery initially with an 035 inch Roadrunner wire, and a glide guidewire without success. Finally attempts were made to advance the curved end of the slip cath into the occluded left internal carotid artery at the bulb again without success. The balloon guide was then removed. Over a 0.035 inch Roadrunner guidewire, a JB 1 5 French diagnostic catheter was then advanced and positioned in the occluded left subclavian artery. Wire manipulation and catheter manipulation combination was then performed using a 0.035  inch Roadrunner guidewire, a glide wire, and finally an 016 Fathom micro guidewire without success. The diagnostic catheter was then retrieved and removed. The 8 French Brite tip 23 cm sheath was then removed with the successful  application of a 7 [place] ExoSeal closure device. The right groin appeared soft. Distal pulses remained Dopplerable in the dorsalis pedis, and the posterior tibial regions bilaterally unchanged. A flat panel CT of the brain revealed no evidence of mass effect, midline shift or of intracranial hemorrhage. The patient's general anesthesia was then reversed and the patient was then extubated without difficulty. Upon recovery, the patient was able to moved all fours equally, and also responded appropriately. The patient was then returned to neuro ICU in stable condition for further management. IMPRESSION: Status post unsuccessful attempt at recanalization of the left internal carotid artery proximally, and also of the left subclavian artery at its proximal occlusion. PLAN: Follow-up in the clinic 4 weeks post discharge. Electronically Signed   By: Luanne Bras M.D.   On: 12/15/2019 12:10   CT HEAD CODE STROKE WO CONTRAST  Result Date: 12/14/2019 CLINICAL DATA:  Code stroke. Slurred speech and right-sided facial droop EXAM: CT HEAD WITHOUT CONTRAST TECHNIQUE: Contiguous axial images were obtained from the base of the skull through the vertex without intravenous contrast. COMPARISON:  None. FINDINGS: Brain: No acute hemorrhage. There is an area hypoattenuation extending to the cortex of the superior left parietal lobe. There is no midline shift or other mass effect. No hydrocephalus. Vascular: Mild bilateral carotid atherosclerosis. No hyperdense vessel. Skull: Normal. Negative for fracture or focal lesion. Sinuses/Orbits: No acute finding. Other: None. ASPECTS [facility] Stroke Program Early CT Score) - Ganglionic level infarction (caudate, lentiform nuclei, internal capsule, insula, M1-M3 cortex): 7 - Supraganglionic infarction (M4-M6 cortex): 2 Total score (0-10 with 10 being normal): 9 IMPRESSION: 1. No intracranial hemorrhage. 2. 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 3. ASPECTS is  9. These results were called by telephone at the time of interpretation on 12/14/2019 at 11:21 pm to provider Veryl Speak , who verbally acknowledged these results. Electronically Signed   By: Ulyses Jarred M.D.   On: 12/14/2019 23:22   VAS US CAROTID  Result Date: 12/12/2019 Carotid Arterial Duplex Study Indications:   Bilateral bruits and Carotid artery disease. Risk Factors:  Hypertension, hyperlipidemia, current smoker, prior MI, coronary                artery disease. Other Factors: Patient denies any cerebrovascular symptoms. Limitations    Today's exam was limited due to the high bifurcation of the                carotid, the body habitus of the patient and the patient's                respiratory variation. Performing Technologist: Wilkie Aye RVT  Examination Guidelines: A complete evaluation includes B-mode imaging, spectral Doppler, color Doppler, and power Doppler as needed of all accessible portions of each vessel. Bilateral testing is considered an integral part of a complete examination. Limited examinations for reoccurring indications may be performed as noted.  Right Carotid Findings: +----------+-------+-------+--------+------------------------+-----------------+           PSV    EDV    StenosisPlaque Description      Comments                    cm/s   cm/s                                                     +----------+-------+-------+--------+------------------------+-----------------+  CCA Prox  63     14                                                       +----------+-------+-------+--------+------------------------+-----------------+ CCA Distal41     12                                     intimal                                                                   thickening        +----------+-------+-------+--------+------------------------+-----------------+ ICA Prox  47     17             heterogenous and                                                           irregular                                 +----------+-------+-------+--------+------------------------+-----------------+ ICA Mid   67     30                                                       +----------+-------+-------+--------+------------------------+-----------------+ ICA Distal98     36                                                       +----------+-------+-------+--------+------------------------+-----------------+ ECA       165    36             heterogenous                              +----------+-------+-------+--------+------------------------+-----------------+ +----------+--------+-------+----------------+-------------------+           PSV cm/sEDV cmsDescribe        Arm Pressure (mmHG) +----------+--------+-------+----------------+-------------------+ [id number]            Multiphasic, [id number]                 +----------+--------+-------+----------------+-------------------+ +---------+--------+--+--------+--+---------+ VertebralPSV cm/s82EDV cm/s21Antegrade +---------+--------+--+--------+--+---------+  Left Carotid Findings: +----------+--------+--------+--------+------------------+--------+           PSV cm/sEDV cm/sStenosisPlaque DescriptionComments +----------+--------+--------+--------+------------------+--------+ CCA Prox  86      16                                         +----------+--------+--------+--------+------------------+--------+  CCA Distal44      13                                         +----------+--------+--------+--------+------------------+--------+ ICA Prox                  Occludedheterogenous               +----------+--------+--------+--------+------------------+--------+ ICA Mid                   Occludedheterogenous               +----------+--------+--------+--------+------------------+--------+ ICA Distal                Occludedheterogenous                +----------+--------+--------+--------+------------------+--------+ ECA       150     23                                         +----------+--------+--------+--------+------------------+--------+ +----------+--------+--------+----------+-------------------+           PSV cm/sEDV cm/sDescribe  Arm Pressure (mmHG) +----------+--------+--------+----------+-------------------+ [id number]              monophasic                    +----------+--------+--------+----------+-------------------+ +---------+--------+--------+----------+ VertebralPSV cm/sEDV cm/sRetrograde +---------+--------+--------+----------+ Monophasic flow in the left brachial artery and subclavian artery. Unable to obtain blood pressure.  Summary: Right Carotid: Velocities in the right ICA are consistent with a 1-39% stenosis.                Non-hemodynamically significant plaque <50% noted in the CCA. Left Carotid: The internal carotid artery appears to be occluded. Vertebrals:  Right vertebral artery demonstrates antegrade flow. Left vertebral              artery demonstrates retrograde flow. Subclavians: Normal flow hemodynamics were seen in the right subclavian artery.              Monophasic flow in the left subclavian artery. *See table(s) above for measurements and observations.  Vascular consult recommended. Electronically signed by Carlyle Dolly MD on 12/12/2019 at 11:10:00 AM.    Final    IR ANGIO VERTEBRAL SEL VERTEBRAL UNI R MOD SED  Result Date: 12/15/2019 Study Result CLINICAL DATA:  New onset of right-sided arm and leg numbness associated with episodes of intermittent dysarthria and difficulty finding words, right upper extremity weakness, and dizziness.  EXAM: IR ANGIO EXTERNAL CAROTID SEL EXT CAROTID BILAT MOD SED  COMPARISON:  CT angiogram of the head and neck.  MEDICATIONS: Heparin 0 units IV; no antibiotic was administered within 1 hour of the procedure.  ANESTHESIA/SEDATION: Versed 1 mg IV;  Fentanyl 100 mcg IV  Moderate Sedation Time:  36 minutes  The patient was continuously monitored during the procedure by the interventional radiology nurse under my direct supervision.  CONTRAST:  Isovue 300 approximately 60 mL.  FLUOROSCOPY TIME:  Fluoroscopy Time: 9 minutes 36 seconds (1894 mGy).  COMPLICATIONS: None immediate.  TECHNIQUE: Informed written consent was obtained from the patient after a thorough discussion of the procedural risks, benefits and alternatives. All questions were addressed. Maximal Sterile Barrier Technique was utilized including caps, mask, sterile gowns, sterile gloves, sterile drape, hand hygiene and  skin antiseptic. A timeout was performed prior to the initiation of the procedure.  The right groin was prepped and draped in the usual sterile fashion. Thereafter using modified Seldinger technique, transfemoral access into the right common femoral artery was obtained without difficulty. Over a 0.035 inch guidewire, a 5 French Pinnacle sheath was inserted. Through this, and also over 0.035 inch guidewire, a 5 [place] JB 1 catheter was advanced to the aortic arch region and selectively positioned in the right common carotid artery, the right vertebral artery, the left common carotid artery and the left subclavian artery.  FINDINGS: The innominate artery angiogram demonstrates the origins of the right subclavian artery and the right common carotid artery to be widely patent.  The right common carotid arteriogram demonstrates mild atherosclerotic related narrowing at the origin of the right external carotid artery. Its branches opacify normally.  The right internal carotid artery at the bulb demonstrates mild stenosis with a partially calcified plaque without evidence of intraluminal filling defects or of ulcerations.  At the level of C2 there is a fine linear band seen best on the lateral projection felt to be extraluminal.  Mild tortuosity is seen of the mid right ICA segment.  More distally, the right internal carotid artery is seen to opacify to the cranial skull base. The petrous, cavernous and supraclinoid segments are widely patent.  The right middle cerebral artery and the right anterior cerebral artery opacify into the capillary and venous phases.  Prompt opacification via the anterior communicating artery of the left anterior cerebral A2 segment, the left anterior cerebral A1 hypoplastic segment, and the left middle cerebral artery is seen with mixing of unopacified blood from the left supraclinoid ICA.  The origin of the right vertebral artery is widely patent.  The vessel is seen to opacify to the cranial skull base. Wide patency is seen of the right vertebrobasilar junction and the right posterior-inferior cerebellar artery.  The basilar artery, the posterior cerebral arteries, the superior cerebellar arteries and the anterior-inferior cerebellar arteries demonstrate wide patency into the capillary and venous phases. However, prompt retrograde opacification via the left posterior communicating artery of the left middle cerebral artery territory is seen. Also demonstrated is retrograde opacification of the left vertebrobasilar junction opacifying the left posteroinferior cerebellar artery and retrogradely opacifying the left vertebral artery to the level of the left subclavian artery, consistent with left subclavian steal.  The left common carotid arteriogram demonstrates the left external carotid artery origin and its branches to be widely patent.  The left internal carotid artery at the bulb demonstrates complete angiographic occlusion without evidence of a delayed string sign angiographically.  More distally, there is reconstitution of the caval cavernous segment of the left internal carotid artery from the left external carotid artery branches via the ophthalmic artery.  There is mild narrowing of the supraclinoid left ICA.  Prompt appearance of the left middle  cerebral artery proximally and of the superior and inferior divisions is seen with mixing of unopacified blood from the right internal carotid artery injection via the anterior communicating artery. Mild stenosis of the superior division of the left middle cerebral artery is noted.  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  Partial  retrograde opacification of the left middle cerebral artery and the left anterior cerebral artery via the anterior communicating artery from the right internal carotid artery injection.  Collateral support to the left anterior and the left middle cerebral artery distribution from the vertebral artery from the right vertebral artery via the left posterior communicating artery.  Prompt retrograde opacification of the left vertebrobasilar junction to the left subclavian artery consistent with subclavian steal.  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  PLAN: Follow-up in the clinic 4 weeks post discharge.   Electronically Signed   By: Luanne Bras M.D.   On: 12/15/2019 11:41    IR ANGIO EXTERNAL CAROTID SEL EXT CAROTID BILAT MOD SED  Result Date: 12/16/2019 CLINICAL DATA:  New onset of right-sided arm and leg numbness associated with episodes of intermittent dysarthria and difficulty finding words, right upper extremity weakness, and dizziness. EXAM: IR ANGIO EXTERNAL CAROTID SEL EXT CAROTID BILAT MOD SED COMPARISON:  CT angiogram of the head and neck. MEDICATIONS: Heparin 0 units IV; no antibiotic was administered within 1 hour of the procedure. ANESTHESIA/SEDATION: Versed 1 mg  IV; Fentanyl 100 mcg IV Moderate Sedation Time:  36 minutes The patient was continuously monitored during the procedure by the interventional radiology nurse under my direct supervision. CONTRAST:  Isovue 300 approximately 60 mL. FLUOROSCOPY TIME:  Fluoroscopy Time: 9 minutes 36 seconds (1894 mGy). COMPLICATIONS: None immediate. TECHNIQUE: Informed written consent was obtained from the patient after a thorough discussion of the procedural risks, benefits and alternatives. All questions were addressed. Maximal Sterile Barrier Technique was utilized including caps, mask, sterile gowns, sterile gloves, sterile drape, hand hygiene and skin antiseptic. A timeout was performed prior to the initiation of the procedure. The right groin was prepped and draped in the usual sterile fashion. Thereafter using modified Seldinger technique, transfemoral access into the right common femoral artery was obtained without difficulty. Over a 0.035 inch guidewire, a 5 French Pinnacle sheath was inserted. Through this, and also over 0.035 inch guidewire, a 5 [place] JB 1 catheter was advanced to the aortic arch region and selectively positioned in the right common carotid artery, the right vertebral artery, the left common carotid artery and the left subclavian artery. FINDINGS: The innominate artery angiogram demonstrates the origins of the right subclavian artery and the right common carotid artery to be widely patent. The right common carotid arteriogram demonstrates mild atherosclerotic related narrowing at the origin of the right external carotid artery. Its branches opacify normally. The right internal carotid artery at the bulb demonstrates mild stenosis with a partially calcified plaque without evidence of intraluminal filling defects or of ulcerations. At the level of C2 there is a fine linear band seen best on the lateral projection felt to be extraluminal. Mild tortuosity is seen of the mid right ICA segment. More distally, the  right internal carotid artery is seen to opacify to the cranial skull base. The petrous, cavernous and supraclinoid segments are widely patent. The right middle cerebral artery and the right anterior cerebral artery opacify into the capillary and venous phases. Prompt opacification via the anterior communicating artery of the left anterior cerebral A2 segment, the left anterior cerebral A1 hypoplastic segment, and the left middle cerebral artery is seen with mixing of unopacified blood from the left supraclinoid ICA. The origin of the right vertebral artery is widely patent. The vessel is seen to opacify to the cranial skull base. Wide patency is seen of the right vertebrobasilar junction and the  right posterior-inferior cerebellar artery. The basilar artery, the posterior cerebral arteries, the superior cerebellar arteries and the anterior-inferior cerebellar arteries demonstrate wide patency into the capillary and venous phases. However, prompt retrograde opacification via the left posterior communicating artery of the left middle cerebral artery territory is seen. Also demonstrated is retrograde opacification of the left vertebrobasilar junction opacifying the left posteroinferior cerebellar artery and retrogradely opacifying the left vertebral artery to the level of the left subclavian artery, consistent with left subclavian steal. The left common carotid arteriogram demonstrates the left external carotid artery origin and its branches to be widely patent. The left internal carotid artery at the bulb demonstrates complete angiographic occlusion without evidence of a delayed string sign angiographically. More distally, there is reconstitution of the caval cavernous segment of the left internal carotid artery from the left external carotid artery branches via the ophthalmic artery. There is mild narrowing of the supraclinoid left ICA. Prompt appearance of the left middle cerebral artery proximally and of the  superior and inferior divisions is seen with mixing of unopacified blood from the right internal carotid artery injection via the anterior communicating artery. Mild stenosis of the superior division of the left middle cerebral artery is noted. 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 Partial retrograde opacification of the left middle cerebral artery and the left anterior cerebral artery via the anterior communicating artery from the right internal carotid artery injection. Collateral support to the left anterior and the left middle cerebral artery distribution from the vertebral artery from the right vertebral artery via the left posterior communicating artery. Prompt retrograde opacification of the left vertebrobasilar junction to the left subclavian artery consistent with subclavian steal. 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 PLAN: Follow-up in the clinic 4 weeks post discharge. Electronically Signed   By: Luanne Bras M.D.   On: 12/15/2019 11:41      HISTORY OF PRESENT ILLNESS Brandon [name] is an 49 y.o. male who initially presented to the [facility] ED via EMS after acute onset of dizziness, dysphasia ("could not get the words out"), dysarthria and RUE weakness with numbness at home (LKW 12/14/19 at 2130). He endorses having had a stuttering course of symptoms starting 2 days ago. At [facility] a Teleneurology consult was ordered. 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 CTP showed  a small focus of core infarction with a large penumbra. He was not deemed to be a tPA candidate by the teleneurologist due to the subacute infarction presenting an unacceptable risk of hemorrhage. He was sent to [facility] for possible thrombectomy.    HOSPITAL COURSE Mr. AVERIE BERND is a 49 y.o. male with history of CAD s/p stent, HTN, HLD, and tobacco use presenting to [facility] ED with hx stroke sx 2 days ago that completely resolved with imaging showing a completed R parietal infarct. Now with acute R sided weakness R facial droop, aphasia and dysarthria. Found to have a L ICA occlusion w/ large penumbra. Not a tPA candidate. Transferred to [facility] for IR.   TIA due to  left hemisphere hypoperfusion secondary to chronic L ICA occlusion  CT head subacute superior L parietal infarct. ASPECTS 9    CTA head & neck L ICA occlusion at bifurcation. 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    CT perfusion 6cc L parietal vertex infarct. 208cc L MCA penumbra due to slow collateral flow   Cerebral angio 2/22 at 0310 L ICA occlusion. L SCA occlusion w/ subclavian steal.  Neuro worsening with RUE HP. CT w/ small L frontal lobe and parietal infarct s w/ mild progression.  Cerebral angio 2/22 at unsuccessful revascularization of L ICA occlusion. L SCA occlusion.  MRI  unable to tolerate  2D Echo EF 55-60%  LDL 93  HgbA1c 6.1  aspirin 81 mg daily prior to admission, now on aspirin 325 mg daily and clopidogrel 75 mg daily after brilinta 180mg  load. Continue DAPT w/ plavix x 3 month then plavix alone.   Therapy recommendations:  HH OT, HH PT, HH SLP - TOC RN looking into transportation options to OP therapy as wife cannot drive.   Disposition:  return home w/ wife   BP dependent left hemisphere hypoperfusion  New or worsening with BP at 120s  Was on Neo-Synephrine   Switched to Levophed for fluid volume  On IV fluid 100 cc/h  Albumin  every 6 hours for 4 doses  BP goal 130-160  Orthostatic vitals lying 161/92, sitting 155/73, standing 125/87  Place TED hose - knee high  Hold home BP meds at d/c  Left ICA chronic occlusion  Seen by cardiology  2/17 carotid Doppler left ICA occlusion  CTA head and neck left ICA likely chronic occlusion due to significance and the collateral flow on the left MCA through [facility] and PCOM  Cerebral angio again demonstrated left ICA occlusion with significant collateral flow  Left subclavian steal  Carotid Doppler 2/17 indicate left VA retrograde flow  CTA head and neck indicate left subclavian artery occlusion with possible steal  Cerebral angio again demonstrated left subclavian artery occlusion with subclavian steal  Recommend outpatient vascular surgery consult to consider bypass surgery for better posterior circulation which also will help collateral flow through PCOM. Have requested referral through Epic as unable to obtain via telephone.  Hypertension  Home meds:  Coreg 25 bid  Off levophed   On IVF  Completed albumin course  BP goal 130-160  Hold home BP meds at d/c  Hyperlipidemia  Home meds:  lipitor 80, resumed in hospital  LDL 93, goal < 70  Continue statin at discharge  Tobacco abuse  Current smoker  Smoking cessation counseling provided  Pt is willing to quit   Other Stroke Risk Factors  Morbid Obesity, Body mass index is 42.81 kg/m., recommend weight loss, diet and exercise as appropriate   Coronary artery disease s/p stent 2006, 2011  Family hx cardiovascular dz at young age (father died of MI at 29)  OSA  Other Active Problems  BPH on flomax  DISCHARGE EXAM Blood pressure (!) 146/74, pulse 73, temperature 98.3 F (36.8 C), temperature source Oral, resp. rate 13, weight 131.5 kg, SpO2 93 %. General - Well nourished, well developed, in no apparent distress.  Ophthalmologic - fundi not visualized due to  noncooperation.  Cardiovascular - Regular rhythm and rate.  Mental Status -  Level of arousal and orientation to time, place, and person were intact. Language including expression, naming, repetition, comprehension was assessed and found intact. Fund of Knowledge was assessed and was intact.  Cranial Nerves II - XII - II - Visual field intact OU. III, IV, VI - Extraocular movements intact. V - Facial sensation intact bilaterally. VII - Facial movement intact bilaterally. VIII - Hearing & vestibular intact bilaterally. X - Palate elevates symmetrically.  XI - Chin turning & shoulder shrug intact bilaterally. XII - Tongue protrusion intact  Motor Strength - The patient's strength was normal in all extremities and pronator drift was absent.  Bulk was normal and fasciculations were absent.   Motor Tone - Muscle tone was assessed at the neck and appendages and was normal.  Reflexes - The patient's reflexes were symmetrical in all extremities and he had no pathological reflexes.  Sensory - Light touch, temperature/pinprick were assessed and were symmetrical.    Coordination - The patient had normal movements in the hands with no ataxia or dysmetria.  Tremor was absent.  Gait and Station - deferred.  Discharge Diet   Heart healthy thin liquids  DISCHARGE PLAN  Disposition:  Return home  Home health vs OP therapies (PT, OT, SLP) at d/c  aspirin 325 mg daily and clopidogrel 75 mg daily for secondary stroke prevention for 3 months then plavix alone.  Ongoing stroke risk factor control by Primary Care Physician at time of discharge  Follow-up PCP Denita Lung, MD in 2 weeks.  Follow-up in [facility] in 4 weeks, office to schedule an appointment.   outpatient vascular surgery urgent OP consult requested to consider bypass surgery for better posterior circulation which also will help collateral flow through PCOM. Have requested through  Epic as unable to obtain via telephone.  Follow up Dr. Gwenlyn Found for ASCVD   45 minutes were spent preparing discharge.  Rosalin Hawking, MD PhD Stroke Neurology 12/17/2019 6:58 PM

## 2019-12-17 NOTE — Telephone Encounter (Signed)
New Message    Pts wife is calling and wanted to let Dr Gwenlyn Found know he had a stroke on Sunday and is in the Hospital    Please advise

## 2019-12-18 ENCOUNTER — Telehealth: Payer: Self-pay | Admitting: Cardiovascular Disease

## 2019-12-18 ENCOUNTER — Ambulatory Visit (INDEPENDENT_AMBULATORY_CARE_PROVIDER_SITE_OTHER): Payer: 59 | Admitting: Vascular Surgery

## 2019-12-18 ENCOUNTER — Encounter: Payer: Self-pay | Admitting: Vascular Surgery

## 2019-12-18 ENCOUNTER — Other Ambulatory Visit: Payer: Self-pay

## 2019-12-18 ENCOUNTER — Telehealth: Payer: Self-pay

## 2019-12-18 VITALS — BP 130/81 | HR 78 | Temp 98.0°F | Resp 20 | Ht 69.0 in | Wt 291.0 lb

## 2019-12-18 DIAGNOSIS — I708 Atherosclerosis of other arteries: Secondary | ICD-10-CM | POA: Diagnosis not present

## 2019-12-18 NOTE — Telephone Encounter (Signed)
   Primary Cardiologist: [name], MD  Chart reviewed as part of pre-operative protocol coverage. Patient was contacted 12/18/2019 in reference to pre-operative risk assessment for pending surgery as outlined below.  Brandon [name] was last seen on 11/28/19 by Dr. Gwenlyn Found. He was doing well on cardiac stand point and 65yr follow up recommended.   Therefore, based on ACC/AHA guidelines, the patient would be at acceptable risk for the planned procedure without further cardiovascular testing.   I will route this recommendation to the requesting party via Epic fax function and remove from pre-op pool.  Please call with questions.  [place], [place] 12/18/2019, 2:21 PM

## 2019-12-18 NOTE — Progress Notes (Signed)
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 male, who was discharged from [facility] yesterday following a left brain stroke.  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  Duplex ultrasound showed reversal flow in the left vertebral artery.  He has a very large widely patent right subclavian and right vertebral artery.  He does not have any exertional fatigue in his left arm.  He has not complained of any dizziness.  He still has some residual right upper extremity right lower extremity weakness but this is subtle.  He also still has some slowing of his speech and some cognitive deficits especially with math.  He has had some blurriness of his vision but this is chronic and he does not really describe any amaurosis episodes.  He is currently on Plavix aspirin and a statin.  He does have a history of coronary artery disease and previously had coronary stenting by Dr. Gwenlyn Found in 2012.  He has not had any chest pain recently.  He did quit smoking 4 days ago.  Other medical problems include   Past Medical History:  Diagnosis Date  . Arteriosclerotic cardiovascular disease (ASCVD) 2006, 2012   2006-acute IMI treated with urgent RCA stent; 2007-Cutting Balloon for in-stent restenosis; 02/2010-presented with ACS and minimal troponin elevation:70% LAD, 80% distal circumflex, 80% proximal ramus branch vessel,in-stent restenosis of 70% in the RCA; BMS for proximal critical RCA stenosis, restenosis Nov 2012  . Carotid artery occlusion   . Heart attack ([place])   . History of noncompliance with medical treatment    Due to financial considerations  . Hyperlipidemia   .  Hypertension   . Stroke ([place])   . Tobacco abuse    40 pack years   Past Surgical History:  Procedure Laterality Date  . CORONARY ANGIOPLASTY WITH STENT PLACEMENT    . IR ANGIO EXTERNAL CAROTID SEL EXT CAROTID BILAT MOD SED  12/15/2019  . IR ANGIO VERTEBRAL SEL VERTEBRAL UNI R MOD SED  12/15/2019  . IR ANGIOGRAM EXTREMITY LEFT  12/15/2019  . IR ANGIOGRAM EXTREMITY LEFT  12/15/2019  . IR CT HEAD LTD  12/15/2019  . IR PERCUTANEOUS ART THROMBECTOMY/INFUSION INTRACRANIAL INC DIAG ANGIO  12/15/2019  . RADIOLOGY WITH ANESTHESIA N/A 12/15/2019   Procedure: IR WITH ANESTHESIA;  Surgeon: Luanne Bras, MD;  Location: [place];  Service: Radiology;  Laterality: N/A;    Family History  Problem Relation Age of Onset  . Depression Mother 56       Suicide  . Heart disease Father 65       Deceased from massive heart-attack  . Hyperlipidemia Father   . Hypertension Father   . COPD Maternal Grandfather   . Cancer Paternal Grandmother        Male Cancer  . Heart disease Paternal Grandmother     SOCIAL HISTORY: Social History   Socioeconomic History  . Marital status: Married    Spouse name: Not on file  . Number of children: 2  . Years of education: Not on file  . Highest education level: Not on file  Occupational History  . Occupation: Disabled    Comment: Previously employed as  an Programmer, systems  Tobacco Use  . Smoking status: Former Smoker    Packs/day: 1.00    Years: 27.00    Pack years: 27.00    Types: Cigarettes    Quit date: 12/14/2019    Years since quitting: 0.0  . Smokeless tobacco: Never Used  Substance and Sexual Activity  . Alcohol use: No    Comment:  Alcoholism- quit 2002  . Drug use: No  . Sexual activity: Not on file  Other Topics Concern  . Not on file  Social History Narrative            Social Determinants of Health   Financial Resource Strain:   . Difficulty of Paying Living Expenses: Not on file  Food Insecurity:   . Worried About Paediatric nurse in the Last Year: Not on file  . Ran Out of Food in the Last Year: Not on file  Transportation Needs:   . Lack of Transportation (Medical): Not on file  . Lack of Transportation (Non-Medical): Not on file  Physical Activity:   . Days of Exercise per Week: Not on file  . Minutes of Exercise per Session: Not on file  Stress:   . Feeling of Stress : Not on file  Social Connections:   . Frequency of Communication with Friends and Family: Not on file  . Frequency of Social Gatherings with Friends and Family: Not on file  . Attends Religious Services: Not on file  . Active Member of Clubs or Organizations: Not on file  . Attends Archivist Meetings: Not on file  . Marital Status: Not on file  Intimate Partner Violence:   . Fear of Current or Ex-Partner: Not on file  . Emotionally Abused: Not on file  . Physically Abused: Not on file  . Sexually Abused: Not on file    No Known Allergies  Current Outpatient Medications  Medication Sig Dispense Refill  . aspirin EC 325 MG EC tablet Take 1 tablet (325 mg total) by mouth daily. 90 tablet 0  . atorvastatin (LIPITOR) 80 MG tablet Take 1 tablet (80 mg total) by mouth daily. 90 tablet 3  . clopidogrel (PLAVIX) 75 MG tablet Take 1 tablet (75 mg total) by mouth daily. 30 tablet 2   No current facility-administered medications for this visit.    ROS:   General:  No weight loss, Fever, chills  HEENT: No recent headaches, no nasal bleeding, no visual changes, no sore throat  Neurologic: No dizziness, blackouts, seizures. No recent symptoms of stroke or mini- stroke. No recent episodes of slurred speech, or temporary blindness.  Cardiac: No recent episodes of chest pain/pressure, no shortness of breath at rest.  No shortness of breath with exertion.  Denies history of atrial fibrillation or irregular heartbeat  Vascular: No history of rest pain in feet.  No history of claudication.  No history of non-healing ulcer, No  history of DVT   Pulmonary: No home oxygen, no productive cough, no hemoptysis,  No asthma or wheezing  Musculoskeletal:  [ ]  Arthritis, [ ]  Low back pain,  [ ]  Joint pain  Hematologic:No history of hypercoagulable state.  No history of easy bleeding.  No history of anemia  Gastrointestinal: No hematochezia or melena,  No gastroesophageal reflux, no trouble swallowing  Urinary: [ ]  chronic Kidney disease, [ ]  on HD - [ ]  MWF or [ ]  TTHS, [ ]  Burning with urination, [ ]  Frequent urination, [ ]  Difficulty urinating;  Skin: No rashes  Psychological: No history of anxiety,  No history of depression   Physical Examination  Vitals:   12/18/19 1257 12/18/19 1301  BP: 120/79 130/81  Pulse: 78   Resp: 20   Temp: 98 F (36.7 C)   SpO2: 96%   Weight: 291 lb (132 kg)   Height: 5\' 9"  (1.753 m)     Body mass index is 42.97 kg/m.  General:  Alert and oriented, no acute distress HEENT: Normal Neck: No JVD Cardiac: Regular Rate and Rhythm Abdomen: Soft, non-tender, non-distended, no mass, obese Skin: No rash Extremity Pulses:  2+ radial, brachial right arm absent left brachial radial pulse Musculoskeletal: No deformity or edema  Neurologic: Upper and lower extremity motor 5/5 and symmetric no facial asymmetry  DATA:  I reviewed greater than 100 images from Dr. Abram Sander carotid angiogram which shows left subclavian occlusion.  Unfortunately there were not views of the subclavian artery reconstituting.  However, the right vertebral artery is very large and does fill the posterior circulation.  The right internal carotid artery has no real significant stenosis.  The left internal carotid artery is occluded.  I also reviewed the patient's CT angiogram of the head and neck.  This again shows the left subclavian artery is occluded.  There is reconstitution of about 4 cm distal with filling of the left vertebral artery.  I also reviewed the patient's duplex ultrasound from a few  days ago which showed reversal of flow in the left vertebral artery occlusion of the left internal carotid artery and no significant right internal carotid artery stenosis.  ASSESSMENT: Patient with recent stroke left brain possibly due to the left internal carotid artery occlusion.  He was just discharged from the hospital yesterday.  Although his left subclavian artery is occluded he has no symptoms from this.  Specifically he has no "subclavian steal".  Although he may have reversal of flow this clinically is not a subclavian steal as he has no symptoms.  However, due to the fact that his left subclavian and his left carotid are now occluded he probably would benefit from increased perfusion to the left brain by revascularizing his left subclavian artery.  However, I would allow him full recovery from his stroke prior to proceeding with this.   PLAN: Patient will continue his Plavix aspirin and statin.  He will be scheduled for an office visit with me in 1 month to see if he is fully recovered from his stroke.  If he has had full recovery at that point we will consider a left carotid subclavian bypass.  You also need preoperative cardiac risk stratification by Dr. Alvester Chou.  We will organize this appointment.  Ruta Hinds, MD [facility] Office: [phone]

## 2019-12-18 NOTE — Telephone Encounter (Signed)
° °  [facility] Pre-operative Risk Assessment    Request for surgical clearance:  1. What type of surgery is being performed? Left carotid subclavian bypass  2. When is this surgery scheduled? TBD  3. What type of clearance is required (medical clearance vs. Pharmacy clearance to hold med vs. Both)? Medical   4. Are there any medications that need to be held prior to surgery and how long?  5. Practice name and name of physician performing surgery? [facility] / Dr. Oneida Alar  6. What is your office phone number [phone] [name] or [place]    7.   What is your office fax number [phone]  8.   Anesthesia type (None, local, MAC, general) ? unsure   Ermelinda Das 12/18/2019, 1:39 PM  _________________________________________________________________   (provider comments below)

## 2019-12-18 NOTE — Telephone Encounter (Signed)
I called ht ept. To get him scheduled for his hospital f/u from recent hospital visit for a stroke. The patients medication were gone over and reconciled. The pt. Was scheduled for his f/u on 12/29/19.

## 2019-12-23 ENCOUNTER — Telehealth (INDEPENDENT_AMBULATORY_CARE_PROVIDER_SITE_OTHER): Payer: 59 | Admitting: Internal Medicine

## 2019-12-23 ENCOUNTER — Other Ambulatory Visit: Payer: Self-pay

## 2019-12-23 ENCOUNTER — Encounter (HOSPITAL_COMMUNITY): Payer: Self-pay

## 2019-12-23 ENCOUNTER — Encounter (HOSPITAL_COMMUNITY): Payer: Self-pay | Admitting: Speech Pathology

## 2019-12-23 ENCOUNTER — Ambulatory Visit (HOSPITAL_COMMUNITY): Payer: 59 | Admitting: Speech Pathology

## 2019-12-23 ENCOUNTER — Ambulatory Visit (HOSPITAL_COMMUNITY): Payer: 59

## 2019-12-23 ENCOUNTER — Encounter: Payer: Self-pay | Admitting: Internal Medicine

## 2019-12-23 ENCOUNTER — Ambulatory Visit (HOSPITAL_COMMUNITY): Payer: 59 | Attending: Neurology

## 2019-12-23 VITALS — BP 150/87 | HR 92 | Ht 69.0 in | Wt 292.0 lb

## 2019-12-23 DIAGNOSIS — Z8673 Personal history of transient ischemic attack (TIA), and cerebral infarction without residual deficits: Secondary | ICD-10-CM | POA: Diagnosis not present

## 2019-12-23 DIAGNOSIS — R29898 Other symptoms and signs involving the musculoskeletal system: Secondary | ICD-10-CM | POA: Insufficient documentation

## 2019-12-23 DIAGNOSIS — I639 Cerebral infarction, unspecified: Secondary | ICD-10-CM | POA: Insufficient documentation

## 2019-12-23 DIAGNOSIS — I252 Old myocardial infarction: Secondary | ICD-10-CM | POA: Diagnosis not present

## 2019-12-23 DIAGNOSIS — Z8249 Family history of ischemic heart disease and other diseases of the circulatory system: Secondary | ICD-10-CM

## 2019-12-23 DIAGNOSIS — G458 Other transient cerebral ischemic attacks and related syndromes: Secondary | ICD-10-CM

## 2019-12-23 DIAGNOSIS — R41841 Cognitive communication deficit: Secondary | ICD-10-CM | POA: Diagnosis present

## 2019-12-23 DIAGNOSIS — E782 Mixed hyperlipidemia: Secondary | ICD-10-CM | POA: Diagnosis not present

## 2019-12-23 DIAGNOSIS — I2510 Atherosclerotic heart disease of native coronary artery without angina pectoris: Secondary | ICD-10-CM | POA: Diagnosis not present

## 2019-12-23 DIAGNOSIS — I10 Essential (primary) hypertension: Secondary | ICD-10-CM

## 2019-12-23 DIAGNOSIS — G459 Transient cerebral ischemic attack, unspecified: Secondary | ICD-10-CM

## 2019-12-23 NOTE — Patient Instructions (Signed)
Medication Instructions:   REPATHA--Jenna, RN will contact you about starting this medication.  *If you need a refill on your cardiac medications before your next appointment, please call your pharmacy*   Lab Work: Your physician recommends that you return for a FASTING lipid profile: this week; no appointment is needed for blood work in our office.  Your physician recommends that you return for a FASTING lipid profile in 3 months.   If you have labs (blood work) drawn today and your tests are completely normal, you will receive your results only by: Marland Kitchen MyChart Message (if you have MyChart) OR . A paper copy in the mail If you have any lab test that is abnormal or we need to change your treatment, we will call you to review the results.   Follow-Up: At [facility], you and your health needs are our priority.  As part of our continuing mission to provide you with exceptional heart care, we have created designated Provider Care Teams.  These Care Teams include your primary Cardiologist (physician) and Advanced Practice Providers (APPs -  Physician Assistants and Nurse Practitioners) who all work together to provide you with the care you need, when you need it.  We recommend signing up for the patient portal called "MyChart".  Sign up information is provided on this After Visit Summary.  MyChart is used to connect with patients for Virtual Visits (Telemedicine).  Patients are able to view lab/test results, encounter notes, upcoming appointments, etc.  Non-urgent messages can be sent to your provider as well.   To learn more about what you can do with MyChart, go to [email or web address].    Your next appointment:   3 month(s) LIPID CLINIC  The format for your next appointment:   In Person  Provider:   Raliegh Ip Mali Hilty, MD

## 2019-12-23 NOTE — Therapy (Signed)
Brandon [name], [place], [address] Phone: [phone]   Fax:  [phone]  Physical Therapy Evaluation Only  Patient Details  Name: Brandon [name] MRN: [id number] Date of Birth: [birth year] Referring Provider (PT): Jill Alexanders MD   Encounter Date: 12/23/2019  PT End of Session - 12/23/19 1642    Visit Number  1    Number of Visits  1    Date for PT Re-Evaluation  --   N/A   Authorization Type  [employer] (No auth req)    PT Start Time  1603    PT Stop Time  1630    PT Time Calculation (min)  27 min    Activity Tolerance  Patient tolerated treatment well    Behavior During Therapy  [facility] for tasks assessed/performed       Past Medical History:  Diagnosis Date  . Arteriosclerotic cardiovascular disease (ASCVD) 2006, 2012   2006-acute IMI treated with urgent RCA stent; 2007-Cutting Balloon for in-stent restenosis; 02/2010-presented with ACS and minimal troponin elevation:70% LAD, 80% distal circumflex, 80% proximal ramus branch vessel,in-stent restenosis of 70% in the RCA; BMS for proximal critical RCA stenosis, restenosis Nov 2012  . Carotid artery occlusion   . Heart attack ([place])   . History of noncompliance with medical treatment    Due to financial considerations  . Hyperlipidemia   . Hypertension   . Stroke ([place])   . Tobacco abuse    40 pack years    Past Surgical History:  Procedure Laterality Date  . CORONARY ANGIOPLASTY WITH STENT PLACEMENT    . IR ANGIO EXTERNAL CAROTID SEL EXT CAROTID BILAT MOD SED  12/15/2019  . IR ANGIO VERTEBRAL SEL VERTEBRAL UNI R MOD SED  12/15/2019  . IR ANGIOGRAM EXTREMITY LEFT  12/15/2019  . IR ANGIOGRAM EXTREMITY LEFT  12/15/2019  . IR CT HEAD LTD  12/15/2019  . IR PERCUTANEOUS ART THROMBECTOMY/INFUSION INTRACRANIAL INC DIAG ANGIO  12/15/2019  . RADIOLOGY WITH ANESTHESIA N/A 12/15/2019   Procedure: IR WITH ANESTHESIA;  Surgeon: Luanne Bras, MD;  Location: [place];   Service: Radiology;  Laterality: N/A;    There were no vitals filed for this visit.   Subjective Assessment - 12/23/19 1612    Subjective  Pt reports had "mini stroke in February"; 12/14/19. Pt reports no issues walking and trasnferring since CVA. Pt reports he has noticed issues with memory, not physical activity. Pt reports a little stiffness in his R ankle, but feels like "I'm getting more blood to them lately". Pt reports no pain or issues with balance and doesn't feel he needs PT at this time.    How long can you stand comfortably?  No issues    How long can you walk comfortably?  No issues    Diagnostic tests  L ICA occlusion w/ large penumbra    Patient Stated Goals  I don't feel like I need therapy, my issues are with my memory    Currently in Pain?  No/denies         [facility] PT Assessment - 12/23/19 1616      Assessment   Medical Diagnosis  TIA d/t left ICA occlusion    Onset Date/Surgical Date  12/14/19    Hand Dominance  Right    Next MD Visit  Next month follow up    Prior Therapy  Acute OT and PT      Precautions   Precautions  None  Restrictions   Weight Bearing Restrictions  No      Balance Screen   Has the patient fallen in the past 6 months  No    Has the patient had a decrease in activity level because of a fear of falling?   No    Is the patient reluctant to leave their home because of a fear of falling?   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      Cognition   Overall Cognitive Status  Within Functional Limits for tasks assessed      Observation/Other Assessments   Focus on Therapeutic Outcomes (FOTO)   11% limited      Sensation   Light Touch  Appears Intact    Proprioception  Appears Intact      Coordination   Finger Nose Finger Test  no deficits bilaterally    Heel Shin Test  no deficits bilaterally      Functional  Tests   Functional tests  Sit to Stand      Sit to Stand   Comments  5x STS: 8.5 sec, from chair, equal BLE weight-bearing      Strength   Strength Assessment Site  Hip;Knee;Ankle    Right Hip Flexion  4/5    Right Hip Extension  4/5    Right Hip ABduction  4/5    Left Hip Flexion  4/5    Left Hip Extension  4/5    Left Hip ABduction  4/5    Right Knee Flexion  5/5    Right Knee Extension  5/5    Left Knee Flexion  5/5    Left Knee Extension  5/5    Right Ankle Dorsiflexion  5/5    Left Ankle Dorsiflexion  5/5      Ambulation/Gait   Ambulation/Gait  Yes    Ambulation/Gait Assistance  7: Independent    Assistive device  None    Gait velocity  WFL    Gait Comments  no foot drop noted, equal bil step length, no veering noted or unsteadiness noted      Balance   Balance Assessed  Yes      Static Standing Balance   Static Standing - Balance Support  No upper extremity supported    Static Standing Balance -  Activities   Single Leg Stance - Right Leg;Single Leg Stance - Left Leg    Static Standing - Comment/# of Minutes  30+ sec bialterally, mild unsteadiness with L SLS but able to safely right trunk      Standardized Balance Assessment   Standardized Balance Assessment  Dynamic Gait Index      Dynamic Gait Index   Level Surface  Normal    Change in Gait Speed  Normal    Gait with Horizontal Head Turns  Normal    Gait with Vertical Head Turns  Normal    Gait and Pivot Turn  Normal    Step Over Obstacle  Normal    Step Around Obstacles  Normal    Steps  Normal    Total Score  24    DGI comment:  occasional handrail use with steps, but able to complete safely without      High Level Balance   High Level Balance Comments  Pt able to clear 6" and 12" hurdles safely, weave  around cones, walk backwards, and tandem gait without loss of balance or deficits noted.      Functional Gait  Assessment   Gait assessed   --         Objective measurements completed on  examination: See above findings.         PT Education - 12/23/19 1641    Education Details  Assessment findings, FOTO findings.    Person(s) Educated  Patient    Methods  Explanation    Comprehension  Verbalized understanding       PT Short Term Goals - 12/23/19 1647      PT SHORT TERM GOAL #1   Title  Pt verbalized understanding of assessment findings and demonstrates good carryover with cues for ambulation, stairs and lifting 20# objects from floor.    Time  1    Period  Days    Status  Achieved    Target Date  12/23/19             Plan - 12/23/19 1643    Clinical Impression Statement  Pt is a pleasant 48YO male coming for PT evaluation after suffering CVA on 12/14/19. Pt demonstrates good strength throughout BLE strength, no deficits with ambulation per DGI and good single limb stance balance. Pt denies pain and denies deficits at home with functional activity. Pt educated on safety at home with gait, stairs, lifting 20# objects form floor and able to demonstrate good carryover. Pt educated on seeking PT in future if balance deficits arise or R knee pain returns and verbalized understanding. Physical therapy interventions are not indicated at this time.    Stability/Clinical Decision Making  Stable/Uncomplicated    Clinical Decision Making  Low    Rehab Potential  --   N/A   PT Frequency  One time visit    PT Duration  Other (comment)   N/A   PT Treatment/Interventions  Other (comment)   Eval only   PT Next Visit Plan  Evaluation only.    Consulted and Agree with Plan of Care  Patient       Patient will benefit from skilled therapeutic intervention in order to improve the following deficits and impairments:  (Eval only)  Visit Diagnosis: Acute CVA (cerebrovascular accident) [facility])     Problem List Patient Active Problem List   Diagnosis Date Noted  . 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 BPH (benign prostatic hyperplasia) 12/17/2019  . TIA (transient  ischemic attack) d/t L brain hypoperfusion from L ICA occlusion 12/15/2019  . Stroke (cerebrum) ([place]) 12/15/2019  . Internal carotid artery occlusion, left 12/15/2019  . Penile pain 03/21/2019  . Ureteral stone 03/21/2019  . Dysuria 03/21/2019  . Abdominal pain 01/23/2019  . Genital warts 01/23/2019  . Screen for colon cancer 01/23/2019  . Rectal abnormality 01/23/2019  . History of renal calculi 01/22/2019  . Left lower quadrant abdominal pain 01/22/2019  . Hematuria 01/22/2019  . 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 Sleep apnea 12/29/2012  . Arteriosclerotic cardiovascular disease (ASCVD)   . Tobacco abuse   . History of noncompliance with medical treatment   . Hyperlipidemia 05/30/2011  . Hypertension 05/30/2011  . Morbid obesity ([place]) 05/30/2011     Talbot Grumbling PT, DPT 12/23/19, 4:50 PM [facility] [address] [place], [place], [address] Phone: [phone]   Fax:  [phone]  Name: ROBBIE DOU MRN: [id number] Date of Birth: 12-Feb-1971

## 2019-12-23 NOTE — Therapy (Signed)
Brandon [name], [place], [address] Phone: [phone]   Fax:  [phone]  Speech Language Pathology Evaluation  Patient Details  Name: Brandon [name] MRN: [id number] Date of Birth: 1971/02/22 Referring Provider (SLP): Rosalin Hawking, MD   Encounter Date: 12/23/2019  End of Session - 12/23/19 1717    Visit Number  1    Number of Visits  2    Authorization Type  [employer]    SLP Start Time  [id number]    SLP Stop Time   1600    SLP Time Calculation (min)  45 min    Activity Tolerance  Patient tolerated treatment well       Past Medical History:  Diagnosis Date  . Arteriosclerotic cardiovascular disease (ASCVD) 2006, 2012   2006-acute IMI treated with urgent RCA stent; 2007-Cutting Balloon for in-stent restenosis; 02/2010-presented with ACS and minimal troponin elevation:70% LAD, 80% distal circumflex, 80% proximal ramus branch vessel,in-stent restenosis of 70% in the RCA; BMS for proximal critical RCA stenosis, restenosis Nov 2012  . Carotid artery occlusion   . Heart attack ([place])   . History of noncompliance with medical treatment    Due to financial considerations  . Hyperlipidemia   . Hypertension   . Stroke ([place])   . Tobacco abuse    40 pack years    Past Surgical History:  Procedure Laterality Date  . CORONARY ANGIOPLASTY WITH STENT PLACEMENT    . IR ANGIO EXTERNAL CAROTID SEL EXT CAROTID BILAT MOD SED  12/15/2019  . IR ANGIO VERTEBRAL SEL VERTEBRAL UNI R MOD SED  12/15/2019  . IR ANGIOGRAM EXTREMITY LEFT  12/15/2019  . IR ANGIOGRAM EXTREMITY LEFT  12/15/2019  . IR CT HEAD LTD  12/15/2019  . IR PERCUTANEOUS ART THROMBECTOMY/INFUSION INTRACRANIAL INC DIAG ANGIO  12/15/2019  . RADIOLOGY WITH ANESTHESIA N/A 12/15/2019   Procedure: IR WITH ANESTHESIA;  Surgeon: Luanne Bras, MD;  Location: [facility];  Service: Radiology;  Laterality: N/A;    There were no vitals filed for this visit.  Subjective Assessment - 12/23/19 1709     Subjective  "I have to think about things more now."    Special Tests  SLUMS    Currently in Pain?  No/denies         SLP Evaluation [facility] - 12/23/19 1709      SLP Visit Information   SLP Received On  12/23/19    Referring Provider (SLP)  Rosalin Hawking, MD    Onset Date  12/14/2019    Medical Diagnosis  s/p CVA      General Information   HPI  [name] is a 49 y.o. male with history of CAD s/p stent, HTN, HLD, and tobacco use presenting to [facility] ED with hx stroke sx 2 days ago that completely resolved with imaging showing a completed R parietal infarct. Now with acute R sided weakness R facial droop, aphasia and dysarthria. Found to have a L ICA occlusion w/ large penumbra. Not a tPA candidate. Transferred to [facility] for IR.     Behavioral/Cognition  alert and cooperative    Mobility Status  Ambulatory      Balance Screen   Has the patient fallen in the past 6 months  No    Has the patient had a decrease in activity level because of a fear of falling?   No    Is the patient reluctant to leave their home because of a fear  of falling?   No      Prior Functional Status   Cognitive/Linguistic Baseline  Within functional limits    Type of Home  House     Lives With  Spouse    Available Support  Family    Education  high school    Vocation  Full time employment      Pain Assessment   Pain Assessment  No/denies pain      Cognition   Overall Cognitive Status  Impaired/Different from baseline    Area of Impairment  Memory;Attention;Problem solving    Current Attention Level  Sustained    Memory Comments  0/5 recall    Problem Solving  Difficulty sequencing    Attention  Sustained    Sustained Attention  Impaired    Sustained Attention Impairment  Functional complex    Memory  Impaired    Memory Impairment  Storage deficit    Problem Solving  Impaired    Problem Solving Impairment  Functional complex    Executive Function  Sequencing;Self Monitoring     Sequencing  Impaired    Self Monitoring  Impaired      Auditory Comprehension   Overall Auditory Comprehension  Appears within functional limits for tasks assessed    Yes/No Questions  Within Functional Limits    Commands  Within Functional Limits    Conversation  Complex    Interfering Components  Processing speed;Working Financial planner  Within [employer]      Reading Comprehension   Reading Status  Not tested      Expression   Primary Mode of Expression  Verbal      Verbal Expression   Overall Verbal Expression  Appears within functional limits for tasks assessed    Initiation  No impairment    Automatic Speech  Name;Social Response;Month of year    Level of Generative/Spontaneous Verbalization  Conversation    Repetition  No impairment    Naming  No impairment    Pragmatics  No impairment    Non-Verbal Means of Communication  Not applicable      Written Expression   Dominant Hand  Right    Written Expression  Not tested      Oral Motor/Sensory Function   Overall Oral Motor/Sensory Function  Appears within functional limits for tasks assessed      Motor Speech   Overall Motor Speech  Appears within functional limits for tasks assessed    Respiration  Within functional limits    Phonation  Normal    Resonance  Within functional limits    Articulation  Within functional limitis    Motor Planning  Witnin functional limits    Motor Speech Errors  Not applicable    Phonation  [facility]      Standardized Assessments   Standardized Assessments   --   SLUMS 17/30        SLP Education - 12/23/19 1716    Education Details  Plan for one treatment session    Person(s) Educated  Patient    Methods  Explanation;Handout    Comprehension  Verbalized understanding       SLP Short Term Goals - 12/23/19 1718      SLP SHORT TERM GOAL #1   Title  Pt will complete  moderately complex planning tasks with 90% acc with min cues for strategies.    Baseline  mod assist    Time  1    Period  Weeks    Status  New    Target Date  12/30/19      SLP SHORT TERM GOAL #2   Title  Pt will implement compensatory memory strategies during functional recall tasks with mi/mod assist for 90% acc.    Baseline  25%    Time  1    Period  Weeks    Status  New    Target Date  12/30/19      SLP SHORT TERM GOAL #3   Title  Pt will complete functional problem solving tasks/sequencing tasks with 90% acc with min assist.    Baseline  75%    Time  1    Period  Weeks    Status  New       SLP Long Term Goals - 12/23/19 1719      SLP LONG TERM GOAL #1   Title  Same as short       Plan - 12/23/19 1718    Clinical Impression Statement  Pt presents with mild cognitive deficits characterized by visuospatial, planning, and working memory impairments. Pt reports that it takes him longer to process information now and he feels "slower" in his thinking. He indicates that he has always had trouble with short term memory and relies on written information to aid in recall. He has definitive plans to return to work next Tuesday. He is a Geophysicist/field seismologist for [employer]. He feels like he will not have any difficulty returning to work, but needs to be cleared by his doctor. Pt was encouraged to attend at least one SLP session to target the above deficits and determine appropriate compensatory strategies as needed. Pt in agreement with will return for one SLP session later this week.    Speech Therapy Frequency  1x /week    Duration  1 week    Treatment/Interventions  Cognitive reorganization;SLP instruction and feedback;Compensatory strategies;Patient/family education;Functional tasks;Cueing hierarchy    Potential to Achieve Goals  Good    Consulted and Agree with Plan of Care  Patient       Patient will benefit from skilled therapeutic intervention in order to improve the following deficits and  impairments:   Cognitive communication deficit    Problem List Patient Active Problem List   Diagnosis Date Noted  . 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 BPH (benign prostatic hyperplasia) 12/17/2019  . TIA (transient ischemic attack) d/t L brain hypoperfusion from L ICA occlusion 12/15/2019  . Stroke (cerebrum) ([place]) 12/15/2019  . Internal carotid artery occlusion, left 12/15/2019  . Penile pain 03/21/2019  . Ureteral stone 03/21/2019  . Dysuria 03/21/2019  . Abdominal pain 01/23/2019  . Genital warts 01/23/2019  . Screen for colon cancer 01/23/2019  . Rectal abnormality 01/23/2019  . History of renal calculi 01/22/2019  . Left lower quadrant abdominal pain 01/22/2019  . Hematuria 01/22/2019  . Depression, major, in remission ([place]) 12/29/2012  . Sleep apnea 12/29/2012  . Arteriosclerotic cardiovascular disease (ASCVD)   . Tobacco abuse   . History of noncompliance with medical treatment   . Hyperlipidemia 05/30/2011  . Hypertension 05/30/2011  . Morbid obesity [facility]) 05/30/2011   Thank you,  Genene Churn, [place]  [facility] 12/24/2019, 12:35 PM  [facility] [address] [place], [place], [address] Phone: [phone]   Fax:  [phone]  Name: YOVAN MINNICK MRN: [id number] Date of Birth: Feb 22, 1971

## 2019-12-23 NOTE — Progress Notes (Signed)
Virtual Visit via Telephone Note   This visit type was conducted due to national recommendations for restrictions regarding the COVID-19 Pandemic (e.g. social distancing) in an effort to limit this patient's exposure and mitigate transmission in our community.  Due to his co-morbid illnesses, this patient is at least at moderate risk for complications without adequate follow up.  This format is felt to be most appropriate for this patient at this time.  The patient did not have access to video technology/had technical difficulties with video requiring transitioning to audio format only (telephone).  All issues noted in this document were discussed and addressed.  No physical exam could be performed with this format.  Please refer to the patient's chart for his  consent to telehealth for [facility].   Evaluation Performed: Lipid clinic evaluation  Date:  12/23/2019   ID:  Brandon [name], DOB 03/13/1971, MRN [id number]  Patient Location:  [address] [place] [address]  Provider location:   [address] [place] [address] [place], [place] [address]  PCP:  Brandon Lung, MD  Cardiologist:  Brandon Burow, MD Electrophysiologist:  None   Chief Complaint:  Manage dyslipidemia  History of Present Illness:    Brandon [name] is a 49 y.o. male who presents via audio/video conferencing for a telehealth visit today.  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  He had a right coronary artery stent and then developed some in-stent restenosis.  He also had an acute coronary syndrome in 2011 with significant progression of coronary disease but no significant obstruction.  More recently, just a few weeks ago he had a TIA event.  He was seen by Dr. Gwenlyn [name] just prior to that and recommended to have more aggressive lipid management.  There is a family history of early onset heart disease in his father who also had an MI in his 38s  suggestive of familial hyperlipidemia or perhaps elevated LP(a) as a cause of his symptoms.  This has not been assessed.  Most recently his lipid profile showed total cholesterol 155, triglycerides 152, HDL 32 and LDL 93.  His target LDL is less than 70.  We discussed various options for him however I think the best cardiovascular risk reduction given his recent TIA and recurrent coronary disease and aggressive symptoms would be a PCSK9 inhibitor.  The patient does not have symptoms concerning for COVID-19 infection (fever, chills, cough, or new SHORTNESS OF BREATH).    Prior CV studies:   The following studies were reviewed today:  Chart reviewed  PMHx:  Past Medical History:  Diagnosis Date  . Arteriosclerotic cardiovascular disease (ASCVD) 2006, 2012   2006-acute IMI treated with urgent RCA stent; 2007-Cutting Balloon for in-stent restenosis; 02/2010-presented with ACS and minimal troponin elevation:70% LAD, 80% distal circumflex, 80% proximal ramus branch vessel,in-stent restenosis of 70% in the RCA; BMS for proximal critical RCA stenosis, restenosis Nov 2012  . Carotid artery occlusion   . Heart attack ([place])   . History of noncompliance with medical treatment    Due to financial considerations  . Hyperlipidemia   . Hypertension   . Stroke ([place])   . Tobacco abuse    40 pack years    Past Surgical History:  Procedure Laterality Date  . CORONARY ANGIOPLASTY WITH STENT PLACEMENT    . IR ANGIO EXTERNAL CAROTID SEL EXT CAROTID BILAT MOD SED  12/15/2019  . IR ANGIO VERTEBRAL  SEL VERTEBRAL UNI R MOD SED  12/15/2019  . IR ANGIOGRAM EXTREMITY LEFT  12/15/2019  . IR ANGIOGRAM EXTREMITY LEFT  12/15/2019  . IR CT HEAD LTD  12/15/2019  . IR PERCUTANEOUS ART THROMBECTOMY/INFUSION INTRACRANIAL INC DIAG ANGIO  12/15/2019  . RADIOLOGY WITH ANESTHESIA N/A 12/15/2019   Procedure: IR WITH ANESTHESIA;  Surgeon: Brandon Bras, MD;  Location: [facility];  Service: Radiology;  Laterality: N/A;    FAMHx:   Family History  Problem Relation Age of Onset  . Depression Mother 52       Suicide  . Heart disease Father 59       Deceased from massive heart-attack  . Hyperlipidemia Father   . Hypertension Father   . COPD Maternal Grandfather   . Cancer Paternal Grandmother        Male Cancer  . Heart disease Paternal Grandmother     SOCHx:   reports that he quit smoking 9 days ago. His smoking use included cigarettes. He has a 27.00 pack-year smoking history. He has never used smokeless tobacco. He reports that he does not drink alcohol or use drugs.  ALLERGIES:  No Known Allergies  MEDS:  Current Meds  Medication Sig  . aspirin EC 325 MG EC tablet Take 1 tablet (325 mg total) by mouth daily.  Marland Kitchen atorvastatin (LIPITOR) 80 MG tablet Take 1 tablet (80 mg total) by mouth daily.  . clopidogrel (PLAVIX) 75 MG tablet Take 1 tablet (75 mg total) by mouth daily.     ROS: Pertinent items noted in HPI and remainder of comprehensive ROS otherwise negative.  Labs/Other Tests and Data Reviewed:    Recent Labs: 12/14/2019: ALT 38 12/17/2019: BUN 11; Creatinine, Ser 0.81; Hemoglobin 14.0; Platelets 138; Potassium 3.6; Sodium 140   Recent Lipid Panel Lab Results  Component Value Date/Time   CHOL 155 12/15/2019 03:44 AM   CHOL 215 (H) 12/04/2019 01:12 PM   TRIG 152 (H) 12/15/2019 03:44 AM   HDL 32 (L) 12/15/2019 03:44 AM   HDL 42 12/04/2019 01:12 PM   CHOLHDL 4.8 12/15/2019 03:44 AM   LDLCALC 93 12/15/2019 03:44 AM   LDLCALC 146 (H) 12/04/2019 01:12 PM    Wt Readings from Last 3 Encounters:  12/23/19 292 lb (132.5 kg)  12/18/19 291 lb (132 kg)  12/14/19 289 lb 14.5 oz (131.5 kg)     Exam:    Vital Signs:  BP (!) 150/87   Pulse 92   Ht 5\' 9"  (1.753 m)   Wt 292 lb (132.5 kg)   BMI 43.12 kg/m    Exam not performed due to telephone visit  ASSESSMENT & PLAN:    1. 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 Strong family history of premature  coronary disease 3. Recent TIA 4. History of inferior MI with residual coronary disease and prior right coronary artery stent  [name] has multiple cardiovascular risk factors and continues to have a persistently elevated LDL cholesterol.  This is in the setting of high potency atorvastatin 80 mg daily.  He would benefit from additional treatment and I think is a good candidate for PCSK9 inhibitor.  We will try to establish care with [facility].  I discussed the use of it today.  We will plan a repeat lipid profile in about 3 months and follow-up afterwards.  We will also order an LP(a) to see if this is elevated which I suspect it may be.  There is of course evidence that PCSK9  inhibitors to lower LP(a) as the statins do not.  Follow-up with me afterwards.  Thanks again for the kind referral.  COVID-19 Education: The signs and symptoms of COVID-19 were discussed with the patient and how to seek care for testing (follow up with PCP or arrange E-visit).  The importance of social distancing was discussed today.  Patient Risk:   After full review of this patients clinical status, I feel that they are at least moderate risk at this time.  Time:   Today, I have spent 25 minutes with the patient with telehealth technology discussing dyslipidemia, TIA, coronary artery disease, family history.     Medication Adjustments/Labs and Tests Ordered: Current medicines are reviewed at length with the patient today.  Concerns regarding medicines are outlined above.   Tests Ordered: Orders Placed This Encounter  Procedures  . Lipoprotein A (LPA)  . Lipid panel    Medication Changes: No orders of the defined types were placed in this encounter.   Disposition:  in 4 month(s)  Pixie Casino, MD, [facility], [place] Director of the Advanced Lipid Disorders &  Cardiovascular Risk Reduction Clinic Diplomate of the American Board of Clinical Lipidology Attending  Cardiologist  Direct Dial: [phone]  Fax: [phone]  Website:  [email or web address]  Pixie Casino, MD  12/23/2019 10:02 AM

## 2019-12-23 NOTE — Therapy (Signed)
[place], [place], [address] Phone: [phone]   Fax:  [phone]  Occupational Therapy Evaluation  Patient Details  Name: Brandon [name] MRN: [id number] Date of Birth: [birth year] Referring Provider (OT): Dr. Erlinda Hong   Encounter Date: 12/23/2019  OT End of Session - 12/23/19 1541    Visit Number  1    Number of Visits  1    Authorization Type  [employer]    Authorization Time Period  3 free visits covered before coverage starts over and patient must pay out of pocket.01/22/19-01/21/20 coverage period.    OT Start Time  1430    OT Stop Time  1458    OT Time Calculation (min)  28 min    Activity Tolerance  Patient tolerated treatment well    Behavior During Therapy  WFL for tasks assessed/performed       Past Medical History:  Diagnosis Date  . Arteriosclerotic cardiovascular disease (ASCVD) 2006, 2012   2006-acute IMI treated with urgent RCA stent; 2007-Cutting Balloon for in-stent restenosis; 02/2010-presented with ACS and minimal troponin elevation:70% LAD, 80% distal circumflex, 80% proximal ramus branch vessel,in-stent restenosis of 70% in the RCA; BMS for proximal critical RCA stenosis, restenosis Nov 2012  . Carotid artery occlusion   . Heart attack ([place])   . History of noncompliance with medical treatment    Due to financial considerations  . Hyperlipidemia   . Hypertension   . Stroke ([address])   . Tobacco abuse    40 pack years    Past Surgical History:  Procedure Laterality Date  . CORONARY ANGIOPLASTY WITH STENT PLACEMENT    . IR ANGIO EXTERNAL CAROTID SEL EXT CAROTID BILAT MOD SED  12/15/2019  . IR ANGIO VERTEBRAL SEL VERTEBRAL UNI R MOD SED  12/15/2019  . IR ANGIOGRAM EXTREMITY LEFT  12/15/2019  . IR ANGIOGRAM EXTREMITY LEFT  12/15/2019  . IR CT HEAD LTD  12/15/2019  . IR PERCUTANEOUS ART THROMBECTOMY/INFUSION INTRACRANIAL INC DIAG ANGIO  12/15/2019  . RADIOLOGY WITH ANESTHESIA N/A 12/15/2019   Procedure: IR WITH ANESTHESIA;  Surgeon: Luanne Bras, MD;  Location: [facility];  Service: Radiology;  Laterality: N/A;    There were no vitals filed for this visit.  Subjective Assessment - 12/23/19 1435    Subjective   S: I had some vision issues before the stroke. I need to get reading glasses.    Pertinent History  Patient is a 49 y/o male S/P TIA from Left ICA occlusion which was sustained on 12/14/19. Patient was discharged home and did not receive any additional therapies. Dr. Erlinda Hong has referred patient to occupational therapy for evaluation and treatment.    Patient Stated Goals  None stated.    Currently in Pain?  No/denies        [facility] OT Assessment - 12/23/19 1437      Assessment   Medical Diagnosis  TIA d/t left ICA occlusion    Referring Provider (OT)  Dr. 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       Mobility   Mobility Status  Independent      Written Expression   Dominant Hand  Right      Vision - History   Baseline Vision  No visual deficits    Patient Visual Report  --   Blurry vision     Cognition   Overall Cognitive Status  Within Functional Limits for tasks assessed      Sensation   Light Touch  Appears Intact    Stereognosis  Appears Intact    Hot/Cold  Appears Intact    Proprioception  Appears Intact      Coordination   9 Hole Peg Test  Right;Left    Right 9 Hole  Peg Test  25.7"    Left 9 Hole Peg Test  30.2"      ROM / Strength   AROM / PROM / Strength  AROM;Strength      Strength   Strength Assessment Site  Hand;Shoulder;Elbow;Forearm;Wrist    Right/Left Shoulder  Right    Right Shoulder Flexion  5/5    Right Shoulder ABduction  5/5    Right Shoulder Internal Rotation  5/5    Right Shoulder External Rotation  5/5    Right/Left Elbow  Right    Right Elbow Flexion  5/5    Right Elbow Extension  5/5    Right/Left Forearm  Right    Right Forearm Pronation  5/5    Right Forearm Supination  5/5    Right/Left Wrist  Right    Right Wrist Flexion  5/5    Right Wrist Extension  5/5    Right/Left hand  Right;Left    Right Hand Gross Grasp  Functional    Right Hand Grip (lbs)  105    Right Hand Lateral Pinch  23 lbs    Right Hand 3 Point Pinch  22 lbs    Left Hand Gross Grasp  Functional    Left Hand Grip (lbs)  92    Left Hand Lateral Pinch  23 lbs    Left Hand 3 Point Pinch  20 lbs                      OT Education - 12/23/19 1541    Education Details  Reviewed evaluation findings.    Person(s) Educated  Patient    Methods  Explanation    Comprehension  Verbalized understanding                 Plan - 12/23/19 1543    Clinical Impression Statement  A: Patient is a 49 y/o male S/P TIA with reports of no difficulty performing ADL tasks, fine and gross motor skills, and ROM of the RUE. Patient was assessed and is functioning at baseline. No HEP was needed. Pt does not require any additional OT services at this time.    OT Occupational Profile and History  Problem Focused Assessment - Including review of records relating to presenting problem    Rehab Potential  Excellent    Clinical Decision Making  Limited treatment options,  no task modification necessary    Comorbidities Affecting Occupational Performance:  May have comorbidities impacting occupational performance    Modification or Assistance to Complete  Evaluation   No modification of tasks or assist necessary to complete eval    OT Frequency  One time visit    OT Treatment/Interventions  Patient/family education    Plan  P: 1 time visit. No further OT services needed at this time.    OT Home Exercise Plan  N/A    Consulted and Agree with Plan of Care  Patient       Patient will benefit from skilled therapeutic intervention in order to improve the following deficits and impairments:           Visit Diagnosis: Other symptoms and signs involving the musculoskeletal system - Plan: Ot plan of care cert/re-cert    Problem List Patient Active Problem List   Diagnosis Date Noted  . 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 BPH (benign prostatic hyperplasia) 12/17/2019  . TIA (transient ischemic attack) d/t L brain hypoperfusion from L ICA occlusion 12/15/2019  . Stroke (cerebrum) ([place]) 12/15/2019  . Internal carotid artery occlusion, left 12/15/2019  . Penile pain 03/21/2019  . Ureteral stone 03/21/2019  . Dysuria 03/21/2019  . Abdominal pain 01/23/2019  . Genital warts 01/23/2019  . Screen for colon cancer 01/23/2019  . Rectal abnormality 01/23/2019  . History of renal calculi 01/22/2019  . Left lower quadrant abdominal pain 01/22/2019  . Hematuria 01/22/2019  . Depression, major, in remission ([place]) 12/29/2012  . Sleep apnea 12/29/2012  . Arteriosclerotic cardiovascular disease (ASCVD)   . Tobacco abuse   . History of noncompliance with medical treatment   . Hyperlipidemia 05/30/2011  . Hypertension 05/30/2011  . Morbid obesity [facility]) 05/30/2011   Ailene Ravel, OTR/L,CBIS  [phone]  12/23/2019, 3:46 PM  [facility] [address] [place], [place], [address] Phone: [phone]   Fax:  [phone]  Name: Brandon [name] MRN: [id number] Date of Birth: [birth year]

## 2019-12-24 ENCOUNTER — Telehealth: Payer: Self-pay | Admitting: Internal Medicine

## 2019-12-24 NOTE — Telephone Encounter (Signed)
PA for Repatha Sureclick submitted via CMM (Key: [id number])

## 2019-12-25 ENCOUNTER — Encounter (HOSPITAL_COMMUNITY): Payer: Self-pay | Admitting: Speech Pathology

## 2019-12-25 ENCOUNTER — Other Ambulatory Visit: Payer: Self-pay

## 2019-12-25 ENCOUNTER — Ambulatory Visit (HOSPITAL_COMMUNITY): Payer: 59 | Admitting: Speech Pathology

## 2019-12-25 DIAGNOSIS — R41841 Cognitive communication deficit: Secondary | ICD-10-CM

## 2019-12-25 DIAGNOSIS — R29898 Other symptoms and signs involving the musculoskeletal system: Secondary | ICD-10-CM | POA: Diagnosis not present

## 2019-12-25 NOTE — Therapy (Signed)
[place], [place], [address] Phone: [phone]   Fax:  [phone]  Speech Language Pathology Treatment  Patient Details  Name: Brandon [name] MRN: [id number] Date of Birth: 08-24-1971 Referring Provider (SLP): Rosalin Hawking, MD   Encounter Date: 12/25/2019  End of Session - 12/25/19 1033    Visit Number  2    Number of Visits  2    Authorization Type  [employer]    SLP Start Time  0945    SLP Stop Time   1030    SLP Time Calculation (min)  45 min    Activity Tolerance  Patient tolerated treatment well       Past Medical History:  Diagnosis Date  . Arteriosclerotic cardiovascular disease (ASCVD) 2006, 2012   2006-acute IMI treated with urgent RCA stent; 2007-Cutting Balloon for in-stent restenosis; 02/2010-presented with ACS and minimal troponin elevation:70% LAD, 80% distal circumflex, 80% proximal ramus branch vessel,in-stent restenosis of 70% in the RCA; BMS for proximal critical RCA stenosis, restenosis Nov 2012  . Carotid artery occlusion   . Heart attack ([place])   . History of noncompliance with medical treatment    Due to financial considerations  . Hyperlipidemia   . Hypertension   . Stroke ([place])   . Tobacco abuse    40 pack years    Past Surgical History:  Procedure Laterality Date  . CORONARY ANGIOPLASTY WITH STENT PLACEMENT    . IR ANGIO EXTERNAL CAROTID SEL EXT CAROTID BILAT MOD SED  12/15/2019  . IR ANGIO VERTEBRAL SEL VERTEBRAL UNI R MOD SED  12/15/2019  . IR ANGIOGRAM EXTREMITY LEFT  12/15/2019  . IR ANGIOGRAM EXTREMITY LEFT  12/15/2019  . IR CT HEAD LTD  12/15/2019  . IR PERCUTANEOUS ART THROMBECTOMY/INFUSION INTRACRANIAL INC DIAG ANGIO  12/15/2019  . RADIOLOGY WITH ANESTHESIA N/A 12/15/2019   Procedure: IR WITH ANESTHESIA;  Surgeon: Luanne Bras, MD;  Location: [facility];  Service: Radiology;  Laterality: N/A;    There were no vitals filed for this visit.  Subjective Assessment - 12/25/19 0959     Subjective  "I would have been able to do this faster before."    Currently in Pain?  No/denies        ADULT SLP TREATMENT - 12/25/19 0959      General Information   Behavior/Cognition  Alert;Cooperative;Pleasant mood    Patient Positioning  Upright in chair    Oral care provided  N/A    HPI  [name] is a 49 y.o. male with history of CAD s/p stent, HTN, HLD, and tobacco use presenting [facility] ED with hx stroke sx 2 days ago that completely resolved with imaging showing a completed R parietal infarct. Now with acute R sided weakness R facial droop, aphasia and dysarthria. Found to have a L ICA occlusion w/ large penumbra. Not a tPA candidate. Transferred to [facility] for IR.       Treatment Provided   Treatment provided  Cognitive-Linquistic      Pain Assessment   Pain Assessment  No/denies pain      Cognitive-Linquistic Treatment   Treatment focused on  Cognition;Patient/family/caregiver education    Skilled Treatment  SLP provided skilled treatment targeting goals stated below with an emphasis on further assessment of inconsistent deficits noted during the evaluation and initiation of compensatory strategies for planning and calculation tasks.      Assessment / Recommendations / Plan   Plan  All goals met;Discharge SLP treatment due to (comment)      Progression Toward Goals   Progression toward goals  Goals met, education completed, patient discharged from SLP       SLP Education - 12/25/19 1031    Education Details  Provided written memory strategies and HEP targeting visuospatial construction and calculations    Person(s) Educated  Patient    Methods  Explanation;Handout    Comprehension  Verbalized understanding       SLP Short Term Goals - 12/25/19 1033      SLP SHORT TERM GOAL #1   Title  Pt will complete moderately complex planning tasks with 90% acc with min cues for strategies.    Baseline  mod assist    Time  1    Period  Weeks     Status  Achieved    Target Date  12/30/19      SLP SHORT TERM GOAL #2   Title  Pt will implement compensatory memory strategies during functional recall tasks with mi/mod assist for 90% acc.    Baseline  25%    Time  1    Period  Weeks    Status  Achieved    Target Date  12/30/19      SLP SHORT TERM GOAL #3   Title  Pt will complete functional problem solving tasks/sequencing tasks with 90% acc with min assist.    Baseline  75%    Time  1    Period  Weeks    Status  Achieved       SLP Long Term Goals - 12/25/19 1034      SLP LONG TERM GOAL #1   Title  Same as short       Plan - 12/25/19 1033    Clinical Impression Statement Pt seen for one treatment session following the evaluation as Pt plans to return to work early next week. He completed visuospatial identification of "which shape is different?" with 100% acc and copied shapes with 95% acc. He completed calculation tasks with use of written self prompts with 92% acc with cues for error awareness. He completed oral sequencing tasks (via written prompt) with 100% acc. SLP provided information regarding memory, attention, and planning exercises. Pt cued to "stop and think", ask "Am I focused", and rehearse via visualization or silent verbal rehearsal before completing tasks. Pt feels positive about returning to work. He was encouraged to reconnect with SLP should he have any issues from a cognitive communication standpoint once he returns to work.    Speech Therapy Frequency  1x /week    Duration  1 week    Treatment/Interventions  Cognitive reorganization;SLP instruction and feedback;Compensatory strategies;Patient/family education;Functional tasks;Cueing hierarchy    Potential to Achieve Goals  Good    Consulted and Agree with Plan of Care  Patient       Patient will benefit from skilled therapeutic intervention in order to improve the following deficits and impairments:   Cognitive communication deficit    Problem  List Patient Active Problem List   Diagnosis Date Noted  . 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 BPH (benign prostatic hyperplasia) 12/17/2019  . TIA (transient ischemic attack) d/t L brain hypoperfusion from L ICA occlusion 12/15/2019  . Stroke (cerebrum) ([place]) 12/15/2019  . Internal carotid artery occlusion, left 12/15/2019  . Penile pain 03/21/2019  . Ureteral stone 03/21/2019  . Dysuria 03/21/2019  . Abdominal pain 01/23/2019  . Genital warts 01/23/2019  . Screen for  colon cancer 01/23/2019  . Rectal abnormality 01/23/2019  . History of renal calculi 01/22/2019  . Left lower quadrant abdominal pain 01/22/2019  . Hematuria 01/22/2019  . Depression, major, in remission ([place]) 12/29/2012  . Sleep apnea 12/29/2012  . Arteriosclerotic cardiovascular disease (ASCVD)   . Tobacco abuse   . History of noncompliance with medical treatment   . Hyperlipidemia 05/30/2011  . Hypertension 05/30/2011  . Morbid obesity ([place]) 05/30/2011    SPEECH THERAPY DISCHARGE SUMMARY  Visits from Start of Care: 2  Current functional level related to goals / functional outcomes: See above   Remaining deficits: See above   Education / Equipment: complete  Plan: Patient agrees to discharge.  Patient goals were met. Patient is being discharged due to meeting the stated rehab goals.  ?????        Thank you,  Genene Churn, [place]  [facility] 12/25/2019, 10:34 AM  [facility] [address] [place], [place], [address] Phone: [phone]   Fax:  [phone]   Name: Brandon [name] MRN: [id number] Date of Birth: Jun 20, 1971

## 2019-12-26 NOTE — Telephone Encounter (Signed)
  This request was denied because you did not meet the following clinical requirements: Based on the information provided, you do not meet the established medication-specific criteria or guidelines for Repatha Sureclick at this time. The request for coverage for Repatha sureclick 140mg /ml autoinjector solution for injection, use as directed (2 per month), is denied. This decision is based on health plan criteria for Repatha. This medicine is covered only if: (1) Submission of medical records (for example: chart notes, laboratory values) documenting one of the following: (A) You have been receiving at least 12 consecutive weeks of high intensity statin therapy [i.e., atorvastatin 40-80 mg, rosuvastatin 20-40 mg] and will continue to receive a high intensity statin at maximally tolerated dose. (B) Both of the following: (i) You are unable to tolerate high-intensity statin as evidenced by one of the following intolerable and persistent (i.e. more than 2 weeks) symptoms: (a) Myalgia (muscle symptoms without creatine kinase elevations). (b) Myositis (muscle symptoms with creatine kinase elevations less than 10 times upper limit of normal [ULN]). (ii) One of the following: (a) You have been receiving at least 12 consecutive weeks of moderate-intensity [i.e., atorvastatin 10-20 mg, rosuvastatin 5-10 mg, simvastatin greater than or equal to 20 mg, pravastatin greater than or equal to 40 mg, lovastatin 40 mg, Lescol XL (fluvastatin XL) 80 mg, fluvastatin 40 mg twice daily or Livalo (pitavastatin) greater than or equal to 2 mg] and will continue to receive a moderateintensity statin at maximally tolerated dose. (b) You have been receiving at least 12 consecutive weeks of low-intensity [i.e. simvastatin 10 mg, pravastatin 10-20 mg, lovastatin 20 mg, fluvastatin 20-40 mg, or Livalo (pitavastatin) 1 mg] statin therapy and will continue to receive a low-intensity statin at maximally tolerated dose. (C)  You are unable to tolerate low or moderate-, and high-intensity statins as evidenced by one of the following: (i) One of the following intolerable and persistent (i.e. more than 2 weeks) symptoms for low or moderate-, and high-intensity statins: (a) Myalgia (muscle symptoms without creatine kinase elevations). (b) Myositis (muscle symptoms with creatine kinase elevations less than 10 times upper limit of normal [ULN]). (ii) You have a labeled contraindication to all statins as documented in medical records. (iii) You have experienced rhabdomyolysis or muscle symptoms with statin treatment with creatine kinase elevations greater than 10 times ULN. (2) One of the following: (A) Submission of medical record (for example: laboratory values) documenting the following lowdensity lipoprotein cholesterol (LDL-C) values while on maximally tolerated lipid lowering therapy for a minimum of at least 12 weeks within the last 120 days: (i) LDL-C greater than or equal to 100 mg/dL. (B) Both of the following: (i) Submission of medical record (for example: laboratory values) documenting the following lowdensity lipoprotein cholesterol (LDL-C) values while on maximally tolerated lipid lowering therapy for a minimum of at least 12 weeks within the last 120 days: (a) LDL-C between 70 mg/dL and 99 mg/dL. (ii) Submission of medical record (for example: chart notes, laboratory values) documenting one of the following: (a) You have been receiving at least 12 consecutive weeks of ezetimibe (Zetia) therapy as adjunct to maximally tolerated statin therapy. (b) You have a history of contraindication, or intolerance to ezetimibe.    - when attempting PA request, questions regarding zetia were common, which it does not appear patient has tried/failed or is currently on

## 2019-12-29 ENCOUNTER — Ambulatory Visit (INDEPENDENT_AMBULATORY_CARE_PROVIDER_SITE_OTHER): Payer: 59 | Admitting: Family Medicine

## 2019-12-29 ENCOUNTER — Encounter: Payer: Self-pay | Admitting: Family Medicine

## 2019-12-29 ENCOUNTER — Ambulatory Visit (HOSPITAL_COMMUNITY)
Admission: RE | Admit: 2019-12-29 | Discharge: 2019-12-29 | Disposition: A | Discharge status: Home / Self Care | Payer: 59 | Source: Ambulatory Visit | Attending: Physician Assistant | Admitting: Physician Assistant

## 2019-12-29 ENCOUNTER — Other Ambulatory Visit: Payer: Self-pay

## 2019-12-29 VITALS — BP 152/74 | HR 101 | Temp 98.2°F | Wt 299.2 lb

## 2019-12-29 DIAGNOSIS — Z720 Tobacco use: Secondary | ICD-10-CM

## 2019-12-29 DIAGNOSIS — E782 Mixed hyperlipidemia: Secondary | ICD-10-CM

## 2019-12-29 DIAGNOSIS — I10 Essential (primary) hypertension: Secondary | ICD-10-CM | POA: Diagnosis not present

## 2019-12-29 DIAGNOSIS — I2510 Atherosclerotic heart disease of native coronary artery without angina pectoris: Secondary | ICD-10-CM | POA: Diagnosis not present

## 2019-12-29 DIAGNOSIS — I63232 Cerebral infarction due to unspecified occlusion or stenosis of left carotid arteries: Secondary | ICD-10-CM | POA: Diagnosis not present

## 2019-12-29 DIAGNOSIS — I6522 Occlusion and stenosis of left carotid artery: Secondary | ICD-10-CM

## 2019-12-29 NOTE — Patient Instructions (Signed)
The next time you get stressed out and have the urge to smoke go outside and start taking deep breaths

## 2019-12-29 NOTE — Progress Notes (Signed)
Chief Complaint: Patient was seen in consultation today for left ICA occlusion.  Referring Physician(s): Code Stroke- Kerney Elbe  Supervising Physician: Luanne Bras  Patient Status: [facility] - Out-pt  History of Present Illness: Brandon [name] is a 49 y.o. male with a past medical history as below, with pertinent past medical history including hypertension, hyperlipidemia, TIA 11/2019, MI 2006, CAD s/p stent placement 2006 and angioplasty 2007, and tobacco abuse. He is known to [facility] and has been followed by Dr. Estanislado Pandy since 11/2019. He first presented to our department as an acute code stroke at the request of Dr. Cheral Marker. 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 Estanislado Pandy. Following this procedure, patient's condition deteriorated (worsening dysarthria and right-sided weakness) and he underwent another image-guided cerebral arteriogram with unsuccessful revascularization of left ICA occlusion. He was discharged home 12/17/2019 in stable condition.  Patient presents today for follow-up regarding his left ICA occlusion. Patient awake and alert sitting in chair with no complaints at this time. Denies headache, weakness, numbness/tingling, dizziness, vision changes, hearing changes, tinnitus, or speech difficulty.  Currently taking Plavix 75 mg once daily and Aspirin 325 mg once daily.   Past Medical History:  Diagnosis Date  . Arteriosclerotic cardiovascular disease (ASCVD) 2006, 2012   2006-acute IMI treated with urgent RCA stent; 2007-[place] for in-stent restenosis; 02/2010-presented with ACS and minimal troponin elevation:70% LAD, 80% distal circumflex, 80% proximal ramus branch vessel,in-stent restenosis of 70% in the RCA; BMS for proximal critical RCA stenosis, restenosis Nov 2012  . Carotid artery occlusion   .  Heart attack ([place])   . History of noncompliance with medical treatment    Due to financial considerations  . Hyperlipidemia   . Hypertension   . Stroke ([place])   . Tobacco abuse    40 pack years    Past Surgical History:  Procedure Laterality Date  . CORONARY ANGIOPLASTY WITH STENT PLACEMENT    . IR ANGIO EXTERNAL CAROTID SEL EXT CAROTID BILAT MOD SED  12/15/2019  . IR ANGIO VERTEBRAL SEL VERTEBRAL UNI R MOD SED  12/15/2019  . IR ANGIOGRAM EXTREMITY LEFT  12/15/2019  . IR ANGIOGRAM EXTREMITY LEFT  12/15/2019  . IR CT HEAD LTD  12/15/2019  . IR PERCUTANEOUS ART THROMBECTOMY/INFUSION INTRACRANIAL INC DIAG ANGIO  12/15/2019  . RADIOLOGY WITH ANESTHESIA N/A 12/15/2019   Procedure: IR WITH ANESTHESIA;  Surgeon: Luanne Bras, MD;  Location: [place];  Service: Radiology;  Laterality: N/A;    Allergies: Patient has no known allergies.  Medications: Prior to Admission medications   Medication Sig Start Date End Date Taking? Authorizing Provider  aspirin EC 325 MG EC tablet Take 1 tablet (325 mg total) by mouth daily. 12/18/19 03/17/20  Donzetta Starch, NP  atorvastatin (LIPITOR) 80 MG tablet Take 1 tablet (80 mg total) by mouth daily. 10/31/19   Denita Lung, MD  clopidogrel (PLAVIX) 75 MG tablet Take 1 tablet (75 mg total) by mouth daily. 12/18/19   Donzetta Starch, NP     Family History  Problem Relation Age of Onset  . Depression Mother 50       Suicide  . Heart disease Father 65       Deceased from massive heart-attack  . Hyperlipidemia Father   . Hypertension Father   . COPD Maternal Grandfather   . Cancer Paternal Grandmother  Male Cancer  . Heart disease Paternal Grandmother     Social History   Socioeconomic History  . Marital status: Married    Spouse name: Not on file  . Number of children: 2  . Years of education: Not on file  . Highest education level: Not on file  Occupational History  . Occupation: Disabled    Comment: Previously employed as an Oncologist  . Smoking status: Former Smoker    Packs/day: 1.00    Years: 27.00    Pack years: 27.00    Types: Cigarettes    Quit date: 12/14/2019    Years since quitting: 0.0  . Smokeless tobacco: Never Used  Substance and Sexual Activity  . Alcohol use: No    Comment:  Alcoholism- quit 2002  . Drug use: No  . Sexual activity: Not on file  Other Topics Concern  . Not on file  Social History Narrative            Social Determinants of Health   Financial Resource Strain:   . Difficulty of Paying Living Expenses: Not on file  Food Insecurity:   . Worried About Charity fundraiser in the Last Year: Not on file  . Ran Out of Food in the Last Year: Not on file  Transportation Needs:   . Lack of Transportation (Medical): Not on file  . Lack of Transportation (Non-Medical): Not on file  Physical Activity:   . Days of Exercise per Week: Not on file  . Minutes of Exercise per Session: Not on file  Stress:   . Feeling of Stress : Not on file  Social Connections:   . Frequency of Communication with Friends and Family: Not on file  . Frequency of Social Gatherings with Friends and Family: Not on file  . Attends Religious Services: Not on file  . Active Member of Clubs or Organizations: Not on file  . Attends Archivist Meetings: Not on file  . Marital Status: Not on file     Review of Systems: A 12 point ROS discussed and pertinent positives are indicated in the HPI above.  All other systems are negative.  Review of Systems  Constitutional: Negative for chills and fever.  HENT: Negative for hearing loss and tinnitus.   Eyes: Negative for visual disturbance.  Respiratory: Negative for shortness of breath and wheezing.   Cardiovascular: Negative for chest pain and palpitations.  Neurological: Negative for dizziness, speech difficulty, weakness, numbness and headaches.  Psychiatric/Behavioral: Negative for behavioral problems and confusion.    Vital  Signs: There were no vitals taken for this visit.  Physical Exam Constitutional:      General: He is not in acute distress. Pulmonary:     Effort: Pulmonary effort is normal. No respiratory distress.  Skin:    General: Skin is warm and dry.  Neurological:     Mental Status: He is alert and oriented to person, place, and time.      Imaging: CT Angio Head W or Wo Contrast  Result Date: 12/15/2019 CLINICAL DATA:  Slurred speech.  Right-sided facial droop EXAM: CT ANGIOGRAPHY HEAD AND NECK CT PERFUSION BRAIN TECHNIQUE: Multidetector CT imaging of the head and neck was performed using the standard protocol during bolus administration of intravenous contrast. Multiplanar CT image reconstructions and MIPs were obtained to evaluate the vascular anatomy. Carotid stenosis measurements (when applicable) are obtained utilizing NASCET criteria, using the distal internal carotid diameter as the denominator. Multiphase  CT imaging of the brain was performed following IV bolus contrast injection. Subsequent parametric perfusion maps were calculated using RAPID software. CONTRAST:  124mL OMNIPAQUE IOHEXOL 350 MG/ML SOLN COMPARISON:  Head CT earlier same day. FINDINGS: CTA NECK FINDINGS Aortic arch: Aortic atherosclerosis. Right carotid system: Common carotid artery widely patent to the bifurcation region. Soft and calcified plaque at the carotid bifurcation and ICA bulb. No measurable stenosis however. Cervical ICA widely patent beyond bulb. Left carotid system: Common carotid artery shows intimal thickening but is widely patent to the bifurcation. There is occlusion of the ICA at the bifurcation. No reconstituted flow in the cervical region. ECA widely patent. Vertebral arteries: Right subclavian artery is widely patent. Right vertebral artery is widely patent through the cervical region. Left subclavian artery is occluded just beyond its origin, with reconstituted flow likely due to retrograde flow in the left  vertebral artery. Skeleton: Negative Other neck: No mass or lymphadenopathy. Upper chest: Upper lungs are clear. Review of the MIP images confirms the above findings CTA HEAD FINDINGS Anterior circulation: Right internal carotid artery is patent through the skull base and siphon region. No antegrade flow in the left ICA at the skull base level. There is reconstituted flow in the siphon, probably with contributions from external to internal collaterals, patent anterior communicating artery and patent posterior communicating artery at least on the left. Right anterior and middle cerebral vessels appear widely patent. Left anterior and middle cerebral vessels show flow. No large or medium vessel occlusion is identified. Posterior circulation: Both vertebral arteries show flow at the foramen magnum with presumed retrograde flow on the left. Basilar artery appears normal. Superior cerebellar and posterior cerebral arteries appear normal. Venous sinuses: Patent and normal. Anatomic variants: None other significant. Review of the MIP images confirms the above findings CT Brain Perfusion Findings: ASPECTS: 9 CBF (<30%) Volume: 79mL Perfusion (Tmax>6.0s) volume: 253mL Mismatch Volume: 250mL Infarction Location:Left parietal vertex as shown by CT IMPRESSION: Occlusion of the left internal carotid artery at the bifurcation. Reconstituted flow in the left carotid siphon likely due to external to internal collaterals, patent anterior communicating artery and patent posterior communicating artery on the left. No large or medium vessel intracranial occlusion is identified. 6 cc region of completed infarction at the left parietal vertex. 208 cc region of T-max greater than 6 seconds throughout the left MCA territory due to the reconstituted flow described above. Proximal left subclavian artery occlusion. Likely subclavian steal with retrograde flow suspected within the left vertebral artery, reconstituting the left subclavian artery  beyond that. These results were called by telephone at the time of interpretation on 12/15/2019 at 12:04 am to provider Veryl Speak , who verbally acknowledged these results. Electronically Signed   By: Nelson Chimes M.D.   On: 12/15/2019 00:08   CT HEAD WO CONTRAST  Result Date: 12/15/2019 CLINICAL DATA:  Subacute neuro deficit EXAM: CT HEAD WITHOUT CONTRAST TECHNIQUE: Contiguous axial images were obtained from the base of the skull through the vertex without intravenous contrast. COMPARISON:  Yesterday FINDINGS: Brain: 2 small areas of cytotoxic edema along the left parietal and superior frontal convexities, mildly progressed from before. No hemorrhage, hydrocephalus, or midline shift. Vascular: There is still circulating intravascular contrast. No vessel asymmetry. 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 No hemorrhagic conversion. Electronically Signed   By: Monte Fantasia M.D.   On: 12/15/2019 04:52   CT Angio Neck W and/or Wo Contrast  Result  Date: 12/15/2019 CLINICAL DATA:  Slurred speech.  Right-sided facial droop EXAM: CT ANGIOGRAPHY HEAD AND NECK CT PERFUSION BRAIN TECHNIQUE: Multidetector CT imaging of the head and neck was performed using the standard protocol during bolus administration of intravenous contrast. Multiplanar CT image reconstructions and MIPs were obtained to evaluate the vascular anatomy. Carotid stenosis measurements (when applicable) are obtained utilizing NASCET criteria, using the distal internal carotid diameter as the denominator. Multiphase CT imaging of the brain was performed following IV bolus contrast injection. Subsequent parametric perfusion maps were calculated using RAPID software. CONTRAST:  146mL OMNIPAQUE IOHEXOL 350 MG/ML SOLN COMPARISON:  Head CT earlier same day. FINDINGS: CTA NECK FINDINGS Aortic arch: Aortic atherosclerosis. Right carotid system: Common carotid  artery widely patent to the bifurcation region. Soft and calcified plaque at the carotid bifurcation and ICA bulb. No measurable stenosis however. Cervical ICA widely patent beyond bulb. Left carotid system: Common carotid artery shows intimal thickening but is widely patent to the bifurcation. There is occlusion of the ICA at the bifurcation. No reconstituted flow in the cervical region. ECA widely patent. Vertebral arteries: Right subclavian artery is widely patent. Right vertebral artery is widely patent through the cervical region. Left subclavian artery is occluded just beyond its origin, with reconstituted flow likely due to retrograde flow in the left vertebral artery. Skeleton: Negative Other neck: No mass or lymphadenopathy. Upper chest: Upper lungs are clear. Review of the MIP images confirms the above findings CTA HEAD FINDINGS Anterior circulation: Right internal carotid artery is patent through the skull base and siphon region. No antegrade flow in the left ICA at the skull base level. There is reconstituted flow in the siphon, probably with contributions from external to internal collaterals, patent anterior communicating artery and patent posterior communicating artery at least on the left. Right anterior and middle cerebral vessels appear widely patent. Left anterior and middle cerebral vessels show flow. No large or medium vessel occlusion is identified. Posterior circulation: Both vertebral arteries show flow at the foramen magnum with presumed retrograde flow on the left. Basilar artery appears normal. Superior cerebellar and posterior cerebral arteries appear normal. Venous sinuses: Patent and normal. Anatomic variants: None other significant. Review of the MIP images confirms the above findings CT Brain Perfusion Findings: ASPECTS: 9 CBF (<30%) Volume: 61mL Perfusion (Tmax>6.0s) volume: 269mL Mismatch Volume: 217mL Infarction Location:Left parietal vertex as shown by CT IMPRESSION: Occlusion of  the left internal carotid artery at the bifurcation. Reconstituted flow in the left carotid siphon likely due to external to internal collaterals, patent anterior communicating artery and patent posterior communicating artery on the left. No large or medium vessel intracranial occlusion is identified. 6 cc region of completed infarction at the left parietal vertex. 208 cc region of T-max greater than 6 seconds throughout the left MCA territory due to the reconstituted flow described above. 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 These results were called by telephone at the time of interpretation on 12/15/2019 at 12:04 am to provider Veryl Speak , who verbally acknowledged these results. Electronically Signed   By: Nelson Chimes M.D.   On: 12/15/2019 00:08   IR Angiogram Extremity Left  Result Date: 12/15/2019 INDICATION: Worsening neurological symptoms with dysarthria, right-sided numbness and weakness with difficulty with expression.  EXAM: 1. EMERGENT LARGE VESSEL OCCLUSION THROMBOLYSIS anterior CIRCULATION)  COMPARISON:  Diagnostic catheter arteriogram of December 14, 2019.  MEDICATIONS: Ancef 2 g  IV antibiotic was administered within 1 hour of the procedure.  ANESTHESIA/SEDATION: General anesthesia.  CONTRAST:  Isovue 300 approximately 60 mL.  FLUOROSCOPY TIME:  Fluoroscopy Time: 52 minutes 30 seconds (2331 mGy).  COMPLICATIONS: None immediate.  TECHNIQUE: Following a full explanation of the procedure along with the potential associated complications, an informed witnessed consent was obtained patient and spouse. The risks of intracranial hemorrhage of 10%, worsening neurological deficit, ventilator dependency, death and inability to revascularize were all reviewed in detail with the patient's spouse.  The patient was then put under general anesthesia by  the Department of Anesthesiology at [facility].  The right groin was prepped and draped in the usual sterile fashion. Thereafter using modified Seldinger technique, transfemoral access into the right common femoral artery was obtained without difficulty. Over a 0.035 inch guidewire a 8 French 23 cm Brite tip sheath was inserted. Through this, and also over a 0.035 inch guidewire a 5 [place] JB 1 catheter was advanced to the aortic arch region and selectively positioned in the left common carotid artery and the left subclavian artery.  FINDINGS: The left common carotid arteriogram continues to demonstrate angiographically occluded left internal carotid artery at the bulb without evidence of a delayed string sign.  Reconstitution of the distal left internal carotid artery and the cavernous and supraclinoid segment is again demonstrated from the left external carotid artery collaterals via the ophthalmic artery.  PROCEDURE: The diagnostic JB 1 catheter in the left internal carotid artery was exchanged over a 0.035 inch 300 cm Rosen exchange guidewire for a 95 cm 0.087 balloon guide catheter which had been prepped with 50% contrast and 50% heparinized saline infusion.  The balloon guide was then advanced just proximal to the left common carotid artery bifurcation. The guidewire was removed. Good aspiration was obtained from the hub of the balloon guide catheter. Using biplane roadmap technique, initially an 014 inch standard Synchro micro guidewire was advanced inside of an 021 Trevo ProVue microcatheter.  Multiple attempts were made using a torque device to advance and access the occluded left internal carotid artery proximally without success.  The wire was then replaced with an 016 Fathom micro guidewire.  Again with the microcatheter tip right against the wall of the occluded left internal carotid artery at the bulb, and with the balloon guide advanced to abut the occluded left internal carotid  artery at the bulb, multiple attempts were made to access the occluded left internal carotid artery without success.  This system was then replaced with a 5 French 125 cm slip cath. This was then advanced over a 0.0035 Roadrunner guidewire and directed against the wall of the left internal carotid artery at the bulb.  Multiple attempts were then made to access the occluded left internal carotid artery initially with an 035 inch Roadrunner wire, and a glide guidewire without success.  Finally attempts were made to advance the curved end of the slip cath into the occluded left internal carotid artery at the bulb again without success. The balloon guide was then removed.  Over a 0.035 inch Roadrunner guidewire, a JB 1 5 French diagnostic catheter was then advanced and positioned in the occluded left subclavian artery.  Wire manipulation and catheter manipulation combination was then performed using a 0.035 inch Roadrunner guidewire, a glide wire, and finally an 016 Fathom micro guidewire without success.  The diagnostic catheter was then retrieved and removed.  The 8 French Brite tip 23 cm sheath was then removed with the successful  application of a 7 [place] ExoSeal closure device.  The right groin appeared soft. Distal pulses remained Dopplerable in the dorsalis pedis, and the posterior tibial regions bilaterally unchanged.  A flat panel CT of the brain revealed no evidence of mass effect, midline shift or of intracranial hemorrhage.  The patient's general anesthesia was then reversed and the patient was then extubated without difficulty. Upon recovery, the patient was able to moved all fours equally, and also responded appropriately.  The patient was then returned to neuro ICU in stable condition for further management.  IMPRESSION: Status post unsuccessful attempt at recanalization of the left internal carotid artery proximally, and also of the left subclavian artery at its proximal occlusion.  PLAN:  Follow-up in the clinic 4 weeks post discharge.   Electronically Signed   By: Luanne Bras M.D.   On: 12/15/2019 12:10   IR Angiogram Extremity Left  Result Date: 12/15/2019 Study Result CLINICAL DATA:  New onset of right-sided arm and leg numbness associated with episodes of intermittent dysarthria and difficulty finding words, right upper extremity weakness, and dizziness.  EXAM: IR ANGIO EXTERNAL CAROTID SEL EXT CAROTID BILAT MOD SED  COMPARISON:  CT angiogram of the head and neck.  MEDICATIONS: Heparin 0 units IV; no antibiotic was administered within 1 hour of the procedure.  ANESTHESIA/SEDATION: Versed 1 mg IV; Fentanyl 100 mcg IV  Moderate Sedation Time:  36 minutes  The patient was continuously monitored during the procedure by the interventional radiology nurse under my direct supervision.  CONTRAST:  Isovue 300 approximately 60 mL.  FLUOROSCOPY TIME:  Fluoroscopy Time: 9 minutes 36 seconds (1894 mGy).  COMPLICATIONS: None immediate.  TECHNIQUE: Informed written consent was obtained from the patient after a thorough discussion of the procedural risks, benefits and alternatives. All questions were addressed. Maximal Sterile Barrier Technique was utilized including caps, mask, sterile gowns, sterile gloves, sterile drape, hand hygiene and skin antiseptic. A timeout was performed prior to the initiation of the procedure.  The right groin was prepped and draped in the usual sterile fashion. Thereafter using modified Seldinger technique, transfemoral access into the right common femoral artery was obtained without difficulty. Over a 0.035 inch guidewire, a 5 French Pinnacle sheath was inserted. Through this, and also over 0.035 inch guidewire, a 5 [place] JB 1 catheter was advanced to the aortic arch region and selectively positioned in the right common carotid artery, the right vertebral artery, the left common carotid artery and the left subclavian artery.  FINDINGS: The innominate  artery angiogram demonstrates the origins of the right subclavian artery and the right common carotid artery to be widely patent.  The right common carotid arteriogram demonstrates mild atherosclerotic related narrowing at the origin of the right external carotid artery. Its branches opacify normally.  The right internal carotid artery at the bulb demonstrates mild stenosis with a partially calcified plaque without evidence of intraluminal filling defects or of ulcerations.  At the level of C2 there is a fine linear band seen best on the lateral projection felt to be extraluminal.  Mild tortuosity is seen of the mid right ICA segment. More distally, the right internal carotid artery is seen to opacify to the cranial skull base. The petrous, cavernous and supraclinoid segments are widely patent.  The right middle cerebral artery and the right anterior cerebral artery opacify into the capillary and venous phases.  Prompt opacification via the anterior communicating artery of the left anterior cerebral A2 segment, the left anterior cerebral A1 hypoplastic segment,  and the left middle cerebral artery is seen with mixing of unopacified blood from the left supraclinoid ICA.  The origin of the right vertebral artery is widely patent.  The vessel is seen to opacify to the cranial skull base. Wide patency is seen of the right vertebrobasilar junction and the right posterior-inferior cerebellar artery.  The basilar artery, the posterior cerebral arteries, the superior cerebellar arteries and the anterior-inferior cerebellar arteries demonstrate wide patency into the capillary and venous phases. However, prompt retrograde opacification via the left posterior communicating artery of the left middle cerebral artery territory is seen. Also demonstrated is retrograde opacification of the left vertebrobasilar junction opacifying the left posteroinferior cerebellar artery and retrogradely opacifying the left vertebral  artery to the level of the left subclavian artery, consistent with left subclavian steal.  The left common carotid arteriogram demonstrates the left external carotid artery origin and its branches to be widely patent.  The left internal carotid artery at the bulb demonstrates complete angiographic occlusion without evidence of a delayed string sign angiographically.  More distally, there is reconstitution of the caval cavernous segment of the left internal carotid artery from the left external carotid artery branches via the ophthalmic artery.  There is mild narrowing of the supraclinoid left ICA.  Prompt appearance of the left middle cerebral artery proximally and of the superior and inferior divisions is seen with mixing of unopacified blood from the right internal carotid artery injection via the anterior communicating artery. Mild stenosis of the superior division of the left middle cerebral artery is noted.  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  Partial retrograde opacification of the left middle cerebral artery and the left anterior cerebral artery via the anterior communicating artery from the right internal carotid artery injection.  Collateral support to the left anterior and the left middle cerebral artery distribution from the vertebral artery from the right vertebral artery via the left posterior communicating artery.  Prompt retrograde opacification of the left vertebrobasilar junction to the left subclavian artery consistent with subclavian steal.  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   PLAN: Follow-up in the clinic 4 weeks post discharge.   Electronically Signed   By: Luanne Bras M.D.   On: 12/15/2019 11:41    IR CT Head Ltd  Result Date: 12/15/2019 INDICATION: Worsening neurological symptoms with dysarthria, right-sided numbness and weakness with difficulty with expression.  EXAM: 1. EMERGENT LARGE VESSEL OCCLUSION THROMBOLYSIS anterior CIRCULATION)  COMPARISON:  Diagnostic catheter arteriogram of December 14, 2019.  MEDICATIONS: Ancef 2 g IV antibiotic was administered within 1 hour of the procedure.  ANESTHESIA/SEDATION: General anesthesia.  CONTRAST:  Isovue 300 approximately 60 mL.  FLUOROSCOPY TIME:  Fluoroscopy Time: 52 minutes 30 seconds (2331 mGy).  COMPLICATIONS: None immediate.  TECHNIQUE: Following a full explanation of the procedure along with the potential associated complications, an informed witnessed consent was obtained patient and spouse. The risks of intracranial hemorrhage of 10%, worsening neurological deficit, ventilator dependency, death and inability to revascularize were all reviewed in detail with the patient's spouse.  The patient was then put under general anesthesia by the Department of Anesthesiology at [facility].  The right groin was prepped  and draped in the usual sterile fashion. Thereafter using modified Seldinger technique, transfemoral access into the right common femoral artery was obtained without difficulty. Over a 0.035 inch guidewire a 8 French 23 cm Brite tip sheath was inserted. Through this, and also over a 0.035 inch guidewire a 5 [place] JB 1 catheter was advanced to the aortic arch region and selectively positioned in the left common carotid artery and the left subclavian artery.  FINDINGS: The left common carotid arteriogram continues to demonstrate angiographically occluded left internal carotid artery at the bulb without evidence of a delayed string sign.  Reconstitution of the distal left internal carotid artery  and the cavernous and supraclinoid segment is again demonstrated from the left external carotid artery collaterals via the ophthalmic artery.  PROCEDURE: The diagnostic JB 1 catheter in the left internal carotid artery was exchanged over a 0.035 inch 300 cm Rosen exchange guidewire for a 95 cm 0.087 balloon guide catheter which had been prepped with 50% contrast and 50% heparinized saline infusion.  The balloon guide was then advanced just proximal to the left common carotid artery bifurcation. The guidewire was removed. Good aspiration was obtained from the hub of the balloon guide catheter. Using biplane roadmap technique, initially an 014 inch standard Synchro micro guidewire was advanced inside of an 021 Trevo ProVue microcatheter.  Multiple attempts were made using a torque device to advance and access the occluded left internal carotid artery proximally without success.  The wire was then replaced with an 016 Fathom micro guidewire.  Again with the microcatheter tip right against the wall of the occluded left internal carotid artery at the bulb, and with the balloon guide advanced to abut the occluded left internal carotid artery at the bulb, multiple attempts were made to access the occluded left internal carotid artery without success.  This system was then replaced with a 5 French 125 cm slip cath. This was then advanced over a 0.0035 Roadrunner guidewire and directed against the wall of the left internal carotid artery at the bulb.  Multiple attempts were then made to access the occluded left internal carotid artery initially with an 035 inch Roadrunner wire, and a glide guidewire without success.  Finally attempts were made to advance the curved end of the slip cath into the occluded left internal carotid artery at the bulb again without success. The balloon guide was then removed.  Over a 0.035 inch Roadrunner guidewire, a JB 1 5 French diagnostic catheter was then advanced and positioned in the  occluded left subclavian artery.  Wire manipulation and catheter manipulation combination was then performed using a 0.035 inch Roadrunner guidewire, a glide wire, and finally an 016 Fathom micro guidewire without success.  The diagnostic catheter was then retrieved and removed.  The 8 French Brite tip 23 cm sheath was then removed with the successful application of a 7 [place] ExoSeal closure device.  The right groin appeared soft. Distal pulses remained Dopplerable in the dorsalis pedis, and the posterior tibial regions bilaterally unchanged.  A flat panel CT of the brain revealed no evidence of mass effect, midline shift or of intracranial hemorrhage.  The patient's general anesthesia was then reversed and the patient was then extubated without difficulty. Upon recovery, the patient was able to moved all fours equally, and also responded appropriately.  The patient was then returned to neuro ICU in stable condition for further management.  IMPRESSION: Status post unsuccessful attempt at recanalization of the left internal carotid artery proximally, and also  of the left subclavian artery at its proximal occlusion.  PLAN: Follow-up in the clinic 4 weeks post discharge.   Electronically Signed   By: Luanne Bras M.D.   On: 12/15/2019 12:10   CT CEREBRAL PERFUSION W CONTRAST  Result Date: 12/15/2019 CLINICAL DATA:  Slurred speech.  Right-sided facial droop EXAM: CT ANGIOGRAPHY HEAD AND NECK CT PERFUSION BRAIN TECHNIQUE: Multidetector CT imaging of the head and neck was performed using the standard protocol during bolus administration of intravenous contrast. Multiplanar CT image reconstructions and MIPs were obtained to evaluate the vascular anatomy. Carotid stenosis measurements (when applicable) are obtained utilizing NASCET criteria, using the distal internal carotid diameter as the denominator. Multiphase CT imaging of the brain was performed following IV bolus contrast injection. Subsequent  parametric perfusion maps were calculated using RAPID software. CONTRAST:  122mL OMNIPAQUE IOHEXOL 350 MG/ML SOLN COMPARISON:  Head CT earlier same day. FINDINGS: CTA NECK FINDINGS Aortic arch: Aortic atherosclerosis. Right carotid system: Common carotid artery widely patent to the bifurcation region. Soft and calcified plaque at the carotid bifurcation and ICA bulb. No measurable stenosis however. Cervical ICA widely patent beyond bulb. Left carotid system: Common carotid artery shows intimal thickening but is widely patent to the bifurcation. There is occlusion of the ICA at the bifurcation. No reconstituted flow in the cervical region. ECA widely patent. Vertebral arteries: Right subclavian artery is widely patent. Right vertebral artery is widely patent through the cervical region. Left subclavian artery is occluded just beyond its origin, with reconstituted flow likely due to retrograde flow in the left vertebral artery. Skeleton: Negative Other neck: No mass or lymphadenopathy. Upper chest: Upper lungs are clear. Review of the MIP images confirms the above findings CTA HEAD FINDINGS Anterior circulation: Right internal carotid artery is patent through the skull base and siphon region. No antegrade flow in the left ICA at the skull base level. There is reconstituted flow in the siphon, probably with contributions from external to internal collaterals, patent anterior communicating artery and patent posterior communicating artery at least on the left. Right anterior and middle cerebral vessels appear widely patent. Left anterior and middle cerebral vessels show flow. No large or medium vessel occlusion is identified. Posterior circulation: Both vertebral arteries show flow at the foramen magnum with presumed retrograde flow on the left. Basilar artery appears normal. Superior cerebellar and posterior cerebral arteries appear normal. Venous sinuses: Patent and normal. Anatomic variants: None other significant.  Review of the MIP images confirms the above findings CT Brain Perfusion Findings: ASPECTS: 9 CBF (<30%) Volume: 3mL Perfusion (Tmax>6.0s) volume: 245mL Mismatch Volume: 243mL Infarction Location:Left parietal vertex as shown by CT IMPRESSION: Occlusion of the left internal carotid artery at the bifurcation. Reconstituted flow in the left carotid siphon likely due to external to internal collaterals, patent anterior communicating artery and patent posterior communicating artery on the left. No large or medium vessel intracranial occlusion is identified. 6 cc region of completed infarction at the left parietal vertex. 208 cc region of T-max greater than 6 seconds throughout the left MCA territory due to the reconstituted flow described above. 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 These results were called by telephone at the time of interpretation on 12/15/2019 at 12:04 am to provider Veryl Speak , who verbally acknowledged these results. Electronically Signed   By: Nelson Chimes M.D.   On: 12/15/2019 00:08   DG CHEST PORT  1 VIEW  Result Date: 12/15/2019 CLINICAL DATA:  Stroke, hypertension, MI EXAM: PORTABLE CHEST 1 VIEW COMPARISON:  Radiograph 09/12/2018 FINDINGS: Lung volumes are low with hazy interstitial opacities and cephalized, indistinct pulmonary vascularity. Cardiac silhouette is markedly enlarged from comparison exams. No visible pneumothorax or effusion. No focal consolidative opacity. No acute osseous or soft tissue abnormality. Telemetry leads overlie the chest. IMPRESSION: 1. Markedly enlarged cardiac silhouette from comparison exams. Could reflect cardiomegaly or pericardial effusion. 2. Additional features of likely vascular congestion and interstitial edema on a background of low volumes and atelectasis. Electronically Signed   By: Lovena Le M.D.    On: 12/15/2019 06:26   ECHOCARDIOGRAM COMPLETE  Result Date: 12/15/2019    ECHOCARDIOGRAM REPORT   Patient Name:   Brandon [name] Date of Exam: 12/15/2019 Medical Rec #:  [id number]       Height:       69.0 in Accession #:    [id number]      Weight:       289.9 lb Date of Birth:  1971-07-10       BSA:          2.419 m Patient Age:    38 years        BP:           128/84 mmHg Patient Gender: M               HR:           62 bpm. Exam Location:  Inpatient Procedure: 2D Echo, Cardiac Doppler and Color Doppler Indications:    Stroke 434.91  History:        Patient has prior history of Echocardiogram examinations, most                 recent 01/22/2018. Stroke; Risk Factors:Current Smoker.  Sonographer:    Vickie Epley RDCS Referring Phys: [place]  1. Left ventricular ejection fraction, by estimation, is 55 to 60%. The left ventricle has normal function. Left ventricular endocardial border not optimally defined to evaluate regional wall motion. The left ventricular internal cavity size was mildly dilated. Left ventricular diastolic parameters are indeterminate. Elevated left ventricular end-diastolic pressure.  2. Right ventricular systolic function is normal. The right ventricular size is normal. Tricuspid regurgitation signal is inadequate for assessing PA pressure.  3. The mitral valve is normal in structure and function. No evidence of mitral valve regurgitation. No evidence of mitral stenosis.  4. The aortic valve is tricuspid. Aortic valve regurgitation is trivial. No aortic stenosis is present.  5. The inferior vena cava is normal in size with greater than 50% respiratory variability, suggesting right atrial pressure of 3 mmHg. FINDINGS  Left Ventricle: Left ventricular ejection fraction, by estimation, is 55 to 60%. The left ventricle has normal function. Left ventricular endocardial border not optimally defined to evaluate regional wall motion. The left ventricular internal cavity size was  mildly dilated. There is no left ventricular hypertrophy. Left ventricular diastolic parameters are indeterminate. Elevated left ventricular end-diastolic pressure. Right Ventricle: The right ventricular size is normal. No increase in right ventricular wall thickness. Right ventricular systolic function is normal. Tricuspid regurgitation signal is inadequate for assessing PA pressure. Left Atrium: Left atrial size was normal in size. Right Atrium: Right atrial size was normal in size. Pericardium: There is no evidence of pericardial effusion. Mitral Valve: The mitral valve is normal in structure and function. Normal mobility of the mitral valve leaflets. No evidence of  mitral valve regurgitation. No evidence of mitral valve stenosis. Tricuspid Valve: The tricuspid valve is normal in structure. Tricuspid valve regurgitation is not demonstrated. No evidence of tricuspid stenosis. Aortic Valve: The aortic valve is tricuspid. Aortic valve regurgitation is trivial. No aortic stenosis is present. Pulmonic Valve: The pulmonic valve was normal in structure. Pulmonic valve regurgitation is not visualized. No evidence of pulmonic stenosis. Aorta: The aortic root is normal in size and structure. Venous: The inferior vena cava is normal in size with greater than 50% respiratory variability, suggesting right atrial pressure of 3 mmHg. IAS/Shunts: No atrial level shunt detected by color flow Doppler.  LEFT VENTRICLE PLAX 2D LVIDd:         6.10 cm      Diastology LVIDs:         4.80 cm      LV e' lateral:   6.73 cm/s LV PW:         0.80 cm      LV E/e' lateral: 14.5 LV IVS:        0.80 cm      LV e' medial:    5.51 cm/s LVOT diam:     2.10 cm      LV E/e' medial:  17.7 LV SV:         61.31 ml LV SV Index:   25.34 LVOT Area:     3.46 cm  LV Volumes (MOD) LV vol d, MOD A2C: 151.0 ml LV vol d, MOD A4C: 173.0 ml LV vol s, MOD A2C: 88.8 ml LV vol s, MOD A4C: 89.5 ml LV SV MOD A2C:     62.2 ml LV SV MOD A4C:     173.0 ml LV SV MOD BP:       70.8 ml RIGHT VENTRICLE RV S prime:     10.90 cm/s TAPSE (M-mode): 1.8 cm LEFT ATRIUM             Index LA diam:        4.10 cm 1.69 cm/m LA Vol (A2C):   40.8 ml 16.87 ml/m LA Vol (A4C):   43.3 ml 17.90 ml/m LA Biplane Vol: 41.9 ml 17.32 ml/m  AORTIC VALVE LVOT Vmax:   85.00 cm/s LVOT Vmean:  55.100 cm/s LVOT VTI:    0.177 m  AORTA Ao Root diam: 3.20 cm MITRAL VALVE MV Area (PHT): 3.65 cm    SHUNTS MV Decel Time: 208 msec    Systemic VTI:  0.18 m MV E velocity: 97.50 cm/s  Systemic Diam: 2.10 cm MV A velocity: 67.90 cm/s MV E/A ratio:  1.44 Fransico Him MD Electronically signed by Fransico Him MD Signature Date/Time: 12/15/2019/10:37:20 AM    Final    IR PERCUTANEOUS ART THROMBECTOMY/INFUSION INTRACRANIAL INC DIAG ANGIO  Result Date: 12/16/2019 INDICATION: Worsening neurological symptoms with dysarthria, right-sided numbness and weakness with difficulty with expression. EXAM: 1. EMERGENT LARGE VESSEL OCCLUSION THROMBOLYSIS anterior CIRCULATION) COMPARISON:  Diagnostic catheter arteriogram of December 14, 2019. MEDICATIONS: Ancef 2 g IV antibiotic was administered within 1 hour of the procedure. ANESTHESIA/SEDATION: General anesthesia. CONTRAST:  Isovue 300 approximately 60 mL. FLUOROSCOPY TIME:  Fluoroscopy Time: 52 minutes 30 seconds (2331 mGy). COMPLICATIONS: None immediate. TECHNIQUE: Following a full explanation of the procedure along with the potential associated complications, an informed witnessed consent was obtained patient and spouse. The risks of intracranial hemorrhage of 10%, worsening neurological deficit, ventilator dependency, death and inability to revascularize were all reviewed in detail with the patient's spouse. The patient was  then put under general anesthesia by the Department of Anesthesiology at [facility]. The right groin was prepped and draped in the usual sterile fashion. Thereafter using modified Seldinger technique, transfemoral access into the right common femoral  artery was obtained without difficulty. Over a 0.035 inch guidewire a 8 French 23 cm Brite tip sheath was inserted. Through this, and also over a 0.035 inch guidewire a 5 [place] JB 1 catheter was advanced to the aortic arch region and selectively positioned in the left common carotid artery and the left subclavian artery. FINDINGS: The left common carotid arteriogram continues to demonstrate angiographically occluded left internal carotid artery at the bulb without evidence of a delayed string sign. Reconstitution of the distal left internal carotid artery and the cavernous and supraclinoid segment is again demonstrated from the left external carotid artery collaterals via the ophthalmic artery. PROCEDURE: The diagnostic JB 1 catheter in the left internal carotid artery was exchanged over a 0.035 inch 300 cm Rosen exchange guidewire for a 95 cm 0.087 balloon guide catheter which had been prepped with 50% contrast and 50% heparinized saline infusion. The balloon guide was then advanced just proximal to the left common carotid artery bifurcation. The guidewire was removed. Good aspiration was obtained from the hub of the balloon guide catheter. Using biplane roadmap technique, initially an 014 inch standard Synchro micro guidewire was advanced inside of an 021 Trevo ProVue microcatheter. Multiple attempts were made using a torque device to advance and access the occluded left internal carotid artery proximally without success. The wire was then replaced with an 016 Fathom micro guidewire. Again with the microcatheter tip right against the wall of the occluded left internal carotid artery at the bulb, and with the balloon guide advanced to abut the occluded left internal carotid artery at the bulb, multiple attempts were made to access the occluded left internal carotid artery without success. This system was then replaced with a 5 French 125 cm slip cath. This was then advanced over a 0.0035 Roadrunner guidewire and  directed against the wall of the left internal carotid artery at the bulb. Multiple attempts were then made to access the occluded left internal carotid artery initially with an 035 inch Roadrunner wire, and a glide guidewire without success. Finally attempts were made to advance the curved end of the slip cath into the occluded left internal carotid artery at the bulb again without success. The balloon guide was then removed. Over a 0.035 inch Roadrunner guidewire, a JB 1 5 French diagnostic catheter was then advanced and positioned in the occluded left subclavian artery. Wire manipulation and catheter manipulation combination was then performed using a 0.035 inch Roadrunner guidewire, a glide wire, and finally an 016 Fathom micro guidewire without success. The diagnostic catheter was then retrieved and removed. The 8 French Brite tip 23 cm sheath was then removed with the successful application of a 7 [place] ExoSeal closure device. The right groin appeared soft. Distal pulses remained Dopplerable in the dorsalis pedis, and the posterior tibial regions bilaterally unchanged. A flat panel CT of the brain revealed no evidence of mass effect, midline shift or of intracranial hemorrhage. The patient's general anesthesia was then reversed and the patient was then extubated without difficulty. Upon recovery, the patient was able to moved all fours equally, and also responded appropriately. The patient was then returned to neuro ICU in stable condition for further management. IMPRESSION: Status post unsuccessful attempt at recanalization of the left internal carotid artery proximally, and also  of the left subclavian artery at its proximal occlusion. PLAN: Follow-up in the clinic 4 weeks post discharge. Electronically Signed   By: Luanne Bras M.D.   On: 12/15/2019 12:10   CT HEAD CODE STROKE WO CONTRAST  Result Date: 12/14/2019 CLINICAL DATA:  Code stroke. Slurred speech and right-sided facial droop EXAM: CT  HEAD WITHOUT CONTRAST TECHNIQUE: Contiguous axial images were obtained from the base of the skull through the vertex without intravenous contrast. COMPARISON:  None. FINDINGS: Brain: No acute hemorrhage. There is an area hypoattenuation extending to the cortex of the superior left parietal lobe. There is no midline shift or other mass effect. No hydrocephalus. Vascular: Mild bilateral carotid atherosclerosis. No hyperdense vessel. Skull: Normal. Negative for fracture or focal lesion. Sinuses/Orbits: No acute finding. Other: None. ASPECTS [facility] Stroke Program Early CT Score) - Ganglionic level infarction (caudate, lentiform nuclei, internal capsule, insula, M1-M3 cortex): 7 - Supraganglionic infarction (M4-M6 cortex): 2 Total score (0-10 with 10 being normal): 9 IMPRESSION: 1. No intracranial hemorrhage. 2. 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 3. ASPECTS is 9. These results were called by telephone at the time of interpretation on 12/14/2019 at 11:21 pm to provider Veryl Speak , who verbally acknowledged these results. Electronically Signed   By: Ulyses Jarred M.D.   On: 12/14/2019 23:22   VAS US CAROTID  Result Date: 12/12/2019 Carotid Arterial Duplex Study Indications:   Bilateral bruits and Carotid artery disease. Risk Factors:  Hypertension, hyperlipidemia, current smoker, prior MI, coronary                artery disease. Other Factors: Patient denies any cerebrovascular symptoms. Limitations    Today's exam was limited due to the high bifurcation of the                carotid, the body habitus of the patient and the patient's                respiratory variation. Performing Technologist: Wilkie Aye RVT  Examination Guidelines: A complete evaluation includes B-mode imaging, spectral Doppler, color Doppler, and power Doppler as needed of all accessible portions of each vessel. Bilateral testing is considered an integral part of a complete examination. Limited examinations for  reoccurring indications may be performed as noted.  Right Carotid Findings: +----------+-------+-------+--------+------------------------+-----------------+           PSV    EDV    StenosisPlaque Description      Comments                    cm/s   cm/s                                                     +----------+-------+-------+--------+------------------------+-----------------+ CCA Prox  63     14                                                       +----------+-------+-------+--------+------------------------+-----------------+ CCA Distal41     12  intimal                                                                   thickening        +----------+-------+-------+--------+------------------------+-----------------+ ICA Prox  47     17             heterogenous and                                                          irregular                                 +----------+-------+-------+--------+------------------------+-----------------+ ICA Mid   67     30                                                       +----------+-------+-------+--------+------------------------+-----------------+ ICA Distal98     36                                                       +----------+-------+-------+--------+------------------------+-----------------+ ECA       165    36             heterogenous                              +----------+-------+-------+--------+------------------------+-----------------+ +----------+--------+-------+----------------+-------------------+           PSV cm/sEDV cmsDescribe        Arm Pressure (mmHG) +----------+--------+-------+----------------+-------------------+ [id number]            Multiphasic, [id number]                 +----------+--------+-------+----------------+-------------------+ +---------+--------+--+--------+--+---------+ VertebralPSV cm/s82EDV  cm/s21Antegrade +---------+--------+--+--------+--+---------+  Left Carotid Findings: +----------+--------+--------+--------+------------------+--------+           PSV cm/sEDV cm/sStenosisPlaque DescriptionComments +----------+--------+--------+--------+------------------+--------+ CCA Prox  86      16                                         +----------+--------+--------+--------+------------------+--------+ CCA Distal44      13                                         +----------+--------+--------+--------+------------------+--------+ ICA Prox                  Occludedheterogenous               +----------+--------+--------+--------+------------------+--------+ ICA Mid  Occludedheterogenous               +----------+--------+--------+--------+------------------+--------+ ICA Distal                Occludedheterogenous               +----------+--------+--------+--------+------------------+--------+ ECA       150     23                                         +----------+--------+--------+--------+------------------+--------+ +----------+--------+--------+----------+-------------------+           PSV cm/sEDV cm/sDescribe  Arm Pressure (mmHG) +----------+--------+--------+----------+-------------------+ [id number]              monophasic                    +----------+--------+--------+----------+-------------------+ +---------+--------+--------+----------+ VertebralPSV cm/sEDV cm/sRetrograde +---------+--------+--------+----------+ Monophasic flow in the left brachial artery and subclavian artery. Unable to obtain blood pressure.  Summary: Right Carotid: Velocities in the right ICA are consistent with a 1-39% stenosis.                Non-hemodynamically significant plaque <50% noted in the CCA. Left Carotid: The internal carotid artery appears to be occluded. Vertebrals:  Right vertebral artery demonstrates antegrade flow. Left  vertebral              artery demonstrates retrograde flow. Subclavians: Normal flow hemodynamics were seen in the right subclavian artery.              Monophasic flow in the left subclavian artery. *See table(s) above for measurements and observations.  Vascular consult recommended. Electronically signed by Carlyle Dolly MD on 12/12/2019 at 11:10:00 AM.    Final    IR ANGIO VERTEBRAL SEL VERTEBRAL UNI R MOD SED  Result Date: 12/15/2019 Study Result CLINICAL DATA:  New onset of right-sided arm and leg numbness associated with episodes of intermittent dysarthria and difficulty finding words, right upper extremity weakness, and dizziness.  EXAM: IR ANGIO EXTERNAL CAROTID SEL EXT CAROTID BILAT MOD SED  COMPARISON:  CT angiogram of the head and neck.  MEDICATIONS: Heparin 0 units IV; no antibiotic was administered within 1 hour of the procedure.  ANESTHESIA/SEDATION: Versed 1 mg IV; Fentanyl 100 mcg IV  Moderate Sedation Time:  36 minutes  The patient was continuously monitored during the procedure by the interventional radiology nurse under my direct supervision.  CONTRAST:  Isovue 300 approximately 60 mL.  FLUOROSCOPY TIME:  Fluoroscopy Time: 9 minutes 36 seconds (1894 mGy).  COMPLICATIONS: None immediate.  TECHNIQUE: Informed written consent was obtained from the patient after a thorough discussion of the procedural risks, benefits and alternatives. All questions were addressed. Maximal Sterile Barrier Technique was utilized including caps, mask, sterile gowns, sterile gloves, sterile drape, hand hygiene and skin antiseptic. A timeout was performed prior to the initiation of the procedure.  The right groin was prepped and draped in the usual sterile fashion. Thereafter using modified Seldinger technique, transfemoral access into the right common femoral artery was obtained without difficulty. Over a 0.035 inch guidewire, a 5 French Pinnacle sheath was inserted. Through this, and also over 0.035 inch  guidewire, a 5 [place] JB 1 catheter was advanced to the aortic arch region and selectively positioned in the right common carotid artery, the right vertebral artery, the left common carotid artery and the left subclavian artery.  FINDINGS: The innominate artery  angiogram demonstrates the origins of the right subclavian artery and the right common carotid artery to be widely patent.  The right common carotid arteriogram demonstrates mild atherosclerotic related narrowing at the origin of the right external carotid artery. Its branches opacify normally.  The right internal carotid artery at the bulb demonstrates mild stenosis with a partially calcified plaque without evidence of intraluminal filling defects or of ulcerations.  At the level of C2 there is a fine linear band seen best on the lateral projection felt to be extraluminal.  Mild tortuosity is seen of the mid right ICA segment. More distally, the right internal carotid artery is seen to opacify to the cranial skull base. The petrous, cavernous and supraclinoid segments are widely patent.  The right middle cerebral artery and the right anterior cerebral artery opacify into the capillary and venous phases.  Prompt opacification via the anterior communicating artery of the left anterior cerebral A2 segment, the left anterior cerebral A1 hypoplastic segment, and the left middle cerebral artery is seen with mixing of unopacified blood from the left supraclinoid ICA.  The origin of the right vertebral artery is widely patent.  The vessel is seen to opacify to the cranial skull base. Wide patency is seen of the right vertebrobasilar junction and the right posterior-inferior cerebellar artery.  The basilar artery, the posterior cerebral arteries, the superior cerebellar arteries and the anterior-inferior cerebellar arteries demonstrate wide patency into the capillary and venous phases. However, prompt retrograde opacification via the left posterior  communicating artery of the left middle cerebral artery territory is seen. Also demonstrated is retrograde opacification of the left vertebrobasilar junction opacifying the left posteroinferior cerebellar artery and retrogradely opacifying the left vertebral artery to the level of the left subclavian artery, consistent with left subclavian steal.  The left common carotid arteriogram demonstrates the left external carotid artery origin and its branches to be widely patent.  The left internal carotid artery at the bulb demonstrates complete angiographic occlusion without evidence of a delayed string sign angiographically.  More distally, there is reconstitution of the caval cavernous segment of the left internal carotid artery from the left external carotid artery branches via the ophthalmic artery.  There is mild narrowing of the supraclinoid left ICA.  Prompt appearance of the left middle cerebral artery proximally and of the superior and inferior divisions is seen with mixing of unopacified blood from the right internal carotid artery injection via the anterior communicating artery. Mild stenosis of the superior division of the left middle cerebral artery is noted.  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  Partial retrograde opacification of the left middle cerebral artery and the left anterior cerebral artery via the anterior communicating artery from the right internal carotid artery injection.  Collateral support to the left anterior and the left middle cerebral artery distribution from the vertebral artery from the right vertebral artery via  the left posterior communicating artery.  Prompt retrograde opacification of the left vertebrobasilar junction to the left subclavian artery consistent with subclavian steal.  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  PLAN: Follow-up in the clinic 4 weeks post discharge.   Electronically Signed   By: Luanne Bras  M.D.   On: 12/15/2019 11:41    IR ANGIO EXTERNAL CAROTID SEL EXT CAROTID BILAT MOD SED  Result Date: 12/16/2019 CLINICAL DATA:  New onset of right-sided arm and leg numbness associated with episodes of intermittent dysarthria and difficulty finding words, right upper extremity weakness, and dizziness. EXAM: IR ANGIO EXTERNAL CAROTID SEL EXT CAROTID BILAT MOD SED COMPARISON:  CT angiogram of the head and neck. MEDICATIONS: Heparin 0 units IV; no antibiotic was administered within 1 hour of the procedure. ANESTHESIA/SEDATION: Versed 1 mg IV; Fentanyl 100 mcg IV Moderate Sedation Time:  36 minutes The patient was continuously monitored during the procedure by the interventional radiology nurse under my direct supervision. CONTRAST:  Isovue 300 approximately 60 mL. FLUOROSCOPY TIME:  Fluoroscopy Time: 9 minutes 36 seconds (1894 mGy). COMPLICATIONS: None immediate. TECHNIQUE: Informed written consent was obtained from the patient after a thorough discussion of the procedural risks, benefits and alternatives. All questions were addressed. Maximal Sterile Barrier Technique was utilized including caps, mask, sterile gowns, sterile gloves, sterile drape, hand hygiene and skin antiseptic. A timeout was performed prior to the initiation of the procedure. The right groin was prepped and draped in the usual sterile fashion. Thereafter using modified Seldinger technique, transfemoral access into the right common femoral artery was obtained without difficulty. Over a 0.035 inch guidewire, a 5 French Pinnacle sheath was inserted. Through this, and also over 0.035 inch  guidewire, a 5 [place] JB 1 catheter was advanced to the aortic arch region and selectively positioned in the right common carotid artery, the right vertebral artery, the left common carotid artery and the left subclavian artery. FINDINGS: The innominate artery angiogram demonstrates the origins of the right subclavian artery and the right common carotid artery to be widely patent. The right common carotid arteriogram demonstrates mild atherosclerotic related narrowing at the origin of the right external carotid artery. Its branches opacify normally. The right internal carotid artery at the bulb demonstrates mild stenosis with a partially calcified plaque without evidence of intraluminal filling defects or of ulcerations. At the level of C2 there is a fine linear band seen best on the lateral projection felt to be extraluminal. Mild tortuosity is seen of the mid right ICA segment. More distally, the right internal carotid artery is seen to opacify to the cranial skull base. The petrous, cavernous and supraclinoid segments are widely patent. The right middle cerebral artery and the right anterior cerebral artery opacify into the capillary and venous phases. Prompt opacification via the anterior communicating artery of the left anterior cerebral A2 segment, the left anterior cerebral A1 hypoplastic segment, and the left middle cerebral artery is seen with mixing of unopacified blood from the left supraclinoid ICA. The origin of the right vertebral artery is widely patent. The vessel is seen to opacify to the cranial skull base. Wide patency is seen of the right vertebrobasilar junction and the right posterior-inferior cerebellar artery. The basilar artery, the posterior cerebral arteries, the superior cerebellar arteries and the anterior-inferior cerebellar arteries demonstrate wide patency into the capillary and venous phases. However, prompt retrograde opacification via the left posterior communicating artery of the  left middle cerebral artery territory is seen. Also demonstrated is retrograde opacification of the left vertebrobasilar junction opacifying the left posteroinferior cerebellar artery and retrogradely opacifying the left vertebral artery to the level of the left subclavian artery, consistent with left subclavian steal. The left common carotid arteriogram demonstrates the left external carotid artery origin and its branches to be widely patent. The  left internal carotid artery at the bulb demonstrates complete angiographic occlusion without evidence of a delayed string sign angiographically. More distally, there is reconstitution of the caval cavernous segment of the left internal carotid artery from the left external carotid artery branches via the ophthalmic artery. There is mild narrowing of the supraclinoid left ICA. Prompt appearance of the left middle cerebral artery proximally and of the superior and inferior divisions is seen with mixing of unopacified blood from the right internal carotid artery injection via the anterior communicating artery. Mild stenosis of the superior division of the left middle cerebral artery is noted. 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 Partial retrograde opacification of the left middle cerebral artery and the left anterior cerebral artery via the anterior communicating artery from the right internal carotid artery injection. Collateral support to the left anterior and the left middle cerebral artery distribution from the vertebral artery from the right vertebral artery via the left posterior communicating artery.  Prompt retrograde opacification of the left vertebrobasilar junction to the left subclavian artery consistent with subclavian steal. 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 PLAN: Follow-up in the clinic 4 weeks post discharge. Electronically Signed   By: Luanne Bras M.D.   On: 12/15/2019 11:41    Labs:  CBC: Recent Labs    12/14/19 2321 12/14/19 2321 12/14/19 2340 12/15/19 0357 12/16/19 0530 12/17/19 0511  WBC 11.9*  --   --  10.3 10.0 8.4  HGB 16.0   < > 15.6 16.0 14.2 14.0  HCT 47.4   < > 46.0 46.1 40.8 40.3  PLT 198  --   --  184 153 138*   < > = values in this interval not displayed.    COAGS: Recent Labs    12/14/19 2321  INR 1.0  APTT 29    BMP: Recent Labs    12/14/19 2321 12/14/19 2321 12/14/19 2340 12/15/19 0357 12/16/19 0530 12/17/19 0511  NA 139   < > 139 137 139 140  K 3.5   < > 3.4* 4.0 3.4* 3.6  CL 102   < > 104 102 107 107  CO2 28  --   --  25 21* 22  GLUCOSE 151*   < > 149* 110* 110* 110*  BUN 13   < > 11 10 7 11   CALCIUM 8.9  --   --  8.8* 8.7* 8.9  CREATININE 0.89   < > 0.80 0.94 0.75 0.81  GFRNONAA >60  --   --  >60 >60 >60  GFRAA >60  --   --  >60 >60 >60   < > = values in this interval not displayed.    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 Dr. Estanislado Pandy was present for consultation.  Discussed left ICA occlusion. Patient is  grossly asymptomatic at this time (denies any symptoms since TIA). States that he went to see Dr. Oneida Alar (vascular surgery) at the end of last month who has tentative plans for left carotid subclavian bypass pending recovery from TIA. Plan for follow-up PRN- patient  to maintain follow-up with Dr. Oneida Alar. Instructed patient to call our office with questions/concerns.  Discussed secondary stroke prevention. Instructed patient to continue taking Plavix 75 mg once daily and Aspirin 325 mg once daily. Instructed patient to follow-up with his neurologist regularly (number for [facility] given to patient). Instructed patient to discontinue all tobacco use at this time.  All questions answered and concerns addressed. Patient conveys understanding and agrees with plan.  Thank you for this interesting consult.  I greatly enjoyed meeting Brandon [name] and look forward to participating in their care.  A copy of this report was sent to the requesting provider on this date.  Electronically Signed: Earley Abide, PA-C 12/29/2019, 9:11 AM   I spent a total of 25 Minutes in face to face in clinical consultation, greater than 50% of which was counseling/coordinating care for left ICA occlusion.

## 2019-12-29 NOTE — Progress Notes (Signed)
   Subjective:    Patient ID: Brandon [name], male    DOB: [birth year], 49 y.o.   MRN: [id number]  HPI He is here for follow-up after recent hospitalization and treatment for CVA of the left parietal area.  He states that neurologically he is back to normal except for memory issues.  It also showed evidence of left internal carotid artery occlusion.  He is scheduled for follow-up with interventional cardiology tomorrow.  He is scheduled to follow-up with neurology.  He is ready to return to work.  He is now smoking roughly 5 cigarettes a day and states that stress is causing him to do this.  His medications were reviewed.  While in the hospital he was seen by neurology, cardiovascular, cardiology and interventional cardiology.  Presently he has had no chest pain, shortness of breath, weakness or blurred or double vision.  Review of Systems     Objective:   Physical Exam Alert and in no distress.  Cardiac exam shows regular rhythm without murmurs or gallops.  Lungs clear to auscultation.  Strength grossly is normal.       Assessment & Plan:  Cerebrovascular accident (CVA) due to occlusion of left carotid artery ([place])  Arteriosclerotic cardiovascular disease (ASCVD)  Essential hypertension  Tobacco abuse  Morbid obesity ([place])  Mixed hyperlipidemia He will follow up with neurology, cardiology and interventional cardiology.  Continue on his present medications. I then discussed techniques to help with his smoking.  Recommended going outside, taking deep breaths every time he feels the urge to light up.  I will continue to work with him to get him off all cigarettes.  Presently he is walking around 5/day. I then discussed briefly dietary modification especially in regard to cutting back on carbohydrates.  He does drink a lot of sodas and strongly encouraged him to stop that entirely. Follow-up here in 3 to 4 months.

## 2019-12-30 LAB — LIPOPROTEIN A (LPA): Lipoprotein (a): 284.4 nmol/L — ABNORMAL HIGH (ref <75.0)

## 2019-12-30 NOTE — Telephone Encounter (Signed)
Add zetia 10 mg daily - repeat lipids in 3 months - consider re-application PA at that time.  Dr Lemmie Evens

## 2019-12-30 NOTE — Telephone Encounter (Signed)
MyChart message sent to patient explaining rationale for zetia daily. Will await reply and then send Rx to preferred pharmacy

## 2019-12-31 NOTE — Telephone Encounter (Signed)
Wife returned call stating that her daughter found a coupon online, that the doctor signs and patient can get medication for free.

## 2020-01-01 NOTE — Telephone Encounter (Signed)
MyChart message sent to patient concerning this coupon - for which med? Asked that coupon be emailed, sent via [place], or faxed

## 2020-01-02 ENCOUNTER — Telehealth: Payer: Self-pay | Admitting: Internal Medicine

## 2020-01-02 MED ORDER — EZETIMIBE 10 MG PO TABS: 10.0000 mg | ORAL_TABLET | Freq: Every day | ORAL | 3 refills | Status: DC

## 2020-01-02 NOTE — Telephone Encounter (Signed)
  Pt's wife called, She wanted to speak with [place], she said that her daughter found an application online from the company [employer] to get it for free. They said they are willing to fill up the form and wanted to ask Dr. Stanford Breed if he still want for the pt to take it.  Please call

## 2020-01-02 NOTE — Telephone Encounter (Signed)
Spoke with wife about application for free Repatha. She is referring to [employer] application. Explained that this is for those with NO insurance or those with medicare who cannot afford medication, not for those whose insurance has DENIED coverage of the medication. Explained that his insurance is requiring him try zetia 10mg  daily and MD agreed w/this. Rx was sent to pharmacy and wife is aware patient will need to take this, in addition to current medications, and recheck labs prior to June 9 visit with Dr. Debara Pickett. If not at goal LDL of less than 70 at this time, will resubmit for Repatha approval.

## 2020-01-15 ENCOUNTER — Encounter: Payer: Self-pay | Admitting: Vascular Surgery

## 2020-01-15 ENCOUNTER — Other Ambulatory Visit: Payer: Self-pay

## 2020-01-15 ENCOUNTER — Ambulatory Visit (INDEPENDENT_AMBULATORY_CARE_PROVIDER_SITE_OTHER): Payer: 59 | Admitting: Vascular Surgery

## 2020-01-15 VITALS — BP 144/84 | HR 89 | Temp 98.0°F | Resp 20 | Ht 69.0 in | Wt 301.0 lb

## 2020-01-15 DIAGNOSIS — I708 Atherosclerosis of other arteries: Secondary | ICD-10-CM

## 2020-01-15 NOTE — H&P (View-Only) (Signed)
Patient is a 49 year old male who returns for follow-up today.  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  He had reversal of flow in the left vertebral artery.  We are considering left carotid subclavian bypass to improve left brain perfusion.  He is currently on Plavix aspirin and a statin.  He has a history of coronary disease and returns today after recent cardiac evaluation was felt to be low risk for carotid subclavian bypass with no other interventions at this point.  He has not had any neurologic events since his last office visit.  He is currently considering applying for disability as he was let go from his job and does not feel like he will probably be able to be rehired due to his brain dysfunction.  Past Medical History:  Diagnosis Date  . Arteriosclerotic cardiovascular disease (ASCVD) 2006, 2012   2006-acute IMI treated with urgent RCA stent; 2007-Cutting [place]oon for in-stent restenosis; 02/2010-presented with ACS and minimal troponin elevation:70% LAD, 80% distal circumflex, 80% proximal ramus branch vessel,in-stent restenosis of 70% in the RCA; BMS for proximal critical RCA stenosis, restenosis Nov 2012  . Carotid artery occlusion   . Heart attack ([place])   . History of noncompliance with medical treatment    Due to financial considerations  . Hyperlipidemia   . Hypertension   . Stroke ([place])   . Tobacco abuse    40 pack years    Past Surgical History:  Procedure Laterality Date  . CORONARY ANGIOPLASTY WITH STENT PLACEMENT    . IR ANGIO EXTERNAL CAROTID SEL EXT CAROTID BILAT MOD SED  12/15/2019  . IR ANGIO VERTEBRAL SEL VERTEBRAL UNI R MOD SED  12/15/2019  . IR ANGIOGRAM EXTREMITY LEFT  12/15/2019  . IR ANGIOGRAM EXTREMITY LEFT  12/15/2019  . IR CT HEAD LTD  12/15/2019  . IR PERCUTANEOUS ART THROMBECTOMY/INFUSION INTRACRANIAL INC  DIAG ANGIO  12/15/2019  . RADIOLOGY WITH ANESTHESIA N/A 12/15/2019   Procedure: IR WITH ANESTHESIA;  Surgeon: Luanne Bras, MD;  Location: [facility];  Service: Radiology;  Laterality: N/A;    Current Outpatient Medications on File Prior to Visit  Medication Sig Dispense Refill  . aspirin EC 325 MG EC tablet Take 1 tablet (325 mg total) by mouth daily. 90 tablet 0  . atorvastatin (LIPITOR) 80 MG tablet Take 1 tablet (80 mg total) by mouth daily. 90 tablet 3  . clopidogrel (PLAVIX) 75 MG tablet Take 1 tablet (75 mg total) by mouth daily. 30 tablet 2  . ezetimibe (ZETIA) 10 MG tablet Take 1 tablet (10 mg total) by mouth daily. 90 tablet 3   No current facility-administered medications on file prior to visit.    Review of systems: He has no shortness of breath.  He has no chest pain.  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  Risk benefits possible complications of procedure details discussed with the patient today include but not limited to bleeding infection stroke risk 1 to 2% cranial nerve injury risk 5%.  He understands and agrees to proceed.  We will stop his Plavix 7 days prior to procedure.  Ruta Hinds, MD [facility] Office: [phone]

## 2020-01-15 NOTE — Progress Notes (Signed)
Patient is a 49 year old male who returns for follow-up today.  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  He had reversal of flow in the left vertebral artery.  We are considering left carotid subclavian bypass to improve left brain perfusion.  He is currently on Plavix aspirin and a statin.  He has a history of coronary disease and returns today after recent cardiac evaluation was felt to be low risk for carotid subclavian bypass with no other interventions at this point.  He has not had any neurologic events since his last office visit.  He is currently considering applying for disability as he was let go from his job and does not feel like he will probably be able to be rehired due to his brain dysfunction.  Past Medical History:  Diagnosis Date  . Arteriosclerotic cardiovascular disease (ASCVD) 2006, 2012   2006-acute IMI treated with urgent RCA stent; 2007-Cut[place] Balloon for in-stent restenosis; 02/2010-presented with ACS and minimal troponin elevation:70% LAD, 80% distal circumflex, 80% proximal ramus branch vessel,in-stent restenosis of 70% in the RCA; BMS for proximal critical RCA stenosis, restenosis Nov 2012  . Carotid artery occlusion   . Heart attack ([place])   . History of noncompliance with medical treatment    Due to financial considerations  . Hyperlipidemia   . Hypertension   . Stroke ([place])   . Tobacco abuse    40 pack years    Past Surgical History:  Procedure Laterality Date  . CORONARY ANGIOPLASTY WITH STENT PLACEMENT    . IR ANGIO EXTERNAL CAROTID SEL EXT CAROTID BILAT MOD SED  12/15/2019  . IR ANGIO VERTEBRAL SEL VERTEBRAL UNI R MOD SED  12/15/2019  . IR ANGIOGRAM EXTREMITY LEFT  12/15/2019  . IR ANGIOGRAM EXTREMITY LEFT  12/15/2019  . IR CT HEAD LTD  12/15/2019  . IR PERCUTANEOUS ART THROMBECTOMY/INFUSION INTRACRANIAL INC  DIAG ANGIO  12/15/2019  . RADIOLOGY WITH ANESTHESIA N/A 12/15/2019   Procedure: IR WITH ANESTHESIA;  Surgeon: Luanne Bras, MD;  Location: [facility];  Service: Radiology;  Laterality: N/A;    Current Outpatient Medications on File Prior to Visit  Medication Sig Dispense Refill  . aspirin EC 325 MG EC tablet Take 1 tablet (325 mg total) by mouth daily. 90 tablet 0  . atorvastatin (LIPITOR) 80 MG tablet Take 1 tablet (80 mg total) by mouth daily. 90 tablet 3  . clopidogrel (PLAVIX) 75 MG tablet Take 1 tablet (75 mg total) by mouth daily. 30 tablet 2  . ezetimibe (ZETIA) 10 MG tablet Take 1 tablet (10 mg total) by mouth daily. 90 tablet 3   No current facility-administered medications on file prior to visit.    Review of systems: He has no shortness of breath.  He has no chest pain.  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  Risk benefits possible complications of procedure details discussed with the patient today include but not limited to bleeding infection stroke risk 1 to 2% cranial nerve injury risk 5%.  He understands and agrees to proceed.  We will stop his Plavix 7 days prior to procedure.  Ruta Hinds, MD [facility] Office: [phone]

## 2020-01-16 ENCOUNTER — Other Ambulatory Visit: Payer: Self-pay

## 2020-01-22 ENCOUNTER — Ambulatory Visit: Payer: 59 | Admitting: Vascular Surgery

## 2020-01-27 ENCOUNTER — Other Ambulatory Visit: Payer: Self-pay

## 2020-01-27 ENCOUNTER — Encounter: Payer: Self-pay | Admitting: Neurology

## 2020-01-27 ENCOUNTER — Ambulatory Visit (INDEPENDENT_AMBULATORY_CARE_PROVIDER_SITE_OTHER): Payer: 59 | Admitting: Neurology

## 2020-01-27 VITALS — BP 147/80 | HR 83 | Temp 97.4°F | Ht 69.0 in | Wt 303.8 lb

## 2020-01-27 DIAGNOSIS — I708 Atherosclerosis of other arteries: Secondary | ICD-10-CM

## 2020-01-27 DIAGNOSIS — I6522 Occlusion and stenosis of left carotid artery: Secondary | ICD-10-CM

## 2020-01-27 DIAGNOSIS — R413 Other amnesia: Secondary | ICD-10-CM

## 2020-01-27 DIAGNOSIS — I63232 Cerebral infarction due to unspecified occlusion or stenosis of left carotid arteries: Secondary | ICD-10-CM

## 2020-01-27 NOTE — Progress Notes (Signed)
[facility] [address] [place]. [place] [address] [phone]       OFFICE CONSULT NOTE  [name] Date of Birth:  [birth year] Medical Record Number:  [id number]   Referring MD: Burnetta Sabin Reason for Referral: Stroke  HPI: Mr. Brazzel is a 49 year old obese Caucasian male seen today for initial office consultation visit for stroke. History is obtained from the patient, review of electronic medical records and I personally reviewed imaging films in PACS. Mr. Macke is a past medical history of coronary artery disease status post stent, hypertension, hyperlipidemia, obesity and tobacco abuse who presented to [facility], ED on 12/15/2019 with strokelike symptoms which resolved completely but CT scan showed look like subacute left frontal and parietal MCA branch infarcts. He was found to have left carotid occlusion with some distal recanalization to small core but a large penumbra. He was not considered a candidate for TPA due to late presentation. He was transferred [facility] with plans for emergent revascularization but despite attempts by Dr. Estanislado Pandy left carotid could not be opened up. The left subclavian was also found to be occluded. Patient was unable to tolerate an MRI. CT scan showed subacute left parietal and frontal MCA branch infarcts. 2D echo showed normal ejection fraction without cardiac source of embolism. LDL cholesterol 93 mg percent. Hemoglobin A1c was 6.1. Patient was started on dual antiplatelet therapy of aspirin and Plavix and had outpatient vascular surgery referral for subclavian artery bypass to be done electively and in fact has an appointment to have this done by [name] on 02/09/2020. He states is done well since discharge has had no recurrent stroke or TIA symptoms. He denies symptoms of subclavian steal in the form of dizziness vertigo blurred vision with raising of arms above his shoulders on looking up. He however  does complain of some short-term memory and cognitive difficulties which are not progressive but are annoying. Is tolerating aspirin and Plavix without bruising or bleeding. He is also tolerating Lipitor well without muscle aches and pains. He still continues to smoke half pack per day and knows he needs has to quit. He has filed for disability.  ROS:   14 system review of systems is positive for tiredness, weakness, sleepiness, decreased short-term memory all other systems negative  PMH:  Past Medical History:  Diagnosis Date  . Arteriosclerotic cardiovascular disease (ASCVD) 2006, 2012   2006-acute IMI treated with urgent RCA stent; 2007-Cutting Balloon for in-stent restenosis; 02/2010-presented with ACS and minimal troponin elevation:70% LAD, 80% distal circumflex, 80% proximal ramus branch vessel,in-stent restenosis of 70% in the RCA; BMS for proximal critical RCA stenosis, restenosis Nov 2012  . Carotid artery occlusion   . Heart attack ([place])   . History of noncompliance with medical treatment    Due to financial considerations  . Hyperlipidemia   . Hypertension   . Stroke ([place])   . Tobacco abuse    40 pack years    Social History:  Social History   Socioeconomic History  . Marital status: Married    Spouse name: Not on file  . Number of children: 2  . Years of education: Not on file  . Highest education level: Not on file  Occupational History  . 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 Smoking status: Former Smoker    Packs/day: 1.00    Years: 27.00    Pack years: 27.00  Types: Cigarettes    Quit date: 12/14/2019    Years since quitting: 0.1  . Smokeless tobacco: Never Used  Substance and Sexual Activity  . Alcohol use: No    Comment:  Alcoholism- quit 2002  . Drug use: No  . Sexual activity: Not on file  Other Topics Concern  . Not on file  Social History Narrative            Social Determinants of  Health   Financial Resource Strain:   . Difficulty of Paying Living Expenses:   Food Insecurity:   . Worried About Charity fundraiser in the Last Year:   . Arboriculturist in the Last Year:   Transportation Needs:   . Film/video editor (Medical):   Marland Kitchen Lack of Transportation (Non-Medical):   Physical Activity:   . Days of Exercise per Week:   . Minutes of Exercise per Session:   Stress:   . Feeling of Stress :   Social Connections:   . Frequency of Communication with Friends and Family:   . Frequency of Social Gatherings with Friends and Family:   . Attends Religious Services:   . Active Member of Clubs or Organizations:   . Attends Archivist Meetings:   Marland Kitchen Marital Status:   Intimate Partner Violence:   . Fear of Current or Ex-Partner:   . Emotionally Abused:   Marland Kitchen Physically Abused:   . Sexually Abused:     Medications:   Current Outpatient Medications on File Prior to Visit  Medication Sig Dispense Refill  . aspirin EC 325 MG EC tablet Take 1 tablet (325 mg total) by mouth daily. 90 tablet 0  . atorvastatin (LIPITOR) 80 MG tablet Take 1 tablet (80 mg total) by mouth daily. 90 tablet 3  . clopidogrel (PLAVIX) 75 MG tablet Take 1 tablet (75 mg total) by mouth daily. 30 tablet 2  . ezetimibe (ZETIA) 10 MG tablet Take 1 tablet (10 mg total) by mouth daily. 90 tablet 3   No current facility-administered medications on file prior to visit.    Allergies:  No Known Allergies  Physical Exam General: Obese middle-aged Caucasian male, seated, in no evident distress Head: head normocephalic and atraumatic.   Neck: supple with no carotid or supraclavicular bruits Cardiovascular: regular rate and rhythm, no murmurs Musculoskeletal: no deformity Skin:  no rash/petichiae Vascular:  Normal pulses all extremities except left radial pulse is absent  Neurologic Exam Mental Status: Awake and fully alert. Oriented to place and time. Recent and remote memory intact.  Attention span, concentration and fund of knowledge appropriate. Mood and affect appropriate.  Cranial Nerves: Fundoscopic exam reveals sharp disc margins. Pupils equal, briskly reactive to light. Extraocular movements full without nystagmus. Visual fields full to confrontation. Hearing intact. Facial sensation intact. Face, tongue, palate moves normally and symmetrically.  Motor: Normal bulk and tone. Normal strength in all tested extremity muscles. Sensory.: intact to touch , pinprick , position and vibratory sensation.  Coordination: Rapid alternating movements normal in all extremities. Finger-to-nose and heel-to-shin performed accurately bilaterally. Gait and Station: Arises from chair without difficulty. Stance is normal. Gait demonstrates normal stride length and balance . Able to heel, toe and tandem walk with mild difficulty.  Reflexes: 1+ and symmetric. Toes downgoing.   NIHSS  0 Modified Rankin  2   ASSESSMENT: 49 year old Caucasian male with left frontal and parietal infarction February 2021 secondary to left carotid and subclavian occlusions s/p  failed attempts at endovascular  revascularization with multiple vascular risk factors of obesity, borderline hypertension, hyperlipidemia and this occlusive large vessel cerebrovascular disease. He is scheduled to undergo subclavian bypass and vascular surgery in a few weeks.     PLAN: I had a long d/w patient about his recent stroke,left carotid and subclavian occlusion, risk for recurrent stroke/TIAs, personally independently reviewed imaging studies and stroke evaluation results and answered questions.Continue aspirin and Plavix for 3 months and then aspirin alone for secondary stroke prevention and maintain strict control of hypertension with blood pressure goal below 130/90, diabetes with hemoglobin A1c goal below 6.5% and lipids with LDL cholesterol goal below 70 mg/dL. I also advised the patient to eat a healthy diet with plenty of  whole grains, cereals, fruits and vegetables, exercise regularly and maintain ideal body weight .he was counseled to quit smoking completely.  Referred for polysomnogram for sleep apnea which he appears to be at high risk for.  Patient is scheduled to undergo subclavian bypass surgery in a few weeks and may need to stop Plavix 3 to 5 days prior to the procedure.  Greater than 50% time during this 50-minute consultation visit was spent on counseling and coordination of care about his strokes, carotid and subclavian occlusion and discussion about revascularization and stroke prevention options and answering questions will return for follow-up in the future in 2 months with my nurse practitioner Janett Billow or call earlier if necessary. Antony Contras, MD Medical Director [facility] Pager: [phone] 01/27/2020 9:35 AM  Note: This document was prepared with digital dictation and possible smart phrase technology. Any transcriptional errors that result from this process are unintentional.

## 2020-01-27 NOTE — Patient Instructions (Addendum)
I had a long d/w patient about his recent stroke,left carotid and subclavian occlusion, risk for recurrent stroke/TIAs, personally independently reviewed imaging studies and stroke evaluation results and answered questions.Continue aspirin and Plavix for 3 months and then aspirin alone for secondary stroke prevention and maintain strict control of hypertension with blood pressure goal below 130/90, diabetes with hemoglobin A1c goal below 6.5% and lipids with LDL cholesterol goal below 70 mg/dL. I also advised the patient to eat a healthy diet with plenty of whole grains, cereals, fruits and vegetables, exercise regularly and maintain ideal body weight .he was counseled to quit smoking completely.  Referred for polysomnogram for sleep apnea which he appears to be at high risk for.  Patient is scheduled to undergo subclavian bypass surgery in a few weeks and may need to stop Plavix 3 to 5 days prior to the procedure.  Will return for follow-up in the future in 2 months with my nurse practitioner Janett Billow or call earlier if necessary.    Stroke Prevention Some medical conditions and behaviors are associated with a higher chance of having a stroke. You can help prevent a stroke by making nutrition, lifestyle, and other changes, including managing any medical conditions you may have. What nutrition changes can be made?   Eat healthy foods. You can do this by: ? Choosing foods high in fiber, such as fresh fruits and vegetables and whole grains. ? Eating at least 5 or more servings of fruits and vegetables a day. Try to fill half of your plate at each meal with fruits and vegetables. ? Choosing lean protein foods, such as lean cuts of meat, poultry without skin, fish, tofu, beans, and nuts. ? Eating low-fat dairy products. ? Avoiding foods that are high in salt (sodium). This can help lower blood pressure. ? Avoiding foods that have saturated fat, trans fat, and cholesterol. This can help prevent high  cholesterol. ? Avoiding processed and premade foods.  Follow your health care provider's specific guidelines for losing weight, controlling high blood pressure (hypertension), lowering high cholesterol, and managing diabetes. These may include: ? Reducing your daily calorie intake. ? Limiting your daily sodium intake to 1,500 milligrams (mg). ? Using only healthy fats for cooking, such as olive oil, canola oil, or sunflower oil. ? Counting your daily carbohydrate intake. What lifestyle changes can be made?  Maintain a healthy weight. Talk to your health care provider about your ideal weight.  Get at least 30 minutes of moderate physical activity at least 5 days a week. Moderate activity includes brisk walking, biking, and swimming.  Do not use any products that contain nicotine or tobacco, such as cigarettes and e-cigarettes. If you need help quitting, ask your health care provider. It may also be helpful to avoid exposure to secondhand smoke.  Limit alcohol intake to no more than 1 drink a day for nonpregnant women and 2 drinks a day for men. One drink equals 12 oz of beer, 5 oz of wine, or 1 oz of hard liquor.  Stop any illegal drug use.  Avoid taking birth control pills. Talk to your health care provider about the risks of taking birth control pills if: ? You are over 24 years old. ? You smoke. ? You get migraines. ? You have ever had a blood clot. What other changes can be made?  Manage your cholesterol levels. ? Eating a healthy diet is important for preventing high cholesterol. If cholesterol cannot be managed through diet alone, you may also need to  take medicines. ? Take any prescribed medicines to control your cholesterol as told by your health care provider.  Manage your diabetes. ? Eating a healthy diet and exercising regularly are important parts of managing your blood sugar. If your blood sugar cannot be managed through diet and exercise, you may need to take  medicines. ? Take any prescribed medicines to control your diabetes as told by your health care provider.  Control your hypertension. ? To reduce your risk of stroke, try to keep your blood pressure below 130/80. ? Eating a healthy diet and exercising regularly are an important part of controlling your blood pressure. If your blood pressure cannot be managed through diet and exercise, you may need to take medicines. ? Take any prescribed medicines to control hypertension as told by your health care provider. ? Ask your health care provider if you should monitor your blood pressure at home. ? Have your blood pressure checked every year, even if your blood pressure is normal. Blood pressure increases with age and some medical conditions.  Get evaluated for sleep disorders (sleep apnea). Talk to your health care provider about getting a sleep evaluation if you snore a lot or have excessive sleepiness.  Take over-the-counter and prescription medicines only as told by your health care provider. Aspirin or blood thinners (antiplatelets or anticoagulants) may be recommended to reduce your risk of forming blood clots that can lead to stroke.  Make sure that any other medical conditions you have, such as atrial fibrillation or atherosclerosis, are managed. What are the warning signs of a stroke? The warning signs of a stroke can be easily remembered as BEFAST.  B is for balance. Signs include: ? Dizziness. ? Loss of balance or coordination. ? Sudden trouble walking.  E is for eyes. Signs include: ? A sudden change in vision. ? Trouble seeing.  F is for face. Signs include: ? Sudden weakness or numbness of the face. ? The face or eyelid drooping to one side.  A is for arms. Signs include: ? Sudden weakness or numbness of the arm, usually on one side of the body.  S is for speech. Signs include: ? Trouble speaking (aphasia). ? Trouble understanding.  T is for time. ? These symptoms may  represent a serious problem that is an emergency. Do not wait to see if the symptoms will go away. Get medical help right away. Call your local emergency services (911 in the [place] Do not drive yourself to the hospital.  Other signs of stroke may include: ? A sudden, severe headache with no known cause. ? Nausea or vomiting. ? Seizure. Where to find more information For more information, visit:  American Stroke Association: www.strokeassociation.org  National Stroke Association: www.stroke.org Summary  You can prevent a stroke by eating healthy, exercising, not smoking, limiting alcohol intake, and managing any medical conditions you may have.  Do not use any products that contain nicotine or tobacco, such as cigarettes and e-cigarettes. If you need help quitting, ask your health care provider. It may also be helpful to avoid exposure to secondhand smoke.  Remember BEFAST for warning signs of stroke. Get help right away if you or a loved one has any of these signs. This information is not intended to replace advice given to you by your health care provider. Make sure you discuss any questions you have with your health care provider. Document Revised: 09/21/2017 Document Reviewed: 11/14/2016 Elsevier Patient Education  2020 [employer].

## 2020-02-03 NOTE — Progress Notes (Signed)
[place] [id number] [place] [address] [place] [address] Phone: [phone] Fax: [phone]      Your procedure is scheduled on Monday, April 19th.  Report to [facility] Main Entrance "A" at 5:30 A.M., and check in at the Admitting office.  Call this number if you have problems the morning of surgery:  [phone]  Call [phone] if you have any questions prior to your surgery date Monday-Friday 8am-4pm    Remember:  Do not eat or drink after midnight the night before your surgery     Take these medicines the morning of surgery with A SIP OF WATER   Atorvastatin (Lipitor)  Ezetimibe (Zetia)  Follow your surgeon's instructions on when to stop Aspirin & Plavix.  If no instructions were given by your surgeon then you will need to call the office to get those instructions.    As of today, STOP taking any Aspirin (unless otherwise instructed by your surgeon) and Aspirin containing products, Aleve, Naproxen, Ibuprofen, Motrin, Advil, Goody's, BC's, all herbal medications, fish oil, and all vitamins.                      Do not wear jewelry.            Do not wear lotions, powders, colognes, or deodorant.            Men may shave face and neck.            Do not bring valuables to the hospital.            [facility] is not responsible for any belongings or valuables.  Do NOT Smoke (Tobacco/Vapping) or drink Alcohol 24 hours prior to your procedure If you use a CPAP at night, you may bring all equipment for your overnight stay.   Contacts, glasses, dentures or bridgework may not be worn into surgery.      For patients admitted to the hospital, discharge time will be determined by your treatment team.   Patients discharged the day of surgery will not be allowed to drive home, and someone needs to stay with them for 24 hours.    Special instructions:   [facility]- Preparing For [facility]  Before surgery, you can play an  important role. Because skin is not sterile, your skin needs to be as free of germs as possible. You can reduce the number of germs on your skin by washing with CHG (chlorahexidine gluconate) Soap before surgery.  CHG is an antiseptic cleaner which kills germs and bonds with the skin to continue killing germs even after washing.    Oral Hygiene is also important to reduce your risk of infection.  Remember - BRUSH YOUR TEETH THE MORNING OF SURGERY WITH YOUR REGULAR TOOTHPASTE  Please do not use if you have an allergy to CHG or antibacterial soaps. If your skin becomes reddened/irritated stop using the CHG.  Do not shave (including legs and underarms) for at least 48 hours prior to first CHG shower. It is OK to shave your face.  Please follow these instructions carefully.   1. Shower the NIGHT BEFORE SURGERY and the MORNING OF SURGERY with CHG Soap.   2. If you chose to wash your hair, wash your hair first as usual with your normal shampoo.  3. After you shampoo, rinse your hair and body thoroughly to remove the shampoo.  4. Use CHG as you would any other liquid  soap. You can apply CHG directly to the skin and wash gently with a scrungie or a clean washcloth.   5. Apply the CHG Soap to your body ONLY FROM THE NECK DOWN.  Do not use on open wounds or open sores. Avoid contact with your eyes, ears, mouth and genitals (private parts). Wash Face and genitals (private parts)  with your normal soap.   6. Wash thoroughly, paying special attention to the area where your surgery will be performed.  7. Thoroughly rinse your body with warm water from the neck down.  8. DO NOT shower/wash with your normal soap after using and rinsing off the CHG Soap.  9. Pat yourself dry with a CLEAN TOWEL.  10. Wear CLEAN PAJAMAS to bed the night before surgery, wear comfortable clothes the morning of surgery  11. Place CLEAN SHEETS on your bed the night of your first shower and DO NOT SLEEP WITH PETS.   Day of  Surgery:   Do not apply any deodorants/lotions.  Please wear clean clothes to the hospital/surgery center.   Remember to brush your teeth WITH YOUR REGULAR TOOTHPASTE.   Please read over the following fact sheets that you were given.

## 2020-02-04 ENCOUNTER — Other Ambulatory Visit: Payer: Self-pay

## 2020-02-04 ENCOUNTER — Encounter (HOSPITAL_COMMUNITY): Payer: Self-pay

## 2020-02-04 ENCOUNTER — Encounter (HOSPITAL_COMMUNITY)
Admission: RE | Admit: 2020-02-04 | Discharge: 2020-02-04 | Disposition: A | Discharge status: Home / Self Care | Payer: Self-pay | Source: Ambulatory Visit | Attending: Vascular Surgery | Admitting: Vascular Surgery

## 2020-02-04 DIAGNOSIS — E669 Obesity, unspecified: Secondary | ICD-10-CM | POA: Insufficient documentation

## 2020-02-04 DIAGNOSIS — Z8673 Personal history of transient ischemic attack (TIA), and cerebral infarction without residual deficits: Secondary | ICD-10-CM | POA: Insufficient documentation

## 2020-02-04 DIAGNOSIS — F17210 Nicotine dependence, cigarettes, uncomplicated: Secondary | ICD-10-CM | POA: Insufficient documentation

## 2020-02-04 DIAGNOSIS — I2510 Atherosclerotic heart disease of native coronary artery without angina pectoris: Secondary | ICD-10-CM | POA: Insufficient documentation

## 2020-02-04 DIAGNOSIS — Z6841 Body Mass Index (BMI) 40.0 and over, adult: Secondary | ICD-10-CM | POA: Insufficient documentation

## 2020-02-04 DIAGNOSIS — Z7902 Long term (current) use of antithrombotics/antiplatelets: Secondary | ICD-10-CM | POA: Insufficient documentation

## 2020-02-04 DIAGNOSIS — Z01812 Encounter for preprocedural laboratory examination: Secondary | ICD-10-CM | POA: Insufficient documentation

## 2020-02-04 DIAGNOSIS — I10 Essential (primary) hypertension: Secondary | ICD-10-CM | POA: Insufficient documentation

## 2020-02-04 DIAGNOSIS — Z7982 Long term (current) use of aspirin: Secondary | ICD-10-CM | POA: Insufficient documentation

## 2020-02-04 DIAGNOSIS — I771 Stricture of artery: Secondary | ICD-10-CM | POA: Insufficient documentation

## 2020-02-04 DIAGNOSIS — I252 Old myocardial infarction: Secondary | ICD-10-CM | POA: Insufficient documentation

## 2020-02-04 DIAGNOSIS — E785 Hyperlipidemia, unspecified: Secondary | ICD-10-CM | POA: Insufficient documentation

## 2020-02-04 DIAGNOSIS — Z79899 Other long term (current) drug therapy: Secondary | ICD-10-CM | POA: Insufficient documentation

## 2020-02-04 LAB — URINALYSIS, ROUTINE W REFLEX MICROSCOPIC
Bilirubin Urine: NEGATIVE
Glucose, UA: NEGATIVE mg/dL
Hgb urine dipstick: NEGATIVE
Ketones, ur: NEGATIVE mg/dL
Leukocytes,Ua: NEGATIVE
Nitrite: NEGATIVE
Protein, ur: 30 mg/dL — AB
Specific Gravity, Urine: 1.025 (ref 1.005–1.030)
pH: 5 (ref 5.0–8.0)

## 2020-02-04 LAB — COMPREHENSIVE METABOLIC PANEL
ALT: 50 U/L — ABNORMAL HIGH (ref 0–44)
AST: 44 U/L — ABNORMAL HIGH (ref 15–41)
Albumin: 3.7 g/dL (ref 3.5–5.0)
Alkaline Phosphatase: 86 U/L (ref 38–126)
Anion gap: 8 (ref 5–15)
BUN: 9 mg/dL (ref 6–20)
CO2: 27 mmol/L (ref 22–32)
Calcium: 9.3 mg/dL (ref 8.9–10.3)
Chloride: 104 mmol/L (ref 98–111)
Creatinine, Ser: 0.81 mg/dL (ref 0.61–1.24)
GFR calc Af Amer: >60 mL/min (ref >60)
GFR calc non Af Amer: >60 mL/min (ref >60)
Glucose, Bld: 163 mg/dL — ABNORMAL HIGH (ref 70–99)
Potassium: 3.5 mmol/L (ref 3.5–5.1)
Sodium: 139 mmol/L (ref 135–145)
Total Bilirubin: 0.6 mg/dL (ref 0.3–1.2)
Total Protein: 6.9 g/dL (ref 6.5–8.1)

## 2020-02-04 LAB — PROTIME-INR
INR: 0.9 (ref 0.8–1.2)
Prothrombin Time: 12.5 seconds (ref 11.4–15.2)

## 2020-02-04 LAB — CBC
HCT: 47.5 % (ref 39.0–52.0)
Hemoglobin: 16.1 g/dL (ref 13.0–17.0)
MCH: 30.8 pg (ref 26.0–34.0)
MCHC: 33.9 g/dL (ref 30.0–36.0)
MCV: 91 fL (ref 80.0–100.0)
Platelets: 184 10*3/uL (ref 150–400)
RBC: 5.22 MIL/uL (ref 4.22–5.81)
RDW: 13.2 % (ref 11.5–15.5)
WBC: 9.4 10*3/uL (ref 4.0–10.5)
nRBC: 0 % (ref 0.0–0.2)

## 2020-02-04 LAB — SURGICAL PCR SCREEN
MRSA, PCR: NEGATIVE
Staphylococcus aureus: POSITIVE — AB

## 2020-02-04 LAB — TYPE AND SCREEN
ABO/RH(D): A NEG
Antibody Screen: NEGATIVE

## 2020-02-04 LAB — ABO/RH: ABO/RH(D): A NEG

## 2020-02-04 LAB — APTT: aPTT: 27 seconds (ref 24–36)

## 2020-02-04 NOTE — Progress Notes (Signed)
PCP - Dr. Jill Alexanders Cardiologist - Dr. Gwenlyn Found  PPM/ICD - Denies  Chest x-ray - 12/15/19 EKG - 12/15/19 Stress Test - 2012 ECHO - 12/15/19 Cardiac Cath - 2012  Sleep Study - Stop Bang 5 Patient stated he is scheduled for a sleep study consultation on 02/23/20 With Dr. Star Age  DM Denies  Blood Thinner Instructions: Patient stated he has already stopped taking his Plavix per Dr. Oneida Alar instructions Aspirin Instructions: Patient was unsure and was instructed to call Dr. Nona Dell office to get clarification on his aspirin.   COVID TEST- 02/06/20  Anesthesia review: Yes recent hospitalization for stroke in February 2021  Patient denies fever, cough and chest pain at PAT appointment Patient did state he feels SOB on exertion / climbing stairs at times at baseline.   All instructions explained to the patient, with a verbal understanding of the material. Patient agrees to go over the instructions while at home for a better understanding. Patient also instructed to self quarantine after being tested for COVID-19. The opportunity to ask questions was provided.

## 2020-02-04 NOTE — Progress Notes (Addendum)
Left message with Dr. Oneida Alar office to notify him of abnormal UA and get clarification on Aspirin instruction.   Called Dr. Oneida Alar office again and spoke to [facility] who was aware of the UA result and had addressed it.  Also stated patient should continue the Aspirin.

## 2020-02-05 NOTE — Progress Notes (Signed)
Anesthesia Chart Review:  Case: [id number] Date/Time: 02/09/20 0715   Procedure: BYPASS GRAFT CAROTID-SUBCLAVIAN (Left )   Anesthesia type: General   Pre-op diagnosis: OCCLUSION OF LEFT SUBCLAVIAN ARTERY   [facility] / [facility]   Surgeons: Elam Dutch, MD      DISCUSSION: Patient is a 49 year old male scheduled for the above procedure. February admission for TIA/subacute left frontal/parietal infarcts felt related to left brain hypoperfusion from left ICA occlusion. Unsuccessful attempt at revascularization by IR, so referred to vascular surgery.    History includes smoking, HTN, HLD, CAD (inferior MI s/p RCA DES 01/20/05; cutting balloon arthrotomy RCA for ISR 04/03/06; s/p BMS RCA for ISR with angina 02/24/10; inferior STEMI, s/p aspiration thrombectomy/angioplast occluded RCA 08/20/11, carotid artery disease (left ICA occlusion diagnosed 12/10/19), CVA (12/14/19, not candidate for tPA due to late presentation; occluded left ICA and left SCA with unsuccessful attempt for revascularization by IR; CVA/TIA felt due to left brain hypoperfusion from left ICA occlusion), non-compliance (due to finances). BMI is consistent with morbid obesity.   - Preoperative cardiology input outlined on 12/18/19 by Leanor Kail, PA, "...KOALII INGS was last seen on 11/28/19 by Dr. Gwenlyn Found. He was doing well on cardiac stand point and 41yr follow up recommended.   Therefore, based on ACC/AHA guidelines, the patient would be at acceptable risk for the planned procedure without further cardiovascular testing."  - Neurologist Dr. Leonie Man aware of surgery plans and wrote patient may need hold Plavix prior. Advised to hold Plavix for 7 days and continue ASA per Dr. Oneida Alar.    Presurgical COVID-19 test scheduled for 02/06/20. Anesthesia team to evaluate on the day of surgery.    VS: BP (!) 148/77   Pulse 84   Temp (!) 36.4 C (Oral)   Resp 18   Ht 5\' 9"  (1.753 m)   Wt (!) 136.8 kg   SpO2 96%   BMI  44.54 kg/m      PROVIDERS: Denita Lung, MD is PCP  - [name], MD is primary cardiologist. Last visit 11/28/19 with telemedicine visit 12/12/19 for DOT evaluation. LICA noted to be occluded by [place]. He also sees Hilty, Nadean Corwin, MD at the [facility], last 12/23/19.  Antony Contras, MD is neurologist. He is also scheduled for sleep study consultation with Star Age, MD on 02/23/20.    LABS: Labs reviewed: Acceptable for surgery. A1c 6.1% 12/15/19.  (all labs ordered are listed, but only abnormal results are displayed)  Labs Reviewed  SURGICAL PCR SCREEN - Abnormal; Notable for the following components:      Result Value   Staphylococcus aureus POSITIVE (*)    All other components within normal limits  COMPREHENSIVE METABOLIC PANEL - Abnormal; Notable for the following components:   Glucose, Bld 163 (*)    AST 44 (*)    ALT 50 (*)    All other components within normal limits  URINALYSIS, ROUTINE W REFLEX MICROSCOPIC - Abnormal; Notable for the following components:   APPearance HAZY (*)    Protein, ur 30 (*)    Bacteria, UA RARE (*)    All other components within normal limits  APTT  CBC  PROTIME-INR  TYPE AND SCREEN  ABO/RH    IMAGES: CT Head 12/15/19: IMPRESSION: Small acute left frontal and parietal infarcts with mild progression from yesterday. No hemorrhagic conversion.  CTA head/neck 12/14/19: IMPRESSION: - Occlusion of the left internal carotid artery at the bifurcation. Reconstituted flow in  the left carotid siphon likely due to external to internal collaterals, patent anterior communicating artery and patent posterior communicating artery on the left. No large or medium vessel intracranial occlusion is identified. 6 cc region of completed infarction at the left parietal vertex. 208 cc region of T-max greater than 6 seconds throughout the left MCA territory due to the reconstituted flow described above. - Proximal left subclavian artery occlusion.  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   EKG: 12/14/19:  Sinus rhythm Inferior infarct, age indeterminate Interpretation limited secondary to artifact Confirmed by Ripley Fraise [phone]) on 12/15/2019 8:12:15 AM - Overall, appears stable when compared to 11/28/19 tracing.   CV: Carotid US 12/10/19: Summary:  - Right Carotid: Velocities in the right ICA are consistent with a 1-39% stenosis. Non-hemodynamically significant plaque <50% noted in the CCA.  - Left Carotid: The internal carotid artery appears to be occluded.  - Vertebrals: Right vertebral artery demonstrates antegrade flow. Left vertebral artery demonstrates retrograde flow.  - Subclavians: Normal flow hemodynamics were seen in the right subclavian artery. Monophasic flow in the left subclavian artery.  - Vascular consult recommended.    Echo 12/15/19: IMPRESSIONS  1. Left ventricular ejection fraction, by estimation, is 55 to 60%. The  left ventricle has normal function. Left ventricular endocardial border  not optimally defined to evaluate regional wall motion. The left  ventricular internal cavity size was mildly  dilated. Left ventricular diastolic parameters are indeterminate. Elevated  left ventricular end-diastolic pressure.  2. Right ventricular systolic function is normal. The right ventricular  size is normal. Tricuspid regurgitation signal is inadequate for assessing  PA pressure.  3. The mitral valve is normal in structure and function. No evidence of  mitral valve regurgitation. No evidence of mitral stenosis.  4. The aortic valve is tricuspid. Aortic valve regurgitation is trivial.  No aortic stenosis is present.  5. The inferior vena cava is normal in size with greater than 50%  respiratory variability, suggesting right atrial pressure of 3 mmHg.    ETT 01/22/18:  There was no ST segment  deviation noted during stress.  Rare PVCs noted during stress, occasional PVCs noted during recovery.  4 minutes and 5 seconds exercise time, poor  Overall low risk exercise treadmill test with no electrocardiographic evidence of ischemia. Poor conditioning.   Cardiac cath 08/20/11: FINAL IMPRESSION: 1. Successful cutting balloon and noncompliant balloon percutaneous     transluminal coronary angioplasty of in-stent restenosis and 100%     thrombotic occlusion of the proximal-to-mid right coronary artery     stents reducing 100% to less than 10% residual lesion.     The decision was made not to proceed with restenting as the      patient already has 2 layers of stent and I was reluctant to put      another stent that has proven to be not done well with stents in      the past.  There is also questionable left anterior descending      artery and circumflex lesions which may have progressed from      previous evaluation. 2. Mostly preserved left ventricular ejection fraction with mild mid     basal inferior hypokinesis. 3. Diffusely small-caliber coronary arteries and least moderate     lesions in the circumflex AV groove portion as well as proximal mid     LAD.   Past Medical History:  Diagnosis  Date  . Arteriosclerotic cardiovascular disease (ASCVD) 2006, 2012   2006-acute IMI treated with urgent RCA stent; 2007-Cutting Balloon for in-stent restenosis; 02/2010-presented with ACS and minimal troponin elevation:70% LAD, 80% distal circumflex, 80% proximal ramus branch vessel,in-stent restenosis of 70% in the RCA; BMS for proximal critical RCA stenosis, restenosis Nov 2012  . Carotid artery occlusion   . Heart attack ([place])   . History of noncompliance with medical treatment    Due to financial considerations  . Hyperlipidemia   . Hypertension   . Stroke ([place])   . Tobacco abuse    40 pack years    Past Surgical History:  Procedure Laterality Date  . CORONARY ANGIOPLASTY WITH  STENT PLACEMENT    . IR ANGIO EXTERNAL CAROTID SEL EXT CAROTID BILAT MOD SED  12/15/2019  . IR ANGIO VERTEBRAL SEL VERTEBRAL UNI R MOD SED  12/15/2019  . IR ANGIOGRAM EXTREMITY LEFT  12/15/2019  . IR ANGIOGRAM EXTREMITY LEFT  12/15/2019  . IR CT HEAD LTD  12/15/2019  . IR PERCUTANEOUS ART THROMBECTOMY/INFUSION INTRACRANIAL INC DIAG ANGIO  12/15/2019  . RADIOLOGY WITH ANESTHESIA N/A 12/15/2019   Procedure: IR WITH ANESTHESIA;  Surgeon: Luanne Bras, MD;  Location: [facility];  Service: Radiology;  Laterality: N/A;    MEDICATIONS: . aspirin EC 325 MG EC tablet  . atorvastatin (LIPITOR) 80 MG tablet  . clopidogrel (PLAVIX) 75 MG tablet  . ezetimibe (ZETIA) 10 MG tablet   No current facility-administered medications for this encounter.     Myra Gianotti, PA-C Surgical Short Stay/Anesthesiology [facility] Phone [phone] [facility] [place] Phone [phone] 02/05/2020 12:58 PM

## 2020-02-05 NOTE — Anesthesia Preprocedure Evaluation (Addendum)
Anesthesia Evaluation  Patient identified by MRN, date of birth, ID band Patient awake    Reviewed: Allergy & Precautions, NPO status , Patient's Chart, lab work & pertinent test results  History of Anesthesia Complications Negative for: history of anesthetic complications  Airway Mallampati: III  TM Distance: >3 FB Neck ROM: Full    Dental  (+) Poor Dentition, Loose, Missing, Dental Advisory Given   Pulmonary sleep apnea , Current Smoker and Patient abstained from smoking.,    Pulmonary exam normal        Cardiovascular hypertension, + Past MI and + Cardiac Stents  Normal cardiovascular exam   Sublcavian steal syndrome  '21 TTE - EF 55 to 60%. LV was mildly dilated. Elevated  left ventricular end-diastolic pressure. Trivial AI.  '21 Carotid US - Right Carotid: 1-39% stenosis.  Left Carotid: The internal carotid artery appears to be occluded.  Vertebrals: Right vertebral artery demonstrates antegrade flow. Left vertebral artery demonstrates retrograde flow.  Subclavians: Normal flow hemodynamics were seen in the right subclavian artery. Monophasic flow in the left subclavian artery.   Preoperative cardiology input outlined on 12/18/19 by Leanor Kail, PA, "...EDYN NORDELL last seen on 2/5/21by Dr. Gwenlyn Found. He was doing well on cardiac stand point and 11yr follow up recommended. Therefore, based on ACC/AHA guidelines, the patient would be at acceptable risk for the planned procedure without further cardiovascular testing."    Neuro/Psych PSYCHIATRIC DISORDERS Depression CVA, No Residual Symptoms    GI/Hepatic Neg liver ROS, GERD  Controlled,  Endo/Other  Morbid obesity  Renal/GU negative Renal ROS     Musculoskeletal negative musculoskeletal ROS (+)   Abdominal   Peds  Hematology  Last dose of plavix 1 week ago    Anesthesia Other Findings Noncompliance Covid neg 02/06/20    Reproductive/Obstetrics                           Anesthesia Physical Anesthesia Plan  ASA: III  Anesthesia Plan: General   Post-op Pain Management:    Induction: Intravenous  PONV Risk Score and Plan: 3 and Treatment may vary due to age or medical condition, Ondansetron, Dexamethasone and Midazolam  Airway Management Planned: Oral ETT and Video Laryngoscope Planned  Additional Equipment: Arterial line  Intra-op Plan:   Post-operative Plan: Extubation in OR  Informed Consent: I have reviewed the patients History and Physical, chart, labs and discussed the procedure including the risks, benefits and alternatives for the proposed anesthesia with the patient or authorized representative who has indicated his/her understanding and acceptance.     Dental advisory given  Plan Discussed with: CRNA and Anesthesiologist  Anesthesia Plan Comments:       Anesthesia Quick Evaluation

## 2020-02-06 ENCOUNTER — Other Ambulatory Visit (HOSPITAL_COMMUNITY)
Admission: RE | Admit: 2020-02-06 | Discharge: 2020-02-06 | Disposition: A | Discharge status: Home / Self Care | Payer: Self-pay | Source: Ambulatory Visit | Attending: Vascular Surgery | Admitting: Vascular Surgery

## 2020-02-06 ENCOUNTER — Other Ambulatory Visit: Payer: Self-pay

## 2020-02-06 DIAGNOSIS — Z01812 Encounter for preprocedural laboratory examination: Secondary | ICD-10-CM | POA: Insufficient documentation

## 2020-02-06 DIAGNOSIS — Z20822 Contact with and (suspected) exposure to covid-19: Secondary | ICD-10-CM | POA: Insufficient documentation

## 2020-02-06 MED ORDER — DEXTROSE 5 % IV SOLN
3.0000 g | INTRAVENOUS | Status: AC
  Administered 2020-02-09 (×2): 3 g via INTRAVENOUS
  Filled 2020-02-06: qty 3

## 2020-02-07 LAB — SARS CORONAVIRUS 2 (TAT 6-24 HRS): SARS Coronavirus 2: NEGATIVE

## 2020-02-09 ENCOUNTER — Inpatient Hospital Stay (HOSPITAL_COMMUNITY): Payer: Self-pay

## 2020-02-09 ENCOUNTER — Encounter (HOSPITAL_COMMUNITY)
Admission: RE | Disposition: A | Discharge status: Home / Self Care | Payer: Self-pay | Source: Home / Self Care | Attending: Vascular Surgery

## 2020-02-09 ENCOUNTER — Inpatient Hospital Stay (HOSPITAL_COMMUNITY)
Admission: RE | Admit: 2020-02-09 | Discharge: 2020-02-10 | DRG: 253 | Disposition: A | Discharge status: Home / Self Care | Payer: Self-pay | Attending: Vascular Surgery | Admitting: Vascular Surgery

## 2020-02-09 ENCOUNTER — Encounter (HOSPITAL_COMMUNITY): Payer: Self-pay | Admitting: Vascular Surgery

## 2020-02-09 ENCOUNTER — Inpatient Hospital Stay (HOSPITAL_COMMUNITY): Payer: Self-pay | Admitting: Vascular Surgery

## 2020-02-09 ENCOUNTER — Inpatient Hospital Stay (HOSPITAL_COMMUNITY): Payer: Self-pay | Admitting: Certified Registered Nurse Anesthetist

## 2020-02-09 ENCOUNTER — Other Ambulatory Visit: Payer: Self-pay

## 2020-02-09 DIAGNOSIS — E785 Hyperlipidemia, unspecified: Secondary | ICD-10-CM | POA: Diagnosis present

## 2020-02-09 DIAGNOSIS — G4730 Sleep apnea, unspecified: Secondary | ICD-10-CM | POA: Diagnosis present

## 2020-02-09 DIAGNOSIS — Z7902 Long term (current) use of antithrombotics/antiplatelets: Secondary | ICD-10-CM

## 2020-02-09 DIAGNOSIS — I2510 Atherosclerotic heart disease of native coronary artery without angina pectoris: Secondary | ICD-10-CM | POA: Diagnosis present

## 2020-02-09 DIAGNOSIS — I771 Stricture of artery: Secondary | ICD-10-CM

## 2020-02-09 DIAGNOSIS — I6522 Occlusion and stenosis of left carotid artery: Secondary | ICD-10-CM | POA: Diagnosis present

## 2020-02-09 DIAGNOSIS — I252 Old myocardial infarction: Secondary | ICD-10-CM

## 2020-02-09 DIAGNOSIS — Z955 Presence of coronary angioplasty implant and graft: Secondary | ICD-10-CM

## 2020-02-09 DIAGNOSIS — F17210 Nicotine dependence, cigarettes, uncomplicated: Secondary | ICD-10-CM | POA: Diagnosis present

## 2020-02-09 DIAGNOSIS — I10 Essential (primary) hypertension: Secondary | ICD-10-CM | POA: Diagnosis present

## 2020-02-09 DIAGNOSIS — Z6841 Body Mass Index (BMI) 40.0 and over, adult: Secondary | ICD-10-CM

## 2020-02-09 DIAGNOSIS — Z7982 Long term (current) use of aspirin: Secondary | ICD-10-CM

## 2020-02-09 DIAGNOSIS — Z79899 Other long term (current) drug therapy: Secondary | ICD-10-CM

## 2020-02-09 DIAGNOSIS — Z8673 Personal history of transient ischemic attack (TIA), and cerebral infarction without residual deficits: Secondary | ICD-10-CM

## 2020-02-09 DIAGNOSIS — I708 Atherosclerosis of other arteries: Principal | ICD-10-CM | POA: Diagnosis present

## 2020-02-09 HISTORY — PX: CAROTID-SUBCLAVIAN BYPASS GRAFT: SHX910

## 2020-02-09 SURGERY — CREATION, BYPASS, ARTERIAL, SUBCLAVIAN TO CAROTID, USING GRAFT
Anesthesia: General | Laterality: Left

## 2020-02-09 MED ORDER — ACETAMINOPHEN 325 MG RE SUPP: 325.0000 mg | RECTAL | Status: DC | PRN

## 2020-02-09 MED ORDER — METOPROLOL TARTRATE 5 MG/5ML IV SOLN
INTRAVENOUS | Status: DC | PRN
  Administered 2020-02-09: 1 mg via INTRAVENOUS
  Administered 2020-02-09: 2 mg via INTRAVENOUS

## 2020-02-09 MED ORDER — SODIUM CHLORIDE 0.9 % IV SOLN
INTRAVENOUS | Status: DC | PRN
  Administered 2020-02-09: 500 mL

## 2020-02-09 MED ORDER — CEFAZOLIN SODIUM-DEXTROSE 2-4 GM/100ML-% IV SOLN
2.0000 g | Freq: Three times a day (TID) | INTRAVENOUS | Status: AC
  Administered 2020-02-09 – 2020-02-10 (×2): 2 g via INTRAVENOUS
  Filled 2020-02-09 (×2): qty 100

## 2020-02-09 MED ORDER — DEXAMETHASONE SODIUM PHOSPHATE 10 MG/ML IJ SOLN
INTRAMUSCULAR | Status: DC | PRN
  Administered 2020-02-09: 10 mg via INTRAVENOUS

## 2020-02-09 MED ORDER — SODIUM CHLORIDE 0.9 % IV SOLN
INTRAVENOUS | Status: AC
  Filled 2020-02-09: qty 1.2

## 2020-02-09 MED ORDER — PROPOFOL 10 MG/ML IV BOLUS
INTRAVENOUS | Status: AC
  Filled 2020-02-09: qty 20

## 2020-02-09 MED ORDER — FENTANYL CITRATE (PF) 250 MCG/5ML IJ SOLN
INTRAMUSCULAR | Status: DC | PRN
  Administered 2020-02-09: 150 ug via INTRAVENOUS
  Administered 2020-02-09: 50 ug via INTRAVENOUS
  Administered 2020-02-09 (×2): 25 ug via INTRAVENOUS
  Administered 2020-02-09: 100 ug via INTRAVENOUS
  Administered 2020-02-09 (×2): 50 ug via INTRAVENOUS
  Administered 2020-02-09 (×2): 25 ug via INTRAVENOUS

## 2020-02-09 MED ORDER — MORPHINE SULFATE (PF) 2 MG/ML IV SOLN
2.0000 mg | INTRAVENOUS | Status: DC | PRN
  Administered 2020-02-09 (×2): 2 mg via INTRAVENOUS
  Filled 2020-02-09 (×2): qty 1

## 2020-02-09 MED ORDER — PHENYLEPHRINE 40 MCG/ML (10ML) SYRINGE FOR IV PUSH (FOR BLOOD PRESSURE SUPPORT)
PREFILLED_SYRINGE | INTRAVENOUS | Status: AC
  Filled 2020-02-09: qty 20

## 2020-02-09 MED ORDER — OXYCODONE-ACETAMINOPHEN 5-325 MG PO TABS
1.0000 | ORAL_TABLET | ORAL | Status: DC | PRN
  Administered 2020-02-09: 2 via ORAL
  Administered 2020-02-10: 1 via ORAL
  Filled 2020-02-09: qty 2
  Filled 2020-02-09: qty 1

## 2020-02-09 MED ORDER — MAGNESIUM SULFATE 2 GM/50ML IV SOLN: 2.0000 g | Freq: Every day | INTRAVENOUS | Status: DC | PRN

## 2020-02-09 MED ORDER — SUGAMMADEX SODIUM 200 MG/2ML IV SOLN
INTRAVENOUS | Status: DC | PRN
  Administered 2020-02-09: 280 mg via INTRAVENOUS

## 2020-02-09 MED ORDER — EPHEDRINE 5 MG/ML INJ
INTRAVENOUS | Status: AC
  Filled 2020-02-09: qty 10

## 2020-02-09 MED ORDER — PROPOFOL 10 MG/ML IV BOLUS
INTRAVENOUS | Status: DC | PRN
  Administered 2020-02-09: 50 mg via INTRAVENOUS
  Administered 2020-02-09: 150 mg via INTRAVENOUS

## 2020-02-09 MED ORDER — SODIUM CHLORIDE 0.9 % IV SOLN: 500.0000 mL | Freq: Once | INTRAVENOUS | Status: DC | PRN

## 2020-02-09 MED ORDER — OXYCODONE HCL 5 MG PO TABS: 5.0000 mg | ORAL_TABLET | Freq: Once | ORAL | Status: DC | PRN

## 2020-02-09 MED ORDER — PROMETHAZINE HCL 25 MG/ML IJ SOLN: 6.2500 mg | INTRAMUSCULAR | Status: DC | PRN

## 2020-02-09 MED ORDER — LABETALOL HCL 5 MG/ML IV SOLN
INTRAVENOUS | Status: AC
  Filled 2020-02-09: qty 4

## 2020-02-09 MED ORDER — ONDANSETRON HCL 4 MG/2ML IJ SOLN
INTRAMUSCULAR | Status: AC
  Filled 2020-02-09: qty 2

## 2020-02-09 MED ORDER — ONDANSETRON HCL 4 MG/2ML IJ SOLN
INTRAMUSCULAR | Status: DC | PRN
  Administered 2020-02-09: 4 mg via INTRAVENOUS

## 2020-02-09 MED ORDER — PANTOPRAZOLE SODIUM 40 MG PO TBEC
40.0000 mg | DELAYED_RELEASE_TABLET | Freq: Every day | ORAL | Status: DC
  Administered 2020-02-10: 40 mg via ORAL
  Filled 2020-02-09: qty 1

## 2020-02-09 MED ORDER — METOPROLOL TARTRATE 5 MG/5ML IV SOLN: 2.0000 mg | INTRAVENOUS | Status: DC | PRN

## 2020-02-09 MED ORDER — ACETAMINOPHEN 325 MG PO TABS: 325.0000 mg | ORAL_TABLET | ORAL | Status: DC | PRN

## 2020-02-09 MED ORDER — 0.9 % SODIUM CHLORIDE (POUR BTL) OPTIME
TOPICAL | Status: DC | PRN
  Administered 2020-02-09: 2000 mL

## 2020-02-09 MED ORDER — SUGAMMADEX SODIUM 500 MG/5ML IV SOLN
INTRAVENOUS | Status: AC
  Filled 2020-02-09: qty 5

## 2020-02-09 MED ORDER — ATORVASTATIN CALCIUM 80 MG PO TABS
80.0000 mg | ORAL_TABLET | Freq: Every day | ORAL | Status: DC
  Administered 2020-02-10: 80 mg via ORAL
  Filled 2020-02-09: qty 1

## 2020-02-09 MED ORDER — ONDANSETRON HCL 4 MG/2ML IJ SOLN: 4.0000 mg | Freq: Four times a day (QID) | INTRAMUSCULAR | Status: DC | PRN

## 2020-02-09 MED ORDER — CHLORHEXIDINE GLUCONATE CLOTH 2 % EX PADS: 6.0000 | MEDICATED_PAD | Freq: Once | CUTANEOUS | Status: DC

## 2020-02-09 MED ORDER — SODIUM CHLORIDE 0.9 % IV SOLN: INTRAVENOUS | Status: DC

## 2020-02-09 MED ORDER — POTASSIUM CHLORIDE CRYS ER 20 MEQ PO TBCR: 20.0000 meq | EXTENDED_RELEASE_TABLET | Freq: Every day | ORAL | Status: DC | PRN

## 2020-02-09 MED ORDER — ESMOLOL HCL 100 MG/10ML IV SOLN
INTRAVENOUS | Status: DC | PRN
  Administered 2020-02-09 (×4): 20 mg via INTRAVENOUS

## 2020-02-09 MED ORDER — FENTANYL CITRATE (PF) 250 MCG/5ML IJ SOLN
INTRAMUSCULAR | Status: AC
  Filled 2020-02-09: qty 5

## 2020-02-09 MED ORDER — MIDAZOLAM HCL 2 MG/2ML IJ SOLN
INTRAMUSCULAR | Status: AC
  Filled 2020-02-09: qty 2

## 2020-02-09 MED ORDER — LIDOCAINE 2% (20 MG/ML) 5 ML SYRINGE
INTRAMUSCULAR | Status: DC | PRN
  Administered 2020-02-09: 100 mg via INTRAVENOUS

## 2020-02-09 MED ORDER — OXYCODONE HCL 5 MG/5ML PO SOLN: 5.0000 mg | Freq: Once | ORAL | Status: DC | PRN

## 2020-02-09 MED ORDER — ROCURONIUM BROMIDE 10 MG/ML (PF) SYRINGE
PREFILLED_SYRINGE | INTRAVENOUS | Status: DC | PRN
  Administered 2020-02-09: 20 mg via INTRAVENOUS
  Administered 2020-02-09: 30 mg via INTRAVENOUS
  Administered 2020-02-09: 60 mg via INTRAVENOUS
  Administered 2020-02-09: 20 mg via INTRAVENOUS
  Administered 2020-02-09: 30 mg via INTRAVENOUS
  Administered 2020-02-09: 40 mg via INTRAVENOUS

## 2020-02-09 MED ORDER — LACTATED RINGERS IV SOLN: INTRAVENOUS | Status: DC | PRN

## 2020-02-09 MED ORDER — ALUM & MAG HYDROXIDE-SIMETH 200-200-20 MG/5ML PO SUSP: 15.0000 mL | ORAL | Status: DC | PRN

## 2020-02-09 MED ORDER — HEPARIN SODIUM (PORCINE) 1000 UNIT/ML IJ SOLN
INTRAMUSCULAR | Status: AC
  Filled 2020-02-09: qty 1

## 2020-02-09 MED ORDER — EPHEDRINE SULFATE-NACL 50-0.9 MG/10ML-% IV SOSY
PREFILLED_SYRINGE | INTRAVENOUS | Status: DC | PRN
  Administered 2020-02-09: 10 mg via INTRAVENOUS
  Administered 2020-02-09: 15 mg via INTRAVENOUS

## 2020-02-09 MED ORDER — PROPOFOL 1000 MG/100ML IV EMUL
INTRAVENOUS | Status: AC
  Filled 2020-02-09: qty 100

## 2020-02-09 MED ORDER — ASPIRIN EC 325 MG PO TBEC
325.0000 mg | DELAYED_RELEASE_TABLET | Freq: Every day | ORAL | Status: DC
  Administered 2020-02-10: 325 mg via ORAL
  Filled 2020-02-09: qty 1

## 2020-02-09 MED ORDER — GUAIFENESIN-DM 100-10 MG/5ML PO SYRP: 15.0000 mL | ORAL_SOLUTION | ORAL | Status: DC | PRN

## 2020-02-09 MED ORDER — ROCURONIUM BROMIDE 10 MG/ML (PF) SYRINGE
PREFILLED_SYRINGE | INTRAVENOUS | Status: AC
  Filled 2020-02-09: qty 10

## 2020-02-09 MED ORDER — HYDRALAZINE HCL 20 MG/ML IJ SOLN: 5.0000 mg | INTRAMUSCULAR | Status: DC | PRN

## 2020-02-09 MED ORDER — PROTAMINE SULFATE 10 MG/ML IV SOLN
INTRAVENOUS | Status: DC | PRN
  Administered 2020-02-09: 150 mg via INTRAVENOUS

## 2020-02-09 MED ORDER — PHENOL 1.4 % MT LIQD: 1.0000 | OROMUCOSAL | Status: DC | PRN

## 2020-02-09 MED ORDER — CLOPIDOGREL BISULFATE 75 MG PO TABS
75.0000 mg | ORAL_TABLET | Freq: Every day | ORAL | Status: DC
  Administered 2020-02-10: 75 mg via ORAL
  Filled 2020-02-09: qty 1

## 2020-02-09 MED ORDER — DOCUSATE SODIUM 100 MG PO CAPS
100.0000 mg | ORAL_CAPSULE | Freq: Every day | ORAL | Status: DC
  Administered 2020-02-10: 09:00:00 100 mg via ORAL
  Filled 2020-02-09: qty 1

## 2020-02-09 MED ORDER — LABETALOL HCL 5 MG/ML IV SOLN
10.0000 mg | INTRAVENOUS | Status: DC | PRN
  Administered 2020-02-09: 15:00:00 10 mg via INTRAVENOUS

## 2020-02-09 MED ORDER — PHENYLEPHRINE HCL-NACL 10-0.9 MG/250ML-% IV SOLN
INTRAVENOUS | Status: DC | PRN
  Administered 2020-02-09: 40 ug/min via INTRAVENOUS

## 2020-02-09 MED ORDER — METOPROLOL TARTRATE 5 MG/5ML IV SOLN
INTRAVENOUS | Status: AC
  Filled 2020-02-09: qty 5

## 2020-02-09 MED ORDER — FENTANYL CITRATE (PF) 100 MCG/2ML IJ SOLN: 25.0000 ug | INTRAMUSCULAR | Status: DC | PRN

## 2020-02-09 MED ORDER — EZETIMIBE 10 MG PO TABS
10.0000 mg | ORAL_TABLET | Freq: Every day | ORAL | Status: DC
  Administered 2020-02-10: 10 mg via ORAL
  Filled 2020-02-09: qty 1

## 2020-02-09 MED ORDER — HEPARIN SODIUM (PORCINE) 1000 UNIT/ML IJ SOLN
INTRAMUSCULAR | Status: DC | PRN
  Administered 2020-02-09 (×2): 15000 [IU] via INTRAVENOUS

## 2020-02-09 MED ORDER — DEXAMETHASONE SODIUM PHOSPHATE 10 MG/ML IJ SOLN
INTRAMUSCULAR | Status: AC
  Filled 2020-02-09: qty 1

## 2020-02-09 MED ORDER — LIDOCAINE 2% (20 MG/ML) 5 ML SYRINGE
INTRAMUSCULAR | Status: AC
  Filled 2020-02-09: qty 5

## 2020-02-09 MED ORDER — LIDOCAINE HCL (PF) 1 % IJ SOLN
INTRAMUSCULAR | Status: AC
  Filled 2020-02-09: qty 5

## 2020-02-09 MED ORDER — ALBUMIN HUMAN 5 % IV SOLN: INTRAVENOUS | Status: DC | PRN

## 2020-02-09 MED ORDER — PHENYLEPHRINE 40 MCG/ML (10ML) SYRINGE FOR IV PUSH (FOR BLOOD PRESSURE SUPPORT)
PREFILLED_SYRINGE | INTRAVENOUS | Status: DC | PRN
  Administered 2020-02-09: 120 ug via INTRAVENOUS

## 2020-02-09 MED ORDER — ESMOLOL HCL 100 MG/10ML IV SOLN
INTRAVENOUS | Status: AC
  Filled 2020-02-09: qty 20

## 2020-02-09 SURGICAL SUPPLY — 49 items
ADH SKN CLS APL DERMABOND .7 (GAUZE/BANDAGES/DRESSINGS) ×1
AGENT HMST SPONGE THK3/8 (HEMOSTASIS)
CANISTER SUCT 3000ML PPV (MISCELLANEOUS) ×2 IMPLANT
CATH ROBINSON RED A/P 18FR (CATHETERS) ×1 IMPLANT
CLIP VESOCCLUDE MED 24/CT (CLIP) ×2 IMPLANT
CLIP VESOCCLUDE SM WIDE 24/CT (CLIP) ×2 IMPLANT
DECANTER SPIKE VIAL GLASS SM (MISCELLANEOUS) IMPLANT
DERMABOND ADVANCED (GAUZE/BANDAGES/DRESSINGS) ×1
DERMABOND ADVANCED .7 DNX12 (GAUZE/BANDAGES/DRESSINGS) ×1 IMPLANT
DRAIN HEMOVAC 1/8 X 5 (WOUND CARE) IMPLANT
ELECT REM PT RETURN 9FT ADLT (ELECTROSURGICAL) ×2
ELECTRODE REM PT RTRN 9FT ADLT (ELECTROSURGICAL) ×1 IMPLANT
EVACUATOR SILICONE 100CC (DRAIN) IMPLANT
GAUZE SPONGE 4X4 12PLY STRL (GAUZE/BANDAGES/DRESSINGS) ×2 IMPLANT
GLOVE BIO SURGEON STRL SZ7 (GLOVE) ×1 IMPLANT
GLOVE BIO SURGEON STRL SZ7.5 (GLOVE) ×4 IMPLANT
GLOVE BIOGEL PI IND STRL 7.0 (GLOVE) IMPLANT
GLOVE BIOGEL PI INDICATOR 7.0 (GLOVE) ×3
GLOVE SURG SS PI 7.5 STRL IVOR (GLOVE) ×1 IMPLANT
GOWN STRL REUS W/ TWL LRG LVL3 (GOWN DISPOSABLE) ×3 IMPLANT
GOWN STRL REUS W/ TWL XL LVL3 (GOWN DISPOSABLE) IMPLANT
GOWN STRL REUS W/TWL 2XL LVL3 (GOWN DISPOSABLE) ×1 IMPLANT
GOWN STRL REUS W/TWL LRG LVL3 (GOWN DISPOSABLE) ×8
GOWN STRL REUS W/TWL XL LVL3 (GOWN DISPOSABLE) ×4
GRAFT HEMASHIELD 8MM (Vascular Products) ×2 IMPLANT
GRAFT VASC STRG 30X8KNIT (Vascular Products) IMPLANT
HEMOSTAT SPONGE AVITENE ULTRA (HEMOSTASIS) IMPLANT
INSERT FOGARTY SM (MISCELLANEOUS) IMPLANT
KIT BASIN OR (CUSTOM PROCEDURE TRAY) ×2 IMPLANT
KIT TURNOVER KIT B (KITS) ×2 IMPLANT
LOOP VESSEL MINI RED (MISCELLANEOUS) IMPLANT
NEEDLE 22X1 1/2 (OR ONLY) (NEEDLE) IMPLANT
NS IRRIG 1000ML POUR BTL (IV SOLUTION) ×4 IMPLANT
PACK CAROTID (CUSTOM PROCEDURE TRAY) ×2 IMPLANT
PAD ARMBOARD 7.5X6 YLW CONV (MISCELLANEOUS) ×4 IMPLANT
POSITIONER HEAD DONUT 9IN (MISCELLANEOUS) ×2 IMPLANT
PUNCH AORTIC ROTATE 5MM 8IN (MISCELLANEOUS) ×1 IMPLANT
SHUNT CAROTID BYPASS 10 (VASCULAR PRODUCTS) IMPLANT
SUT PROLENE 6 0 C 1 24 (SUTURE) ×6 IMPLANT
SUT PROLENE 6 0 CC (SUTURE) ×6 IMPLANT
SUT PROLENE 7 0 BV 1 (SUTURE) ×1 IMPLANT
SUT VIC AB 3-0 SH 27 (SUTURE) ×4
SUT VIC AB 3-0 SH 27X BRD (SUTURE) ×1 IMPLANT
SUT VICRYL 4-0 PS2 18IN ABS (SUTURE) ×2 IMPLANT
SYR CONTROL 10ML LL (SYRINGE) IMPLANT
TOWEL GREEN STERILE (TOWEL DISPOSABLE) ×2 IMPLANT
TOWEL GREEN STERILE FF (TOWEL DISPOSABLE) ×1 IMPLANT
TRAY FOLEY MTR SLVR 16FR STAT (SET/KITS/TRAYS/PACK) ×2 IMPLANT
WATER STERILE IRR 1000ML POUR (IV SOLUTION) ×2 IMPLANT

## 2020-02-09 NOTE — Progress Notes (Signed)
  Progress Note    02/09/2020 4:32 PM Day of Surgery  Subjective: Complaining of bilateral upper extremity stiffness. Also having some numbness of left 2nd-5th fingers. Minimal soreness of surgical site   Vitals:   02/09/20 1440 02/09/20 1459  BP: (!) 152/85 (!) 152/87  Pulse:  96  Resp: 20 20  Temp: (!) 97.5 F (36.4 C) 98.4 F (36.9 C)  SpO2:  91%   Physical Exam: General: obese, well appearing, looks comfortable Cardiac:  regular Lungs:  Non labored Incisions:  Left neck incision c/d/i. No swelling or hematoma Extremities:  Moving all extremities. 2+ radial pulses bilaterally, 3/5 left upper extremity grip strength secondary to numbness, 5/5 right grip strength, bilateral hands warm. Bilateral lower extremities motor and sensory intact. 2+ dorsalis pedis pulses Abdomen:  Obese, soft nontender Neurologic: alert and oriented, speech slow but coherent, face symmetrical, no upper or lower extremity weakness  CBC    Component Value Date/Time   WBC 9.4 02/04/2020 1038   RBC 5.22 02/04/2020 1038   HGB 16.1 02/04/2020 1038   HGB 16.5 10/31/2019 1432   HCT 47.5 02/04/2020 1038   HCT 47.5 10/31/2019 1432   PLT 184 02/04/2020 1038   PLT 172 10/31/2019 1432   MCV 91.0 02/04/2020 1038   MCV 92 10/31/2019 1432   MCH 30.8 02/04/2020 1038   MCHC 33.9 02/04/2020 1038   RDW 13.2 02/04/2020 1038   RDW 13.5 10/31/2019 1432   LYMPHSABS 4.3 (H) 12/15/2019 0357   LYMPHSABS 3.5 (H) 10/31/2019 1432   MONOABS 0.6 12/15/2019 0357   EOSABS 0.4 12/15/2019 0357   EOSABS 0.4 10/31/2019 1432   BASOSABS 0.2 (H) 12/15/2019 0357   BASOSABS 0.1 10/31/2019 1432    BMET    Component Value Date/Time   NA 139 02/04/2020 1038   NA 143 10/31/2019 1432   K 3.5 02/04/2020 1038   CL 104 02/04/2020 1038   CO2 27 02/04/2020 1038   GLUCOSE 163 (H) 02/04/2020 1038   BUN 9 02/04/2020 1038   BUN 16 10/31/2019 1432   CREATININE 0.81 02/04/2020 1038   CREATININE 0.92 06/19/2017 1004   CALCIUM 9.3  02/04/2020 1038   GFRNONAA >60 02/04/2020 1038   GFRAA >60 02/04/2020 1038    INR    Component Value Date/Time   INR 0.9 02/04/2020 1038     Intake/Output Summary (Last 24 hours) at 02/09/2020 1632 Last data filed at 02/09/2020 1426 Gross per 24 hour  Intake 2100 ml  Output 300 ml  Net 1800 ml     Assessment/Plan:  49 y.o. male is s/p left carotid subclavian bypass Day of Surgery. Doing well post op. Neurologically intact. Some left hand numbness 2nd-5th fingers.Upper and lower extremities well perfused. Patient tolerating diet. Pain well controlled. Increase mobility. Likely will be okay for discharge tomorrow if he does well overnight     Karoline Caldwell, [facility] [phone] 02/09/2020 4:32 PM

## 2020-02-09 NOTE — Op Note (Signed)
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  Vagus nerve lies posterior to the graft   3,  phrenic nerves lies to the posterolateral side of the graft  Operative details: After team informed consent, the patient taken the operating.  The patient placed supine position operating table.  After induction general esthesia endotracheal intubation Foley catheter was placed.  Next patient's entire left neck and chest were prepped and draped in usual sterile fashion.  An oblique incision was made just above the left collarbone carried down through subcutaneous tissues down the level of the lateral head of the sternocleidomastoid muscle.  Platysma was incised with full length incision.  Lateral head of the sternocleidomastoid muscle was divided with cautery.  Superior scalene fat pad was then mobilized on its medial and inferior aspects.  The anterior scalene muscle was identified with the phrenic nerve coursing over it the nerve was protected.  The scalene muscle was divided.  Due to the patient's body habitus it was fairly deep dissection.  I was able to dissect free the left subclavian artery and placed a vessel loop around this.  The thyrocervical trunk was ligated and divided tween silk ties.  The left internal mammary was dissected free circumferentially and ligated and divided between silk ties.  Tibial artery was identified but a vessel loop was not placed around this.  There was a branch that was a sending just lateral to this and a vessel loop was placed around this.  Review of the patient's CT angiogram showed 2 vessels in this area that were overlapping it was difficult to discern which ones the vertebral most likely this was the more proximal branch but I left both of these in the circuit.  Next the  common carotid artery was dissected free in the medial portion incision.  Internal jugular vein was reflected to provide posterior exposure of the common carotid artery.  The vagus nerve was identified and protected.  Common carotid artery was dissected free circumferentially Vesely placed around this.  The common carotid artery and subclavian arteries were both soft on palpation.  Patient was given 15,000 units of intravenous heparin.  He was given an additional 15,000 units of heparin during the course of the case.  Subclavian artery was controlled just distal to the vertebral artery with a Henley clamp and also distally with a Henley clamp.  A longitude opening was made in the artery and this was widened with a 5 mm punch.  8 mm dacron graft was then brought up in the operative field and sewn endograft to side of artery using a running 6-0 Prolene suture.  The distal anastomosis was fairly challenging due to the patient's large body habitus and the depth of the wound.  After completion of the anastomosis it was for blood backbled and thoroughly flushed.  There was 2 areas of leak 1 on the posterior wall 1 in the anterior wall these were repaired with single figure-of-eight Prolene sutures.  Graft was then clamped just above its origin.  Its orientation gave a good lay to the common carotid artery with the vagus nerve posterior to it and the phrenic nerve just lateral and slightly posterior to the graft.  Common carotid artery was controlled proximally distally with Henley clamps.  Longitudinal opening was made in the artery and this was extended with  Potts scissors.  It was widened with a 5 mm punch.  Graft was then cut to length and slightly beveled and sewn end of graft to side of artery using a running 6-0 Prolene suture.  Just prior to completion anastomosis it was for blood backbled and thoroughly flushed anastomosis was secured the distal subclavian clamp was removed on allowed to backbleed.  There were a  couple of areas that needed repair sutures and these were performed.  Flow was then restored from the proximal common carotid to the subclavian initially.  I then restored flow to the distal common carotid artery.  Hemostasis was obtained with direct pressure and 150 mg of protamine.  The scalene fat pad was returned to its position.  It should be noted that the graft lays anterior to the vagus nerve posterior to the left internal jugular vein and the phrenic nerve is posterior slightly lateral to the graft.  Platysma muscle was reapproximated using running 3-0 Vicryl suture.  Skin was closed with 4-0 Vicryl subcuticular stitch.  Dermabond was applied.  The patient tolerated seizure well and there were no complications.  Incident sponge needle count was correct in the case.  Patient was taken to recovery in stable condition.  Ruta Hinds, MD [facility] Office: [phone]

## 2020-02-09 NOTE — Interval H&P Note (Signed)
History and Physical Interval Note:  02/09/2020 7:23 AM  Brandon [name]  has presented today for surgery, with the diagnosis of OCCLUSION OF LEFT SUBCLAVIAN ARTERY.  The various methods of treatment have been discussed with the patient and family. After consideration of risks, benefits and other options for treatment, the patient has consented to  Procedure(s): BYPASS GRAFT CAROTID-SUBCLAVIAN (Left) as a surgical intervention.  The patient's history has been reviewed, patient examined, no change in status, stable for surgery.  I have reviewed the patient's chart and labs.  Questions were answered to the patient's satisfaction.     Ruta Hinds

## 2020-02-09 NOTE — Anesthesia Procedure Notes (Signed)
Arterial Line Insertion Start/End4/19/2021 7:05 AM, 02/09/2020 7:12 AM Performed by: Milford Cage, CRNA  Patient location: Pre-op. Preanesthetic checklist: patient identified, IV checked, site marked, risks and benefits discussed, surgical consent, monitors and equipment checked, pre-op evaluation, timeout performed and anesthesia consent Lidocaine 1% used for infiltration Right, radial was placed Catheter size: 20 G Hand hygiene performed  and Seldinger technique used  Attempts: 1 Procedure performed without using ultrasound guided technique. Following insertion, dressing applied and Biopatch. Post procedure assessment: normal and unchanged  Patient tolerated the procedure well with no immediate complications.

## 2020-02-09 NOTE — Transfer of Care (Signed)
Immediate Anesthesia Transfer of Care Note  Patient: Brandon [name]  Procedure(s) Performed: BYPASS GRAFT CAROTID-SUBCLAVIAN USING HEMASHIELD GOLD GRAFT (Left )  Patient Location: PACU  Anesthesia Type:General  Level of Consciousness: awake  Airway & Oxygen Therapy: Patient Spontanous Breathing and Patient connected to face mask oxygen  Post-op Assessment: Report given to RN and Post -op Vital signs reviewed and stable  Post vital signs: Reviewed and stable  Last Vitals:  Vitals Value Taken Time  BP 148/94 02/09/20 1245  Temp    Pulse 96 02/09/20 1248  Resp 21 02/09/20 1248  SpO2 93 % 02/09/20 1248  Vitals shown include unvalidated device data.  Last Pain:  Vitals:   02/09/20 0600  TempSrc:   PainSc: 0-No pain      Patients Stated Pain Goal: 3 ([id number])  Complications: No apparent anesthesia complications

## 2020-02-09 NOTE — Anesthesia Postprocedure Evaluation (Signed)
Anesthesia Post Note  Patient: Brandon [name]  Procedure(s) Performed: BYPASS GRAFT CAROTID-SUBCLAVIAN USING HEMASHIELD GOLD GRAFT (Left )     Patient location during evaluation: PACU Anesthesia Type: General Level of consciousness: awake and alert Pain management: pain level controlled Vital Signs Assessment: post-procedure vital signs reviewed and stable Respiratory status: spontaneous breathing, nonlabored ventilation, respiratory function stable and patient connected to nasal cannula oxygen Cardiovascular status: blood pressure returned to baseline and stable Postop Assessment: no apparent nausea or vomiting Anesthetic complications: no    Last Vitals:  Vitals:   02/09/20 1415 02/09/20 1430  BP: (!) 157/97 (!) 171/94  Pulse: 95 95  Resp: 20 20  Temp:    SpO2: 95% 95%    Last Pain:  Vitals:   02/09/20 1430  TempSrc:   PainSc: 0-No pain                 Audry Pili

## 2020-02-09 NOTE — Anesthesia Procedure Notes (Signed)
Procedure Name: Intubation Performed by: Milford Cage, CRNA Pre-anesthesia Checklist: Patient identified, Emergency Drugs available, Suction available and Patient being monitored Patient Re-evaluated:Patient Re-evaluated prior to induction Oxygen Delivery Method: Circle System Utilized Preoxygenation: Pre-oxygenation with 100% oxygen Induction Type: IV induction Ventilation: Mask ventilation with difficulty, Oral airway inserted - appropriate to patient size and Two handed mask ventilation required Laryngoscope Size: Glidescope and 4 Grade View: Grade I Tube type: Oral Tube size: 7.5 mm Number of attempts: 1 Airway Equipment and Method: Stylet and Oral airway Placement Confirmation: ETT inserted through vocal cords under direct vision,  positive ETCO2 and breath sounds checked- equal and bilateral Secured at: 24 cm Tube secured with: Tape Dental Injury: Teeth and Oropharynx as per pre-operative assessment  Difficulty Due To: Difficulty was anticipated, Difficult Airway- due to large tongue and Difficult Airway- due to reduced neck mobility Future Recommendations: Recommend- induction with short-acting agent, and alternative techniques readily available Comments: Recommend Glidescope in future. Difficult 2 handed mask.

## 2020-02-09 NOTE — Progress Notes (Signed)
02/09/2020 1500 Received pt to room 4E-14 from PACU.  Pt is A&O, C/O of Bilteral arm pain and C/O L arm numbness which was reported by PACU nurse and informed that Dr. Oneida Alar is aware of this.  Otherwise all Neuro is intact.  Tele monitor applied and CCMD notified.  CHG bath given.  Otriented Pt to room, call light and bed.  Call bell in reach, wife at bedside. [place]

## 2020-02-10 ENCOUNTER — Encounter: Payer: Self-pay | Admitting: *Deleted

## 2020-02-10 LAB — BASIC METABOLIC PANEL
Anion gap: 12 (ref 5–15)
BUN: 12 mg/dL (ref 6–20)
CO2: 25 mmol/L (ref 22–32)
Calcium: 8.5 mg/dL — ABNORMAL LOW (ref 8.9–10.3)
Chloride: 102 mmol/L (ref 98–111)
Creatinine, Ser: 0.89 mg/dL (ref 0.61–1.24)
GFR calc Af Amer: >60 mL/min (ref >60)
GFR calc non Af Amer: >60 mL/min (ref >60)
Glucose, Bld: 187 mg/dL — ABNORMAL HIGH (ref 70–99)
Potassium: 4.1 mmol/L (ref 3.5–5.1)
Sodium: 139 mmol/L (ref 135–145)

## 2020-02-10 LAB — CBC
HCT: 42.5 % (ref 39.0–52.0)
Hemoglobin: 14.3 g/dL (ref 13.0–17.0)
MCH: 31.4 pg (ref 26.0–34.0)
MCHC: 33.6 g/dL (ref 30.0–36.0)
MCV: 93.2 fL (ref 80.0–100.0)
Platelets: 168 10*3/uL (ref 150–400)
RBC: 4.56 MIL/uL (ref 4.22–5.81)
RDW: 13.2 % (ref 11.5–15.5)
WBC: 15.5 10*3/uL — ABNORMAL HIGH (ref 4.0–10.5)
nRBC: 0 % (ref 0.0–0.2)

## 2020-02-10 MED ORDER — OXYCODONE-ACETAMINOPHEN 5-325 MG PO TABS: 1.0000 | ORAL_TABLET | Freq: Four times a day (QID) | ORAL | 0 refills | Status: DC | PRN

## 2020-02-10 NOTE — Progress Notes (Signed)
Discharge instructions given to Brandon [name].  Discussed medication changes, new medications and side effects.  Discussed signs and symptoms to watch for and when to contact the physician.  Discussed follow up appointments and activities.  Verbalized understanding.

## 2020-02-10 NOTE — Discharge Instructions (Signed)
   [facility]  Discharge Instructions   Carotid Endarterectomy (CEA)  Please refer to the following instructions for your post-procedure care. Your surgeon or physician assistant will discuss any changes with you.  Activity  You are encouraged to walk as much as you can. You can slowly return to normal activities but must avoid strenuous activity and heavy lifting until your doctor tell you it's OK. Avoid activities such as vacuuming or swinging a golf club. You can drive after one week if you are comfortable and you are no longer taking prescription pain medications. It is normal to feel tired for serval weeks after your surgery. It is also normal to have difficulty with sleep habits, eating, and bowel movements after surgery. These will go away with time.  Bathing/Showering  You may shower after you come home. Do not soak in a bathtub, hot tub, or swim until the incision heals completely.  Incision Care  Shower every day. Clean your incision with mild soap and water. Pat the area dry with a clean towel. You do not need a bandage unless otherwise instructed. Do not apply any ointments or creams to your incision. You may have skin glue on your incision. Do not peel it off. It will come off on its own in about one week. Your incision may feel thickened and raised for several weeks after your surgery. This is normal and the skin will soften over time. For Men Only: It's OK to shave around the incision but do not shave the incision itself for 2 weeks. It is common to have numbness under your chin that could last for several months.  Diet  Resume your normal diet. There are no special food restrictions following this procedure. A low fat/low cholesterol diet is recommended for all patients with vascular disease. In order to heal from your surgery, it is CRITICAL to get adequate nutrition. Your body requires vitamins, minerals, and protein. Vegetables are the best  source of vitamins and minerals. Vegetables also provide the perfect balance of protein. Processed food has little nutritional value, so try to avoid this.        Medications  Resume taking all of your medications unless your doctor or physician assistant tells you not to. If your incision is causing pain, you may take over-the- counter pain relievers such as acetaminophen (Tylenol). If you were prescribed a stronger pain medication, please be aware these medications can cause nausea and constipation. Prevent nausea by taking the medication with a snack or meal. Avoid constipation by drinking plenty of fluids and eating foods with a high amount of fiber, such as fruits, vegetables, and grains. Do not take Tylenol if you are taking prescription pain medications.  Follow Up  Our office will schedule a follow up appointment 2-3 weeks following discharge.  Please call us immediately for any of the following conditions  Increased pain, redness, drainage (pus) from your incision site. Fever of 101 degrees or higher. If you should develop stroke (slurred speech, difficulty swallowing, weakness on one side of your body, loss of vision) you should call 911 and go to the nearest emergency room.  Reduce your risk of vascular disease:  Stop smoking. If you would like help call [facility] at 1-800-QUIT-NOW (1-800-784-8669) or [place] at 336-586-4000. Manage your cholesterol Maintain a desired weight Control your diabetes Keep your blood pressure down  If you have any questions, please call the office at [phone].   

## 2020-02-10 NOTE — Progress Notes (Addendum)
  Progress Note    02/10/2020 7:13 AM 1 Day Post-Op  Subjective:  Wearing oxygen this morning but patient states he has not been SOB.  Numbness L arm from elbow to hand.  Denies stroke like symptoms including slurring speech, changes in vision, or one sided weakness.   Vitals:   02/10/20 0600 02/10/20 0649  BP:  (!) 153/97  Pulse:  (!) 113  Resp:  (!) 23  Temp:    SpO2: 94% 94%   Physical Exam: Lungs:  Non labored Incisions:  L chest incision c/d/i Extremities:  Palpable L radial pulse Abdomen:  soft Neurologic: A&O; CN grossly intact  CBC    Component Value Date/Time   WBC 15.5 (H) 02/10/2020 0358   RBC 4.56 02/10/2020 0358   HGB 14.3 02/10/2020 0358   HGB 16.5 10/31/2019 1432   HCT 42.5 02/10/2020 0358   HCT 47.5 10/31/2019 1432   PLT 168 02/10/2020 0358   PLT 172 10/31/2019 1432   MCV 93.2 02/10/2020 0358   MCV 92 10/31/2019 1432   MCH 31.4 02/10/2020 0358   MCHC 33.6 02/10/2020 0358   RDW 13.2 02/10/2020 0358   RDW 13.5 10/31/2019 1432   LYMPHSABS 4.3 (H) 12/15/2019 0357   LYMPHSABS 3.5 (H) 10/31/2019 1432   MONOABS 0.6 12/15/2019 0357   EOSABS 0.4 12/15/2019 0357   EOSABS 0.4 10/31/2019 1432   BASOSABS 0.2 (H) 12/15/2019 0357   BASOSABS 0.1 10/31/2019 1432    BMET    Component Value Date/Time   NA 139 02/10/2020 0358   NA 143 10/31/2019 1432   K 4.1 02/10/2020 0358   CL 102 02/10/2020 0358   CO2 25 02/10/2020 0358   GLUCOSE 187 (H) 02/10/2020 0358   BUN 12 02/10/2020 0358   BUN 16 10/31/2019 1432   CREATININE 0.89 02/10/2020 0358   CREATININE 0.92 06/19/2017 1004   CALCIUM 8.5 (L) 02/10/2020 0358   GFRNONAA >60 02/10/2020 0358   GFRAA >60 02/10/2020 0358    INR    Component Value Date/Time   INR 0.9 02/04/2020 1038     Intake/Output Summary (Last 24 hours) at 02/10/2020 0713 Last data filed at 02/10/2020 [id number] Gross per 24 hour  Intake 2650 ml  Output 1750 ml  Net 900 ml     Assessment/Plan:  49 y.o. male is s/p L carotid  subclavian bypass 1 Day Post-Op   L arm well perfused with palpable radial pulse D/c oxygen by [facility] this morning Numbness L arm; likely due to proximity of brachial plexus to surgical site; this should improve with time Likely home today   Dagoberto Ligas, PA-C [facility] [phone] 02/10/2020 7:13 AM  Agree with above.  Some numbness in left arm probably some neuropraxia due to to retraction.  This should resolve in a few weeks.  Chest xray yesterday no pneumo no elevation of diaphragm  2+ radial pulse No hematoma  D/c home  Ruta Hinds, MD [facility] Office: [phone]

## 2020-02-10 NOTE — Plan of Care (Signed)

## 2020-02-11 ENCOUNTER — Inpatient Hospital Stay: Payer: 59 | Admitting: Neurology

## 2020-02-11 NOTE — Discharge Summary (Signed)
  Discharge Summary  Patient ID: Brandon [name] [id number] 49 y.o. 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 is a 49 year old male who was brought in as an outpatient for left carotid to subclavian bypass due to symptomatic left subclavian artery occlusion.  He tolerated the procedure well and was admitted to the hospital postoperatively.  Chest x-ray was obtained and recovery room which was negative for pneumothorax and elevation of left hemidiaphragm.  POD #1 patient did have some left arm numbness from elbow to hand however patient was assured this is likely due to retraction of brachial plexus during surgery.  At the time of discharge she had a palpable 2+ left radial pulse.  He will follow-up with Dr. Oneida Alar in 2 to 3 weeks.  He will be prescribed 2 to 3 days of narcotic pain medication for continued postoperative pain control.  He was discharged home in stable condition.  Consults: None  Treatments: surgery: Left carotid subclavian bypass by Dr. Oneida Alar on 02/09/2020  Discharge Exam: See progress note 02/10/2020 Vitals:   02/10/20 0649 02/10/20 0824  BP: (!) 153/97 (!) 142/74  Pulse: (!) 113 (!) 103  Resp: (!) 23 18  Temp:  98.9 F (37.2 C)  SpO2: 94% 92%     Disposition: Discharge disposition: 01-Home or Self Care       Patient Instructions:  Allergies as of 02/10/2020   No Known Allergies     Medication List    TAKE these medications   aspirin 325 MG EC tablet Take 1 tablet (325 mg total) by mouth daily.   atorvastatin 80 MG tablet Commonly known as:  LIPITOR Take 1 tablet (80 mg total) by mouth daily.   clopidogrel 75 MG tablet Commonly known as: PLAVIX Take 1 tablet (75 mg total) by mouth daily.   ezetimibe 10 MG tablet Commonly known as: ZETIA Take 1 tablet (10 mg total) by mouth daily.   oxyCODONE-acetaminophen 5-325 MG tablet Commonly known as: PERCOCET/ROXICET Take 1 tablet by mouth every 6 (six) hours as needed for moderate pain.      Activity: activity as tolerated Diet: regular diet Wound Care: keep wound clean and dry  Follow-up with Dr. Oneida Alar in 3 weeks.  Signed: Dagoberto Ligas, PA-C 02/11/2020 9:33 AM [facility] Office: [phone]

## 2020-02-12 ENCOUNTER — Other Ambulatory Visit: Payer: Self-pay | Admitting: Physician Assistant

## 2020-02-12 ENCOUNTER — Other Ambulatory Visit: Payer: Self-pay | Admitting: *Deleted

## 2020-02-12 ENCOUNTER — Ambulatory Visit
Admission: RE | Admit: 2020-02-12 | Discharge: 2020-02-12 | Disposition: A | Discharge status: Home / Self Care | Payer: Self-pay | Source: Ambulatory Visit | Attending: Physician Assistant | Admitting: Physician Assistant

## 2020-02-12 ENCOUNTER — Other Ambulatory Visit: Payer: Self-pay

## 2020-02-12 ENCOUNTER — Encounter: Payer: Self-pay | Admitting: Vascular Surgery

## 2020-02-12 ENCOUNTER — Ambulatory Visit (INDEPENDENT_AMBULATORY_CARE_PROVIDER_SITE_OTHER): Payer: Self-pay | Admitting: Vascular Surgery

## 2020-02-12 ENCOUNTER — Telehealth: Payer: Self-pay | Admitting: *Deleted

## 2020-02-12 VITALS — BP 135/87 | HR 90 | Temp 98.3°F | Resp 20 | Ht 69.0 in | Wt 306.0 lb

## 2020-02-12 DIAGNOSIS — I708 Atherosclerosis of other arteries: Secondary | ICD-10-CM

## 2020-02-12 DIAGNOSIS — R0602 Shortness of breath: Secondary | ICD-10-CM

## 2020-02-12 MED ORDER — FUROSEMIDE 40 MG PO TABS: 40.0000 mg | ORAL_TABLET | Freq: Every day | ORAL | 0 refills | Status: DC

## 2020-02-12 MED ORDER — POTASSIUM CHLORIDE ER 10 MEQ PO CPCR: 20.0000 meq | ORAL_CAPSULE | Freq: Two times a day (BID) | ORAL | 0 refills | Status: DC

## 2020-02-12 NOTE — Telephone Encounter (Signed)
Patient's wife called she states patient is having shortness of breath when trying to lay down he has been unable to sleep since he cant lay flat. He denies any shortness of breath at rest or with ambulation. He denies any swelling at surgical site and states incision looks good. He denies any chest pain. Spoke with Dr Oneida Alar he gave verbal order for Chest xray PA/LAT with inspiration and expiration and patient to be seen this afternoon in office. Informed patients wife she will take him this am for chest xray at [facility] and will bring him for appt today at 2:00.

## 2020-02-12 NOTE — Progress Notes (Signed)
Patient is a 49 year old male who returns for postoperative follow-up today.  He is postoperative day #3.  He recently underwent left carotid subclavian bypass.  Since he was discharged from the hospital he has been having some shortness of breath when he lays completely flat or on an incline.  He does not have any chest pain.  Of note he does have a fairly large abdomen.  However, he states this is certainly different from preoperatively.  When he is standing up he does not have shortness of breath.  He has not had any hemoptysis.  He has not had any asymmetric swelling of his lower extremities or noted really any swelling in his extremities at all.  The numbness and tingling he had in his left arm is resolving.  Data: He had a chest x-ray today with inspiration and expiration to make sure that he had good diaphragm function since we were around his phrenic nerve.  His inspiration and expiration films show good motion of the diaphragm and it is symmetric.  He does have some evidence of mild to moderate pulmonary edema with small pleural effusions bilaterally.  Physical exam:  Vitals:   02/12/20 1359  BP: 135/87  Pulse: 90  Resp: 20  Temp: 98.3 F (36.8 C)  TempSrc: Temporal  SpO2: 94%  Weight: (!) 306 lb (138.8 kg)  Height: 5\' 9"  (1.753 m)    Extremities: 2+ left radial pulse healing left neck incision Respiratory: No significant work of breathing skin is pink and warm good oxygen saturations noted  Neuro: Symmetric upper extremity lower extremity motor strength again still some residual numbness along the ulnar aspect of his left arm but this is resolving  Assessment: Shortness of breath most likely secondary to mild hypervolemia/cardiac congestion  Plan: Patient was given a prescription today for Lasix 40 mg once a day x3 days as well as potassium 40 mEq once a day x3 days  He has follow-up scheduled with me in the next couple weeks.  He will call me back if the shortness of breath  worsens or does not resolve in the next few days.  Ruta Hinds, MD [facility] Office: [phone]

## 2020-02-19 ENCOUNTER — Encounter (HOSPITAL_COMMUNITY): Payer: Self-pay | Admitting: Emergency Medicine

## 2020-02-19 ENCOUNTER — Telehealth: Payer: Self-pay

## 2020-02-19 ENCOUNTER — Emergency Department (HOSPITAL_COMMUNITY): Payer: Self-pay

## 2020-02-19 ENCOUNTER — Other Ambulatory Visit: Payer: Self-pay

## 2020-02-19 ENCOUNTER — Emergency Department (HOSPITAL_COMMUNITY)
Admission: EM | Admit: 2020-02-19 | Discharge: 2020-02-19 | Disposition: A | Discharge status: Home / Self Care | Payer: Self-pay | Attending: Emergency Medicine | Admitting: Emergency Medicine

## 2020-02-19 DIAGNOSIS — I2510 Atherosclerotic heart disease of native coronary artery without angina pectoris: Secondary | ICD-10-CM | POA: Insufficient documentation

## 2020-02-19 DIAGNOSIS — I739 Peripheral vascular disease, unspecified: Secondary | ICD-10-CM | POA: Insufficient documentation

## 2020-02-19 DIAGNOSIS — R0602 Shortness of breath: Secondary | ICD-10-CM | POA: Insufficient documentation

## 2020-02-19 DIAGNOSIS — I10 Essential (primary) hypertension: Secondary | ICD-10-CM | POA: Insufficient documentation

## 2020-02-19 DIAGNOSIS — Z79899 Other long term (current) drug therapy: Secondary | ICD-10-CM | POA: Insufficient documentation

## 2020-02-19 DIAGNOSIS — Z7901 Long term (current) use of anticoagulants: Secondary | ICD-10-CM | POA: Insufficient documentation

## 2020-02-19 DIAGNOSIS — E669 Obesity, unspecified: Secondary | ICD-10-CM | POA: Insufficient documentation

## 2020-02-19 DIAGNOSIS — I252 Old myocardial infarction: Secondary | ICD-10-CM | POA: Insufficient documentation

## 2020-02-19 DIAGNOSIS — F17210 Nicotine dependence, cigarettes, uncomplicated: Secondary | ICD-10-CM | POA: Insufficient documentation

## 2020-02-19 DIAGNOSIS — R600 Localized edema: Secondary | ICD-10-CM | POA: Insufficient documentation

## 2020-02-19 DIAGNOSIS — Z6841 Body Mass Index (BMI) 40.0 and over, adult: Secondary | ICD-10-CM | POA: Insufficient documentation

## 2020-02-19 DIAGNOSIS — Z7982 Long term (current) use of aspirin: Secondary | ICD-10-CM | POA: Insufficient documentation

## 2020-02-19 LAB — CBC
HCT: 40.9 % (ref 39.0–52.0)
Hemoglobin: 13.9 g/dL (ref 13.0–17.0)
MCH: 31.2 pg (ref 26.0–34.0)
MCHC: 34 g/dL (ref 30.0–36.0)
MCV: 91.7 fL (ref 80.0–100.0)
Platelets: 252 10*3/uL (ref 150–400)
RBC: 4.46 MIL/uL (ref 4.22–5.81)
RDW: 13.2 % (ref 11.5–15.5)
WBC: 10.5 10*3/uL (ref 4.0–10.5)
nRBC: 0 % (ref 0.0–0.2)

## 2020-02-19 LAB — BASIC METABOLIC PANEL
Anion gap: 12 (ref 5–15)
BUN: 9 mg/dL (ref 6–20)
CO2: 25 mmol/L (ref 22–32)
Calcium: 9 mg/dL (ref 8.9–10.3)
Chloride: 103 mmol/L (ref 98–111)
Creatinine, Ser: 0.89 mg/dL (ref 0.61–1.24)
GFR calc Af Amer: >60 mL/min (ref >60)
GFR calc non Af Amer: >60 mL/min (ref >60)
Glucose, Bld: 149 mg/dL — ABNORMAL HIGH (ref 70–99)
Potassium: 3.6 mmol/L (ref 3.5–5.1)
Sodium: 140 mmol/L (ref 135–145)

## 2020-02-19 LAB — TROPONIN I (HIGH SENSITIVITY)
Troponin I (High Sensitivity): 10 ng/L (ref <18)
Troponin I (High Sensitivity): 10 ng/L (ref <18)

## 2020-02-19 LAB — BRAIN NATRIURETIC PEPTIDE: B Natriuretic Peptide: 68.5 pg/mL (ref 0.0–100.0)

## 2020-02-19 MED ORDER — DOXYCYCLINE HYCLATE 100 MG PO CAPS: 100.0000 mg | ORAL_CAPSULE | Freq: Two times a day (BID) | ORAL | 0 refills | Status: AC

## 2020-02-19 MED ORDER — IOHEXOL 350 MG/ML SOLN
100.0000 mL | Freq: Once | INTRAVENOUS | Status: AC | PRN
  Administered 2020-02-19: 100 mL via INTRAVENOUS

## 2020-02-19 NOTE — Discharge Instructions (Signed)
You were seen in the ER for increased shortness of breath.  Your work-up today overall was reassuring.  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  You will need yearly CT scans to assess the size of it to prevent a possible dissection.  Please follow-up with your PCP about this.  Your CT scan also showed possible pneumonia, we will prescribe a antibiotic that you will need to take twice a day for 7 days.  Please make sure to finish the course of antibiotics to prevent resistance.  Please make sure to follow-up with your cardiologist within the week as well.  Return to the ER if your symptoms worsen.

## 2020-02-19 NOTE — ED Notes (Signed)
Patient verbalizes understanding of discharge instructions. Opportunity for questioning and answers were provided. Armband removed by staff, pt discharged from ED ambulatory.   

## 2020-02-19 NOTE — Telephone Encounter (Signed)
Ms. Schlieper reports Mr. Buehrer is still short of breath - worsening on Tuesday..The shortness of breath is worst when he lays down and best when he sits up in front of a fan.  He took Lasix and potassium as prescribed on 4/22, 4/23, and 4/24.  He did not go to the hospital on Friday as advised because, his wife reports, he felt "some better."  Message routed to [facility], [place]

## 2020-02-19 NOTE — ED Triage Notes (Signed)
Pt reports being sent by doc due to his SOB for a week. Recent bypass surgery. Denies CP. Pt had CXR on 4/22 that showed he had some fluid on his lungs.

## 2020-02-19 NOTE — Telephone Encounter (Signed)
Spoke with Dr Oneida Alar regarding patients shortness of breath - called pt and advised that they go to the ER for evaluation   York Cerise, CMA

## 2020-02-19 NOTE — ED Provider Notes (Signed)
[name] Provider Note   CSN: [id number] Arrival date & time: 02/19/20  1322     History Chief Complaint  Patient presents with  . Shortness of Breath    Brandon [name] is a 49 y.o. male.  HPI 49 year old male with a history of CAD with stent, PVD, hyperlipidemia, HTN, ASCVD, multiple MIs, subclavian artery stenosis/subclavian steal syndrome with recent subclavian bypass surgery on 02/09/2020 which was performed by Dr. Oneida Alar presents to the ER for increased shortness of breath since the surgery.  History provided by the patient.  Shortness of breath not worse or better with rest or exertion.  He does state that it is worse with laying down, improved with sitting up.  He also notes some numbness and mild weakness in his left arm but this is consistent with his recent surgery.  He states he is not normally this short of breath.  No swelling, discharge from surgical site, no fevers or chills.  Per chart review, patient called Dr. Oneida Alar office on 02/12/2020 and noted similar complaint, chest x-ray PA/LAT with inspiration expiration was done in the office that day which was normal.  He called the office again today with similar complaints and was told to come to the ER.  Patient also has a history of an acute CVA on 12/14/2019.  His last echo was done 12/15/2019 and showed an EF of 55 to 60%.  He states he "may have some kind of COPD" but does not have this listed on his problem list and does not appear to be taking any medications for this.  He is on Plavix and Lasix.  Past Medical History:  Diagnosis Date  . Arteriosclerotic cardiovascular disease (ASCVD) 2006, 2012   2006-acute IMI treated with urgent RCA stent; 2007-[place] for in-stent restenosis; 02/2010-presented with ACS and minimal troponin elevation:70% LAD, 80% distal circumflex, 80% proximal ramus branch vessel,in-stent restenosis of 70% in the RCA; BMS for proximal critical RCA stenosis,  restenosis Nov 2012  . Carotid artery occlusion   . Heart attack ([place])   . History of noncompliance with medical treatment    Due to financial considerations  . Hyperlipidemia   . Hypertension   . Stroke ([place])   . Tobacco abuse    40 pack years    Patient Active Problem List   Diagnosis Date Noted  . Subclavian artery stenosis ([place]) 02/09/2020  . 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  . 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 BPH (benign prostatic hyperplasia) 12/17/2019  . Internal carotid artery occlusion, left 12/15/2019  . Penile pain 03/21/2019  . Ureteral stone 03/21/2019  . Abdominal pain 01/23/2019  . Genital warts 01/23/2019  . Screen for colon cancer 01/23/2019  . Rectal abnormality 01/23/2019  . History of renal calculi 01/22/2019  . Left lower quadrant abdominal pain 01/22/2019  . Hematuria 01/22/2019  . Depression, major, in remission ([place]) 12/29/2012  . Sleep apnea 12/29/2012  . Arteriosclerotic cardiovascular disease (ASCVD)   . Tobacco abuse   . History of noncompliance with medical treatment   . Hyperlipidemia 05/30/2011  . Hypertension 05/30/2011  . Morbid obesity ([place]) 05/30/2011    Past Surgical History:  Procedure Laterality Date  . 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   Procedure: BYPASS GRAFT CAROTID-SUBCLAVIAN USING HEMASHIELD GOLD GRAFT;  Surgeon: Elam Dutch, MD;  Location: [facility];  Service: Vascular;  Laterality: Left;  . CORONARY ANGIOPLASTY WITH STENT PLACEMENT    . IR ANGIO EXTERNAL CAROTID SEL EXT  CAROTID BILAT MOD SED  12/15/2019  . IR ANGIO VERTEBRAL SEL VERTEBRAL UNI R MOD SED  12/15/2019  . IR ANGIOGRAM EXTREMITY LEFT  12/15/2019  . IR ANGIOGRAM EXTREMITY LEFT  12/15/2019  . IR CT HEAD LTD  12/15/2019  . IR PERCUTANEOUS ART THROMBECTOMY/INFUSION INTRACRANIAL INC DIAG ANGIO  12/15/2019  . RADIOLOGY WITH ANESTHESIA N/A 12/15/2019   Procedure: IR WITH ANESTHESIA;  Surgeon: Luanne Bras, MD;  Location: [facility];   Service: Radiology;  Laterality: N/A;       Family History  Problem Relation Age of Onset  . Depression Mother 46       Suicide  . Heart disease Father 49       Deceased from massive heart-attack  . Hyperlipidemia Father   . Hypertension Father   . COPD Maternal Grandfather   . Cancer Paternal Grandmother        Male Cancer  . Heart disease Paternal Grandmother     Social History   Tobacco Use  . Smoking status: Current Every Day Smoker    Packs/day: 0.75    Years: 27.00    Pack years: 20.25    Types: Cigarettes  . Smokeless tobacco: Never Used  Substance Use Topics  . Alcohol use: No    Comment:  Alcoholism- quit 2002  . Drug use: No    Home Medications Prior to Admission medications   Medication Sig Start Date End Date Taking? Authorizing Provider  aspirin EC 325 MG EC tablet Take 1 tablet (325 mg total) by mouth daily. 12/18/19 03/17/20 Yes Donzetta Starch, NP  atorvastatin (LIPITOR) 80 MG tablet Take 1 tablet (80 mg total) by mouth daily. 10/31/19  Yes Denita Lung, MD  clopidogrel (PLAVIX) 75 MG tablet Take 1 tablet (75 mg total) by mouth daily. 12/18/19  Yes Donzetta Starch, NP  ezetimibe (ZETIA) 10 MG tablet Take 1 tablet (10 mg total) by mouth daily. 01/02/20 04/01/20 Yes Hilty, Nadean Corwin, MD  furosemide (LASIX) 40 MG tablet Take 1 tablet (40 mg total) by mouth daily for 3 days. 02/12/20 02/19/20 Yes Fields, Jessy Oto, MD  oxyCODONE-acetaminophen (PERCOCET/ROXICET) 5-325 MG tablet Take 1 tablet by mouth every 6 (six) hours as needed for moderate pain. 02/10/20  Yes Dagoberto Ligas, PA-C  potassium chloride (MICRO-K) 10 MEQ CR capsule Take 2 capsules (20 mEq total) by mouth 2 (two) times daily for 3 days. 02/12/20 02/19/20 Yes Fields, Jessy Oto, MD  doxycycline (VIBRAMYCIN) 100 MG capsule Take 1 capsule (100 mg total) by mouth 2 (two) times daily for 7 days. 02/19/20 02/26/20  Garald Balding, PA-C    Allergies    Patient has no known allergies.  Review of Systems     Review of Systems  Constitutional: Positive for fatigue. Negative for chills and fever.  HENT: Negative for ear pain and sore throat.   Eyes: Negative for pain and visual disturbance.  Respiratory: Positive for shortness of breath. Negative for cough and choking.   Cardiovascular: Positive for chest pain. Negative for palpitations and leg swelling.  Gastrointestinal: Negative for abdominal pain, diarrhea, nausea and vomiting.  Genitourinary: Negative for dysuria and hematuria.  Musculoskeletal: Negative for arthralgias and back pain.  Skin: Negative for color change and rash.  Neurological: Positive for numbness. Negative for dizziness, seizures, syncope, weakness and headaches.  Psychiatric/Behavioral: Negative for confusion.  All other systems reviewed and are negative.   Physical Exam Updated Vital Signs BP 140/90   Pulse 70   Temp 98.4 F (  36.9 C) (Oral)   Resp 18   Ht 5\' 9"  (1.753 m)   Wt 136.1 kg   SpO2 96%   BMI 44.30 kg/m   Physical Exam Vitals and nursing note reviewed.  Constitutional:      General: He is not in acute distress.    Appearance: He is well-developed. He is obese. He is ill-appearing (Chronically ill-appearing). He is not toxic-appearing or diaphoretic.  HENT:     Head: Normocephalic and atraumatic.     Mouth/Throat:     Mouth: Mucous membranes are moist.     Pharynx: Oropharynx is clear.  Eyes:     Extraocular Movements: Extraocular movements intact.     Conjunctiva/sclera: Conjunctivae normal.     Pupils: Pupils are equal, round, and reactive to light.  Neck:     Vascular: No JVD.  Cardiovascular:     Rate and Rhythm: Normal rate and regular rhythm.     Pulses: No decreased pulses.     Heart sounds: No murmur.     Comments: Trace edema in legs bilaterally Pulmonary:     Effort: Tachypnea present. No respiratory distress.     Breath sounds: Normal breath sounds. No decreased breath sounds, wheezing, rhonchi or rales.     Comments: Mild  increased work of breathing at rest Chest:     Chest wall: No mass or tenderness.  Abdominal:     General: Bowel sounds are normal.     Palpations: Abdomen is soft.     Tenderness: There is no abdominal tenderness.  Musculoskeletal:        General: Normal range of motion.     Cervical back: Normal range of motion and neck supple.     Right lower leg: Edema present.     Left lower leg: Edema present.  Skin:    General: Skin is warm and dry.     Capillary Refill: Capillary refill takes less than 2 seconds.     Comments: 4 to 5 cm incision inferior to the left clavicle.  No excessive erythema, swelling, fluctuance, discharge, no evidence of dehiscence.  Neurological:     General: No focal deficit present.     Mental Status: He is alert.  Psychiatric:        Mood and Affect: Mood normal.        Behavior: Behavior normal.     ED Results / Procedures / Treatments   Labs (all labs ordered are listed, but only abnormal results are displayed) Labs Reviewed  BASIC METABOLIC PANEL - Abnormal; Notable for the following components:      Result Value   Glucose, Bld 149 (*)    All other components within normal limits  CBC  BRAIN NATRIURETIC PEPTIDE  TROPONIN I (HIGH SENSITIVITY)  TROPONIN I (HIGH SENSITIVITY)    EKG EKG Interpretation  Date/Time:  Thursday February 19 2020 13:31:01 EDT Ventricular Rate:  81 PR Interval:  142 QRS Duration: 92 QT Interval:  394 QTC Calculation: 457 R Axis:   -10 Text Interpretation: Sinus rhythm with Premature supraventricular complexes and with frequent Premature ventricular complexes Inferior infarct , age undetermined Abnormal ECG no acute changes Confirmed by Madalyn Rob [phone]) on 02/19/2020 5:44:42 PM   Radiology DG Chest 2 View  Result Date: 02/19/2020 CLINICAL DATA:  Shortness of breath for 1 day. Recent bypass graft on 02/09/2020. Current smoker. EXAM: CHEST - 2 VIEW COMPARISON:  02/12/2020 FINDINGS: Stable cardiomegaly. Streaky  opacity overlying the LEFT lung base is consistent with atelectasis  or focal infiltrate and appears stable over recent studies. RIGHT lung is clear. No pulmonary edema. IMPRESSION: 1. Stable cardiomegaly. 2. Stable LEFT LOWER lobe atelectasis or infiltrate. Electronically Signed   By: Nolon Nations M.D.   On: 02/19/2020 14:01   CT Angio Chest PE W and/or Wo Contrast  Result Date: 02/19/2020 CLINICAL DATA:  Shortness of breath for 1 week EXAM: CT ANGIOGRAPHY CHEST WITH CONTRAST TECHNIQUE: Multidetector CT imaging of the chest was performed using the standard protocol during bolus administration of intravenous contrast. Multiplanar CT image reconstructions and MIPs were obtained to evaluate the vascular anatomy. CONTRAST:  13mL OMNIPAQUE IOHEXOL 350 MG/ML SOLN COMPARISON:  Chest x-ray from earlier in the same day. FINDINGS: Cardiovascular: Mild dilatation of the ascending aorta is noted to 4.1 cm. No dissection is seen. Coronary calcifications are identified. No cardiac enlargement is noted. Pulmonary artery shows a normal branching pattern without intraluminal filling defect to suggest embolism. Proximal left subclavian artery occlusion is noted. A left carotid to subclavian bypass is noted. Mild postoperative changes are seen. Mediastinum/Nodes: Thoracic inlet is within normal limits. Mild mediastinal lipomatosis is seen. No significant hilar or mediastinal adenopathy is noted. Esophagus is within normal limits. Lungs/Pleura: Lungs are well aerated bilaterally. Some areas of air trapping are identified. Left lower lobe consolidation is noted similar that seen on recent chest x-ray. Upper Abdomen: Fatty infiltration of the liver is seen. No other focal abnormality is noted. Musculoskeletal: No chest wall abnormality. No acute or significant osseous findings. Review of the MIP images confirms the above findings. IMPRESSION: Left lower lobe consolidation consistent with acute infiltrate. Mild dilatation of the  ascending aorta 4.1 cm. Recommend annual imaging followup by CTA or MRA. This recommendation follows 2010 ACCF/AHA/AATS/ACR/ASA/SCA/SCAI/SIR/STS/SVM Guidelines for the Diagnosis and Management of Patients with Thoracic Aortic Disease. Circulation. 2010; [id number]. Aortic aneurysm NOS (ICD10-I71.9) Changes consistent with recent left carotid to left subclavian bypass due to left subclavian occlusion. Aortic Atherosclerosis (ICD10-I70.0). Aortic aneurysm NOS (ICD10-I71.9). Electronically Signed   By: Inez Catalina M.D.   On: 02/19/2020 21:03    Procedures Procedures (including critical care time)  Medications Ordered in ED Medications  iohexol (OMNIPAQUE) 350 MG/ML injection 100 mL (100 mLs Intravenous Contrast Given 02/19/20 1946)    ED Course  I have reviewed the triage vital signs and the nursing notes.  Pertinent labs & imaging results that were available during my care of the patient were reviewed by me and considered in my medical decision making (see chart for details).    MDM Rules/Calculators/A&P                     49 year old male s/p left subclavian bypass surgery 2 weeks ago with increased shortness of breath since the surgery. On presentation to the ER, patient is an obese, chronically ill-appearing male with mild increased work of breathing when speaking.  He states that this is not normal for him.  However he is nondiaphoretic, alert oriented, resting comfortably in the bed.  Vitals overall reassuring, mildly tachypneic but O2 sats reassuring.  Physical exam positive for trace edema in legs bilaterally but no other acute findings.  Concern for new onset heart failure, PE.    CBC without leukocytosis, normal hemoglobin.  BMP without electrolyte abnormalities, no renal dysfunction.  Mildly elevated glucose at 149.  BNP normal.  Serial troponins unchanged and negative.  Chest x-ray with stable cardiomegaly and stable left lower lobe atelectasis.  Will order CT PE study to rule  out  PE.  Anticipate vascular consult for further guidance.   CT PE: IMPRESSION:  Left lower lobe consolidation consistent with acute infiltrate.    Mild dilatation of the ascending aorta 4.1 cm. Recommend annual  imaging followup by CTA or MRA. This recommendation follows 2010  ACCF/AHA/AATS/ACR/ASA/SCA/SCAI/SIR/STS/SVM Guidelines for the  Diagnosis and Management of Patients with Thoracic Aortic Disease.  Circulation. 2010; [id number]. Aortic aneurysm NOS (ICD10-I71.9)    Changes consistent with recent left carotid to left subclavian  bypass due to left subclavian occlusion.   Dr. Roslynn Amble saw and evaluated the patient, and after consulting with vascular, it was established that the patient is overall well-appearing, with a reassuring work-up and is stable for discharge.  Will treat for CAP with Doxy x7 days.  CT without evidence of acute abnormalities, no acute changes s/p left subclavian bypass.  Did note a 4.1 cm dilation of the ascending aorta, the patient was informed that he will need annual follow-up imaging, stressed follow-up with PCP.  Stressed follow-up with cardiology as well within the week.  Patient is overall reassured by the work-up, voices understanding, and is agreeable to the plan.  Strict return precautions given.  At this stage in the ED course, the patient has been adequately screened and is stable for discharge.  Patient was seen and evaluated by Dr. Roslynn Amble and he is agreeable to the above plan. Final Clinical Impression(s) / ED Diagnoses Final diagnoses:  SOB (shortness of breath)    Rx / DC Orders ED Discharge Orders         Ordered    doxycycline (VIBRAMYCIN) 100 MG capsule  2 times daily     02/19/20 2123           Lyndel Safe 02/19/20 2217    Lucrezia Starch, MD 02/21/20 1257

## 2020-02-20 ENCOUNTER — Telehealth: Payer: Self-pay

## 2020-02-20 NOTE — Telephone Encounter (Signed)
Pt's wife Hilda Blades called with questions regarding pt's ED d/c instructions. She is aware of pt's f/u on 5/13. She is going to call cardiology and PCP to schedule pt's f/u. She will call us back if she has further questions/concerns.

## 2020-02-23 ENCOUNTER — Institutional Professional Consult (permissible substitution): Payer: Self-pay | Admitting: Neurology

## 2020-02-24 NOTE — Progress Notes (Signed)
Cardiology Clinic Note   Patient Name: MIZELL MERLOS Date of Encounter: 02/25/2020  Primary Care Provider:  Denita Lung, MD Primary Cardiologist:  [name], MD  Patient Profile    [name] 49 year old male presents the clinic today for follow-up of his shortness of breath.  Past Medical History    Past Medical History:  Diagnosis Date   Arteriosclerotic cardiovascular disease (ASCVD) 2006, 2012   2006-acute IMI treated with urgent RCA stent; 2007-Cutting Balloon for in-stent restenosis; 02/2010-presented with ACS and minimal troponin elevation:70% LAD, 80% distal circumflex, 80% proximal ramus branch vessel,in-stent restenosis of 70% in the RCA; BMS for proximal critical RCA stenosis, restenosis Nov 2012   Carotid artery occlusion    Heart attack ([place])    History of noncompliance with medical treatment    Due to financial considerations   Hyperlipidemia    Hypertension    Stroke ([place])    Tobacco abuse    40 pack years   Past Surgical History:  Procedure Laterality Date   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   Procedure: BYPASS GRAFT CAROTID-SUBCLAVIAN USING HEMASHIELD GOLD GRAFT;  Surgeon: Elam Dutch, MD;  Location: [place];  Service: Vascular;  Laterality: Left;   CORONARY ANGIOPLASTY WITH STENT PLACEMENT     IR ANGIO EXTERNAL CAROTID SEL EXT CAROTID BILAT MOD SED  12/15/2019   IR ANGIO VERTEBRAL SEL VERTEBRAL UNI R MOD SED  12/15/2019   IR ANGIOGRAM EXTREMITY LEFT  12/15/2019   IR ANGIOGRAM EXTREMITY LEFT  12/15/2019   IR CT HEAD LTD  12/15/2019   IR PERCUTANEOUS ART THROMBECTOMY/INFUSION INTRACRANIAL INC DIAG ANGIO  12/15/2019   RADIOLOGY WITH ANESTHESIA N/A 12/15/2019   Procedure: IR WITH ANESTHESIA;  Surgeon: Luanne Bras, MD;  Location: [place];  Service: Radiology;  Laterality: N/A;    Allergies  No Known Allergies  History of Present Illness    Mr. Beaner has a PMH of coronary artery disease (inferior MI 2006  at the age of 75) had right coronary stenting and had developed some in-stent restenosis.  He also had ACS in 2011 catheterization showed significant progression of CAD but no significant obstruction.  He also has a history of TIA, hyperlipidemia, family history of heart disease (father died of MI in his 23s) recent LDL 12.  He was referred to lipid clinic and PCSK9 inhibitor was recommended.  He presented to the emergency department on 02/19/2020 with increased work of breathing 2 weeks after his left subclavian bypass by Dr. Oneida Alar.  He stated he had contacted Dr. Oneida Alar office with concerns of shortness of breath and wants directed to present to the emergency department.  His chest x-ray showed stable cardiomegaly and stable left lower lobe atelectasis.  CT showed left lower lobe consolidation consistent with acute infiltrates.  He was diagnosed with community-acquired pneumonia and started on doxycycline for 7 days.  He presents the clinic today for follow-up evaluation and states he noticed increased work of breathing after his surgery with Dr. Oneida Alar.  He states he presented to follow-up with Dr. Oneida Alar and was given 3 days worth of Lasix.  He feels that he had good urine output with the medication however, it did not help with his breathing.  He then presented to the emergency department on Dr. Nona Dell recommendation.  He was given doxycycline which she continues to take.  He still notices dyspnea with exertion.  He states he does not feel swollen and has lost weight.  Given his recent cardiac  evaluation I do not feel this is related to cardiac causes and will refer to pulmonology.  I will give him salty 6 and have him follow-up with Dr. Gwenlyn Found in 6 months.  Today he denies chest pain, increased shortness of breath, lower extremity edema, fatigue, palpitations, melena, hematuria, hemoptysis, diaphoresis, weakness, presyncope, syncope, orthopnea, and PND.   Home Medications    Prior to Admission  medications   Medication Sig Start Date End Date Taking? Authorizing Provider  aspirin EC 325 MG EC tablet Take 1 tablet (325 mg total) by mouth daily. 12/18/19 03/17/20  Donzetta Starch, NP  atorvastatin (LIPITOR) 80 MG tablet Take 1 tablet (80 mg total) by mouth daily. 10/31/19   Denita Lung, MD  clopidogrel (PLAVIX) 75 MG tablet Take 1 tablet (75 mg total) by mouth daily. 12/18/19   Donzetta Starch, NP  doxycycline (VIBRAMYCIN) 100 MG capsule Take 1 capsule (100 mg total) by mouth 2 (two) times daily for 7 days. 02/19/20 02/26/20  Garald Balding, PA-C  ezetimibe (ZETIA) 10 MG tablet Take 1 tablet (10 mg total) by mouth daily. 01/02/20 04/01/20  Hilty, Nadean Corwin, MD  furosemide (LASIX) 40 MG tablet Take 1 tablet (40 mg total) by mouth daily for 3 days. 02/12/20 02/19/20  Elam Dutch, MD  oxyCODONE-acetaminophen (PERCOCET/ROXICET) 5-325 MG tablet Take 1 tablet by mouth every 6 (six) hours as needed for moderate pain. 02/10/20   Dagoberto Ligas, PA-C  potassium chloride (MICRO-K) 10 MEQ CR capsule Take 2 capsules (20 mEq total) by mouth 2 (two) times daily for 3 days. 02/12/20 02/19/20  Elam Dutch, MD    Family History    Family History  Problem Relation Age of Onset   Depression Mother 18       Suicide   Heart disease Father 31       Deceased from massive heart-attack   Hyperlipidemia Father    Hypertension Father    COPD Maternal Grandfather    Cancer Paternal Grandmother        Male Cancer   Heart disease Paternal Grandmother    He indicated that his mother is deceased. He indicated that his father is deceased. He indicated that the status of his maternal grandfather is unknown. He indicated that the status of his paternal grandmother is unknown.  Social History    Social History   Socioeconomic History   Marital status: Married    Spouse name: Not on file   Number of children: 2   Years of education: Not on file   Highest education level: Not on file   Occupational History   Occupation: Disabled    Comment: Previously employed as an Programmer, systems  Tobacco Use   Smoking status: Current Every Day Smoker    Packs/day: 0.75    Years: 27.00    Pack years: 20.25    Types: Cigarettes   Smokeless tobacco: Never Used  Substance and Sexual Activity   Alcohol use: No    Comment:  Alcoholism- quit 2002   Drug use: No   Sexual activity: Not on file  Other Topics Concern   Not on file  Social History Narrative            Social Determinants of Health   Financial Resource Strain:    Difficulty of Paying Living Expenses:   Food Insecurity:    Worried About [place] in the Last Year:    [place] in the Last Year:  Transportation Needs:    Film/video editor (Medical):    Lack of Transportation (Non-Medical):   Physical Activity:    Days of Exercise per Week:    Minutes of Exercise per Session:   Stress:    Feeling of Stress :   Social Connections:    Frequency of Communication with Friends and Family:    Frequency of Social Gatherings with Friends and Family:    Attends Religious Services:    Active Member of Clubs or Organizations:    Attends Music therapist:    Marital Status:   Intimate Partner Violence:    Fear of Current or Ex-Partner:    Emotionally Abused:    Physically Abused:    Sexually Abused:      Review of Systems    General:  No chills, fever, night sweats or weight changes.  Cardiovascular:  No chest pain, dyspnea on exertion, edema, orthopnea, palpitations, paroxysmal nocturnal dyspnea. Dermatological: No rash, lesions/masses Respiratory: No cough, dyspnea Urologic: No hematuria, dysuria Abdominal:   No nausea, vomiting, diarrhea, bright red blood per rectum, melena, or hematemesis Neurologic:  No visual changes, wkns, changes in mental status. All other systems reviewed and are otherwise negative except as noted above.  Physical  Exam    VS:  BP 112/74    Pulse 99    Temp 97.7 F (36.5 C) (Temporal)    Ht 5\' 9"  (1.753 m)    Wt (!) 301 lb 9.6 oz (136.8 kg)    SpO2 95%    BMI 44.54 kg/m  , BMI Body mass index is 44.54 kg/m. GEN: Well nourished, well developed, in no acute distress. HEENT: normal. Neck: Supple, no JVD, carotid bruits, or masses. Cardiac: RRR, no murmurs, rubs, or gallops. No clubbing, cyanosis, edema.  Radials/DP/PT 2+ and equal bilaterally.  Respiratory:  Respirations regular and unlabored, clear to auscultation bilaterally. GI: Soft, nontender, nondistended, BS + x 4. MS: no deformity or atrophy. Skin: warm and dry, no rash. Neuro:  Strength and sensation are intact. Psych: Normal affect.  Accessory Clinical Findings    ECG personally reviewed by me today-none today.  Echocardiogram 12/15/2019 IMPRESSIONS    1. Left ventricular ejection fraction, by estimation, is 55 to 60%. The  left ventricle has normal function. Left ventricular endocardial border  not optimally defined to evaluate regional wall motion. The left  ventricular internal cavity size was mildly  dilated. Left ventricular diastolic parameters are indeterminate. Elevated  left ventricular end-diastolic pressure.  2. Right ventricular systolic function is normal. The right ventricular  size is normal. Tricuspid regurgitation signal is inadequate for assessing  PA pressure.  3. The mitral valve is normal in structure and function. No evidence of  mitral valve regurgitation. No evidence of mitral stenosis.  4. The aortic valve is tricuspid. Aortic valve regurgitation is trivial.  No aortic stenosis is present.  5. The inferior vena cava is normal in size with greater than 50%  respiratory variability, suggesting right atrial pressure of 3 mmHg.    Assessment & Plan   1.  Shortness of breath-DOE.  Presented to the emergency department on 02/19/2020 was diagnosed with CAP and prescribed antibiotics.  This appears to be  pulmonary in nature.  Echocardiogram showed normal LVEF and intermediate diastolic parameters.  He is euvolemic today.   Finish doxycycline. Heart healthy low-sodium diet-salty 6 given Increase physical activity as tolerated Referral to pulmonology  Coronary artery disease-no chest pain today.  MI 2006-received RCA stent, later found  to have in-stent restenosis.  NSTEMI with cardiac catheterization 02/24/2010 showed new 75% in-stent restenosis in RCA stent and 99% proximal lesion both areas with PCI. Continue aspirin, atorvastatin, Plavix, Zetia, Heart healthy low-sodium diet-salty 6 given Increase physical activity as tolerated  Carotid artery disease-carotid Dopplers 12/10/2019 showed total left carotid with normal right ICA.  Indicated he was having some right upper extremity numbness.  Suggestion was made to have referral to neurology. Continue aspirin, atorvastatin, Plavix, Zetia Heart healthy low-sodium diet Increase physical activity as tolerated  Hyperlipidemia-LDL 142 on 12/04/2019.  Refered to lipid clinic for PCSK9 inhibitor.  Currently working with Dr. Debara Pickett Continue atorvastatin and Zetia Heart healthy low-sodium high-fiber diet Increase physical activity as tolerated  Morbid obesity-BMI 42.  Weight today 301.6 Continue weight loss  Disposition: Follow-up with Dr. Gwenlyn Found in 3 months.   Jossie Ng. Sawyer Kahan NP-C    02/25/2020, 3:45 PM [address] Office [phone] Fax [phone]

## 2020-02-25 ENCOUNTER — Encounter: Payer: Self-pay | Admitting: General Practice

## 2020-02-25 ENCOUNTER — Ambulatory Visit (INDEPENDENT_AMBULATORY_CARE_PROVIDER_SITE_OTHER): Payer: Self-pay | Admitting: Family Medicine

## 2020-02-25 ENCOUNTER — Other Ambulatory Visit: Payer: Self-pay

## 2020-02-25 ENCOUNTER — Ambulatory Visit (INDEPENDENT_AMBULATORY_CARE_PROVIDER_SITE_OTHER): Payer: Self-pay | Admitting: General Practice

## 2020-02-25 ENCOUNTER — Encounter: Payer: Self-pay | Admitting: Family Medicine

## 2020-02-25 VITALS — BP 136/78 | HR 90 | Temp 99.1°F | Wt 300.4 lb

## 2020-02-25 VITALS — BP 112/74 | HR 99 | Temp 97.7°F | Ht 69.0 in | Wt 301.6 lb

## 2020-02-25 DIAGNOSIS — I10 Essential (primary) hypertension: Secondary | ICD-10-CM

## 2020-02-25 DIAGNOSIS — R0602 Shortness of breath: Secondary | ICD-10-CM

## 2020-02-25 DIAGNOSIS — I779 Disorder of arteries and arterioles, unspecified: Secondary | ICD-10-CM

## 2020-02-25 DIAGNOSIS — E782 Mixed hyperlipidemia: Secondary | ICD-10-CM

## 2020-02-25 DIAGNOSIS — R0600 Dyspnea, unspecified: Secondary | ICD-10-CM

## 2020-02-25 DIAGNOSIS — I252 Old myocardial infarction: Secondary | ICD-10-CM

## 2020-02-25 DIAGNOSIS — I700 Atherosclerosis of aorta: Secondary | ICD-10-CM

## 2020-02-25 DIAGNOSIS — I2510 Atherosclerotic heart disease of native coronary artery without angina pectoris: Secondary | ICD-10-CM

## 2020-02-25 DIAGNOSIS — I712 Thoracic aortic aneurysm, without rupture, unspecified: Secondary | ICD-10-CM

## 2020-02-25 DIAGNOSIS — J189 Pneumonia, unspecified organism: Secondary | ICD-10-CM

## 2020-02-25 NOTE — Patient Instructions (Signed)
Medication Instructions:  Your physician recommends that you continue on your current medications as directed. Please refer to the Current Medication list given to you today.  *If you need a refill on your cardiac medications before your next appointment, please call your pharmacy*  Follow-Up: At [facility], you and your health needs are our priority.  As part of our continuing mission to provide you with exceptional heart care, we have created designated Provider Care Teams.  These Care Teams include your primary Cardiologist (physician) and Advanced Practice Providers (APPs -  Physician Assistants and Nurse Practitioners) who all work together to provide you with the care you need, when you need it.  We recommend signing up for the patient portal called "MyChart".  Sign up information is provided on this After Visit Summary.  MyChart is used to connect with patients for Virtual Visits (Telemedicine).  Patients are able to view lab/test results, encounter notes, upcoming appointments, etc.  Non-urgent messages can be sent to your provider as well.   To learn more about what you can do with MyChart, go to [email or web address].    Your next appointment:   3 month(s)  The format for your next appointment:   In Person  Provider:   You may see [name], MD or one of the following Advanced Practice Providers on your designated Care Team:    [name], PA-C  [name], [place]  [name], [place]    Other Instructions  You have been referred to [name], DO with [facility]  [address] [place], [place]: [phone]

## 2020-02-25 NOTE — Progress Notes (Signed)
   Subjective:    Patient ID: Brandon [name], male    DOB: 1971-04-18, 49 y.o.   MRN: [id number]  HPI He is here for follow-up after recent emergency room visit for evaluation of shortness of breath.  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  The x-rays did show evidence of edema and he is given a diuretic as well as some potassium.  The symptoms got worse and he was sent to the emergency room.  On the 29th x-ray and then follow-up CT scan did show evidence of fluid as well as an infiltrate.  He was placed on doxycycline.  He states that he is 30 to 40% better.  He does complain of waking up several times per night short of breath and sitting up in bed and subsequently symptoms diminishing.  He also complains of some peripheral edema.  He has a previous history of ASHD with MI's.  Prior to this his last cardiology evaluation was several years ago. The CT scans showed evidence of atherosclerosis as well as aortic aneurysm which will need to be followed up in another year. There is question of sleep apnea and he is scheduled for a sleep study which got postponed due to the above problems.  Review of Systems     Objective:   Physical Exam Alert and in no distress.  No tachypnea is noted.  Lungs are clear to auscultation.  Cardiac exam shows regular rhythm without murmurs or gallops.  1+ pitting edema is noted.       Assessment & Plan:  PND (paroxysmal nocturnal dyspnea)  Arteriosclerotic cardiovascular disease (ASCVD)  Essential hypertension  Pneumonia of left lower lobe due to infectious organism  Thoracic aortic aneurysm without rupture (HCC)  Atherosclerosis of aorta (HCC)  History of MI (myocardial infarction) I think his pneumonia is now essentially resolved.  His symptoms seem to be more cardiac in origin with the PND as well as his past history of heart disease. He is taking Lipitor. He will need follow-up  in 1 year on the aneurysm. We will also need to follow-up concerning his sleep apnea.  Of note is the fact that he is in the process of applying for disability and Medicaid.

## 2020-02-26 ENCOUNTER — Ambulatory Visit: Payer: Self-pay | Admitting: Family Medicine

## 2020-03-02 ENCOUNTER — Ambulatory Visit (INDEPENDENT_AMBULATORY_CARE_PROVIDER_SITE_OTHER): Payer: Self-pay | Admitting: Pulmonary Disease

## 2020-03-02 ENCOUNTER — Other Ambulatory Visit: Payer: Self-pay

## 2020-03-02 ENCOUNTER — Encounter: Payer: Self-pay | Admitting: Pulmonary Disease

## 2020-03-02 VITALS — BP 138/80 | HR 104 | Ht 69.0 in | Wt 299.2 lb

## 2020-03-02 DIAGNOSIS — R0602 Shortness of breath: Secondary | ICD-10-CM

## 2020-03-02 MED ORDER — BREO ELLIPTA 100-25 MCG/INH IN AEPB: 1.0000 | INHALATION_SPRAY | Freq: Every day | RESPIRATORY_TRACT | 5 refills | Status: DC

## 2020-03-02 MED ORDER — BREO ELLIPTA 100-25 MCG/INH IN AEPB: 1.0000 | INHALATION_SPRAY | Freq: Every day | RESPIRATORY_TRACT | 0 refills | Status: DC

## 2020-03-02 MED ORDER — ALBUTEROL SULFATE HFA 108 (90 BASE) MCG/ACT IN AERS
2.0000 | INHALATION_SPRAY | Freq: Four times a day (QID) | RESPIRATORY_TRACT | 5 refills | Status: DC | PRN
Start: 2020-03-02 — End: 2023-10-25

## 2020-03-02 NOTE — Patient Instructions (Signed)
Shortness of breath Active smoking Obesity  We will start you on Breo Continue albuterol  Weight loss efforts  Smoking cessation efforts  Follow-up with the sleep study  Call with significant concerns  Quitting smoking will be the best thing you can do for yourself

## 2020-03-02 NOTE — Progress Notes (Signed)
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 aspirin EC 325 MG EC tablet Take 1 tablet (325 mg total) by mouth daily.  Marland Kitchen atorvastatin (LIPITOR) 80 MG tablet Take 1 tablet (80 mg total) by mouth daily.  . clopidogrel (PLAVIX) 75 MG tablet Take 1 tablet (75 mg total) by mouth daily.  Marland Kitchen ezetimibe (ZETIA) 10 MG tablet Take 1 tablet (10 mg total) by mouth daily.  . [DISCONTINUED] oxyCODONE-acetaminophen (PERCOCET/ROXICET) 5-325 MG tablet Take 1 tablet by mouth every 6 (six) hours as needed for moderate pain.   No facility-administered encounter medications on file as of 03/02/2020.    Allergies as of 03/02/2020  . (No Known Allergies)    Past Medical History:  Diagnosis Date  . Arteriosclerotic cardiovascular disease (ASCVD) 2006, 2012   2006-acute IMI treated with urgent RCA stent; 2007-Cutting Balloon for in-stent restenosis; 02/2010-presented with ACS and minimal troponin elevation:70% LAD, 80% distal circumflex, 80% proximal ramus branch vessel,in-stent restenosis of 70% in the RCA; BMS for proximal critical RCA stenosis, restenosis Nov 2012  . Carotid artery occlusion   . Heart attack ([place])   . History of noncompliance with medical  treatment    Due to financial considerations  . Hyperlipidemia   . Hypertension   . Stroke ([place])   . Tobacco abuse    40 pack years    Past Surgical History:  Procedure Laterality Date  . 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   Procedure: BYPASS GRAFT CAROTID-SUBCLAVIAN USING HEMASHIELD GOLD GRAFT;  Surgeon: Elam Dutch, MD;  Location: [place];  Service: Vascular;  Laterality: Left;  . CORONARY ANGIOPLASTY WITH STENT PLACEMENT    . IR ANGIO EXTERNAL CAROTID SEL EXT CAROTID BILAT MOD SED  12/15/2019  . IR ANGIO VERTEBRAL SEL VERTEBRAL UNI R MOD SED  12/15/2019  . IR ANGIOGRAM EXTREMITY LEFT  12/15/2019  . IR ANGIOGRAM EXTREMITY LEFT  12/15/2019  . IR CT HEAD LTD  12/15/2019  . IR PERCUTANEOUS ART THROMBECTOMY/INFUSION INTRACRANIAL INC DIAG ANGIO  12/15/2019  . RADIOLOGY WITH ANESTHESIA N/A 12/15/2019   Procedure: IR WITH ANESTHESIA;  Surgeon: Luanne Bras, MD;  Location: [place];  Service: Radiology;  Laterality: N/A;    Family History  Problem Relation Age of Onset  . Depression Mother 57       Suicide  . Heart disease Father 70       Deceased from massive heart-attack  . Hyperlipidemia Father   . Hypertension Father   . COPD Maternal Grandfather   . Cancer Paternal Grandmother        Male Cancer  . Heart disease Paternal Grandmother     Social History   Socioeconomic  History  . Marital status: Married    Spouse name: Not on file  . Number of children: 2  . Years of education: Not on file  . Highest education level: Not on file  Occupational History  . 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 Smoking status: Current Every Day Smoker    Packs/day: 1.00    Years: 34.00    Pack years: 34.00    Types: Cigarettes  . Smokeless tobacco: Never Used  Substance and Sexual Activity  . Alcohol use: No    Comment:  Alcoholism- quit 2002  . Drug use: No  . Sexual activity: Not on file  Other Topics  Concern  . Not on file  Social History Narrative            Social Determinants of Health   Financial Resource Strain:   . Difficulty of Paying Living Expenses:   Food Insecurity:   . Worried About Charity fundraiser in the Last Year:   . Arboriculturist in the Last Year:   Transportation Needs:   . Film/video editor (Medical):   Marland Kitchen Lack of Transportation (Non-Medical):   Physical Activity:   . Days of Exercise per Week:   . Minutes of Exercise per Session:   Stress:   . Feeling of Stress :   Social Connections:   . Frequency of Communication with Friends and Family:   . Frequency of Social Gatherings with Friends and Family:   . Attends Religious Services:   . Active Member of Clubs or Organizations:   . Attends Archivist Meetings:   Marland Kitchen Marital Status:   Intimate Partner Violence:   . Fear of Current or Ex-Partner:   . Emotionally Abused:   Marland Kitchen Physically Abused:   . Sexually Abused:     Review of Systems  Constitutional: Positive for fatigue.  Respiratory: Positive for cough and shortness of breath.     Vitals:   03/02/20 1017  BP: 138/80  Pulse: (!) 104  SpO2: 95%     Physical Exam  Constitutional: He appears well-developed.  Obese  HENT:  Head: Normocephalic.  Mallampati 4, crowded oropharynx  Eyes: Pupils are equal, round, and reactive to light. Conjunctivae are normal. Left eye exhibits no discharge.  Neck: No tracheal deviation present. No thyromegaly present.  Cardiovascular: Normal rate and regular rhythm.  Pulmonary/Chest: Effort normal and breath sounds normal. No respiratory distress. He has no wheezes. He has no rales. He exhibits no tenderness.  Musculoskeletal:        General: Normal range of motion.  Neurological: He is alert.  Skin: Skin is warm.  Psychiatric: He has a normal mood and affect.   Data Reviewed: CT scan recently shows groundglass changes Left lower lobe atelectasis  Assessment:  Shortness of  breath  Morbid obesity  May have some degree of hypoventilation  Smoker  Plan/Recommendations: Smoking cessation counseling  Breo 100 to be used daily  Albuterol as needed  Graded exercises  Pulmonary function testing  Smoking cessation is about the only thing I can do that will help him significantly  He will follow up with a sleep study   Sherrilyn Rist MD [facility] 03/02/2020, 10:56 AM  CC: Deberah Pelton, NP

## 2020-03-04 ENCOUNTER — Encounter: Payer: Self-pay | Admitting: Vascular Surgery

## 2020-03-04 ENCOUNTER — Other Ambulatory Visit: Payer: Self-pay

## 2020-03-04 ENCOUNTER — Ambulatory Visit (INDEPENDENT_AMBULATORY_CARE_PROVIDER_SITE_OTHER): Payer: Self-pay | Admitting: Vascular Surgery

## 2020-03-04 VITALS — BP 134/89 | HR 99 | Temp 98.0°F | Resp 20 | Ht 69.0 in | Wt 304.0 lb

## 2020-03-04 DIAGNOSIS — I708 Atherosclerosis of other arteries: Secondary | ICD-10-CM

## 2020-03-04 NOTE — Progress Notes (Signed)
Patient is a 49 year old male who returns for postoperative follow-up today.  He underwent left carotid subclavian bypass on February 09, 2020.  He still has some areas of dysesthesia and numbness and tingling over the lateral aspect of his left arm.  He has no motor deficits.  He had several episodes of shortness of breath and was evaluated with an inspiration expiration chest x-ray which showed no diaphragm dysfunction.  He was recently seen by pulmonary was thought most of this was due to his smoking.  He apparently has a sleep study scheduled as well as well as pulmonary function testing.  He was also evaluated by cardiology as part of this shortness of breath work-up.  Physical exam:  Vitals:   03/04/20 1505  BP: 134/89  Pulse: 99  Resp: 20  Temp: 98 F (36.7 C)  SpO2: 93%  Weight: (!) 304 lb (137.9 kg)  Height: 5\' 9"  (1.753 m)    Extremities: 2+ symmetric radial pulses bilaterally  Neck: Well-healed left supraclavicular incision  Neuro: Symmetric upper extremity lower extremity motor strength 5/5 no facial asymmetry  Assessment: Doing well status post left carotid subclavian bypass.  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  Hopefully this will continue to improve with time.  Graft is patent clinically with easily palpable left radial pulse which was absent preoperatively.  Plan: The patient will follow up with a carotid duplex scan in 8 months time.  He will continue his Plavix aspirin and statin.  He will call us if he has any new neurologic symptoms.  Patient was encouraged to try to continue to quit smoking.  Ruta Hinds, MD [facility] Office: [phone]

## 2020-03-08 ENCOUNTER — Other Ambulatory Visit: Payer: Self-pay | Admitting: *Deleted

## 2020-03-08 DIAGNOSIS — I6522 Occlusion and stenosis of left carotid artery: Secondary | ICD-10-CM

## 2020-03-18 NOTE — Progress Notes (Deleted)
[facility] [address] [place]. [place] [address] [phone]       OFFICE FOLLOW UP NOTE  Brandon [name] Date of Birth:  1971/05/08 Medical Record Number:  [id number]   Referring MD: Burnetta [name] Reason for Referral: Stroke  HPI:   Today, 03/23/2020, Brandon [name] returns for 56-month stroke follow-up.  Stable from stroke standpoint since prior visit without new or reoccurring stroke/TIA symptoms.  Continues on clopidogrel, aspirin and atorvastatin for secondary stroke prevention.  Denies associated side effects.  Blood pressure today ***. Underwent left carotid to subclavian bypass due to symptomatic left subclavian artery occlusion by Brandon [name] on 02/09/2020 tolerated well without complication.  Recent follow-up with Brandon [name] who recommended continuation of DAPT and statin and will have repeat carotid duplex 8 months post procedure.    History provided for reference purposes only Initial visit 01/27/2020 Dr. Leonie Man: Brandon [name] is a 49 year old obese Caucasian male seen today for initial office consultation visit for stroke. History is obtained from the patient, review of electronic medical records and I personally reviewed imaging films in PACS. Brandon [name] is a past medical history of coronary artery disease status post stent, hypertension, hyperlipidemia, obesity and tobacco abuse who presented to [facility] ED on 12/15/2019 with strokelike symptoms which resolved completely but CT scan showed look like subacute left frontal and parietal MCA branch infarcts. He was found to have left carotid occlusion with some distal recanalization to small core but a large penumbra. He was not considered a candidate for TPA due to late presentation. He was transferred to [facility] with plans for emergent revascularization but despite attempts by Dr. Estanislado Pandy left carotid could not be opened up. The left subclavian was also found to be occluded. Patient was unable to  tolerate an MRI. CT scan showed subacute left parietal and frontal MCA branch infarcts. 2D echo showed normal ejection fraction without cardiac source of embolism. LDL cholesterol 93 mg percent. Hemoglobin A1c was 6.1. Patient was started on dual antiplatelet therapy of aspirin and Plavix and had outpatient vascular surgery referral for subclavian artery bypass to be done electively and in fact has an appointment to have this done by [name] on 02/09/2020. He states is done well since discharge has had no recurrent stroke or TIA symptoms. He denies symptoms of subclavian steal in the form of dizziness vertigo blurred vision with raising of arms above his shoulders on looking up. He however does complain of some short-term memory and cognitive difficulties which are not progressive but are annoying. Is tolerating aspirin and Plavix without bruising or bleeding. He is also tolerating Lipitor well without muscle aches and pains. He still continues to smoke half pack per day and knows he needs has to quit. He has filed for disability.  ROS:   14 system review of systems is positive for tiredness, weakness, sleepiness, decreased short-term memory all other systems negative  PMH:  Past Medical History:  Diagnosis Date  . Arteriosclerotic cardiovascular disease (ASCVD) 2006, 2012   2006-acute IMI treated with urgent RCA stent; 2007-Cutting Balloon for in-stent restenosis; 02/2010-presented with ACS and minimal troponin elevation:70% LAD, 80% distal circumflex, 80% proximal ramus branch vessel,in-stent restenosis of 70% in the RCA; BMS for proximal critical RCA stenosis, restenosis Nov 2012  . Carotid artery occlusion   . Heart attack ([place])   . History of noncompliance with medical treatment    Due to financial considerations  . Hyperlipidemia   . Hypertension   .  Stroke ([place])   . Tobacco abuse    40 pack years    Social History:  Social History   Socioeconomic History  . Marital status:  Married    Spouse name: Not on file  . Number of children: 2  . Years of education: Not on file  . Highest education level: Not on file  Occupational History  . 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 Smoking status: Current Every Day Smoker    Packs/day: 1.00    Years: 34.00    Pack years: 34.00    Types: Cigarettes  . Smokeless tobacco: Never Used  Substance and Sexual Activity  . Alcohol use: No    Comment:  Alcoholism- quit 2002  . Drug use: No  . Sexual activity: Not on file  Other Topics Concern  . Not on file  Social History Narrative            Social Determinants of Health   Financial Resource Strain:   . Difficulty of Paying Living Expenses:   Food Insecurity:   . Worried About Charity fundraiser in the Last Year:   . Arboriculturist in the Last Year:   Transportation Needs:   . Film/video editor (Medical):   Marland Kitchen Lack of Transportation (Non-Medical):   Physical Activity:   . Days of Exercise per Week:   . Minutes of Exercise per Session:   Stress:   . Feeling of Stress :   Social Connections:   . Frequency of Communication with Friends and Family:   . Frequency of Social Gatherings with Friends and Family:   . Attends Religious Services:   . Active Member of Clubs or Organizations:   . Attends Archivist Meetings:   Marland Kitchen Marital Status:   Intimate Partner Violence:   . Fear of Current or Ex-Partner:   . Emotionally Abused:   Marland Kitchen Physically Abused:   . Sexually Abused:     Medications:   Current Outpatient Medications on File Prior to Visit  Medication Sig Dispense Refill  . albuterol (VENTOLIN HFA) 108 (90 Base) MCG/ACT inhaler Inhale 2 puffs into the lungs every 6 (six) hours as needed. 18 g 5  . atorvastatin (LIPITOR) 80 MG tablet Take 1 tablet (80 mg total) by mouth daily. 90 tablet 3  . clopidogrel (PLAVIX) 75 MG tablet Take 1 tablet (75 mg total) by mouth daily. 30 tablet 2    . ezetimibe (ZETIA) 10 MG tablet Take 1 tablet (10 mg total) by mouth daily. 90 tablet 3  . fluticasone furoate-vilanterol (BREO ELLIPTA) 100-25 MCG/INH AEPB Inhale 1 puff into the lungs daily. 28 each 0  . fluticasone furoate-vilanterol (BREO ELLIPTA) 100-25 MCG/INH AEPB Inhale 1 puff into the lungs daily. 60 each 5   No current facility-administered medications on file prior to visit.    Allergies:  No Known Allergies  Vitals There were no vitals filed for this visit. There is no height or weight on file to calculate BMI.    Physical Exam General: Obese middle-aged Caucasian male, seated, in no evident distress Head: head normocephalic and atraumatic.   Neck: supple with no carotid or supraclavicular bruits Cardiovascular: regular rate and rhythm, no murmurs Musculoskeletal: no deformity Skin:  no rash/petichiae Vascular:  Normal pulses all extremities except left radial pulse is absent  Neurologic Exam Mental Status: Awake and fully alert. Oriented to place and time. Recent and remote memory  intact. Attention span, concentration and fund of knowledge appropriate. Mood and affect appropriate.  Cranial Nerves: Fundoscopic exam reveals sharp disc margins. Pupils equal, briskly reactive to light. Extraocular movements full without nystagmus. Visual fields full to confrontation. Hearing intact. Facial sensation intact. Face, tongue, palate moves normally and symmetrically.  Motor: Normal bulk and tone. Normal strength in all tested extremity muscles. Sensory.: intact to touch , pinprick , position and vibratory sensation.  Coordination: Rapid alternating movements normal in all extremities. Finger-to-nose and heel-to-shin performed accurately bilaterally. Gait and Station: Arises from chair without difficulty. Stance is normal. Gait demonstrates normal stride length and balance . Able to heel, toe and tandem walk with mild difficulty.  Reflexes: 1+ and symmetric. Toes downgoing.       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 He is scheduled to undergo subclavian bypass and vascular surgery in a few weeks.     PLAN: Continue aspirin and Plavix for 3 months and then aspirin alone for secondary stroke prevention  maintain strict control of hypertension with blood pressure goal below 130/90, diabetes with hemoglobin A1c goal below 6.5% and lipids with LDL cholesterol goal below 70 mg/dL. I also advised the patient to eat a healthy diet with plenty of whole grains, cereals, fruits and vegetables, exercise regularly and maintain ideal body weight .he was counseled to quit smoking completely.   Referred for polysomnogram for sleep apnea which he appears to be at high risk for.   Patient is scheduled to undergo subclavian bypass surgery in a few weeks and may need to stop Plavix 3 to 5 days prior to the procedure.    Greater than 50% time during this 50-minute consultation visit was spent on counseling and coordination of care about his strokes, carotid and subclavian occlusion and discussion about revascularization and stroke prevention options and answering questions   Frann Rider, [facility]  [facility] [address] [place], [place] [address]  Phone [phone] Fax [phone] Note: This document was prepared with digital dictation and possible smart phrase technology. Any transcriptional errors that result from this process are unintentional.

## 2020-03-23 ENCOUNTER — Ambulatory Visit: Payer: Self-pay | Admitting: Adult Health

## 2020-03-26 LAB — LIPID PANEL
Chol/HDL Ratio: 4.1 ratio (ref 0.0–5.0)
Cholesterol, Total: 155 mg/dL (ref 100–199)
HDL: 38 mg/dL — ABNORMAL LOW (ref >39)
LDL Chol Calc (NIH): 93 mg/dL (ref 0–99)
Triglycerides: 135 mg/dL (ref 0–149)
VLDL Cholesterol Cal: 24 mg/dL (ref 5–40)

## 2020-03-29 ENCOUNTER — Telehealth: Payer: Self-pay | Admitting: Family Medicine

## 2020-03-29 ENCOUNTER — Encounter: Payer: Self-pay | Admitting: Family Medicine

## 2020-03-29 NOTE — Telephone Encounter (Signed)
Wife called need letter for food stamps, completed.

## 2020-03-31 ENCOUNTER — Encounter: Payer: Self-pay | Admitting: Internal Medicine

## 2020-03-31 ENCOUNTER — Telehealth (INDEPENDENT_AMBULATORY_CARE_PROVIDER_SITE_OTHER): Payer: Self-pay | Admitting: Internal Medicine

## 2020-03-31 VITALS — BP 144/93 | HR 98 | Ht 69.0 in | Wt 292.0 lb

## 2020-03-31 DIAGNOSIS — I252 Old myocardial infarction: Secondary | ICD-10-CM

## 2020-03-31 DIAGNOSIS — G459 Transient cerebral ischemic attack, unspecified: Secondary | ICD-10-CM

## 2020-03-31 DIAGNOSIS — E785 Hyperlipidemia, unspecified: Secondary | ICD-10-CM

## 2020-03-31 NOTE — Patient Instructions (Signed)
Medication Instructions:  Dr. Debara Pickett recommends Repatha 140mg /mL (PCSK9). This is an injectable cholesterol medication self-administered once every 14 days. This medication will need prior approval with your insurance company, which we will work on. If the medication is not approved initially, we may need to do an appeal with your insurance. We will keep you updated on this process.   Administer medication in area of fatty tissue such as abdomen, outer thigh, back up of arm - and rotate site with each injection Store medication in refrigerator until ready to administer - allow to sit at room temp for 30 mins - 1 hour prior to injection Dispose of medication in a SHARPS container - your pharmacy should be able to direct you on this and proper disposal   If you need co-pay assistance, please look into the program at healthwellfoundation.[email or web address] >> disease funds >> hypercholesterolemia. This is an online application or you can call to complete.   Please complete patient assistance application and return to office via fax [phone]) or mail or attach via MyChart message or email to [email or web address]  *If you need a refill on your cardiac medications before your next appointment, please call your pharmacy*   Lab Work: FASTING lipid panel in 3 months   If you have labs (blood work) drawn today and your tests are completely normal, you will receive your results only by: Marland Kitchen MyChart Message (if you have MyChart) OR . A paper copy in the mail If you have any lab test that is abnormal or we need to change your treatment, we will call you to review the results.   Testing/Procedures: NONE   Follow-Up: At [facility], you and your health needs are our priority.  As part of our continuing mission to provide you with exceptional heart care, we have created designated Provider Care Teams.  These Care Teams include your primary Cardiologist (physician) and Advanced Practice Providers (APPs -   Physician Assistants and Nurse Practitioners) who all work together to provide you with the care you need, when you need it.  We recommend signing up for the patient portal called "MyChart".  Sign up information is provided on this After Visit Summary.  MyChart is used to connect with patients for Virtual Visits (Telemedicine).  Patients are able to view lab/test results, encounter notes, upcoming appointments, etc.  Non-urgent messages can be sent to your provider as well.   To learn more about what you can do with MyChart, go to [email or web address].    Your next appointment:   3-4 month(s) - lipid clinic  The format for your next appointment:   Either In Person or Virtual  Provider:   K. Mali Hilty, MD   Other Instructions

## 2020-03-31 NOTE — Progress Notes (Signed)
Virtual Visit via Telephone Note   This visit type was conducted due to national recommendations for restrictions regarding the COVID-19 Pandemic (e.g. social distancing) in an effort to limit this patient's exposure and mitigate transmission in our community.  Due to his co-morbid illnesses, this patient is at least at moderate risk for complications without adequate follow up.  This format is felt to be most appropriate for this patient at this time.  The patient did not have access to video technology/had technical difficulties with video requiring transitioning to audio format only (telephone).  All issues noted in this document were discussed and addressed.  No physical exam could be performed with this format.  Please refer to the patient's chart for his  consent to telehealth for [facility].   Evaluation Performed: Lipid clinic evaluation  Date:  03/31/2020   ID:  Brandon [name], DOB 1971/04/07, MRN [id number]  Patient Location:  [address] [place] [address]  Provider location:   [address] [place], [place] [address]  PCP:  Denita Lung, MD  Cardiologist:  [name], MD Electrophysiologist:  None   Chief Complaint:  Manage dyslipidemia  History of Present Illness:    Brandon [name] is a 49 y.o. male who presents via audio/video conferencing for a telehealth visit today.  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  He had a right coronary artery stent and then developed some in-stent restenosis.  He also had an acute coronary syndrome in 2011 with significant progression of coronary disease but no significant obstruction.  More recently, just a few weeks ago he had a TIA event.  He was seen by Dr. Gwenlyn Found just prior to that and recommended to have more aggressive lipid management.  There is a family history of early onset heart disease in his father who also had an MI in his 64s  suggestive of familial hyperlipidemia or perhaps elevated LP(a) as a cause of his symptoms.  This has not been assessed.  Most recently his lipid profile showed total cholesterol 155, triglycerides 152, HDL 32 and LDL 93.  His target LDL is less than 70.  We discussed various options for him however I think the best cardiovascular risk reduction given his recent TIA and recurrent coronary disease and aggressive symptoms would be a PCSK9 inhibitor.  03/31/2020  [name] was seen today for virtual follow-up.  This was via a telephone visit.  Reports feeling fairly well.  He had repeat lipids on 03/25/2020 which demonstrated reduction total cholesterol down to 155, triglycerides 135, HDL 38 and LDL of 93 (previously had been as high as 194).  This is due to the addition of ezetimibe.  He is currently also on high potency statin.  His target LDL is less than 70.  The patient does not have symptoms concerning for COVID-19 infection (fever, chills, cough, or new SHORTNESS OF BREATH).    Prior CV studies:   The following studies were reviewed today:  Chart reviewed  PMHx:  Past Medical History:  Diagnosis Date  . Arteriosclerotic cardiovascular disease (ASCVD) 2006, 2012   2006-acute IMI treated with urgent RCA stent; 2007-Cutting Balloon for in-stent restenosis; 02/2010-presented with ACS and minimal troponin elevation:70% LAD, 80% distal circumflex, 80% proximal ramus branch vessel,in-stent restenosis of 70% in the RCA; BMS for proximal critical RCA stenosis, restenosis Nov 2012  . Carotid artery occlusion   . Heart  attack ([place])   . History of noncompliance with medical treatment    Due to financial considerations  . Hyperlipidemia   . Hypertension   . Stroke ([place])   . Tobacco abuse    40 pack years    Past Surgical History:  Procedure Laterality Date  . 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   Procedure: BYPASS GRAFT CAROTID-SUBCLAVIAN USING HEMASHIELD GOLD GRAFT;  Surgeon: Elam Dutch, MD;  Location: [facility];  Service: Vascular;  Laterality: Left;  . CORONARY ANGIOPLASTY WITH STENT PLACEMENT    . IR ANGIO EXTERNAL CAROTID SEL EXT CAROTID BILAT MOD SED  12/15/2019  . IR ANGIO VERTEBRAL SEL VERTEBRAL UNI R MOD SED  12/15/2019  . IR ANGIOGRAM EXTREMITY LEFT  12/15/2019  . IR ANGIOGRAM EXTREMITY LEFT  12/15/2019  . IR CT HEAD LTD  12/15/2019  . IR PERCUTANEOUS ART THROMBECTOMY/INFUSION INTRACRANIAL INC DIAG ANGIO  12/15/2019  . RADIOLOGY WITH ANESTHESIA N/A 12/15/2019   Procedure: IR WITH ANESTHESIA;  Surgeon: Luanne Bras, MD;  Location: [facility];  Service: Radiology;  Laterality: N/A;    FAMHx:  Family History  Problem Relation Age of Onset  . Depression Mother 38       Suicide  . Heart disease Father 63       Deceased from massive heart-attack  . Hyperlipidemia Father   . Hypertension Father   . COPD Maternal Grandfather   . Cancer Paternal Grandmother        Male Cancer  . Heart disease Paternal Grandmother     SOCHx:   reports that he has been smoking cigarettes. He has a 34.00 pack-year smoking history. He has never used smokeless tobacco. He reports that he does not drink alcohol or use drugs.  ALLERGIES:  No Known Allergies  MEDS:  Current Meds  Medication Sig  . albuterol (VENTOLIN HFA) 108 (90 Base) MCG/ACT inhaler Inhale 2 puffs into the lungs every 6 (six) hours as needed.  Marland Kitchen atorvastatin (LIPITOR) 80 MG tablet Take 1 tablet (80 mg total) by mouth daily.  . clopidogrel (PLAVIX) 75 MG tablet Take 1 tablet (75 mg total) by mouth daily.  Marland Kitchen ezetimibe (ZETIA) 10 MG tablet Take 1 tablet (10 mg total) by mouth daily.  . fluticasone furoate-vilanterol (BREO ELLIPTA) 100-25 MCG/INH AEPB Inhale 1 puff into the lungs daily.  . fluticasone furoate-vilanterol (BREO ELLIPTA) 100-25 MCG/INH AEPB Inhale 1 puff into the lungs daily.     ROS: Pertinent items noted in HPI and remainder of comprehensive ROS otherwise negative.  Labs/Other Tests and Data  Reviewed:    Recent Labs: 02/04/2020: ALT 50 02/19/2020: B Natriuretic Peptide 68.5; BUN 9; Creatinine, Ser 0.89; Hemoglobin 13.9; Platelets 252; Potassium 3.6; Sodium 140   Recent Lipid Panel Lab Results  Component Value Date/Time   CHOL 155 03/25/2020 09:32 AM   TRIG 135 03/25/2020 09:32 AM   HDL 38 (L) 03/25/2020 09:32 AM   CHOLHDL 4.1 03/25/2020 09:32 AM   CHOLHDL 4.8 12/15/2019 03:44 AM   LDLCALC 93 03/25/2020 09:32 AM    Wt Readings from Last 3 Encounters:  03/31/20 (!) 304 lb (137.9 kg)  03/04/20 (!) 304 lb (137.9 kg)  03/02/20 299 lb 3.2 oz (135.7 kg)     Exam:    Vital Signs:  BP (!) 144/93   Pulse 98   Ht 5\' 9"  (1.753 m)   Wt (!) 304 lb (137.9 kg)   BMI 44.89 kg/m    Exam not performed due to telephone visit  ASSESSMENT & PLAN:    1. 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 Strong family history of premature coronary disease 3. Recent TIA 4. History of inferior MI with residual coronary disease and prior right coronary artery stent  Mr. Thivierge remains above target LDL less than 70 despite high potency statin and ezetimibe.  He was previously denied a PCSK9 inhibitor by insurance due to the fact that he was not on ezetimibe however I did not feel that he would reach target LDL less than 70 with that therapy.  Not surprisingly he remains above target LDL less than 70 and therefore will need additional therapy.  We will therefore reapply for a PCSK9 inhibitor to use in addition to ezetimibe and high potency statin to reach his target and further reduce cardiovascular risk.  COVID-19 Education: The signs and symptoms of COVID-19 were discussed with the patient and how to seek care for testing (follow up with PCP or arrange E-visit).  The importance of social distancing was discussed today.  Patient Risk:   After full review of this patients clinical status, I feel that they are at least moderate risk at this time.  Time:    Today, I have spent 25 minutes with the patient with telehealth technology discussing dyslipidemia, TIA, coronary artery disease, family history.     Medication Adjustments/Labs and Tests Ordered: Current medicines are reviewed at length with the patient today.  Concerns regarding medicines are outlined above.   Tests Ordered: No orders of the defined types were placed in this encounter.   Medication Changes: No orders of the defined types were placed in this encounter.   Disposition:  in 3 month(s)  Pixie Casino, MD, [facility], [place] Director of the Advanced Lipid Disorders &  Cardiovascular Risk Reduction Clinic Diplomate of the American Board of Clinical Lipidology Attending Cardiologist  Direct Dial: [phone]  Fax: [phone]  Website:  [email or web address]  Pixie Casino, MD  03/31/2020 3:29 PM

## 2020-04-05 ENCOUNTER — Telehealth: Payer: Self-pay | Admitting: Internal Medicine

## 2020-04-05 NOTE — Telephone Encounter (Signed)
New Message:    Pt's wife called and said she needs [place] to fax her the form so that pt can assistance with his Repatha please. Please fax this to [phone].

## 2020-04-06 NOTE — Telephone Encounter (Signed)
Application printed & faxed per request

## 2020-04-08 ENCOUNTER — Telehealth: Payer: Self-pay | Admitting: Family Medicine

## 2020-04-08 MED ORDER — CLOPIDOGREL BISULFATE 75 MG PO TABS: 75.0000 mg | ORAL_TABLET | Freq: Every day | ORAL | 3 refills | Status: DC

## 2020-04-08 NOTE — Telephone Encounter (Signed)
Wife called pt needs refill Clopidogrel 75 mg qd to [facility]

## 2020-04-12 DIAGNOSIS — Z0271 Encounter for disability determination: Secondary | ICD-10-CM

## 2020-04-27 ENCOUNTER — Telehealth: Payer: Self-pay | Admitting: Family Medicine

## 2020-04-27 ENCOUNTER — Other Ambulatory Visit (HOSPITAL_COMMUNITY)
Admission: RE | Admit: 2020-04-27 | Discharge: 2020-04-27 | Disposition: A | Discharge status: Home / Self Care | Payer: 59 | Source: Ambulatory Visit | Attending: Pulmonary Disease | Admitting: Pulmonary Disease

## 2020-04-27 ENCOUNTER — Other Ambulatory Visit: Payer: Self-pay

## 2020-04-27 DIAGNOSIS — Z01812 Encounter for preprocedural laboratory examination: Secondary | ICD-10-CM | POA: Diagnosis present

## 2020-04-27 DIAGNOSIS — Z20822 Contact with and (suspected) exposure to covid-19: Secondary | ICD-10-CM | POA: Insufficient documentation

## 2020-04-27 LAB — SARS CORONAVIRUS 2 (TAT 6-24 HRS): SARS Coronavirus 2: NEGATIVE

## 2020-04-27 NOTE — Telephone Encounter (Signed)
Pt followed up with pulmonologist for sob, do you still need to see him this Thursday for follow up? Please advise pt wife Debra [phone]

## 2020-04-27 NOTE — Telephone Encounter (Signed)
Left message regarding same.

## 2020-04-27 NOTE — Telephone Encounter (Signed)
Probably not unreasonable to make sure he has gotten all of his questions answered

## 2020-04-29 ENCOUNTER — Encounter: Payer: Self-pay | Admitting: Family Medicine

## 2020-04-29 ENCOUNTER — Other Ambulatory Visit: Payer: Self-pay

## 2020-04-29 ENCOUNTER — Ambulatory Visit (INDEPENDENT_AMBULATORY_CARE_PROVIDER_SITE_OTHER): Payer: 59 | Admitting: Family Medicine

## 2020-04-29 VITALS — BP 138/84 | HR 96 | Temp 98.1°F | Wt 305.8 lb

## 2020-04-29 DIAGNOSIS — Z23 Encounter for immunization: Secondary | ICD-10-CM

## 2020-04-29 DIAGNOSIS — Z9889 Other specified postprocedural states: Secondary | ICD-10-CM

## 2020-04-29 DIAGNOSIS — Z1211 Encounter for screening for malignant neoplasm of colon: Secondary | ICD-10-CM

## 2020-04-29 DIAGNOSIS — I2510 Atherosclerotic heart disease of native coronary artery without angina pectoris: Secondary | ICD-10-CM

## 2020-04-29 NOTE — Telephone Encounter (Signed)
[employer] Safety Net application faxed to [phone] for [place]  Notified patient via [place]

## 2020-04-29 NOTE — Progress Notes (Signed)
   Subjective:    Patient ID: Brandon [name], male    DOB: 1971-02-24, 49 y.o.   MRN: [id number]  HPI He is here for follow-up visit.  He is scheduled to see pulmonary tomorrow.  He does have a questionable history of OSA and was scheduled for study but this is not occurred yet.  He now has insurance.  He is on Zetia as well as Lipitor.  He is being considered for PSK 9.  He seems to doing nicely after having the carotid surgery done.  He admits to being very sedentary and gaining weight over the last several months. Medical record was reviewed concerning immunizations and health maintenance.  Review of Systems     Objective:   Physical Exam Alert and in no distress.  Cardiac exam shows regular rhythm without murmurs or gallops.  Lungs are clear to auscultation.  Left neck surgical scar appears to be healing nicely.       Assessment & Plan:  Arteriosclerotic cardiovascular disease (ASCVD)  Need for Tdap vaccination - Plan: Tdap vaccine greater than or equal to 7yo IM  Screening for colon cancer - Plan: Cologuard  Morbid obesity ([place])  Status post carotid surgery He will follow-up concerning possible OSA with an appointment for tomorrow. Recommend he call Dr. Debara Pickett and let them know he now has insurance to see if that will then qualify him to get PSK 9.  He is also in the process of getting disability. Strongly encouraged him to become more physically active even with short periods of walking and slowly increasing this.  I explained that this would help with his overall health in all areas that and we are now dealing with. Over 30 minutes spent discussing the above issues

## 2020-04-29 NOTE — Patient Instructions (Signed)
Start walking but does do 5 minutes at a time at a pace that is comfortable.  Do this several times per day

## 2020-04-30 ENCOUNTER — Ambulatory Visit: Payer: 59 | Admitting: Primary Care

## 2020-04-30 ENCOUNTER — Ambulatory Visit (INDEPENDENT_AMBULATORY_CARE_PROVIDER_SITE_OTHER): Payer: 59 | Admitting: Pulmonary Disease

## 2020-04-30 ENCOUNTER — Telehealth: Payer: Self-pay | Admitting: Internal Medicine

## 2020-04-30 ENCOUNTER — Encounter: Payer: Self-pay | Admitting: Primary Care

## 2020-04-30 VITALS — BP 137/80 | HR 88 | Temp 97.6°F | Ht 73.0 in | Wt 303.0 lb

## 2020-04-30 DIAGNOSIS — R0602 Shortness of breath: Secondary | ICD-10-CM

## 2020-04-30 DIAGNOSIS — R0683 Snoring: Secondary | ICD-10-CM | POA: Diagnosis not present

## 2020-04-30 DIAGNOSIS — Z720 Tobacco use: Secondary | ICD-10-CM | POA: Diagnosis not present

## 2020-04-30 DIAGNOSIS — J984 Other disorders of lung: Secondary | ICD-10-CM | POA: Insufficient documentation

## 2020-04-30 DIAGNOSIS — G4733 Obstructive sleep apnea (adult) (pediatric): Secondary | ICD-10-CM | POA: Insufficient documentation

## 2020-04-30 LAB — PULMONARY FUNCTION TEST
DL/VA % pred: 119 %
DL/VA: 5.23 ml/min/mmHg/L
DLCO cor % pred: 75 %
DLCO cor: 22.78 ml/min/mmHg
DLCO unc % pred: 75 %
DLCO unc: 22.78 ml/min/mmHg
FEF 25-75 Post: 2.6 L/sec
FEF 25-75 Pre: 3.37 L/sec
FEF2575-%Change-Post: -23 %
FEF2575-%Pred-Post: 73 %
FEF2575-%Pred-Pre: 95 %
FEV1-%Change-Post: -7 %
FEV1-%Pred-Post: 57 %
FEV1-%Pred-Pre: 62 %
FEV1-Post: 2.32 L
FEV1-Pre: 2.52 L
FEV1FVC-%Change-Post: 0 %
FEV1FVC-%Pred-Pre: 114 %
FEV6-%Change-Post: -7 %
FEV6-%Pred-Post: 52 %
FEV6-%Pred-Pre: 56 %
FEV6-Post: 2.64 L
FEV6-Pre: 2.85 L
FEV6FVC-%Pred-Post: 103 %
FEV6FVC-%Pred-Pre: 103 %
FVC-%Change-Post: -7 %
FVC-%Pred-Post: 50 %
FVC-%Pred-Pre: 54 %
FVC-Post: 2.64 L
FVC-Pre: 2.85 L
Post FEV1/FVC ratio: 88 %
Post FEV6/FVC ratio: 100 %
Pre FEV1/FVC ratio: 88 %
Pre FEV6/FVC Ratio: 100 %
RV % pred: 100 %
RV: 2.11 L
TLC % pred: 68 %
TLC: 4.95 L

## 2020-04-30 LAB — NITRIC OXIDE: Nitric Oxide: <5

## 2020-04-30 MED ORDER — REPATHA SURECLICK 140 MG/ML ~~LOC~~ SOAJ: 1.0000 | SUBCUTANEOUS | 11 refills | Status: DC

## 2020-04-30 NOTE — Telephone Encounter (Signed)
Follow up   Patient's wife  would like a call to discuss the repatha form. Please call.

## 2020-04-30 NOTE — Patient Instructions (Addendum)
Recommendations: Continue Breo 100 one puff daily (rinse mouth after use) Encourage you taper the amount you smoker  Work on weight loss efforts   Orders: FENO re: sob/eosinophilia   Referral: Healthy weight and wellness re: BMI 39  Follow-up: 4 months with Dr. Ander Slade    Steps to Quit Smoking Smoking tobacco is the leading cause of preventable death. It can affect almost every organ in the body. Smoking puts you and people around you at risk for many serious, long-lasting (chronic) diseases. Quitting smoking can be hard, but it is one of the best things that you can do for your health. It is never too late to quit. How do I get ready to quit? When you decide to quit smoking, make a plan to help you succeed. Before you quit:  Pick a date to quit. Set a date within the next 2 weeks to give you time to prepare.  Write down the reasons why you are quitting. Keep this list in places where you will see it often.  Tell your family, friends, and co-workers that you are quitting. Their support is important.  Talk with your doctor about the choices that may help you quit.  Find out if your health insurance will pay for these treatments.  Know the people, places, things, and activities that make you want to smoke (triggers). Avoid them. What first steps can I take to quit smoking?  Throw away all cigarettes at home, at work, and in your car.  Throw away the things that you use when you smoke, such as ashtrays and lighters.  Clean your car. Make sure to empty the ashtray.  Clean your home, including curtains and carpets. What can I do to help me quit smoking? Talk with your doctor about taking medicines and seeing a counselor at the same time. You are more likely to succeed when you do both.  If you are pregnant or breastfeeding, talk with your doctor about counseling or other ways to quit smoking. Do not take medicine to help you quit smoking unless your doctor tells you to do  so. To quit smoking: Quit right away  Quit smoking totally, instead of slowly cutting back on how much you smoke over a period of time.  Go to counseling. You are more likely to quit if you go to counseling sessions regularly. Take medicine You may take medicines to help you quit. Some medicines need a prescription, and some you can buy over-the-counter. Some medicines may contain a drug called nicotine to replace the nicotine in cigarettes. Medicines may:  Help you to stop having the desire to smoke (cravings).  Help to stop the problems that come when you stop smoking (withdrawal symptoms). Your doctor may ask you to use:  Nicotine patches, gum, or lozenges.  Nicotine inhalers or sprays.  Non-nicotine medicine that is taken by mouth. Find resources Find resources and other ways to help you quit smoking and remain smoke-free after you quit. These resources are most helpful when you use them often. They include:  Online chats with a Social worker.  Phone quitlines.  Printed Furniture conservator/restorer.  Support groups or group counseling.  Text messaging programs.  Mobile phone apps. Use apps on your mobile phone or tablet that can help you stick to your quit plan. There are many free apps for mobile phones and tablets as well as websites. Examples include Quit Guide from the [employer] and smokefree.gov  What things can I do to make it easier to quit?  Talk to your family and friends. Ask them to support and encourage you.  Call a phone quitline (1-800-QUIT-NOW), reach out to support groups, or work with a Social worker.  Ask people who smoke to not smoke around you.  Avoid places that make you want to smoke, such as: ? Bars. ? Parties. ? Smoke-break areas at work.  Spend time with people who do not smoke.  Lower the stress in your life. Stress can make you want to smoke. Try these things to help your stress: ? Getting regular exercise. ? Doing deep-breathing exercises. ? Doing  yoga. ? Meditating. ? Doing a body scan. To do this, close your eyes, focus on one area of your body at a time from head to toe. Notice which parts of your body are tense. Try to relax the muscles in those areas. How will I feel when I quit smoking? Day 1 to 3 weeks Within the first 24 hours, you may start to have some problems that come from quitting tobacco. These problems are very bad 2-3 days after you quit, but they do not often last for more than 2-3 weeks. You may get these symptoms:  Mood swings.  Feeling restless, nervous, angry, or annoyed.  Trouble concentrating.  Dizziness.  Strong desire for high-sugar foods and nicotine.  Weight gain.  Trouble pooping (constipation).  Feeling like you may vomit (nausea).  Coughing or a sore throat.  Changes in how the medicines that you take for other issues work in your body.  Depression.  Trouble sleeping (insomnia). Week 3 and afterward After the first 2-3 weeks of quitting, you may start to notice more positive results, such as:  Better sense of smell and taste.  Less coughing and sore throat.  Slower heart rate.  Lower blood pressure.  Clearer skin.  Better breathing.  Fewer sick days. Quitting smoking can be hard. Do not give up if you fail the first time. Some people need to try a few times before they succeed. Do your best to stick to your quit plan, and talk with your doctor if you have any questions or concerns. Summary  Smoking tobacco is the leading cause of preventable death. Quitting smoking can be hard, but it is one of the best things that you can do for your health.  When you decide to quit smoking, make a plan to help you succeed.  Quit smoking right away, not slowly over a period of time.  When you start quitting, seek help from your doctor, family, or friends. This information is not intended to replace advice given to you by your health care provider. Make sure you discuss any questions you  have with your health care provider. Document Revised: 07/04/2019 Document Reviewed: 12/28/2018 Elsevier Patient Education  [place].

## 2020-04-30 NOTE — Telephone Encounter (Signed)
PA for Repatha [employer] submitted via CMM (Key: [id number])

## 2020-04-30 NOTE — Assessment & Plan Note (Signed)
-   Current 1ppd smoker, he is not ready to quit but is thinking about it  - Discussed tapering amount and when ready picking a quit date

## 2020-04-30 NOTE — Telephone Encounter (Signed)
New message ° ° ° ° °

## 2020-04-30 NOTE — Telephone Encounter (Addendum)
Spoke with patient's wife who reports patient now has insurance thru [employer], so his [employer] application will be invalid - will call [employer] about this.   Since patient has a non-medicare plan, he should be able to use a co-pay card. Advised wife how to obtain one online, youtube link for repatha administration sent via MyChart, Rx sent to [facility]

## 2020-04-30 NOTE — Progress Notes (Signed)
Make sure he gets set up for a sleep study

## 2020-04-30 NOTE — Progress Notes (Signed)
Full PFT performed today. °

## 2020-04-30 NOTE — Assessment & Plan Note (Addendum)
-   Hx sleep apnea, reports loud snoring. Not on CPAP therapy  - Needs HST with Dr. Ander Slade

## 2020-04-30 NOTE — Addendum Note (Signed)
Addended by: Fidel Levy on: 04/30/2020 01:35 PM   Modules accepted: Orders

## 2020-04-30 NOTE — Assessment & Plan Note (Signed)
-   Weight contributing to shortness of breath  - Refer to healthy weight and wellness

## 2020-04-30 NOTE — Progress Notes (Signed)
@Patient  ID: Brandon [name], male    DOB: January 19, 1971, 49 y.o.   MRN: [id number]  Chief Complaint  Patient presents with  . Follow-up    SOB is the same, no coughing    Referring provider: Denita Lung, MD  HPI: 49 year old male, current every day smoker (34-pack-year history).  Past medical history significant for hypertension, thoracic aortic aneurysm without rupture, arteriosclerotic cardiovascular disease, sleep apnea, tobacco use.  Patient of Dr. Ander Slade, seen for initial consult in May 2021 for shortness of breath.  Started on [place] 10. Patient ordered for pulmonary function testing.   04/30/2020 Patient presents today for 70-month follow-up. He had LLL pneumonia in late April. States that his shortness of breath is not as bad. He is taking Breo Ellipta daily as prescribed. He has noticed some improvement with this inhaler but not an overwhelming difference. He is still smoking 1ppd. He is not ready to quit but is thinking about it. He reports getting bronchitis every other year in the fall. States that he has put on a lot of weight over the last year. He is considering going on a 1,200cal diet. He reports loud snoring at night. He has not had sleep test yet. BNP normal in April. Eosinophils 400 in February 2021. Denies cough, chest tightness or wheezing.    Pulmonary function testing: 04/30/2020-FVC 2.64 (50%), FEV1 2.32 (57%), ratio 88, TLC 68%, DLCOcor 22.78 (75%) Interpretation: Moderate to severe restriction with no overt obstruction. No BD response. Mild diffusion defect  No Known Allergies  Immunization History  Administered Date(s) Administered  . Influenza Whole 06/27/2011  . Influenza,inj,Quad PF,6+ Mos 06/19/2017  . PFIZER SARS-COV-2 Vaccination 01/15/2020, 02/07/2020  . Tdap 04/29/2020    Past Medical History:  Diagnosis Date  . Arteriosclerotic cardiovascular disease (ASCVD) 2006, 2012   2006-acute IMI treated with urgent RCA stent; 2007-[place]  for in-stent restenosis; 02/2010-presented with ACS and minimal troponin elevation:70% LAD, 80% distal circumflex, 80% proximal ramus branch vessel,in-stent restenosis of 70% in the RCA; BMS for proximal critical RCA stenosis, restenosis Nov 2012  . Carotid artery occlusion   . Heart attack ([place])   . History of noncompliance with medical treatment    Due to financial considerations  . Hyperlipidemia   . Hypertension   . Stroke ([place])   . Tobacco abuse    40 pack years    Tobacco History: Social History   Tobacco Use  Smoking Status Current Every Day Smoker  . Packs/day: 1.00  . Years: 34.00  . Pack years: 34.00  . Types: Cigarettes  Smokeless Tobacco Never Used   Ready to quit: No Counseling given: Yes   Outpatient Medications Prior to Visit  Medication Sig Dispense Refill  . albuterol (VENTOLIN HFA) 108 (90 Base) MCG/ACT inhaler Inhale 2 puffs into the lungs every 6 (six) hours as needed. 18 g 5  . atorvastatin (LIPITOR) 80 MG tablet Take 1 tablet (80 mg total) by mouth daily. 90 tablet 3  . clopidogrel (PLAVIX) 75 MG tablet Take 1 tablet (75 mg total) by mouth daily. 90 tablet 3  . fluticasone furoate-vilanterol (BREO ELLIPTA) 100-25 MCG/INH AEPB Inhale 1 puff into the lungs daily. 28 each 0  . ezetimibe (ZETIA) 10 MG tablet Take 1 tablet (10 mg total) by mouth daily. 90 tablet 3   No facility-administered medications prior to visit.   Review of Systems  Review of Systems  Constitutional: Negative.   Respiratory: Positive for shortness of breath. Negative for cough and  wheezing.     Physical Exam  BP 137/80 (BP Location: Left Arm, Cuff Size: Normal)   Pulse 88   Temp 97.6 F (36.4 C) (Oral)   Ht 6\' 1"  (1.854 m)   Wt (!) 303 lb (137.4 kg)   SpO2 95%   BMI 39.98 kg/m  Physical Exam Constitutional:      Appearance: Normal appearance.  HENT:     Head: Normocephalic and atraumatic.  Cardiovascular:     Rate and Rhythm: Normal rate and regular rhythm.    Pulmonary:     Effort: Pulmonary effort is normal.     Breath sounds: Normal breath sounds.     Comments: Clear Neurological:     General: No focal deficit present.     Mental Status: He is alert and oriented to person, place, and time. Mental status is at baseline.  Psychiatric:        Mood and Affect: Mood normal.        Behavior: Behavior normal.        Thought Content: Thought content normal.        Judgment: Judgment normal.      Lab Results:  CBC    Component Value Date/Time   WBC 10.5 02/19/2020 1356   RBC 4.46 02/19/2020 1356   HGB 13.9 02/19/2020 1356   HGB 16.5 10/31/2019 1432   HCT 40.9 02/19/2020 1356   HCT 47.5 10/31/2019 1432   PLT 252 02/19/2020 1356   PLT 172 10/31/2019 1432   MCV 91.7 02/19/2020 1356   MCV 92 10/31/2019 1432   MCH 31.2 02/19/2020 1356   MCHC 34.0 02/19/2020 1356   RDW 13.2 02/19/2020 1356   RDW 13.5 10/31/2019 1432   LYMPHSABS 4.3 (H) 12/15/2019 0357   LYMPHSABS 3.5 (H) 10/31/2019 1432   MONOABS 0.6 12/15/2019 0357   EOSABS 0.4 12/15/2019 0357   EOSABS 0.4 10/31/2019 1432   BASOSABS 0.2 (H) 12/15/2019 0357   BASOSABS 0.1 10/31/2019 1432    BMET    Component Value Date/Time   NA 140 02/19/2020 1356   NA 143 10/31/2019 1432   K 3.6 02/19/2020 1356   CL 103 02/19/2020 1356   CO2 25 02/19/2020 1356   GLUCOSE 149 (H) 02/19/2020 1356   BUN 9 02/19/2020 1356   BUN 16 10/31/2019 1432   CREATININE 0.89 02/19/2020 1356   CREATININE 0.92 06/19/2017 1004   CALCIUM 9.0 02/19/2020 1356   GFRNONAA >60 02/19/2020 1356   GFRAA >60 02/19/2020 1356    BNP    Component Value Date/Time   BNP 68.5 02/19/2020 1414    ProBNP No results found for: PROBNP  Imaging: No results found.   Assessment & Plan:   Restrictive lung disease - PFTs today showed moderately severe restrictive lung disease. No overt obstruction and no reversibility. Restriction likely d/t body hibitus. Symptoms not consistent with asthma. He can continue BREO  for now but if patient does not have sustained clinical benefit from ICS/LABA he can discontinue use.  - Strongly encourage weight loss efforts and smoking cessation   Tobacco abuse - Current 1ppd smoker, he is not ready to quit but is thinking about it  - Discussed tapering amount and when ready picking a quit date  Snoring - Hx sleep apnea, reports loud snoring. Not on CPAP therapy  - Needs HST with Dr. Ander Slade  Morbid obesity ([place]) - Weight contributing to shortness of breath  - Refer to healthy weight and wellness      Benjamine Mola  Teresita Madura, NP 04/30/2020

## 2020-04-30 NOTE — Assessment & Plan Note (Addendum)
-   PFTs today showed moderately severe restrictive lung disease. No overt obstruction and no reversibility. Restriction likely d/t body hibitus. Symptoms not consistent with asthma. He can continue BREO for now but if patient does not have sustained clinical benefit from ICS/LABA he can discontinue use.  - Strongly encourage weight loss efforts and smoking cessation

## 2020-04-30 NOTE — Addendum Note (Signed)
Addended by: Coralie Keens on: 04/30/2020 05:16 PM   Modules accepted: Orders

## 2020-05-03 ENCOUNTER — Other Ambulatory Visit: Payer: Self-pay

## 2020-05-03 MED ORDER — REPATHA SURECLICK 140 MG/ML ~~LOC~~ SOAJ: 1.0000 | SUBCUTANEOUS | 11 refills | Status: DC

## 2020-05-03 NOTE — Telephone Encounter (Signed)
PA for repatha denied. Appeal initiated via [facility] and submitted

## 2020-05-04 NOTE — Telephone Encounter (Signed)
Received fax from [facility] that patient has been approved for Repatha from 05/04/2020 - 05/04/2021  Notified patient via [place]

## 2020-05-05 ENCOUNTER — Telehealth: Payer: Self-pay | Admitting: Primary Care

## 2020-05-05 ENCOUNTER — Ambulatory Visit (HOSPITAL_COMMUNITY)
Admission: RE | Admit: 2020-05-05 | Discharge: 2020-05-05 | Disposition: A | Discharge status: Home / Self Care | Payer: 59 | Source: Ambulatory Visit | Attending: Primary Care | Admitting: Primary Care

## 2020-05-05 ENCOUNTER — Other Ambulatory Visit: Payer: Self-pay

## 2020-05-05 DIAGNOSIS — J984 Other disorders of lung: Secondary | ICD-10-CM | POA: Diagnosis not present

## 2020-05-05 DIAGNOSIS — R0683 Snoring: Secondary | ICD-10-CM

## 2020-05-05 DIAGNOSIS — G4733 Obstructive sleep apnea (adult) (pediatric): Secondary | ICD-10-CM

## 2020-05-05 NOTE — Progress Notes (Signed)
Please let patient know CXR showed resolution of left lung pneumonia.   His HST was denied we will place an order for in-lab study. If this isnt covered we will need to appeal this

## 2020-05-05 NOTE — Telephone Encounter (Signed)
Please order in lab split-night sleep study re: snoring, sleep apnea

## 2020-05-05 NOTE — Telephone Encounter (Signed)
Patient updated on new order.

## 2020-05-05 NOTE — Telephone Encounter (Signed)
[facility] called and stated that Case # [id number] request for HST procedure code [id number] has been denied by insurance.  Denial was faxed to [phone] as to the reason.   Just an FYI for provider. Rhonda J Cobb

## 2020-05-19 ENCOUNTER — Ambulatory Visit (INDEPENDENT_AMBULATORY_CARE_PROVIDER_SITE_OTHER): Payer: 59 | Admitting: Acute Care

## 2020-05-19 ENCOUNTER — Other Ambulatory Visit: Payer: Self-pay

## 2020-05-19 ENCOUNTER — Encounter: Payer: Self-pay | Admitting: Acute Care

## 2020-05-19 DIAGNOSIS — G4733 Obstructive sleep apnea (adult) (pediatric): Secondary | ICD-10-CM

## 2020-05-19 NOTE — Progress Notes (Signed)
Peer to Peer was completed with Dr. Arville Lime . He states he will approve a home sleep study. He states he will fax the approval paperwork to the office today or tomorrow.

## 2020-05-25 ENCOUNTER — Telehealth: Payer: Self-pay | Admitting: *Deleted

## 2020-05-25 NOTE — Telephone Encounter (Signed)
error 

## 2020-05-26 ENCOUNTER — Other Ambulatory Visit: Payer: Self-pay

## 2020-05-26 ENCOUNTER — Ambulatory Visit: Payer: 59 | Admitting: Pulmonary Disease

## 2020-05-26 DIAGNOSIS — R0683 Snoring: Secondary | ICD-10-CM

## 2020-05-28 ENCOUNTER — Encounter: Payer: 59 | Admitting: Pulmonary Disease

## 2020-05-28 LAB — COLOGUARD: Cologuard: POSITIVE — AB

## 2020-06-01 ENCOUNTER — Encounter: Payer: Self-pay | Admitting: Cardiovascular Disease

## 2020-06-01 ENCOUNTER — Ambulatory Visit (INDEPENDENT_AMBULATORY_CARE_PROVIDER_SITE_OTHER): Payer: 59 | Admitting: Cardiovascular Disease

## 2020-06-01 ENCOUNTER — Other Ambulatory Visit: Payer: Self-pay

## 2020-06-01 VITALS — BP 138/72 | HR 95 | Ht 69.0 in | Wt 306.0 lb

## 2020-06-01 DIAGNOSIS — E782 Mixed hyperlipidemia: Secondary | ICD-10-CM

## 2020-06-01 DIAGNOSIS — G458 Other transient cerebral ischemic attacks and related syndromes: Secondary | ICD-10-CM

## 2020-06-01 DIAGNOSIS — Z720 Tobacco use: Secondary | ICD-10-CM

## 2020-06-01 DIAGNOSIS — E785 Hyperlipidemia, unspecified: Secondary | ICD-10-CM | POA: Diagnosis not present

## 2020-06-01 DIAGNOSIS — I10 Essential (primary) hypertension: Secondary | ICD-10-CM | POA: Diagnosis not present

## 2020-06-01 DIAGNOSIS — I6522 Occlusion and stenosis of left carotid artery: Secondary | ICD-10-CM

## 2020-06-01 DIAGNOSIS — I2510 Atherosclerotic heart disease of native coronary artery without angina pectoris: Secondary | ICD-10-CM | POA: Diagnosis not present

## 2020-06-01 DIAGNOSIS — R0683 Snoring: Secondary | ICD-10-CM

## 2020-06-01 NOTE — Assessment & Plan Note (Signed)
History of nocturnal snoring and daytime somnolence being evaluated by his pulmonologist for obstructive sleep apnea.Marland Kitchen  He recently had a home sleep study.

## 2020-06-01 NOTE — Assessment & Plan Note (Signed)
Ongoing tobacco abuse of 1 pack/day recalcitrant risk factor modification.

## 2020-06-01 NOTE — Assessment & Plan Note (Signed)
History of hyperlipidemia currently on high-dose atorvastatin, Zetia and Repatha.  We will recheck a lipid liver profile in 3 months.

## 2020-06-01 NOTE — Assessment & Plan Note (Signed)
History of carotid artery disease with known occlusion of his left internal carotid artery and no significant right ICA stenosis.  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  Follow-up Dopplers in February.

## 2020-06-01 NOTE — Progress Notes (Signed)
06/01/2020 Brandon [name]   [birth year]  [id number]  Primary Physician Denita Lung, MD Primary Cardiologist: Lorretta Harp MD Lupe Carney, [place]  HPI:  Brandon [name] is a 49 y.o.  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  He was initially referred by Dr. Redmond School because of lower extremity edema which has since resolved after changing jobs.  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 His risk factors include 1-1/2 packs a day of tobacco abuse having smoked 35 years as well as treated hypertension and hyperlipidemia. His father had a heart attack at age 58 and died. 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 He was recathed here later found to have in-stent restenosis as well as recently intervened on. I Him 02/24/10 the setting of a non-STEMI revealed revealing a new 75% in-stent restenosis within the RCA stent and a new 99% proximal lesion both of which were intervened on. He's had no problems since.  I have not seen him for close to 2 years. He did run out of his statin drug for 6 months and had a lipid profile done a month ago revealing total cholesterol 276, LDL 194 and HDL of 37. He apparently has been put back on a statin drug. He denies chest pain or shortness of breath. He does continue to smoke a pack a day however.  Since I saw him in the office 2 weeks ago he did have a follow-up lipid profile performed 12/04/2019 revealing a total cholesterol 215, LDL 146 and HDL 42 on atorvastatin 80 mg a day.  He is clearly not at goal for secondary prevention and we discussed starting Repatha.  In addition, he did have carotid Doppler studies because of an auscultated bruit that revealed a  total left carotid with normal right carotid artery.  He is complaining of some right upper extremity numbness.  Is unclear whether this is radicular or neurologic from his carotid disease and he probably would benefit from seeing a neurologist.  We talked about risk factor modification including smoking cessation.  He still smokes 1 pack/day.  Since I saw him back 6 months ago he did have an unsuccessful attempt at left common carotid and left subclavian artery stenting by interventional radiology.  He has started Repatha.  He continues to smoke a pack a day.  He denies chest pain or shortness of breath.   Current Meds  Medication Sig  . albuterol (VENTOLIN HFA) 108 (90 Base) MCG/ACT inhaler Inhale 2 puffs into the lungs every 6 (six) hours as needed.  Marland Kitchen atorvastatin (LIPITOR) 80 MG tablet Take 1 tablet (80 mg total) by mouth daily.  . clopidogrel (PLAVIX) 75 MG tablet Take 1 tablet (75 mg total) by mouth daily.  . Evolocumab (REPATHA SURECLICK) 053 MG/ML SOAJ Inject 1 Dose into the skin every 14 (fourteen) days.  Marland Kitchen ezetimibe (ZETIA) 10 MG tablet Take 1 tablet (10 mg total) by mouth daily.  . fluticasone furoate-vilanterol (BREO ELLIPTA) 100-25 MCG/INH AEPB Inhale 1 puff into the lungs daily.     No Known Allergies  Social History   Socioeconomic History  . Marital status: Married    Spouse name: Not on  file  . Number of children: 2  . Years of education: Not on file  . Highest education level: Not on file  Occupational History  . 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 Smoking status: Current Every Day Smoker    Packs/day: 1.00    Years: 34.00    Pack years: 34.00    Types: Cigarettes  . Smokeless tobacco: Never Used  Vaping Use  . Vaping Use: Never used  Substance and Sexual Activity  . Alcohol use: No    Comment:  Alcoholism- quit 2002  . Drug use: No  . Sexual activity: Not on file  Other Topics Concern  . Not  on file  Social History Narrative            Social Determinants of Health   Financial Resource Strain:   . Difficulty of Paying Living Expenses:   Food Insecurity:   . Worried About Charity fundraiser in the Last Year:   . Arboriculturist in the Last Year:   Transportation Needs:   . Film/video editor (Medical):   Marland Kitchen Lack of Transportation (Non-Medical):   Physical Activity:   . Days of Exercise per Week:   . Minutes of Exercise per Session:   Stress:   . Feeling of Stress :   Social Connections:   . Frequency of Communication with Friends and Family:   . Frequency of Social Gatherings with Friends and Family:   . Attends Religious Services:   . Active Member of Clubs or Organizations:   . Attends Archivist Meetings:   Marland Kitchen Marital Status:   Intimate Partner Violence:   . Fear of Current or Ex-Partner:   . Emotionally Abused:   Marland Kitchen Physically Abused:   . Sexually Abused:      Review of Systems: General: negative for chills, fever, night sweats or weight changes.  Cardiovascular: negative for chest pain, dyspnea on exertion, edema, orthopnea, palpitations, paroxysmal nocturnal dyspnea or shortness of breath Dermatological: negative for rash Respiratory: negative for cough or wheezing Urologic: negative for hematuria Abdominal: negative for nausea, vomiting, diarrhea, bright red blood per rectum, melena, or hematemesis Neurologic: negative for visual changes, syncope, or dizziness All other systems reviewed and are otherwise negative except as noted above.    Blood pressure 138/72, pulse 95, height 5\' 9"  (1.753 m), weight (!) 306 lb (138.8 kg), SpO2 94 %.  General appearance: alert and no distress Neck: no adenopathy, no carotid bruit, no JVD, supple, symmetrical, trachea midline and thyroid not enlarged, symmetric, no tenderness/mass/nodules Lungs: clear to auscultation bilaterally Heart: regular rate and rhythm, S1, S2 normal, no murmur, click, rub or  gallop Extremities: extremities normal, atraumatic, no cyanosis or edema Pulses: 2+ and symmetric Skin: Skin color, texture, turgor normal. No rashes or lesions Neurologic: Alert and oriented X 3, normal strength and tone. Normal symmetric reflexes. Normal coordination and gait  EKG not performed today  ASSESSMENT AND PLAN:   Hyperlipidemia History of hyperlipidemia currently on high-dose atorvastatin, Zetia and Repatha.  We will recheck a lipid liver profile in 3 months.  Hypertension History of essential hypertension blood pressure measured today 138/72.  He is not on antihypertensive medications.  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  He underwent repeat catheterization year later and was found to have in-stent restenosis and underwent Cutting  Balloon atherectomy.  I performed cardiac catheterization on him 02/24/2010 in the setting of non-STEMI revealing a new 75% in-stent restenosis in the RCA stent as well as a new 99% proximal lesion both of which were intervened on.  He currently denies chest pain or shortness of breath.  Tobacco abuse Ongoing tobacco abuse of 1 pack/day recalcitrant risk factor modification.  Snoring History of nocturnal snoring and daytime somnolence being evaluated by his pulmonologist for obstructive sleep apnea.Marland Kitchen  He recently had a home sleep study.  Internal carotid artery occlusion, left History of carotid artery disease with known occlusion of his left internal carotid artery and no significant right ICA stenosis.  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  Follow-up Dopplers in February.  Subclavian steal syndrome Unsuccessful attempt by interventional radiology recanalizing a occluded left subclavian artery 12/15/2019.      Lorretta Harp MD FACP,FACC,FAHA, [facility] 06/01/2020 2:55 PM

## 2020-06-01 NOTE — Assessment & Plan Note (Signed)
History of CAD status post inferior wall myocardial infarction back in 2000 thousand and 6 treated with RCA stenting.  He underwent repeat catheterization year later and was found to have in-stent restenosis and underwent Cutting Balloon atherectomy.  I performed cardiac catheterization on him 02/24/2010 in the setting of non-STEMI revealing a new 75% in-stent restenosis in the RCA stent as well as a new 99% proximal lesion both of which were intervened on.  He currently denies chest pain or shortness of breath.

## 2020-06-01 NOTE — Patient Instructions (Signed)
Medication Instructions:  Your physician recommends that you continue on your current medications as directed. Please refer to the Current Medication list given to you today.  *If you need a refill on your cardiac medications before your next appointment, please call your pharmacy*   Lab Work: FASTING LABS IN 3 MONTHS (Lipid/Hepatic)  If you have labs (blood work) drawn today and your tests are completely normal, you will receive your results only by: Marland Kitchen MyChart Message (if you have MyChart) OR . A paper copy in the mail If you have any lab test that is abnormal or we need to change your treatment, we will call you to review the results.   Testing/Procedures: Your physician has requested that you have a carotid duplex in February 2022. This test is an ultrasound of the carotid arteries in your neck. It looks at blood flow through these arteries that supply the brain with blood. Allow one hour for this exam. There are no restrictions or special instructions.   Follow-Up: At [facility], you and your health needs are our priority.  As part of our continuing mission to provide you with exceptional heart care, we have created designated Provider Care Teams.  These Care Teams include your primary Cardiologist (physician) and Advanced Practice Providers (APPs -  Physician Assistants and Nurse Practitioners) who all work together to provide you with the care you need, when you need it.  We recommend signing up for the patient portal called "MyChart".  Sign up information is provided on this After Visit Summary.  MyChart is used to connect with patients for Virtual Visits (Telemedicine).  Patients are able to view lab/test results, encounter notes, upcoming appointments, etc.  Non-urgent messages can be sent to your provider as well.   To learn more about what you can do with MyChart, go to [email or web address].    Your next appointment:   12 month(s)  The format for your next appointment:    In Person  Provider:   [name], MD

## 2020-06-01 NOTE — Assessment & Plan Note (Signed)
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

## 2020-06-01 NOTE — Assessment & Plan Note (Signed)
History of essential hypertension blood pressure measured today 138/72.  He is not on antihypertensive medications.

## 2020-06-02 ENCOUNTER — Telehealth: Payer: Self-pay | Admitting: Pulmonary Disease

## 2020-06-02 DIAGNOSIS — G4733 Obstructive sleep apnea (adult) (pediatric): Secondary | ICD-10-CM

## 2020-06-02 NOTE — Telephone Encounter (Signed)
I do not see any results either. If he completed split night study it likely has not been read yet by provider. Can take 2-4 weeks.   CC: Dr. Ander Slade

## 2020-06-02 NOTE — Telephone Encounter (Signed)
Called and spoke with pt letting him know the info stated by [facility] and stated to him that we have also sent this to AO for her to review. Pt verbalized understanding. Will await to hear from AO.

## 2020-06-02 NOTE — Telephone Encounter (Signed)
Called and spoke with Patient's wife, and Patient.  They were requesting sleep results from 05/28/20. Patient stated he completed a sleep study, but was unsure if he did it right. HST was ordered by Eustaquio Maize, NP 7/9 and split night was ordered 05/05/20.  I do not see any results.  Message routed to [place], NP

## 2020-06-08 ENCOUNTER — Other Ambulatory Visit: Payer: Self-pay

## 2020-06-08 DIAGNOSIS — R195 Other fecal abnormalities: Secondary | ICD-10-CM

## 2020-06-08 DIAGNOSIS — Z1211 Encounter for screening for malignant neoplasm of colon: Secondary | ICD-10-CM

## 2020-06-08 NOTE — Progress Notes (Signed)
Pt was advised of positive cologuard . KH 

## 2020-06-08 NOTE — Progress Notes (Signed)
ambu

## 2020-06-09 ENCOUNTER — Encounter: Payer: Self-pay | Admitting: Family Medicine

## 2020-06-14 NOTE — Telephone Encounter (Signed)
I will send to [facility] his nurse to follow up on

## 2020-06-16 NOTE — Telephone Encounter (Signed)
HST showed severe OSA CPAP titration recommended due tos everity of desaturations Given to stephanie

## 2020-06-16 NOTE — Telephone Encounter (Signed)
Dr. Elsworth Soho, do you have results for any sleep studies on this patient? Please advise.

## 2020-06-16 NOTE — Telephone Encounter (Signed)
LMTCB

## 2020-06-21 NOTE — Telephone Encounter (Signed)
Pt wife calling back - she can be reached at [phone]

## 2020-06-21 NOTE — Telephone Encounter (Signed)
Called and spoke with pt's wife letting her know the results of the HST and that we were going to order cpap titration study due to results of HST. She verbalized understanding. Order placed for the cpap titration. Nothing further needed.

## 2020-07-13 ENCOUNTER — Encounter: Payer: Self-pay | Admitting: Gastroenterology

## 2020-07-15 ENCOUNTER — Other Ambulatory Visit: Payer: Self-pay

## 2020-07-15 ENCOUNTER — Ambulatory Visit: Payer: 59 | Attending: Pulmonary Disease | Admitting: Pulmonary Disease

## 2020-07-15 DIAGNOSIS — R0902 Hypoxemia: Secondary | ICD-10-CM | POA: Diagnosis not present

## 2020-07-15 DIAGNOSIS — G4733 Obstructive sleep apnea (adult) (pediatric): Secondary | ICD-10-CM | POA: Diagnosis not present

## 2020-07-19 ENCOUNTER — Telehealth: Payer: Self-pay | Admitting: Pulmonary Disease

## 2020-07-19 NOTE — Procedures (Signed)
Patient Name: Brandon [name], Brandon [name] Date: 07/15/2020 Gender: Male D.O.B: 14-Nov-1970 Age (years): 20 Referring Provider: Geraldo Pitter NP Height (inches): 69 Interpreting Physician: Kara Mead MD, ABSM Weight (lbs): 306 RPSGT: Peak, Robert BMI: 45 MRN: [id number] Neck Size: 20.50 <br> <br> CLINICAL INFORMATION The patient is referred for a BiPAP titration to treat sleep apnea.    Date of HST: 05/2020 , severe OSA AHI 67/h, centrals 8/h  SLEEP STUDY TECHNIQUE As per the AASM Manual for the Scoring of Sleep and Associated Events v2.3 (April 2016) with a hypopnea requiring 4% desaturations.  The channels recorded and monitored were frontal, central and occipital EEG, electrooculogram (EOG), submentalis EMG (chin), nasal and oral airflow, thoracic and abdominal wall motion, anterior tibialis EMG, snore microphone, electrocardiogram, and pulse oximetry. Bilevel positive airway pressure (BPAP) was initiated at the beginning of the study and titrated to treat sleep-disordered breathing.  MEDICATIONS Medications self-administered by patient taken the night of the study : N/A  RESPIRATORY PARAMETERS Optimal IPAP Pressure (cm):  AHI at Optimal Pressure (/hr) N/A Optimal EPAP Pressure (cm):    Overall Minimal O2 (%): 74.0 Minimal O2 at Optimal Pressure (%): 74.0 SLEEP ARCHITECTURE Start Time: 9:36:56 PM Stop Time: 4:22:30 AM Total Time (min): 405.6 Total Sleep Time (min): 242 Sleep Latency (min): 57.4 Sleep Efficiency (%): 59.7% REM Latency (min): 86.5 WASO (min): 106.2 Stage N1 (%): 15.7% Stage N2 (%): 65.9% Stage N3 (%): 0.0% Stage R (%): 18.4 Supine (%): 0.00 Arousal Index (/hr): 26.0     CARDIAC DATA The 2 lead EKG demonstrated sinus rhythm. The mean heart rate was 71.0 beats per minute. Other EKG findings include: PVCs. LEG MOVEMENT DATA The total Periodic Limb Movements of Sleep (PLMS) were 0. The PLMS index was 0.0. A PLMS index of <15 is considered normal in  adults.  IMPRESSIONS - An optimal PAP pressure could not be selected for this patient based on the available study data. BiPAP was tried at 12/8 cm - Severe Central Sleep Apnea was noted during this titration (CAI = 47.4/h). - Severe oxygen desaturations were observed during this titration (min O2 = 74.0%). - The patient snored with moderate snoring volume. - 2-lead EKG demonstrated: PVCs - Clinically significant periodic limb movements were not noted during this study. Arousals associated with PLMs were rare.   DIAGNOSIS - Obstructive Sleep Apnea (G47.33) - Central sleep apnea (noted on baseline HST) & treatment emergent centrals were severe on this study - Nocturnal Hypoxemia (G47.36)   RECOMMENDATIONS - Recommend a trial of AutoCPAP 6 - 15 cm H2O. If central apneas persist on download, consider ASV device - Avoid alcohol, sedatives and other CNS depressants that may worsen sleep apnea and disrupt normal sleep architecture. - Sleep hygiene should be reviewed to assess factors that may improve sleep quality. - Weight management and regular exercise should be initiated or continued. - Return to Sleep Center for re-evaluation after 4 weeks of therapy    Kara Mead MD Board Certified in Sleep medicine   07/19/2020

## 2020-07-19 NOTE — Telephone Encounter (Signed)
This study landed in my pile by mistake, seems to be your patient Baseline HST showed predominantly obstructive but few central apneas , AHI 67/hour, centrals 8/hour  Centrals got worse during CPAP titration up to 13 cm, tried BiPAP 12/8 but centrals persisted. May need ASV eventually

## 2020-07-21 ENCOUNTER — Telehealth: Payer: Self-pay | Admitting: *Deleted

## 2020-07-21 NOTE — Telephone Encounter (Signed)
Spoke with pt's wife- he is on Plavix and has documented in his chart that he is difficult airway.   He is having no GI issues, no hx of FHCC. OV made with Janett Billow 08-19-20 at 11:30 am

## 2020-08-09 ENCOUNTER — Telehealth: Payer: Self-pay | Admitting: Pulmonary Disease

## 2020-08-09 DIAGNOSIS — G4733 Obstructive sleep apnea (adult) (pediatric): Secondary | ICD-10-CM

## 2020-08-09 NOTE — Telephone Encounter (Signed)
Yes .  That will be fine by me

## 2020-08-09 NOTE — Telephone Encounter (Signed)
Called and spoke to patient's spouse, Hilda Blades (DPR). Hilda Blades is requesting results from recent sleep study.     AO, please advise. Would you like to proceed with settings that are mentioned by Dr. Elsworth Soho in 07/19/2020 phone note?

## 2020-08-09 NOTE — Telephone Encounter (Signed)
07/19/20 2:40 PM Note This study landed in my pile by mistake, seems to be your patient Baseline HST showed predominantly obstructive but few central apneas , AHI 67/hour, centrals 8/hour  Centrals got worse during CPAP titration up to 13 cm, tried BiPAP 12/8 but centrals persisted. May need ASV eventually     Spoke to patient's spouse, Debra(DPR) and relayed results.  Hilda Blades would like to proceed with bipap.  Dr. Ander Slade, what would you like the pressure support to be?

## 2020-08-10 NOTE — Telephone Encounter (Signed)
Patient needs scheduled for ASV titration  -Will need to be titrated in lab to show that the central apneas did resolve  -Only other option would be BiPAP ST -BiPAP 12/8 with backup rate of 14  -We can send an order to the DME company to see if he can be set up on a BiPAP ST otherwise will need to be retitrated in the lab

## 2020-08-10 NOTE — Telephone Encounter (Signed)
Brandon [name] checking status of BIPAP machine. Brandon [name] phone number is [phone].

## 2020-08-11 NOTE — Telephone Encounter (Signed)
Spoke with pt's wife. She is aware of Dr. Judson Roch response. Order has been placed. Nothing further was needed.

## 2020-08-17 ENCOUNTER — Encounter: Payer: 59 | Admitting: Gastroenterology

## 2020-08-18 ENCOUNTER — Telehealth: Payer: Self-pay | Admitting: Pulmonary Disease

## 2020-08-18 NOTE — Telephone Encounter (Signed)
Duplicate message. 

## 2020-08-19 ENCOUNTER — Encounter: Payer: Self-pay | Admitting: Pulmonary Disease

## 2020-08-19 ENCOUNTER — Telehealth: Payer: Self-pay | Admitting: Gastroenterology

## 2020-08-19 ENCOUNTER — Encounter: Payer: Self-pay | Admitting: Gastroenterology

## 2020-08-19 ENCOUNTER — Ambulatory Visit (INDEPENDENT_AMBULATORY_CARE_PROVIDER_SITE_OTHER): Payer: 59 | Admitting: Gastroenterology

## 2020-08-19 VITALS — BP 130/90 | HR 88 | Ht 69.0 in | Wt 306.0 lb

## 2020-08-19 DIAGNOSIS — T884XXA Failed or difficult intubation, initial encounter: Secondary | ICD-10-CM | POA: Insufficient documentation

## 2020-08-19 DIAGNOSIS — T884XXD Failed or difficult intubation, subsequent encounter: Secondary | ICD-10-CM

## 2020-08-19 DIAGNOSIS — Z7902 Long term (current) use of antithrombotics/antiplatelets: Secondary | ICD-10-CM

## 2020-08-19 DIAGNOSIS — R195 Other fecal abnormalities: Secondary | ICD-10-CM

## 2020-08-19 MED ORDER — SUTAB 1479-225-188 MG PO TABS: 1.0000 | ORAL_TABLET | Freq: Once | ORAL | 0 refills | Status: AC

## 2020-08-19 NOTE — Telephone Encounter (Signed)
Pt's wife called to inform that pt takes generic for Plavix and his PCP Dr. Redmond School manages it.

## 2020-08-19 NOTE — Progress Notes (Signed)
08/19/2020 EDMAN LIPSEY [id number] [birth year]   HISTORY OF PRESENT ILLNESS: This is a 49 year old male who is new to our office.  He has been referred here by his PCP, Dr. Redmond School, in order to discuss colonoscopy for positive Cologuard results.  He had Cologuard performed in August and it was positive.  Has never had a colonoscopy in the past.  He admits to seeing occasional bright red blood with bowel movements.  He says that he moves his bowels regularly.  He has a difficult airway.  He is on Plavix daily for history of coronary artery disease.  He also has some carotid artery disease for which he had some intervention earlier this year, last in April, by Dr. Oneida Alar.   Past Medical History:  Diagnosis Date  . Arteriosclerotic cardiovascular disease (ASCVD) 2006, 2012   2006-acute IMI treated with urgent RCA stent; 2007-Cutting Balloon for in-stent restenosis; 02/2010-presented with ACS and minimal troponin elevation:70% LAD, 80% distal circumflex, 80% proximal ramus branch vessel,in-stent restenosis of 70% in the RCA; BMS for proximal critical RCA stenosis, restenosis Nov 2012  . Carotid artery occlusion   . Heart attack ([place])   . History of noncompliance with medical treatment    Due to financial considerations  . Hyperlipidemia   . Hypertension   . Stroke ([place])   . Tobacco abuse    40 pack years   Past Surgical History:  Procedure Laterality Date  . 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 CORONARY ANGIOPLASTY WITH STENT PLACEMENT    . IR ANGIO EXTERNAL CAROTID SEL EXT CAROTID BILAT MOD SED  12/15/2019  . IR ANGIO VERTEBRAL SEL VERTEBRAL UNI R MOD SED  12/15/2019  . IR ANGIOGRAM EXTREMITY LEFT  12/15/2019  . IR ANGIOGRAM EXTREMITY LEFT  12/15/2019  . IR CT HEAD LTD  12/15/2019  . IR PERCUTANEOUS ART  THROMBECTOMY/INFUSION INTRACRANIAL INC DIAG ANGIO  12/15/2019  . RADIOLOGY WITH ANESTHESIA N/A 12/15/2019   Procedure: IR WITH ANESTHESIA;  Surgeon: Luanne Bras, MD;  Location: [facility];  Service: Radiology;  Laterality: N/A;    reports that he has been smoking cigarettes. He has a 34.00 pack-year smoking history. He has never used smokeless tobacco. He reports that he does not drink alcohol and does not use drugs. family history includes COPD in his maternal grandfather; Cancer in his paternal grandmother; Depression (age of onset: 76) in his mother; Heart disease in his paternal grandmother; Heart disease (age of onset: 35) in his father; Hyperlipidemia in his father; Hypertension in his father. No Known Allergies    Outpatient Encounter Medications as of 08/19/2020  Medication Sig  . acetaminophen-codeine (TYLENOL #3) 300-30 MG tablet Take 1 tablet by mouth every 6 (six) hours as needed.  Marland Kitchen albuterol (VENTOLIN HFA) 108 (90 Base) MCG/ACT inhaler Inhale 2 puffs into the lungs every 6 (six) hours as needed.  Marland Kitchen amoxicillin (AMOXIL) 500 MG capsule Take 1,000 mg by mouth 2 (two) times daily.  Marland Kitchen atorvastatin (LIPITOR) 80 MG tablet Take 1 tablet (80 mg total) by mouth daily.  . clopidogrel (PLAVIX) 75 MG tablet Take 1 tablet (75 mg total) by mouth daily.  . Evolocumab (REPATHA SURECLICK) 812 MG/ML SOAJ Inject 1 Dose into the skin every 14 (fourteen) days.  . fluticasone furoate-vilanterol (BREO ELLIPTA) 100-25 MCG/INH AEPB Inhale 1 puff into the lungs daily.  Marland Kitchen  ezetimibe (ZETIA) 10 MG tablet Take 1 tablet (10 mg total) by mouth daily.   No facility-administered encounter medications on file as of 08/19/2020.     REVIEW OF SYSTEMS  : All other systems reviewed and negative except where noted in the History of Present Illness.   PHYSICAL EXAM: BP 130/90   Pulse 88   Ht 5\' 9"  (1.753 m)   Wt (!) 306 lb (138.8 kg)   SpO2 93%   BMI 45.19 kg/m  General: Well developed white male in no acute  distress Head: Normocephalic and atraumatic Eyes:  Sclerae anicteric, conjunctiva pink. Ears: Normal auditory acuity Lungs: Clear throughout to auscultation; no W/R/R. Heart: Regular rate and rhythm; no M/R/G. Abdomen: Soft, non-distended.  BS present.  Non-tender. Rectal:  Will be done at the time of colonoscopy. Musculoskeletal: Symmetrical with no gross deformities  Skin: No lesions on visible extremities Extremities: No edema  Neurological: Alert oriented x 4, grossly non-focal Psychological:  Alert and cooperative. Normal mood and affect  ASSESSMENT AND PLAN: *Positive cologuard:  Study was 2 months ago.  Never had colonoscopy in the past.  Will schedule with Dr. Bryan Lemma at [facility] due to difficult airway. *Chronic antiplatelet use with Plavix due to history of CAD, etc:  Hold Plavix for 5 days before procedure - will instruct when and how to resume after procedure. Risks and benefits of procedure including bleeding, perforation, infection, missed lesions, medication reactions and possible hospitalization or surgery if complications occur explained. Additional rare but real risk of cardiovascular event such as heart attack or ischemia/infarct of other organs off Plavix explained and need to seek urgent help if this occurs. Will communicate by phone or EMR with patient's prescribing provider to confirm that holding Plavix is reasonable in this case.  *Difficult airway   CC:  Denita Lung, MD

## 2020-08-19 NOTE — Telephone Encounter (Signed)
Spoke with [name] at [facility] who stated they did have the BiPAP titration order and I confirmed it was not a duplicate order. Nothing further needed at this time.

## 2020-08-19 NOTE — Patient Instructions (Signed)
If you are age 51 or older, your body mass index should be between 23-30. Your Body mass index is 45.19 kg/m. If this is out of the aforementioned range listed, please consider follow up with your Primary Care Provider.  If you are age 67 or younger, your body mass index should be between 19-25. Your Body mass index is 45.19 kg/m. If this is out of the aformentioned range listed, please consider follow up with your Primary Care Provider.   You have been scheduled for a colonoscopy. Please follow written instructions given to you at your visit today.  Please pick up your prep supplies at the pharmacy within the next 1-3 days. If you use inhalers (even only as needed), please bring them with you on the day of your procedure.  Due to recent changes in healthcare laws, you may see the results of your imaging and laboratory studies on MyChart before your provider has had a chance to review them.  We understand that in some cases there may be results that are confusing or concerning to you. Not all laboratory results come back in the same time frame and the provider may be waiting for multiple results in order to interpret others.  Please give [place] 48 hours in order for your provider to thoroughly review all the results before contacting the office for clarification of your results.

## 2020-08-23 ENCOUNTER — Telehealth: Payer: Self-pay | Admitting: *Deleted

## 2020-08-23 NOTE — Telephone Encounter (Signed)
Clearance request sent to Dr. Redmond School.

## 2020-08-23 NOTE — Telephone Encounter (Signed)
° °  Brandon [name] [birth year] [id number]  Dear Dr. Redmond School:  We have scheduled the above named patient for a(n) colonoscopy procedure. Our records show that (s)he is on anticoagulation therapy.  Please advise as to whether the patient may come off their therapy of Plavix 5 days prior to their procedure which is scheduled for Tuesday 09/21/20.  Please route your response to Caryl Asp, [place] or fax response to [phone].  Sincerely,   Caryl Asp, [facility]

## 2020-08-23 NOTE — Telephone Encounter (Signed)
I am okay with that.

## 2020-08-24 ENCOUNTER — Encounter: Payer: Self-pay | Admitting: Internal Medicine

## 2020-08-24 ENCOUNTER — Telehealth (INDEPENDENT_AMBULATORY_CARE_PROVIDER_SITE_OTHER): Payer: 59 | Admitting: Internal Medicine

## 2020-08-24 VITALS — BP 130/90 | Wt 306.0 lb

## 2020-08-24 DIAGNOSIS — I2510 Atherosclerotic heart disease of native coronary artery without angina pectoris: Secondary | ICD-10-CM

## 2020-08-24 DIAGNOSIS — I779 Disorder of arteries and arterioles, unspecified: Secondary | ICD-10-CM

## 2020-08-24 DIAGNOSIS — E785 Hyperlipidemia, unspecified: Secondary | ICD-10-CM

## 2020-08-24 DIAGNOSIS — I10 Essential (primary) hypertension: Secondary | ICD-10-CM | POA: Diagnosis not present

## 2020-08-24 LAB — LIPID PANEL
Chol/HDL Ratio: 3.5 ratio (ref 0.0–5.0)
Cholesterol, Total: 126 mg/dL (ref 100–199)
HDL: 36 mg/dL — ABNORMAL LOW (ref >39)
LDL Chol Calc (NIH): 59 mg/dL (ref 0–99)
Triglycerides: 183 mg/dL — ABNORMAL HIGH (ref 0–149)
VLDL Cholesterol Cal: 31 mg/dL (ref 5–40)

## 2020-08-24 LAB — HEPATIC FUNCTION PANEL
ALT: 35 IU/L (ref 0–44)
AST: 32 IU/L (ref 0–40)
Albumin: 4.1 g/dL (ref 3.8–4.9)
Alkaline Phosphatase: 104 IU/L (ref 44–121)
Bilirubin Total: 0.4 mg/dL (ref 0.0–1.2)
Bilirubin, Direct: 0.12 mg/dL (ref 0.00–0.40)
Total Protein: 6.8 g/dL (ref 6.0–8.5)

## 2020-08-24 NOTE — Progress Notes (Signed)
Agree with the assessment and plan as outlined by Jessica Zehr, PA-C. ? ?Vallarie Fei, DO, FACG ? ?

## 2020-08-24 NOTE — Patient Instructions (Signed)
Medication Instructions:  Your physician recommends that you continue on your current medications as directed. Please refer to the Current Medication list given to you today.  *If you need a refill on your cardiac medications before your next appointment, please call your pharmacy*   Lab Work: FASTING lipid panel in 1 year (before your next visit with Dr. Debara Pickett)  If you have labs (blood work) drawn today and your tests are completely normal, you will receive your results only by: Marland Kitchen MyChart Message (if you have MyChart) OR . A paper copy in the mail If you have any lab test that is abnormal or we need to change your treatment, we will call you to review the results.   Testing/Procedures: NONE   Follow-Up: At [facility], you and your health needs are our priority.  As part of our continuing mission to provide you with exceptional heart care, we have created designated Provider Care Teams.  These Care Teams include your primary Cardiologist (physician) and Advanced Practice Providers (APPs -  Physician Assistants and Nurse Practitioners) who all work together to provide you with the care you need, when you need it.  We recommend signing up for the patient portal called "MyChart".  Sign up information is provided on this After Visit Summary.  MyChart is used to connect with patients for Virtual Visits (Telemedicine).  Patients are able to view lab/test results, encounter notes, upcoming appointments, etc.  Non-urgent messages can be sent to your provider as well.   To learn more about what you can do with MyChart, go to [email or web address].    Your next appointment:   12 month(s) - lipid clinic  The format for your next appointment:   In Person or Virtual  Provider:   Dr. Lyman Bishop

## 2020-08-24 NOTE — Progress Notes (Signed)
Virtual Visit via Telephone Note   This visit type was conducted due to national recommendations for restrictions regarding the COVID-19 Pandemic (e.g. social distancing) in an effort to limit this patient's exposure and mitigate transmission in our community.  Due to his co-morbid illnesses, this patient is at least at moderate risk for complications without adequate follow up.  This format is felt to be most appropriate for this patient at this time.  The patient did not have access to video technology/had technical difficulties with video requiring transitioning to audio format only (telephone).  All issues noted in this document were discussed and addressed.  No physical exam could be performed with this format.  Please refer to the patient's chart for his  consent to telehealth for [facility].   Evaluation Performed: Lipid clinic evaluation  Date:  08/24/2020   ID:  Brandon [name], DOB [birth year], MRN [id number]  Patient Location:  [address] [place] [address]  Provider location:   [address], [place] [place], [place] [address]  PCP:  Denita Lung, MD  Cardiologist:  [name], MD Electrophysiologist:  None   Chief Complaint:  Manage dyslipidemia  History of Present Illness:    Brandon [name] is a 49 y.o. male who presents via audio/video conferencing for a telehealth visit today.  [name] is a pleasant 49 year old male who unfortunately had an inferior MI in 2006 at the age of 55.  He had a right coronary artery stent and then developed some in-stent restenosis.  He also had an acute coronary syndrome in 2011 with significant progression of coronary disease but no significant obstruction.  More recently, just a few weeks ago he had a TIA event.  He was seen by Dr. Gwenlyn Found just prior to that and recommended to have more aggressive lipid management.  There is a family history of early onset heart disease in his father who also had an MI in his 43s  suggestive of familial hyperlipidemia or perhaps elevated LP(a) as a cause of his symptoms.  This has not been assessed.  Most recently his lipid profile showed total cholesterol 155, triglycerides 152, HDL 32 and LDL 93.  His target LDL is less than 70.  We discussed various options for him however I think the best cardiovascular risk reduction given his recent TIA and recurrent coronary disease and aggressive symptoms would be a PCSK9 inhibitor.  03/31/2020  [name] was seen today for virtual follow-up.  This was via a telephone visit.  Reports feeling fairly well.  He had repeat lipids on 03/25/2020 which demonstrated reduction total cholesterol down to 155, triglycerides 135, HDL 38 and LDL of 93 (previously had been as high as 194).  This is due to the addition of ezetimibe.  He is currently also on high potency statin.  His target LDL is less than 70.  08/24/2020  [name] is seen today for follow-up.  He is doing well on combination of PCSK9 inhibitor and ezetimibe.  His lipids have improved significantly.  His total cholesterol 8 months ago was 215, now down to 126.  Triglycerides were up slightly at 183.  HDL was 36.  His LDLs come down from 146-59 on therapy.  He seems to be tolerating this without any side effects.  He is also on the ezetimibe.  Better as well.  His AST and ALT were 44/50 approximately 6 months ago and have improved to 32/35 yesterday.  He reports that he is having  an upcoming colonoscopy because of an abnormal Cologuard test.  He also has been having issues with daytime somnolence and was diagnosed with sleep apnea with an AHI over 60.  Hopefully he will get on therapy for that.  The patient does not have symptoms concerning for COVID-19 infection (fever, chills, cough, or new SHORTNESS OF BREATH).    Prior CV studies:   The following studies were reviewed today:  Chart reviewed  PMHx:  Past Medical History:  Diagnosis Date  . Arteriosclerotic cardiovascular  disease (ASCVD) 2006, 2012   2006-acute IMI treated with urgent RCA stent; 2007-[place] for in-stent restenosis; 02/2010-presented with ACS and minimal troponin elevation:70% LAD, 80% distal circumflex, 80% proximal ramus branch vessel,in-stent restenosis of 70% in the RCA; BMS for proximal critical RCA stenosis, restenosis Nov 2012  . Carotid artery occlusion   . Heart attack ([place])   . History of noncompliance with medical treatment    Due to financial considerations  . Hyperlipidemia   . Hypertension   . Stroke ([place])   . Tobacco abuse    40 pack years    Past Surgical History:  Procedure Laterality Date  . 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   Procedure: BYPASS GRAFT CAROTID-SUBCLAVIAN USING HEMASHIELD GOLD GRAFT;  Surgeon: Elam Dutch, MD;  Location: [facility];  Service: Vascular;  Laterality: Left;  . CORONARY ANGIOPLASTY WITH STENT PLACEMENT    . IR ANGIO EXTERNAL CAROTID SEL EXT CAROTID BILAT MOD SED  12/15/2019  . IR ANGIO VERTEBRAL SEL VERTEBRAL UNI R MOD SED  12/15/2019  . IR ANGIOGRAM EXTREMITY LEFT  12/15/2019  . IR ANGIOGRAM EXTREMITY LEFT  12/15/2019  . IR CT HEAD LTD  12/15/2019  . IR PERCUTANEOUS ART THROMBECTOMY/INFUSION INTRACRANIAL INC DIAG ANGIO  12/15/2019  . RADIOLOGY WITH ANESTHESIA N/A 12/15/2019   Procedure: IR WITH ANESTHESIA;  Surgeon: Luanne Bras, MD;  Location: [facility];  Service: Radiology;  Laterality: N/A;    FAMHx:  Family History  Problem Relation Age of Onset  . Depression Mother 43       Suicide  . Heart disease Father 66       Deceased from massive heart-attack  . Hyperlipidemia Father   . Hypertension Father   . COPD Maternal Grandfather   . Cancer Paternal Grandmother        Male Cancer  . Heart disease Paternal Grandmother   . Colon cancer Neg Hx   . Pancreatic cancer Neg Hx   . Esophageal cancer Neg Hx     SOCHx:   reports that he has been smoking cigarettes. He has a 34.00 pack-year smoking history. He has  never used smokeless tobacco. He reports that he does not drink alcohol and does not use drugs.  ALLERGIES:  No Known Allergies  MEDS:  Current Meds  Medication Sig  . albuterol (VENTOLIN HFA) 108 (90 Base) MCG/ACT inhaler Inhale 2 puffs into the lungs every 6 (six) hours as needed.  Marland Kitchen amoxicillin (AMOXIL) 500 MG capsule Take 500 mg by mouth in the morning, at noon, in the evening, and at bedtime.   Marland Kitchen aspirin 325 MG EC tablet Take 325 mg by mouth daily.  Marland Kitchen atorvastatin (LIPITOR) 80 MG tablet Take 1 tablet (80 mg total) by mouth daily.  . clopidogrel (PLAVIX) 75 MG tablet Take 1 tablet (75 mg total) by mouth daily.  . Evolocumab (REPATHA SURECLICK) 563 MG/ML SOAJ Inject 1 Dose into the skin every 14 (fourteen) days.  . fluticasone furoate-vilanterol (BREO ELLIPTA) 100-25  MCG/INH AEPB Inhale 1 puff into the lungs daily.  . [DISCONTINUED] acetaminophen-codeine (TYLENOL #3) 300-30 MG tablet Take 1 tablet by mouth every 6 (six) hours as needed.     ROS: Pertinent items noted in HPI and remainder of comprehensive ROS otherwise negative.  Labs/Other Tests and Data Reviewed:    Recent Labs: 02/19/2020: B Natriuretic Peptide 68.5; BUN 9; Creatinine, Ser 0.89; Hemoglobin 13.9; Platelets 252; Potassium 3.6; Sodium 140 08/23/2020: ALT 35   Recent Lipid Panel Lab Results  Component Value Date/Time   CHOL 126 08/23/2020 09:33 AM   TRIG 183 (H) 08/23/2020 09:33 AM   HDL 36 (L) 08/23/2020 09:33 AM   CHOLHDL 3.5 08/23/2020 09:33 AM   CHOLHDL 4.8 12/15/2019 03:44 AM   LDLCALC 59 08/23/2020 09:33 AM    Wt Readings from Last 3 Encounters:  08/24/20 (!) 306 lb (138.8 kg)  08/19/20 (!) 306 lb (138.8 kg)  06/01/20 (!) 306 lb (138.8 kg)     Exam:    Vital Signs:  BP 130/90   Wt (!) 306 lb (138.8 kg)   BMI 45.19 kg/m    Exam not performed due to telephone visit  ASSESSMENT & PLAN:    1. 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 Strong family history  of premature coronary disease 3. Recent TIA 4. History of inferior MI with residual coronary disease and prior right coronary artery stent  Mr. Deshmukh is doing very well on combination PCSK9 inhibitor and ezetimibe.  He is now at target LDL much less than 70.  His liver enzymes have normalized.  He is undergoing fitment for CPAP based on an abnormal sleep study and has an upcoming colonoscopy to evaluate an abnormal Cologuard test.  Plan follow-up with me regarding his dyslipidemia in 1 year.  COVID-19 Education: The signs and symptoms of COVID-19 were discussed with the patient and how to seek care for testing (follow up with PCP or arrange E-visit).  The importance of social distancing was discussed today.  Patient Risk:   After full review of this patients clinical status, I feel that they are at least moderate risk at this time.  Time:   Today, I have spent 25 minutes with the patient with telehealth technology discussing dyslipidemia, TIA, coronary artery disease, family history.     Medication Adjustments/Labs and Tests Ordered: Current medicines are reviewed at length with the patient today.  Concerns regarding medicines are outlined above.   Tests Ordered: No orders of the defined types were placed in this encounter.   Medication Changes: No orders of the defined types were placed in this encounter.   Disposition:  in 1 year(s)  Pixie Casino, MD, [facility], [place] Director of the Advanced Lipid Disorders &  Cardiovascular Risk Reduction Clinic Diplomate of the American Board of Clinical Lipidology Attending Cardiologist  Direct Dial: [phone]  Fax: [phone]  Website:  [email or web address]  Pixie Casino, MD  08/24/2020 9:28 AM

## 2020-08-25 NOTE — Telephone Encounter (Signed)
Informed patient to hold Plavix for 5 days. Patient voiced understanding.

## 2020-08-27 NOTE — Telephone Encounter (Signed)
Encounter created in error. Will sign off.  

## 2020-08-30 ENCOUNTER — Other Ambulatory Visit: Payer: Self-pay

## 2020-08-30 ENCOUNTER — Ambulatory Visit: Payer: 59 | Attending: Pulmonary Disease | Admitting: Pulmonary Disease

## 2020-08-30 DIAGNOSIS — I493 Ventricular premature depolarization: Secondary | ICD-10-CM | POA: Insufficient documentation

## 2020-08-30 DIAGNOSIS — G4733 Obstructive sleep apnea (adult) (pediatric): Secondary | ICD-10-CM | POA: Insufficient documentation

## 2020-09-01 ENCOUNTER — Telehealth: Payer: Self-pay | Admitting: Pulmonary Disease

## 2020-09-01 DIAGNOSIS — G4733 Obstructive sleep apnea (adult) (pediatric): Secondary | ICD-10-CM

## 2020-09-01 NOTE — Telephone Encounter (Signed)
Spoke with the pt and notified of results/recs per Dr Ander Slade  He verbalized understanding and I have sent order to [facility] Recall placed for f/u

## 2020-09-01 NOTE — Telephone Encounter (Signed)
Call patient  Sleep study result  Date of study: 08/30/2020  Impression: Severe obstructive sleep apnea  Recommendation:  DME referral  - Recommend Auto-BiPAP 6 - 20 cm H2O. Max IPAP 20, Min EPAP 6, Pressure support 4. with heated humidification - Mask-ResMed Airfit F20, FFM mdium size  Encourage weight loss measures  Follow-up in the office 4 to 6 weeks following initiation of treatment

## 2020-09-01 NOTE — Procedures (Signed)
POLYSOMNOGRAPHY  Last, First: Byran, Bilotti MRN: [id number] Gender: Male Age (years): 49 Weight (lbs): 306 DOB: [birth year] BMI: 45 Primary Care: No PCP Epworth Score: 8 Referring: Laurin Coder MD Technician: Rosebud Poles Interpreting: Laurin Coder MD Study Type: BiPAP Ordered Study Type: BiPAP Study date: 08/30/2020 Location: [facility] CLINICAL INFORMATION Brandon [name] is a 49 year old Male and was referred to the sleep center for evaluation of N/A. Indications include OSA.   Most recent titration study dated 07/15/2020 revealed an AHI of 92.7/h. MEDICATIONS Patient self administered medications include: N/A. Medications administered during study include No sleep medicine administered.  SLEEP STUDY TECHNIQUE The patient underwent an attended overnight polysomnography titration to assess the effects of BIPAP therapy. The following variables were monitored: EEG(C4-A1, C3-A2, O1-A2, O2-A1), EOG, submental and leg EMG, ECG, oxyhemoglobin saturation by pulse oximetry, thoracic and abdominal respiratory effort belts, nasal/oral airflow by pressure sensor, body position sensor and snoring sensor. BIPAP pressure was titrated to eliminate apneas, hypopneas and oxygen desaturation. Hypopneas were scored per AASM definition IB (4% desaturation)  TECHNICIAN COMMENTS Comments added by Technician: BiPAP therapy started at 6/3 cm of H2O and increased to 20/16 cm of H2O. Patient tolerated BILEVEL very well, although when patient was in supine positon, BILEVEL pressure became difficult for patient to tolerate. Central events seem to be eradicated in lateral position, although hyponeic events were still present. Suboptimal pressure obtained due to central events increased in supine position during therapy treatment. Patient stated that he felt rested upon awakening. Please not EKG patterns at times. Example; pages 261, 271 Comments added by Scorer: N/A SLEEP ARCHITECTURE The study was  initiated at 10:28:04 PM and terminated at 5:01:11 AM. Total recorded time was 393.1 minutes. EEG confirmed total sleep time was 238 minutes yielding a sleep efficiency of 60.5%%. Sleep onset after lights out was 80.3 minutes with a REM latency of 49.0 minutes. The patient spent 9.7%% of the night in stage N1 sleep, 39.5%% in stage N2 sleep, 16.0%% in stage N3 and 34.9% in REM. The Arousal Index was 18.7/hour. RESPIRATORY PARAMETERS There were a total of 273 respiratory disturbances out of which 149 were apneas ( 27 obstructive, 0 mixed, 122 central) and 124 hypopneas. The apnea/hypopnea index (AHI) was 68.8 events/hour. The central sleep apnea index was 30.8 events/hour. The REM AHI was 15.9 events/hour and NREM AHI was 97.2 events/hour. The supine AHI was 107.7 events/hour and the non supine AHI was 65.4 supine during 8.19% of sleep. Respiratory disturbances were associated with oxygen desaturation down to a nadir of 77.0% during sleep. The mean oxygen saturation during the study was 89.9%. The cumulative time under 88% oxygen saturation was 5.5 minutes. Optimal pressure not acheived.  LEG MOVEMENT DATA The total leg movements were 0 with a resulting leg movement index of 0.0. Associated arousal with leg movement index was 0.0. CARDIAC DATA The underlying cardiac rhythm was most consistent with sinus rhythm. Mean heart rate during sleep was 68.0 bpm. Additional rhythm abnormalities include PVCs.   IMPRESSIONS - Severe Obstructive Sleep apnea(OSA) Sub-Optimal pressure attained with a lot of variability with sleep positions - Electrocardiographic data showed presence of PVCs. - Severe Oxygen Desaturation - The patient snored with moderate snoring volume. - No significant periodic leg movements(PLMs) during sleep. However, no significant associated arousals.   DIAGNOSIS - Obstructive Sleep Apnea (G47.33)   RECOMMENDATIONS - Recommend a trial of Auto-BiPAP 6 - 20 cm H2O. Max IPAP 20, Min EPAP6,  Pressure support 4. with heated humidification -  Mask-ResMed Airfit F20, FFM mdium size - Avoid alcohol, sedatives and other CNS depressants that may worsen sleep apnea and disrupt normal sleep architecture. - Sleep hygiene should be reviewed to assess factors that may improve sleep quality. - Weight management and regular exercise should be initiated or continued. - Return to Sleep Center for re-evaluation after 4 weeks of therapy  [Electronically signed] 09/01/2020 05:43 AM  Sherrilyn Rist MD NPI: [id number]

## 2020-09-17 ENCOUNTER — Other Ambulatory Visit (HOSPITAL_COMMUNITY)
Admission: RE | Admit: 2020-09-17 | Discharge: 2020-09-17 | Disposition: A | Discharge status: Home / Self Care | Payer: 59 | Source: Ambulatory Visit | Attending: Gastroenterology | Admitting: Gastroenterology

## 2020-09-17 DIAGNOSIS — Z01812 Encounter for preprocedural laboratory examination: Secondary | ICD-10-CM | POA: Insufficient documentation

## 2020-09-17 DIAGNOSIS — Z20822 Contact with and (suspected) exposure to covid-19: Secondary | ICD-10-CM | POA: Insufficient documentation

## 2020-09-17 LAB — SARS CORONAVIRUS 2 (TAT 6-24 HRS): SARS Coronavirus 2: NEGATIVE

## 2020-09-21 ENCOUNTER — Encounter (HOSPITAL_COMMUNITY): Payer: Self-pay | Admitting: Gastroenterology

## 2020-09-21 ENCOUNTER — Ambulatory Visit (HOSPITAL_COMMUNITY): Payer: 59 | Admitting: Anesthesiology

## 2020-09-21 ENCOUNTER — Encounter (HOSPITAL_COMMUNITY)
Admission: RE | Disposition: A | Discharge status: Home / Self Care | Payer: Self-pay | Source: Home / Self Care | Attending: Gastroenterology

## 2020-09-21 ENCOUNTER — Ambulatory Visit (HOSPITAL_COMMUNITY)
Admission: RE | Admit: 2020-09-21 | Discharge: 2020-09-21 | Disposition: A | Discharge status: Home / Self Care | Payer: 59 | Attending: Gastroenterology | Admitting: Gastroenterology

## 2020-09-21 ENCOUNTER — Other Ambulatory Visit: Payer: Self-pay

## 2020-09-21 DIAGNOSIS — Z7902 Long term (current) use of antithrombotics/antiplatelets: Secondary | ICD-10-CM | POA: Insufficient documentation

## 2020-09-21 DIAGNOSIS — K641 Second degree hemorrhoids: Secondary | ICD-10-CM | POA: Diagnosis not present

## 2020-09-21 DIAGNOSIS — K621 Rectal polyp: Secondary | ICD-10-CM | POA: Diagnosis not present

## 2020-09-21 DIAGNOSIS — K644 Residual hemorrhoidal skin tags: Secondary | ICD-10-CM | POA: Diagnosis not present

## 2020-09-21 DIAGNOSIS — K635 Polyp of colon: Secondary | ICD-10-CM

## 2020-09-21 DIAGNOSIS — D127 Benign neoplasm of rectosigmoid junction: Secondary | ICD-10-CM | POA: Diagnosis not present

## 2020-09-21 DIAGNOSIS — R195 Other fecal abnormalities: Secondary | ICD-10-CM | POA: Diagnosis not present

## 2020-09-21 DIAGNOSIS — F17200 Nicotine dependence, unspecified, uncomplicated: Secondary | ICD-10-CM | POA: Diagnosis not present

## 2020-09-21 DIAGNOSIS — D125 Benign neoplasm of sigmoid colon: Secondary | ICD-10-CM

## 2020-09-21 DIAGNOSIS — D124 Benign neoplasm of descending colon: Secondary | ICD-10-CM | POA: Diagnosis not present

## 2020-09-21 HISTORY — PX: POLYPECTOMY: SHX5525

## 2020-09-21 HISTORY — PX: HEMOSTASIS CLIP PLACEMENT: SHX6857

## 2020-09-21 HISTORY — PX: COLONOSCOPY WITH PROPOFOL: SHX5780

## 2020-09-21 SURGERY — COLONOSCOPY WITH PROPOFOL
Anesthesia: General

## 2020-09-21 MED ORDER — LACTATED RINGERS IV SOLN: Freq: Once | INTRAVENOUS | Status: AC

## 2020-09-21 MED ORDER — SODIUM CHLORIDE 0.9 % IV SOLN: INTRAVENOUS | Status: DC

## 2020-09-21 MED ORDER — PROPOFOL 500 MG/50ML IV EMUL
INTRAVENOUS | Status: DC | PRN
  Administered 2020-09-21: 150 ug/kg/min via INTRAVENOUS

## 2020-09-21 MED ORDER — LACTATED RINGERS IV SOLN: INTRAVENOUS | Status: DC | PRN

## 2020-09-21 MED ORDER — PROPOFOL 10 MG/ML IV BOLUS
INTRAVENOUS | Status: DC | PRN
  Administered 2020-09-21: 20 mg via INTRAVENOUS
  Administered 2020-09-21: 30 mg via INTRAVENOUS

## 2020-09-21 SURGICAL SUPPLY — 22 items

## 2020-09-21 NOTE — Op Note (Signed)
[facility] Patient Name: Brandon [name] Procedure Date: 09/21/2020 MRN: [id number] Attending MD: Gerrit Heck , MD Date of Birth: 04/04/1971 CSN: [id number] Age: 49 Admit Type: Outpatient Procedure:                Colonoscopy Indications:              Positive Cologuard test Providers:                Gerrit Heck, MD, Erenest Rasher, RN, Benetta Spar, Technician, Ladona Ridgel, Technician Referring MD:              Medicines:                Monitored Anesthesia Care Complications:            No immediate complications. Estimated Blood Loss:     Estimated blood loss was minimal. Procedure:                Pre-Anesthesia Assessment:                           - Prior to the procedure, a History and Physical                            was performed, and patient medications and                            allergies were reviewed. The patient's tolerance of                            previous anesthesia was also reviewed. The risks                            and benefits of the procedure and the sedation                            options and risks were discussed with the patient.                            All questions were answered, and informed consent                            was obtained. Prior Anticoagulants: The patient has                            taken Plavix (clopidogrel), last dose was 5 days                            prior to procedure. ASA 325 mg taken yesterday. ASA                            Grade Assessment: III - A patient with severe  systemic disease. After reviewing the risks and                            benefits, the patient was deemed in satisfactory                            condition to undergo the procedure.                           After obtaining informed consent, the colonoscope                            was passed under direct vision. Throughout the                             procedure, the patient's blood pressure, pulse, and                            oxygen saturations were monitored continuously. The                            CF-HQ190L ([id number] Olympus colonoscope was                            introduced through the anus and advanced to the the                            terminal ileum. The colonoscopy was performed                            without difficulty. The patient tolerated the                            procedure well. The quality of the bowel                            preparation was good. The terminal ileum, ileocecal                            valve, appendiceal orifice, and rectum were                            photographed. Scope In: 11:45:57 AM Scope Out: 12:29:30 PM Scope Withdrawal Time: 0 hours 41 minutes 28 seconds  Total Procedure Duration: 0 hours 43 minutes 33 seconds  Findings:      Hemorrhoids were found on perianal exam.      11 sessile polyps were found in the sigmoid colon (8) and descending       colon (3). The polyps were 3 to 8 mm in size. These polyps were removed       with a cold snare. Resection and retrieval were complete. Estimated       blood loss was minimal.      A 15 mm polyp was found in the sigmoid colon. The polyp was       pedunculated. The  polyp was removed with a hot snare. Resection and       retrieval were complete. Estimated blood loss: none.      A 20 mm polyp was found in the rectum. The polyp was pedunculated. The       polyp was removed with a hot snare. Resection and retrieval were       complete. Given size of polyp resected and ongoing ASA and plan to       restart Plavix, one hemostatic clip was successfully placed (MR       conditional) for prophylaxis. There was no bleeding at the end of the       procedure.      Three sessile polyps were found in the rectum. The polyps were 3 to 4 mm       in size. These polyps were removed with a cold snare. Resection and       retrieval were complete.  Estimated blood loss was minimal.      Retroflexion in the rectum was not performed due to anatomy (narrowed       rectal vault)      Non-bleeding internal hemorrhoids were found. The hemorrhoids were small       and Grade II (internal hemorrhoids that prolapse but reduce       spontaneously).      The terminal ileum appeared normal. Impression:               - Hemorrhoids found on perianal exam.                           - 11 3 to 8 mm polyps in the sigmoid colon and in                            the descending colon, removed with a cold snare.                            Resected and retrieved.                           - One 15 mm polyp in the sigmoid colon, removed                            with a hot snare. Resected and retrieved.                           - One 20 mm polyp in the rectum, removed with a hot                            snare. Resected and retrieved. Clip (MR                            conditional) was placed.                           - Three 3 to 4 mm polyps in the rectum, removed                            with  a cold snare. Resected and retrieved.                           - Non-bleeding internal hemorrhoids.                           - The examined portion of the ileum was normal. Moderate Sedation:      Not Applicable - Patient had care per Anesthesia. Recommendation:           - Patient has a contact number available for                            emergencies. The signs and symptoms of potential                            delayed complications were discussed with the                            patient. Return to normal activities tomorrow.                            Written discharge instructions were provided to the                            patient.                           - Resume previous diet.                           - Continue present medications.                           - Resume Plavix (clopidogrel) at prior dose in 2                             days.                           - Await pathology results.                           - Repeat colonoscopy in 1 year for surveillance if                            pathology consistent with 10+ adenomas.                           - Return to GI clinic PRN.                           - Internal hemorrhoids were noted on this study and                            may be amenable to hemorrhoid band ligation. If you  are interested in further treatment of these                            hemorrhoids with band ligation, please contact my                            clinic to set up an appointment for evaluation and                            treatment. Procedure Code(s):        --- Professional ---                           [phone], Colonoscopy, flexible; with removal of                            tumor(s), polyp(s), or other lesion(s) by snare                            technique Diagnosis Code(s):        --- Professional ---                           K64.1, Second degree hemorrhoids                           K63.5, Polyp of colon                           K62.1, Rectal polyp                           R19.5, Other fecal abnormalities CPT copyright 2019 American Medical Association. All rights reserved. The codes documented in this report are preliminary and upon coder review may  be revised to meet current compliance requirements. Gerrit Heck, MD 09/21/2020 12:38:19 PM Number of Addenda: 0

## 2020-09-21 NOTE — Transfer of Care (Signed)
Immediate Anesthesia Transfer of Care Note  Patient: Brandon [name]  Procedure(s) Performed: COLONOSCOPY WITH PROPOFOL (N/A ) POLYPECTOMY  Patient Location: PACU  Anesthesia Type:MAC  Level of Consciousness: drowsy  Airway & Oxygen Therapy: Patient Spontanous Breathing and Patient connected to face mask oxygen  Post-op Assessment: Report given to RN and Post -op Vital signs reviewed and stable  Post vital signs: Reviewed and stable  Last Vitals:  Vitals Value Taken Time  BP 91/61 09/21/20 1240  Temp    Pulse 84 09/21/20 1240  Resp 14 09/21/20 1240  SpO2 94 % 09/21/20 1240  Vitals shown include unvalidated device data.  Last Pain:  Vitals:   09/21/20 1238  TempSrc:   PainSc: Asleep         Complications: No complications documented.

## 2020-09-21 NOTE — Anesthesia Procedure Notes (Signed)
Procedure Name: MAC Date/Time: 09/21/2020 11:36 AM Performed by: Maxwell Caul, CRNA Pre-anesthesia Checklist: Patient identified, Emergency Drugs available, Suction available and Patient being monitored Oxygen Delivery Method: Simple face mask

## 2020-09-21 NOTE — Anesthesia Preprocedure Evaluation (Addendum)
Anesthesia Evaluation  Patient identified by MRN, date of birth, ID band Patient awake    Reviewed: Allergy & Precautions, NPO status , Patient's Chart, lab work & pertinent test results  History of Anesthesia Complications Negative for: history of anesthetic complications  Airway Mallampati: III  TM Distance: >3 FB Neck ROM: Full    Dental  (+) Poor Dentition, Loose, Missing, Dental Advisory Given Large gap between front two teeth:   Pulmonary sleep apnea , Current Smoker and Patient abstained from smoking.,    Pulmonary exam normal        Cardiovascular hypertension, + Past MI, + Cardiac Stents and + Peripheral Vascular Disease (on ASA/Plavix)  Normal cardiovascular exam  Sublcavian steal syndrome  '21 TTE - EF 55 to 60%. LV was mildly dilated. Elevated  left ventricular end-diastolic pressure. Trivial AI.  '21 Carotid US - Right Carotid: 1-39% stenosis.  Left Carotid: The internal carotid artery appears to be occluded.  Vertebrals: Right vertebral artery demonstrates antegrade flow. Left vertebral artery demonstrates retrograde flow.  Subclavians: Normal flow hemodynamics were seen in the right subclavian artery. Monophasic flow in the left subclavian artery.       Neuro/Psych PSYCHIATRIC DISORDERS Depression CVA, No Residual Symptoms    GI/Hepatic Neg liver ROS, GERD  Controlled,  Endo/Other  Morbid obesity  Renal/GU negative Renal ROS     Musculoskeletal negative musculoskeletal ROS (+)   Abdominal   Peds negative pediatric ROS (+)  Hematology  Last dose of plavix 1 week ago    Anesthesia Other Findings    Reproductive/Obstetrics                           Anesthesia Physical  Anesthesia Plan  ASA: III  Anesthesia Plan: General   Post-op Pain Management:    Induction: Intravenous  PONV Risk Score and Plan: 2 and Treatment may vary due to age or medical condition,  Ondansetron and Propofol infusion  Airway Management Planned: Natural Airway and Simple Face Mask  Additional Equipment:   Intra-op Plan:   Post-operative Plan:   Informed Consent: I have reviewed the patients History and Physical, chart, labs and discussed the procedure including the risks, benefits and alternatives for the proposed anesthesia with the patient or authorized representative who has indicated his/her understanding and acceptance.     Dental advisory given  Plan Discussed with: CRNA and Anesthesiologist  Anesthesia Plan Comments:        Anesthesia Quick Evaluation

## 2020-09-21 NOTE — H&P (Signed)
    P  Chief Complaint:    Positive Cologuard  HPI:     Patient is a 49 y.o. male with a history of CAD (on Plavix), HTN, HLD, prior stroke, tobacco use disorder, history of difficult airway, presenting to [facility] for colonoscopy.  Was seen in the GI clinic by Alonza Bogus on 08/11/2020 with history as below:  This is a 49 year old male who is new to our office.  He has been referred here by his PCP, Dr. Redmond School, in order to discuss colonoscopy for positive Cologuard results.  He had Cologuard performed in August and it was positive.  Has never had a colonoscopy in the past.  He admits to seeing occasional bright red blood with bowel movements.  He says that he moves his bowels regularly.  He has a difficult airway.  He is on Plavix daily for history of coronary artery disease.  He also has some carotid artery disease for which he had some intervention earlier this year, last in April, by Dr. Oneida Alar.   Review of systems:     No chest pain, no SOB, no fevers, no urinary sx   Past Medical History:  Diagnosis Date  . Arteriosclerotic cardiovascular disease (ASCVD) 2006, 2012   2006-acute IMI treated with urgent RCA stent; 2007-[place] for in-stent restenosis; 02/2010-presented with ACS and minimal troponin elevation:70% LAD, 80% distal circumflex, 80% proximal ramus branch vessel,in-stent restenosis of 70% in the RCA; BMS for proximal critical RCA stenosis, restenosis Nov 2012  . Carotid artery occlusion   . Heart attack ([place])   . History of noncompliance with medical treatment    Due to financial considerations  . Hyperlipidemia   . Hypertension   . Stroke ([place])   . Tobacco abuse    40 pack years    Patient's surgical history, family medical history, social history, medications and allergies were all reviewed in Epic    No current facility-administered medications for this encounter.    Physical Exam:     There were no vitals taken for this  visit.  GENERAL:  Pleasant male in NAD PSYCH: : Cooperative, normal affect EENT:  conjunctiva pink, mucous membranes moist, neck supple without masses CARDIAC:  RRR, no murmur heard, no peripheral edema PULM: Normal respiratory effort, lungs CTA bilaterally, no wheezing ABDOMEN:  Nondistended, soft, nontender. No obvious masses, no hepatomegaly,  normal bowel sounds SKIN:  turgor, no lesions seen Musculoskeletal:  Normal muscle tone, normal strength NEURO: Alert and oriented x 3, no focal neurologic deficits   IMPRESSION and PLAN:    1) Positive Cologuard 2) Hematochezia -Colonoscopy today  3) History of CAD 4) Chronic antiplatelet therapy -Has been holding Plavix for 5+ days  5) History of difficult airway -Procedures scheduled at [facility]          Lavena Bullion ,DO, [facility] 09/21/2020, 10:29 AM

## 2020-09-21 NOTE — Discharge Instructions (Signed)
YOU MAY RESUME YOUR ASPIRIN TODAY  PLEASE RESUME PLAVIX IN 2 DAYS (09/23/20)  YOU HAD AN ENDOSCOPIC PROCEDURE TODAY: Refer to the procedure report and other information in the discharge instructions given to you for any specific questions about what was found during the examination. If this information does not answer your questions, please call [place] office at [phone] to clarify.   YOU SHOULD EXPECT: Some feelings of bloating in the abdomen. Passage of more gas than usual. Walking can help get rid of the air that was put into your GI tract during the procedure and reduce the bloating. If you had a lower endoscopy (such as a colonoscopy or flexible sigmoidoscopy) you may notice spotting of blood in your stool or on the toilet paper. Some abdominal soreness may be present for a day or two, also.  DIET: Your first meal following the procedure should be a light meal and then it is ok to progress to your normal diet. A half-sandwich or bowl of soup is an example of a good first meal. Heavy or fried foods are harder to digest and may make you feel nauseous or bloated. Drink plenty of fluids but you should avoid alcoholic beverages for 24 hours. If you had a esophageal dilation, please see attached instructions for diet.    ACTIVITY: Your care partner should take you home directly after the procedure. You should plan to take it easy, moving slowly for the rest of the day. You can resume normal activity the day after the procedure however YOU SHOULD NOT DRIVE, use power tools, machinery or perform tasks that involve climbing or major physical exertion for 24 hours (because of the sedation medicines used during the test).   SYMPTOMS TO REPORT IMMEDIATELY: A gastroenterologist can be reached at any hour. Please call [phone]  for any of the following symptoms:   Following lower endoscopy (colonoscopy, flexible sigmoidoscopy) Excessive amounts of blood in the stool  Significant tenderness, worsening  of abdominal pains  Swelling of the abdomen that is new, acute  Fever of 100 or higher   FOLLOW UP:  If any biopsies were taken you will be contacted by phone or by letter within the next 1-3 weeks. Call [phone]  if you have not heard about the biopsies in 3 weeks.  Please also call with any specific questions about appointments or follow up tests.

## 2020-09-21 NOTE — Interval H&P Note (Signed)
History and Physical Interval Note:  09/21/2020 10:31 AM  Brandon [name]  has presented today for surgery, with the diagnosis of positive cologuard, difficult airway, antiplatelet use.  The various methods of treatment have been discussed with the patient and family. After consideration of risks, benefits and other options for treatment, the patient has consented to  Procedure(s): COLONOSCOPY WITH PROPOFOL (N/A) as a surgical intervention.  The patient's history has been reviewed, patient examined, no change in status, stable for surgery.  I have reviewed the patient's chart and labs.  Questions were answered to the patient's satisfaction.     Dominic Pea Trixy Loyola

## 2020-09-21 NOTE — Anesthesia Postprocedure Evaluation (Signed)
Anesthesia Post Note  Patient: LINKIN VIZZINI  Procedure(s) Performed: COLONOSCOPY WITH PROPOFOL (N/A ) POLYPECTOMY HEMOSTASIS CLIP PLACEMENT     Patient location during evaluation: PACU Anesthesia Type: MAC Level of consciousness: awake and alert Pain management: pain level controlled Vital Signs Assessment: post-procedure vital signs reviewed and stable Respiratory status: spontaneous breathing, nonlabored ventilation, respiratory function stable and patient connected to nasal cannula oxygen Cardiovascular status: stable and blood pressure returned to baseline Postop Assessment: no apparent nausea or vomiting Anesthetic complications: no   No complications documented.  Last Vitals:  Vitals:   09/21/20 1250 09/21/20 1300  BP: (!) 97/59 (!) 123/57  Pulse: 90 84  Resp: 19 (!) 25  Temp:    SpO2: 95% 93%    Last Pain:  Vitals:   09/21/20 1240  TempSrc:   PainSc: 0-No pain                 Tiajuana Amass

## 2020-09-22 ENCOUNTER — Encounter (HOSPITAL_COMMUNITY): Payer: Self-pay | Admitting: Gastroenterology

## 2020-09-22 LAB — SURGICAL PATHOLOGY

## 2020-09-23 ENCOUNTER — Encounter: Payer: Self-pay | Admitting: Gastroenterology

## 2020-10-20 ENCOUNTER — Telehealth: Payer: Self-pay | Admitting: Pulmonary Disease

## 2020-10-20 NOTE — Telephone Encounter (Signed)
Message received from [facility] stating pt was called on 11/11 and advised order was received and called again on 11/17 and advised order was ready but due to  national shortage they were unable to sched at this time and was notified they would contact him when items came in and were available.  He is going to request someone to reach out to pt again to provide update. I called pt & spoke to his wife and made her aware someone from [employer] will be calling them.  Nothing further needed.

## 2020-10-20 NOTE — Telephone Encounter (Addendum)
I sent community message to [employer] to check status of order.

## 2020-11-16 ENCOUNTER — Encounter (HOSPITAL_COMMUNITY): Payer: 59

## 2020-11-16 ENCOUNTER — Ambulatory Visit: Payer: 59

## 2020-11-25 DIAGNOSIS — Z0289 Encounter for other administrative examinations: Secondary | ICD-10-CM

## 2020-12-01 ENCOUNTER — Other Ambulatory Visit: Payer: Self-pay

## 2020-12-01 ENCOUNTER — Ambulatory Visit (INDEPENDENT_AMBULATORY_CARE_PROVIDER_SITE_OTHER): Payer: 59 | Admitting: Physician Assistant

## 2020-12-01 ENCOUNTER — Ambulatory Visit (HOSPITAL_COMMUNITY)
Admission: RE | Admit: 2020-12-01 | Discharge: 2020-12-01 | Disposition: A | Discharge status: Home / Self Care | Payer: 59 | Source: Ambulatory Visit | Attending: Physician Assistant | Admitting: Physician Assistant

## 2020-12-01 VITALS — BP 149/89 | HR 76 | Temp 98.2°F | Resp 20 | Ht 69.0 in | Wt 300.8 lb

## 2020-12-01 DIAGNOSIS — I708 Atherosclerosis of other arteries: Secondary | ICD-10-CM | POA: Diagnosis not present

## 2020-12-01 DIAGNOSIS — I6522 Occlusion and stenosis of left carotid artery: Secondary | ICD-10-CM

## 2020-12-01 NOTE — Progress Notes (Signed)
Established Carotid Patient   History of Present Illness   Brandon [name] is a 50 y.o. (1971/02/27) male who experienced a left brain stroke in February 2021.  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  He underwent left carotid subclavian bypass with Dr. Oneida Alar on 02/09/2020.  Since that time he denies any diagnosis of CVA or TIA.  He also denies any current strokelike symptoms including slurring speech, changes in vision, or one-sided weakness.  He denies any trouble from his left arm.  He is taking an aspirin and Plavix daily.  He is on Repatha for cholesterol.  The patient's PMH, PSH, SH, and FamHx were reviewed and are unchanged from prior visit.  Current Outpatient Medications  Medication Sig Dispense Refill  . acetaminophen (TYLENOL) 500 MG tablet Take 1,000 mg by mouth every 6 (six) hours as needed for mild pain or moderate pain.    Marland Kitchen albuterol (VENTOLIN HFA) 108 (90 Base) MCG/ACT inhaler Inhale 2 puffs into the lungs every 6 (six) hours as needed. 18 g 5  . aspirin 325 MG EC tablet Take 325 mg by mouth daily.    Marland Kitchen atorvastatin (LIPITOR) 80 MG tablet Take 1 tablet (80 mg total) by mouth daily. (Patient taking differently: Take 80 mg by mouth daily at 12 noon.) 90 tablet 3  . clopidogrel (PLAVIX) 75 MG tablet Take 1 tablet (75 mg total) by mouth daily. 90 tablet 3  . Evolocumab (REPATHA SURECLICK) 756 MG/ML SOAJ Inject 1 Dose into the skin every 14 (fourteen) days. (Patient taking differently: Inject 140 mg into the skin every 14 (fourteen) days.) 2 pen 11  . ezetimibe (ZETIA) 10 MG tablet Take 1 tablet (10 mg total) by mouth daily. 90 tablet 3   No current facility-administered medications for this visit.    REVIEW OF SYSTEMS (negative unless checked):   Cardiac:  []  Chest pain or chest pressure? []  Shortness of breath upon activity? []  Shortness of breath when lying flat? []  Irregular heart rhythm?  Vascular:  []  Pain in calf, thigh, or hip  brought on by walking? []  Pain in feet at night that wakes you up from your sleep? []  Blood clot in your veins? []  Leg swelling?  Pulmonary:  []  Oxygen at home? []  Productive cough? []  Wheezing?  Neurologic:  []  Sudden weakness in arms or legs? []  Sudden numbness in arms or legs? []  Sudden onset of difficult speaking or slurred speech? []  Temporary loss of vision in one eye? []  Problems with dizziness?  Gastrointestinal:  []  Blood in stool? []  Vomited blood?  Genitourinary:  []  Burning when urinating? []  Blood in urine?  Psychiatric:  []  Major depression  Hematologic:  []  Bleeding problems? []  Problems with blood clotting?  Dermatologic:  []  Rashes or ulcers?  Constitutional:  []  Fever or chills?  Ear/Nose/Throat:  []  Change in hearing? []  Nose bleeds? []  Sore throat?  Musculoskeletal:  []  Back pain? []  Joint pain? []  Muscle pain?   Physical Examination   Vitals:   12/01/20 0951 12/01/20 0953  BP: (!) 155/90 (!) 149/89  Pulse: 76   Resp: 20   Temp: 98.2 F (36.8 C)   TempSrc: Temporal   SpO2: 94%   Weight: (!) 300 lb 12.8 oz (136.4 kg)   Height: 5\' 9"  (1.753 m)    Body mass index is 44.42 kg/m.  General:  WDWN in NAD; vital signs documented above Gait: Not observed HENT: WNL, normocephalic Pulmonary: normal non-labored  breathing Cardiac: regular HR Abdomen: soft, NT, no masses Skin: without rashes Vascular Exam/Pulses:  Right Left  Radial 2+ (normal) 2+ (normal)   Extremities: without ischemic changes, without Gangrene , without cellulitis; without open wounds;  Musculoskeletal: no muscle wasting or atrophy  Neurologic: A&O X 3;  No focal weakness or paresthesias are detected Psychiatric:  The pt has Normal affect.  Non-Invasive Vascular Imaging   B Carotid Duplex :   R ICA stenosis:  1-39%  R VA:  patent and antegrade  L ICA stenosis:  Occluded  L VA:  patent and bidirectional flow   Medical Decision Making   Brandon [name] is a 50 y.o. male who presents for surveillance of carotid artery stenosis   Right ICA unchanged with 1 to 39% stenosis; left ICA is known to be occluded  Patient has bidirectional flow in the left vertebral artery however has a palpable left radial pulse and denies any subclavian steal symptoms  Continue aspirin and Plavix daily  Recheck carotid duplex in 1 year   Dagoberto Ligas PA-C [facility] Office: [phone]  Clinic MD: Oneida Alar

## 2020-12-15 ENCOUNTER — Other Ambulatory Visit: Payer: Self-pay | Admitting: Family Medicine

## 2020-12-15 DIAGNOSIS — E785 Hyperlipidemia, unspecified: Secondary | ICD-10-CM

## 2021-01-11 ENCOUNTER — Telehealth: Payer: Self-pay | Admitting: Family Medicine

## 2021-01-11 DIAGNOSIS — Z1152 Encounter for screening for COVID-19: Secondary | ICD-10-CM

## 2021-01-11 NOTE — Telephone Encounter (Signed)
Pt going on a cruise.  He needs a RAPID PCR which [facility] has.  Will you place an order for him to have a RAPID PCR.  The results will need to be emailed and I am awaiting the email address.  He needs this on April 8th.

## 2021-01-12 NOTE — Telephone Encounter (Signed)
done

## 2021-01-21 DIAGNOSIS — G4733 Obstructive sleep apnea (adult) (pediatric): Secondary | ICD-10-CM | POA: Diagnosis not present

## 2021-02-02 DIAGNOSIS — G4733 Obstructive sleep apnea (adult) (pediatric): Secondary | ICD-10-CM | POA: Diagnosis not present

## 2021-02-14 DIAGNOSIS — Z0271 Encounter for disability determination: Secondary | ICD-10-CM

## 2021-02-20 DIAGNOSIS — G4733 Obstructive sleep apnea (adult) (pediatric): Secondary | ICD-10-CM | POA: Diagnosis not present

## 2021-02-24 ENCOUNTER — Telehealth: Payer: Self-pay | Admitting: Cardiovascular Disease

## 2021-02-24 NOTE — Telephone Encounter (Signed)
Pt c/o medication issue:  1. Name of Medication: Evolocumab (REPATHA SURECLICK) 409 MG/ML SOAJ  2. How are you currently taking this medication (dosage and times per day)? As prescribed  3. Are you having a reaction (difficulty breathing--STAT)? No  4. What is your medication issue? PT's insurance called in stating the medication is not on the anxcillary list with insurance. The replacements that would be covered is Praluent.Insurance is wanting to know if this medication can be changed to what they would cover.

## 2021-02-24 NOTE — Telephone Encounter (Signed)
This is actually a dr. Debara Pickett pt so I will route it to [place] elkins rn

## 2021-02-24 NOTE — Telephone Encounter (Signed)
MyChart message sent to patient regarding change needed, per insurance. Awaiting reply

## 2021-03-01 MED ORDER — PRALUENT 150 MG/ML ~~LOC~~ SOAJ: 1.0000 | SUBCUTANEOUS | 11 refills | Status: DC

## 2021-03-01 NOTE — Telephone Encounter (Signed)
Spoke with patient and wife He recently changed insurance to [employer] - disability Explained that his new insurance covers [employer] and not [employer] surveyor sent to pharmacy Explained how to apply for [employer] No further assistance needed at this time.

## 2021-03-02 NOTE — Telephone Encounter (Signed)
Patient due for lipid clinic appointment in Oct 2022 Will need fasting labs prior to this appointment

## 2021-03-02 NOTE — Telephone Encounter (Signed)
ID: [id number]

## 2021-03-03 ENCOUNTER — Telehealth: Payer: Self-pay | Admitting: Family Medicine

## 2021-03-03 NOTE — Telephone Encounter (Signed)
Brandon [name] called to advise pt had COVID.  Tested positive today.  Sx stated 5/7, 2 days after returning home from cruise. Pt is getting better, using OTC meds.

## 2021-03-04 DIAGNOSIS — G4733 Obstructive sleep apnea (adult) (pediatric): Secondary | ICD-10-CM | POA: Diagnosis not present

## 2021-03-07 ENCOUNTER — Telehealth: Payer: Self-pay | Admitting: Internal Medicine

## 2021-03-07 ENCOUNTER — Telehealth: Payer: Self-pay | Admitting: Cardiovascular Disease

## 2021-03-07 NOTE — Telephone Encounter (Signed)
Pt c/o medication issue:  1. Name of Medication: Alirocumab (PRALUENT) 150 MG/ML SOAJ  2. How are you currently taking this medication (dosage and times per day)?as prescribed   3. Are you having a reaction (difficulty breathing--STAT)? No   4. What is your medication issue? Pt is calling stating that  A copy of the script with the doctors signature to [phone] to get a lower copay for the medication.

## 2021-03-07 NOTE — Telephone Encounter (Signed)
Returned call to patient wife. She said she applied for [employer]. Explained I just received approval letter today. Provided her with all the pharmacy card information and explained they need to call [facility], who will order med if needed - Rx will be processed on primary insurance and then co-pays deducted from grant funds. No further assistance needed.

## 2021-03-07 NOTE — Telephone Encounter (Signed)
Patient approved for $2500 co-pay assistance grant - hypercholesterolemia - from [place] ID: [id number] Dates: 02/01/21 - 01/31/22  Pharmacy Card ID: [id number] PC Group: [id number] PC BIN: [id number] PC PCN: [id number] PC Processor: PDMI

## 2021-03-07 NOTE — Telephone Encounter (Signed)
Praluent PA submitted via CMM Key: [id number] - PA Case ID: [id number] [place]

## 2021-03-07 NOTE — Telephone Encounter (Signed)
approved the requested coverage for the following prescription drug(s): PRALUENT [employer] Type of coverage approved: Prior Authorization This approval authorizes your coverage from 01/21/2021 - 10/22/2021

## 2021-03-15 IMAGING — XA IR PERCUTANEOUS ART THORMBECTOMY/INFUSION INTRACRANIAL INCLUDE D
1 series · 16 of 24 positions shown · non-contrast
Comparison: Diagnostic catheter arteriogram December 14, 2019.

INDICATION: Worsening neurological symptoms with dysarthria, right-sided
numbness and weakness with difficulty with expression.

EXAM:
1. EMERGENT LARGE VESSEL OCCLUSION THROMBOLYSIS anterior
CIRCULATION)
TECHNIQUE: Following a full explanation of the procedure along with the
potential associated complications, an informed witnessed consent
was obtained patient and spouse. The risks of intracranial
hemorrhage of 10%, worsening neurological deficit, ventilator
dependency, death and inability to revascularize were all reviewed
in detail with the patient's spouse.

[Series 12: post axial · axial · 2.5mm · 0.38mm/px · z∈[-249,-81]mm · 16 of 31 slices shown]
[im 1/31]
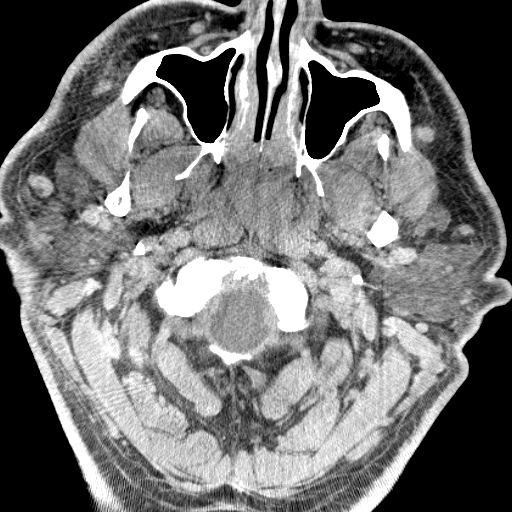
[im 3/31]
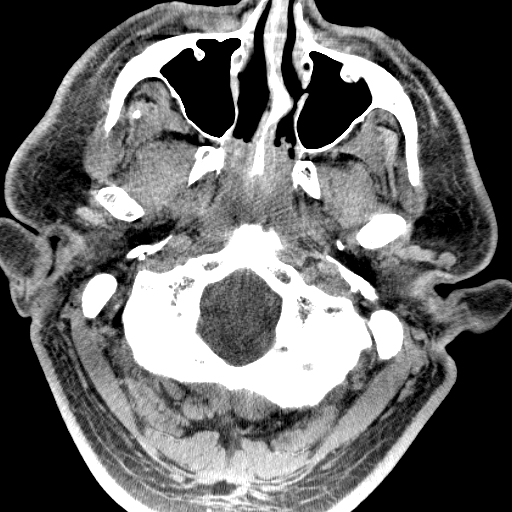
[im 4/31]
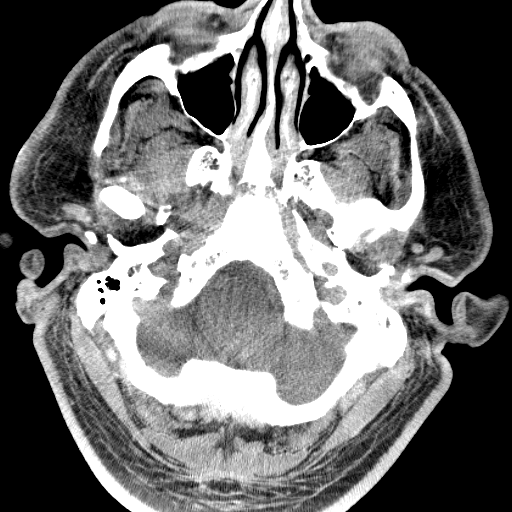
[im 7/31]
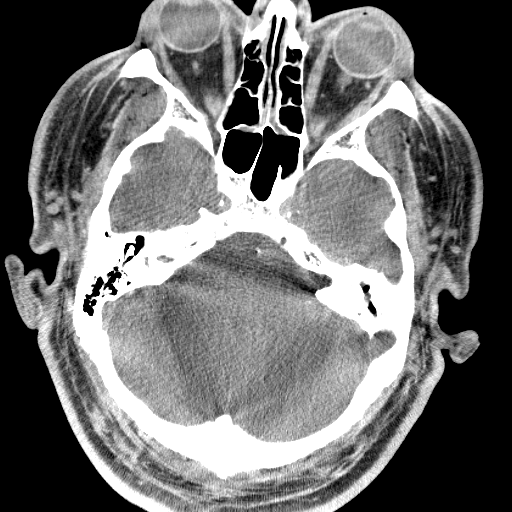
[im 8/31]
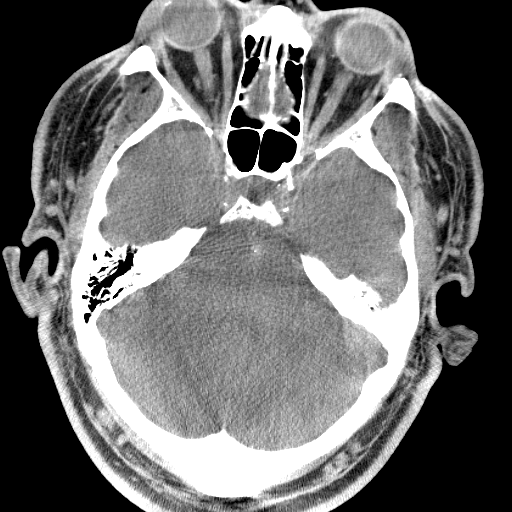
[im 11/31]
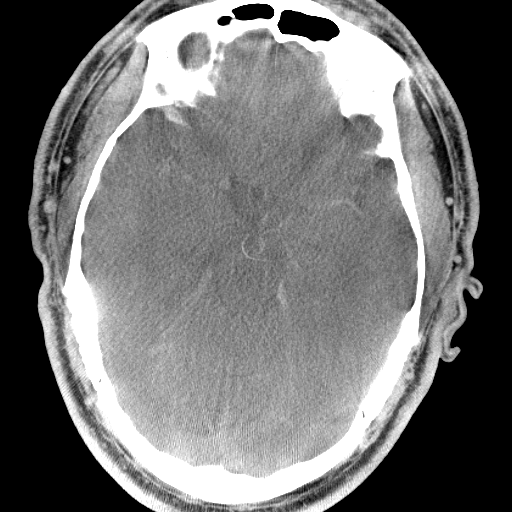
[im 12/31]
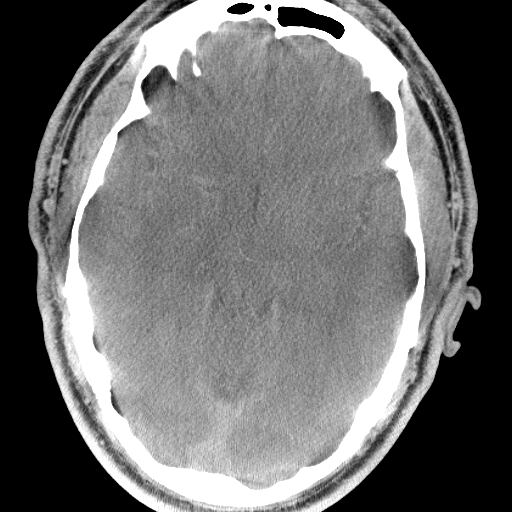
[im 15/31]
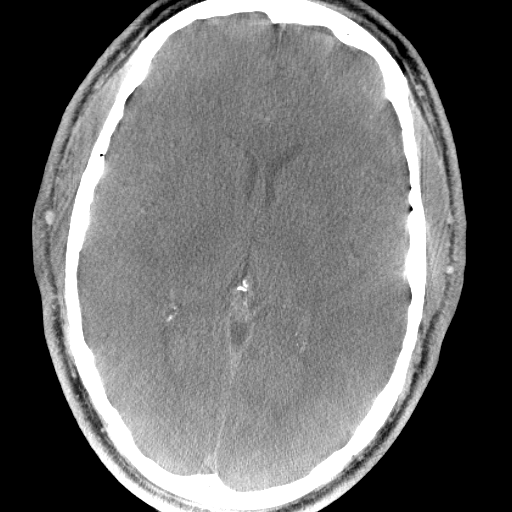
[im 16/31]
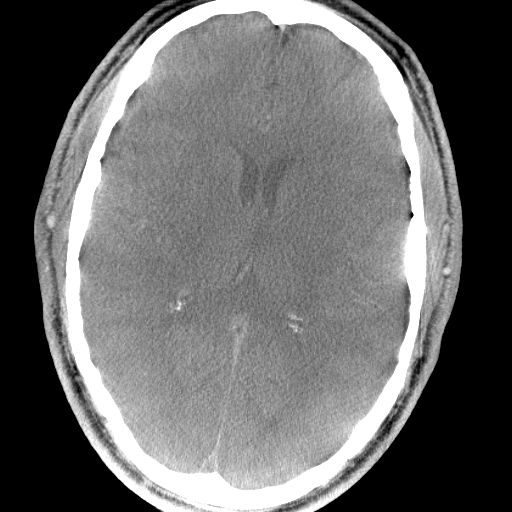
[im 19/31]
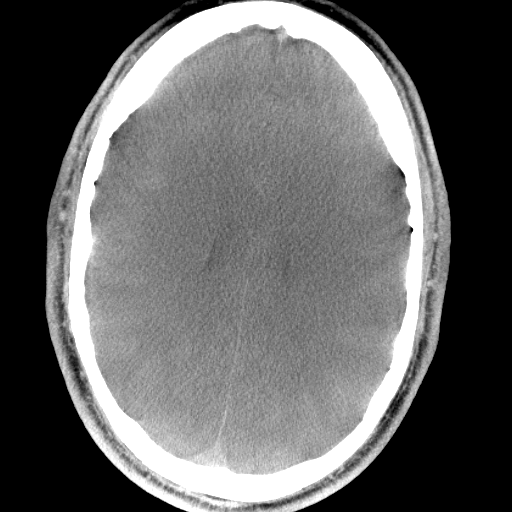
[im 20/31]
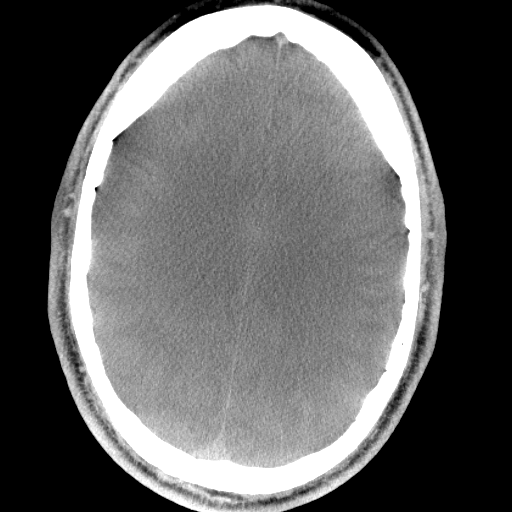
[im 23/31]
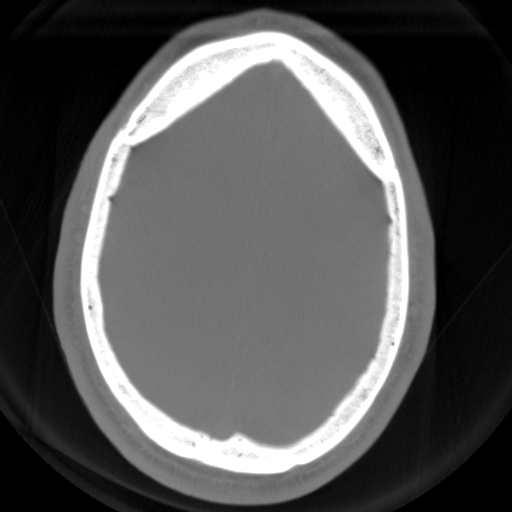
[im 24/31]
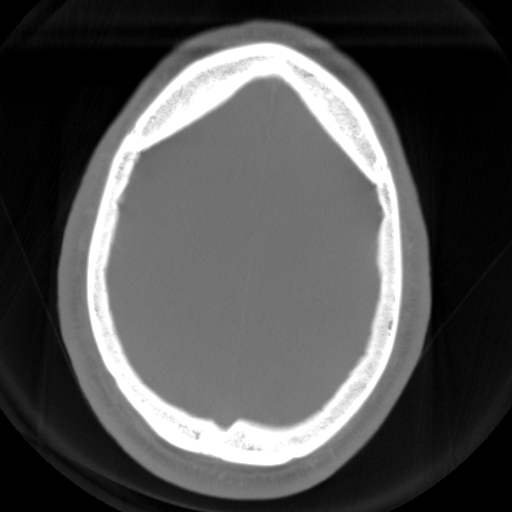
[im 27/31]
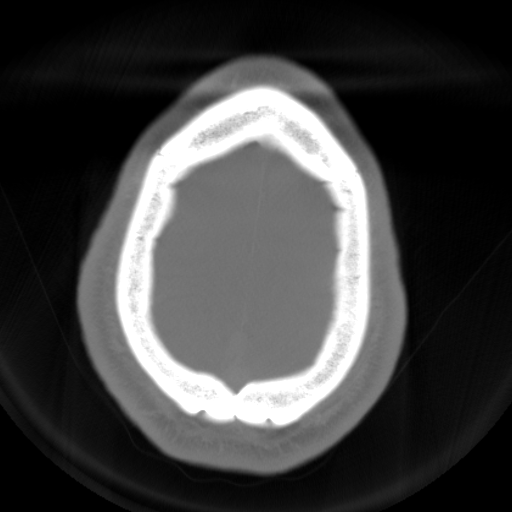
[im 28/31]
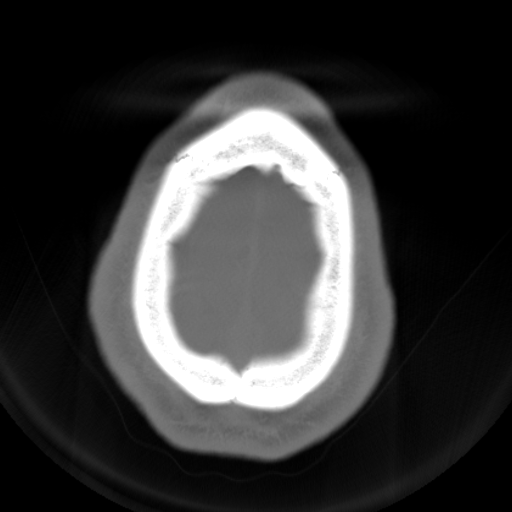
[im 31/31]
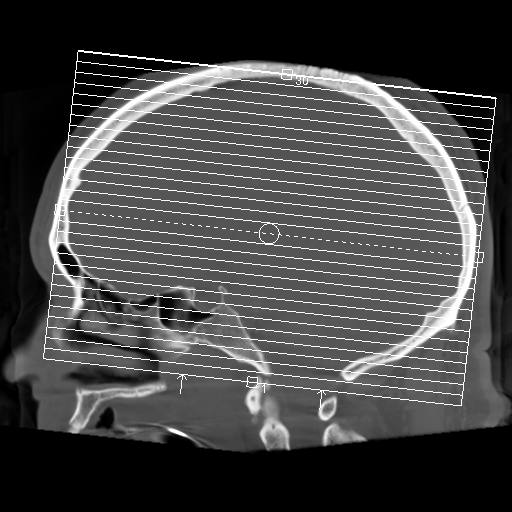

[16 of 24 positions shown; findings below may reference images not displayed]

MEDICATIONS:
Ancef 2 g IV antibiotic was administered within 1 hour of the
procedure.

ANESTHESIA/SEDATION:
General anesthesia.

CONTRAST:  Isovue 300 approximately 60 mL.

FLUOROSCOPY TIME:  Fluoroscopy Time: 52 minutes 30 seconds (4662
mGy).

COMPLICATIONS:
None immediate.
The patient was then put under general anesthesia by the [REDACTED] at [HOSPITAL].

The right groin was prepped and draped in the usual sterile fashion.
Thereafter using modified Seldinger technique, transfemoral access
into the right common femoral artery was obtained without
difficulty. Over a 0.035 inch guidewire a 8 French 23 cm Brite tip
sheath was inserted. Through this, and also over a 0.035 inch
guidewire a 5 French JB 1 catheter was advanced to the aortic arch
region and selectively positioned in the left common carotid artery
and the left subclavian artery.
FINDINGS: The left common carotid arteriogram continues to demonstrate
angiographically occluded left internal carotid artery at the bulb
without evidence of a delayed string sign.

Reconstitution of the distal left internal carotid artery and the
cavernous and supraclinoid segment is again demonstrated from the
left external carotid artery collaterals via the ophthalmic artery.

PROCEDURE:
The diagnostic JB 1 catheter in the left internal carotid artery was
exchanged over a 0.035 inch 300 cm Rosen exchange guidewire for a 95
cm 0.087 balloon guide catheter which had been prepped with 50%
contrast and 50% heparinized saline infusion.

The balloon guide was then advanced just proximal to the left common
carotid artery bifurcation. The guidewire was removed. Good
aspiration was obtained from the hub of the balloon guide catheter.
Using biplane roadmap technique, initially an 014 inch standard
Synchro micro guidewire was advanced inside of an 021 Trevo ProVue
microcatheter.

Multiple attempts were made using a torque device to advance and
access the occluded left internal carotid artery proximally without
success.

The wire was then replaced with an 016 Fathom micro guidewire.

Again with the microcatheter tip right against the wall of the
occluded left internal carotid artery at the bulb, and with the
balloon guide advanced to abut the occluded left internal carotid
artery at the bulb, multiple attempts were made to access the
occluded left internal carotid artery without success.

This system was then replaced with a 5 French 125 cm slip cath. This
was then advanced over a 0.0035 Roadrunner guidewire and directed
against the wall of the left internal carotid artery at the bulb.

Multiple attempts were then made to access the occluded left
internal carotid artery initially with an 035 inch Roadrunner wire,
and a glide guidewire without success.

Finally attempts were made to advance the curved end of the slip
cath into the occluded left internal carotid artery at the bulb
again without success. The balloon guide was then removed.

Over a 0.035 inch Roadrunner guidewire, a JB 1 5 French diagnostic
catheter was then advanced and positioned in the occluded left
subclavian artery.

Wire manipulation and catheter manipulation combination was then
performed using a 0.035 inch Roadrunner guidewire, a glide wire, and
finally an 016 Fathom micro guidewire without success.

The diagnostic catheter was then retrieved and removed.

The 8 French Brite tip 23 cm sheath was then removed with the
successful application of a 7 French ExoSeal closure device.

The right groin appeared soft. Distal pulses remained Dopplerable in
the dorsalis pedis, and the posterior tibial regions bilaterally
unchanged.

A flat panel CT of the brain revealed no evidence of mass effect,
midline shift or of intracranial hemorrhage.

The patient's general anesthesia was then reversed and the patient
was then extubated without difficulty. Upon recovery, the patient
was able to moved all fours equally, and also responded
appropriately.

The patient was then returned to neuro ICU in stable condition for
further management.
IMPRESSION: Status post unsuccessful attempt at recanalization of the left
internal carotid artery proximally, and also of the left subclavian
artery at its proximal occlusion.

PLAN:
Follow-up in the clinic 4 weeks post discharge.

## 2021-04-21 ENCOUNTER — Other Ambulatory Visit: Payer: Self-pay | Admitting: Family Medicine

## 2021-04-21 DIAGNOSIS — G4733 Obstructive sleep apnea (adult) (pediatric): Secondary | ICD-10-CM | POA: Diagnosis not present

## 2021-04-21 DIAGNOSIS — E785 Hyperlipidemia, unspecified: Secondary | ICD-10-CM

## 2021-04-28 ENCOUNTER — Telehealth: Payer: Self-pay | Admitting: Internal Medicine

## 2021-04-28 NOTE — Telephone Encounter (Signed)
Received letter from [facility] that [place] 150mg /mL is non-formulary Alternative drug is Repatha Sureclick   Per chart review, patient was previously on [employer], [employer], thus formulary changed and PCSK9 was changed to [place] (02/24/21 phone note)  Left message to call back to discuss if he has changed insurance plans and if so, would need ID, BIN, PCN, RxGrp

## 2021-04-28 NOTE — Telephone Encounter (Signed)
Patient sent message that he has changed insurance companies, thus formulary has changed and he will need repatha instead of praluent  PA for repatha submitted via CMM   (Key: [place])

## 2021-04-29 ENCOUNTER — Other Ambulatory Visit: Payer: Self-pay | Admitting: Family Medicine

## 2021-05-02 MED ORDER — REPATHA SURECLICK 140 MG/ML ~~LOC~~ SOAJ: 1.0000 | SUBCUTANEOUS | 5 refills | Status: DC

## 2021-05-02 NOTE — Addendum Note (Signed)
Addended by: Fidel Levy on: 05/02/2021 10:08 AM   Modules accepted: Orders

## 2021-05-02 NOTE — Telephone Encounter (Signed)
Repatha PA approved until 10/22/21

## 2021-05-02 NOTE — Addendum Note (Signed)
Addended by: Fidel Levy on: 05/02/2021 01:06 PM   Modules accepted: Orders

## 2021-05-03 DIAGNOSIS — G4733 Obstructive sleep apnea (adult) (pediatric): Secondary | ICD-10-CM | POA: Diagnosis not present

## 2021-05-12 ENCOUNTER — Telehealth: Payer: Self-pay | Admitting: Pulmonary Disease

## 2021-05-12 NOTE — Telephone Encounter (Signed)
Spoke with the pt's spouse  Pt overdue f/u and according to airview not being very compliant  She states this is due to recent oral surgery  I scheduled him for appt with TP for 05/30/21 I advised for pt to try and use machine as much as possible in the meantime

## 2021-05-20 ENCOUNTER — Other Ambulatory Visit: Payer: Self-pay | Admitting: Internal Medicine

## 2021-05-30 ENCOUNTER — Telehealth: Payer: Self-pay | Admitting: Pulmonary Disease

## 2021-05-30 ENCOUNTER — Ambulatory Visit: Payer: 59 | Admitting: Adult Health

## 2021-05-30 NOTE — Telephone Encounter (Signed)
Call returned to patient, spoke with wife, confirmed patient DOB. Patient is refusing to wear his cpap due to being uncomfortable. He has tried several different types of masks and just does not feel he can wear the machine. He states he just cannot wear it. Wife is wanting to know what other option the patient has. I attempted to inquire as to whether patient is open to trying a different type of mask or open to any other suggestions at all and patient sounded very apathetic.   RA please advise. Is patient a candidate for oral device or inspire? Thanks :)

## 2021-05-30 NOTE — Telephone Encounter (Signed)
Called and spoke with Hilda Blades (DPR).  Hilda Blades stated Patient cancelled OV for 05/30/21, because he wasn't feeling well.  Offered to reschedule OV, but Hilda Blades declined at this time. Hilda Blades stated Patient has tried mask fittings and different mask, but he is unable to wear any of them.  Advised about possible oral devise to help with osa.  Hilda Blades stated Patient has just gotten dentures and is having issues with dentures in his mouth. Hilda Blades stated Patient was going to turn bipap into DME. Advised Debra to call office to schedule OV if Patient decides to continue with bipap or osa alternatives.  Understanding stated.  Nothing further at this time.

## 2021-05-31 ENCOUNTER — Other Ambulatory Visit: Payer: Self-pay

## 2021-05-31 ENCOUNTER — Ambulatory Visit: Payer: Medicare HMO | Admitting: Cardiovascular Disease

## 2021-05-31 ENCOUNTER — Encounter: Payer: Self-pay | Admitting: Cardiovascular Disease

## 2021-05-31 DIAGNOSIS — I2510 Atherosclerotic heart disease of native coronary artery without angina pectoris: Secondary | ICD-10-CM | POA: Diagnosis not present

## 2021-05-31 DIAGNOSIS — G4730 Sleep apnea, unspecified: Secondary | ICD-10-CM | POA: Diagnosis not present

## 2021-05-31 DIAGNOSIS — I712 Thoracic aortic aneurysm, without rupture, unspecified: Secondary | ICD-10-CM

## 2021-05-31 DIAGNOSIS — I6522 Occlusion and stenosis of left carotid artery: Secondary | ICD-10-CM

## 2021-05-31 DIAGNOSIS — E782 Mixed hyperlipidemia: Secondary | ICD-10-CM | POA: Diagnosis not present

## 2021-05-31 DIAGNOSIS — E785 Hyperlipidemia, unspecified: Secondary | ICD-10-CM | POA: Diagnosis not present

## 2021-05-31 DIAGNOSIS — Z720 Tobacco use: Secondary | ICD-10-CM | POA: Diagnosis not present

## 2021-05-31 MED ORDER — ASPIRIN EC 81 MG PO TBEC: 81.0000 mg | DELAYED_RELEASE_TABLET | Freq: Every day | ORAL | 3 refills | Status: DC

## 2021-05-31 MED ORDER — EZETIMIBE 10 MG PO TABS
10.0000 mg | ORAL_TABLET | Freq: Every day | ORAL | 3 refills | Status: DC
Start: 2021-05-31 — End: 2022-07-24

## 2021-05-31 MED ORDER — CLOPIDOGREL BISULFATE 75 MG PO TABS
75.0000 mg | ORAL_TABLET | Freq: Every day | ORAL | 3 refills | Status: DC
Start: 2021-05-31 — End: 2022-12-15

## 2021-05-31 MED ORDER — REPATHA SURECLICK 140 MG/ML ~~LOC~~ SOAJ: 1.0000 | SUBCUTANEOUS | 5 refills | Status: DC

## 2021-05-31 MED ORDER — ATORVASTATIN CALCIUM 80 MG PO TABS: 80.0000 mg | ORAL_TABLET | Freq: Every day | ORAL | 3 refills | Status: DC

## 2021-05-31 NOTE — Assessment & Plan Note (Signed)
History of CAD status post inferior wall myocardial infarction 2006 treated with RCA stenting.  He was recathed later and found to have "in-stent restenosis and was intervened on.  I cathed him 02/24/2010 in the setting of non-STEMI revealing a new 75% in-stent restenosis within the RCA and a new 99% proximal lesion both of which were intervened on.  He currently denies chest pain or shortness of breath.

## 2021-05-31 NOTE — Assessment & Plan Note (Signed)
Brandon [name] continues to smoke three quarters of a pack a day down from 1 pack a day a year ago.  He is recalcitrant to risk factor modification.

## 2021-05-31 NOTE — Assessment & Plan Note (Signed)
History of essential hypertension a blood pressure measured today at 134/88.  He is not on antihypertensive medication.

## 2021-05-31 NOTE — Assessment & Plan Note (Signed)
History of morbid obesity with a BMI of 44.  He is open to pursuing diet and weight reduction.  I am referring him to Dr. Leafy Ro to facilitate.

## 2021-05-31 NOTE — Assessment & Plan Note (Addendum)
History of hyperlipidemia on Repatha ,  Zetia, and atorvastatin with lipid profile performed 08/23/2020 revealing total cholesterol 126, LDL 59 HDL 36.

## 2021-05-31 NOTE — Assessment & Plan Note (Signed)
History of obstructive sleep apnea intolerant to CPAP 

## 2021-05-31 NOTE — Progress Notes (Signed)
05/31/2021 BRIEN [name]   Jun 02, 1971  [id number]  Primary Physician Denita Lung, MD Primary Cardiologist: Lorretta Harp MD Lupe Carney, [place]  HPI:  Brandon [name] is a 50 y.o.  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  He was initially referred by Dr. Redmond School because of lower extremity edema which has since resolved after changing jobs.  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 His risk factors include 1-1/2 packs a day of tobacco abuse having smoked 35 years as well as treated hypertension and hyperlipidemia. His father had a heart attack at age 82 and died. 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 He was recathed here later found to have in-stent restenosis as well as recently intervened on. I Him 02/24/10 the setting of a non-STEMI revealed revealing a new 75% in-stent restenosis within the RCA stent and a new 99% proximal lesion both of which were intervened on. He's had no problems since.   I have not seen him for close to 2 years.  He did run out of his statin drug for 6 months and had a lipid profile done a month ago revealing total cholesterol 276, LDL 194 and HDL of 37.  He apparently has been put back on a statin drug.  He denies chest pain or shortness of breath.  He does continue to smoke a pack a day however.   He had a ipid profile performed 12/04/2019 revealing a total cholesterol 215, LDL 146 and HDL 42 on atorvastatin 80 mg a day.  He is clearly not at goal for secondary prevention and we discussed starting Repatha.  In addition, he did have carotid Doppler studies because of an auscultated bruit that revealed a total left carotid with normal right carotid artery.  He is complaining of some right  upper extremity numbness.  Is unclear whether this is radicular or neurologic from his carotid disease and he probably would benefit from seeing a neurologist.  We talked about risk factor modification including smoking cessation.  He still smokes 1 pack/day.   He did have an unsuccessful attempt at left common carotid and left subclavian artery stenting by interventional radiology.  He has started Repatha in addition to high-dose atorvastatin and Zetia.  Since I saw him a year ago he continues to do well.  He denies chest pain or shortness of breath.  He does have obstructive sleep apnea intolerant to CPAP.  His weight has remained stable and we talked about physician facilitated weight reduction.  Current Meds  Medication Sig   acetaminophen (TYLENOL) 500 MG tablet Take 1,000 mg by mouth every 6 (six) hours as needed for mild pain or moderate pain.   albuterol (VENTOLIN HFA) 108 (90 Base) MCG/ACT inhaler Inhale 2 puffs into the lungs every 6 (six) hours as needed.   aspirin EC 81 MG tablet Take 1 tablet (81 mg total) by mouth daily. Swallow whole.   atorvastatin (LIPITOR) 80 MG tablet Take 1 tablet by mouth once daily   clopidogrel (PLAVIX) 75 MG tablet Take 1 tablet by mouth once daily   Evolocumab (REPATHA SURECLICK) XX123456 MG/ML SOAJ Inject 1 Dose into the skin every 14 (fourteen) days.   ezetimibe (  ZETIA) 10 MG tablet Take 1 tablet by mouth once daily   [DISCONTINUED] aspirin 325 MG EC tablet Take 325 mg by mouth daily.     No Known Allergies  Social History   Socioeconomic History   Marital status: Married    Spouse name: Not on file   Number of children: 2   Years of education: Not on file   Highest education level: Not on file  Occupational History   Occupation: Disabled    Comment: Previously employed as an Programmer, systems  Tobacco Use   Smoking status: Every Day    Packs/day: 1.00    Years: 34.00    Pack years: 34.00    Types: Cigarettes   Smokeless tobacco: Never   Vaping Use   Vaping Use: Never used  Substance and Sexual Activity   Alcohol use: No    Comment:  Alcoholism- quit 2002   Drug use: No   Sexual activity: Not on file  Other Topics Concern   Not on file  Social History Narrative            Social Determinants of Health   Financial Resource Strain: Not on file  Food Insecurity: Not on file  Transportation Needs: Not on file  Physical Activity: Not on file  Stress: Not on file  Social Connections: Not on file  Intimate Partner Violence: Not on file     Review of Systems: General: negative for chills, fever, night sweats or weight changes.  Cardiovascular: negative for chest pain, dyspnea on exertion, edema, orthopnea, palpitations, paroxysmal nocturnal dyspnea or shortness of breath Dermatological: negative for rash Respiratory: negative for cough or wheezing Urologic: negative for hematuria Abdominal: negative for nausea, vomiting, diarrhea, bright red blood per rectum, melena, or hematemesis Neurologic: negative for visual changes, syncope, or dizziness All other systems reviewed and are otherwise negative except as noted above.    Blood pressure 134/88, pulse 77, height '5\' 9"'$  (1.753 m), weight 300 lb (136.1 kg).  General appearance: alert and no distress Neck: no adenopathy, no carotid bruit, no JVD, supple, symmetrical, trachea midline, and thyroid not enlarged, symmetric, no tenderness/mass/nodules Lungs: clear to auscultation bilaterally Heart: regular rate and rhythm, S1, S2 normal, no murmur, click, rub or gallop Extremities: extremities normal, atraumatic, no cyanosis or edema Pulses: 2+ and symmetric Skin: Skin color, texture, turgor normal. No rashes or lesions Neurologic: Grossly normal  EKG sinus rhythm at 77 with inferior Q waves.  Personally reviewed this EKG.  ASSESSMENT AND PLAN:   Hyperlipidemia History of hyperlipidemia on Repatha ,  Zetia, and atorvastatin with lipid profile performed 08/23/2020  revealing total cholesterol 126, LDL 59 HDL 36.  Hypertension History of essential hypertension a blood pressure measured today at 134/88.  He is not on antihypertensive medication.  Morbid obesity ([place]) History of morbid obesity with a BMI of 44.  He is open to pursuing diet and weight reduction.  I am referring him to Dr. Leafy Ro to facilitate.  Arteriosclerotic cardiovascular disease (ASCVD) History of CAD status post inferior wall myocardial infarction 2006 treated with RCA stenting.  He was recathed later and found to have "in-stent restenosis and was intervened on.  I cathed him 02/24/2010 in the setting of non-STEMI revealing a new 75% in-stent restenosis within the RCA and a new 99% proximal lesion both of which were intervened on.  He currently denies chest pain or shortness of breath.  Tobacco abuse Mr. Motton continues to smoke three quarters of a pack a day down  from 1 pack a day a year ago.  He is recalcitrant to risk factor modification.  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  He has had an unsuccessful attempt at left common carotid and left subclavian artery stenting by interventional radiology.  Thoracic aortic aneurysm without rupture ([place]) CTA performed 02/19/2020 showed an ascending thoracic aorta measuring 4.1 cm.     Lorretta Harp MD FACP,FACC,FAHA, [facility] 05/31/2021 9:13 AM

## 2021-05-31 NOTE — Assessment & Plan Note (Signed)
CTA performed 02/19/2020 showed an ascending thoracic aorta measuring 4.1 cm.

## 2021-05-31 NOTE — Assessment & Plan Note (Addendum)
History of occluded left internal carotid with normal right internal carotid artery.  He has had an unsuccessful attempt at left common carotid and left subclavian artery stenting by interventional radiology.

## 2021-05-31 NOTE — Patient Instructions (Signed)
  Follow-Up: At [facility], you and your health needs are our priority.  As part of our continuing mission to provide you with exceptional heart care, we have created designated Provider Care Teams.  These Care Teams include your primary Cardiologist (physician) and Advanced Practice Providers (APPs -  Physician Assistants and Nurse Practitioners) who all work together to provide you with the care you need, when you need it.  We recommend signing up for the patient portal called "MyChart".  Sign up information is provided on this After Visit Summary.  MyChart is used to connect with patients for Virtual Visits (Telemedicine).  Patients are able to view lab/test results, encounter notes, upcoming appointments, etc.  Non-urgent messages can be sent to your provider as well.   To learn more about what you can do with MyChart, go to [email or web address].    Your next appointment:   6 month(s)  The format for your next appointment:   In Person  Provider:   You will see one of the following Advanced Practice Providers on your designated Care Team:     Jesse Cleaver, FNP  Then, Jonathan Berry, MD will plan to see you again in 12 month(s).     

## 2021-06-08 ENCOUNTER — Ambulatory Visit (INDEPENDENT_AMBULATORY_CARE_PROVIDER_SITE_OTHER): Payer: Medicare HMO | Admitting: Family Medicine

## 2021-06-08 ENCOUNTER — Other Ambulatory Visit: Payer: Self-pay

## 2021-06-08 VITALS — BP 128/86 | HR 83 | Temp 97.9°F | Ht 69.0 in | Wt 300.4 lb

## 2021-06-08 DIAGNOSIS — Z1159 Encounter for screening for other viral diseases: Secondary | ICD-10-CM

## 2021-06-08 DIAGNOSIS — Z720 Tobacco use: Secondary | ICD-10-CM | POA: Diagnosis not present

## 2021-06-08 DIAGNOSIS — Z8616 Personal history of COVID-19: Secondary | ICD-10-CM | POA: Insufficient documentation

## 2021-06-08 DIAGNOSIS — D125 Benign neoplasm of sigmoid colon: Secondary | ICD-10-CM

## 2021-06-08 DIAGNOSIS — G4733 Obstructive sleep apnea (adult) (pediatric): Secondary | ICD-10-CM | POA: Diagnosis not present

## 2021-06-08 DIAGNOSIS — F325 Major depressive disorder, single episode, in full remission: Secondary | ICD-10-CM | POA: Diagnosis not present

## 2021-06-08 DIAGNOSIS — E782 Mixed hyperlipidemia: Secondary | ICD-10-CM

## 2021-06-08 DIAGNOSIS — I2510 Atherosclerotic heart disease of native coronary artery without angina pectoris: Secondary | ICD-10-CM

## 2021-06-08 DIAGNOSIS — I252 Old myocardial infarction: Secondary | ICD-10-CM | POA: Diagnosis not present

## 2021-06-08 DIAGNOSIS — G4730 Sleep apnea, unspecified: Secondary | ICD-10-CM

## 2021-06-08 DIAGNOSIS — I712 Thoracic aortic aneurysm, without rupture, unspecified: Secondary | ICD-10-CM

## 2021-06-08 DIAGNOSIS — A630 Anogenital (venereal) warts: Secondary | ICD-10-CM

## 2021-06-08 DIAGNOSIS — Z23 Encounter for immunization: Secondary | ICD-10-CM

## 2021-06-08 DIAGNOSIS — I700 Atherosclerosis of aorta: Secondary | ICD-10-CM | POA: Diagnosis not present

## 2021-06-08 NOTE — Progress Notes (Signed)
   Subjective:    Patient ID: Brandon [name], male    DOB: [birth year], 50 y.o.   MRN: [id number]  HPI He is here for an interval evaluation.  He recently saw cardiology for follow-up on his underlying heart disease.  He continues on Repatha, Zetia and atorvastatin.  He was also set up to be seen by medical weight loss and wellness and has not heard from them to help with his weight reduction.  He is smoking three quarters of a pack of cigarettes and is interested in pursuing this further.  He does have OSA but has been unable to tolerate the CPAP.  He also has had a colonoscopy and multiple adenomatous polyps were seen.  He is scheduled for follow-up concerning that.  He also has a lesion on his penis that he wants evaluated.  He has had 2 COVID-vaccine as has a history of COVID. Review of the record also indicates thoracic aneurysm and recommendation for follow-up.  He has a previous history of depression but the present time seem to be doing quite nicely.  Also has x-ray atherosclerosis of the aorta.  Review of Systems     Objective:   Physical Exam Alert and in no distress. Tympanic membranes and canals are normal. Pharyngeal area is normal. Neck is supple without adenopathy or thyromegaly. Cardiac exam shows a regular sinus rhythm without murmurs or gallops. Lungs are clear to auscultation. Genital exam shows a pedunculated penile wart present on the shaft of the penis.       Assessment & Plan:  Arteriosclerotic cardiovascular disease (ASCVD)  Morbid obesity ([place])  Mixed hyperlipidemia  Tobacco abuse  Sleep apnea, unspecified type  Atherosclerosis of aorta ([facility])  Depression, major, in remission ([place])  OSA (obstructive sleep apnea)  History of MI (myocardial infarction)  Thoracic aortic aneurysm without rupture ([place])  Adenomatous polyp of sigmoid colon  History of COVID-19  Need for vaccination against Streptococcus pneumoniae  Genital warts His medications were  renewed by Dr. Alvester Chou.  Informed that if he does not hear from medical weight loss in several weeks call and we will see if we can intervene. Discussed smoking cessation with him and convinced him to cut down to half a pack of cigarettes per day and do this for a month and then down to a quarter of a pack per day.  Hopefully he will be able to follow this recommendation. I will give a pneumonia shot today.  Also discussed further COVID vaccinations but at this point he is not interested. Recommend he return here at his convenience for removal of the penile wart.

## 2021-06-09 LAB — HEPATITIS C ANTIBODY: Hep C Virus Ab: <0.1 s/co ratio (ref 0.0–0.9)

## 2021-06-15 ENCOUNTER — Other Ambulatory Visit (HOSPITAL_COMMUNITY)
Admission: RE | Admit: 2021-06-15 | Discharge: 2021-06-15 | Disposition: A | Discharge status: Home / Self Care | Payer: Medicare HMO | Source: Ambulatory Visit | Attending: Medical | Admitting: Medical

## 2021-06-15 ENCOUNTER — Ambulatory Visit (INDEPENDENT_AMBULATORY_CARE_PROVIDER_SITE_OTHER): Payer: Medicare HMO | Admitting: Medical

## 2021-06-15 ENCOUNTER — Other Ambulatory Visit: Payer: Self-pay

## 2021-06-15 VITALS — BP 120/80 | HR 91 | Wt 302.8 lb

## 2021-06-15 DIAGNOSIS — A630 Anogenital (venereal) warts: Secondary | ICD-10-CM | POA: Diagnosis not present

## 2021-06-15 DIAGNOSIS — L989 Disorder of the skin and subcutaneous tissue, unspecified: Secondary | ICD-10-CM

## 2021-06-15 DIAGNOSIS — Z79899 Other long term (current) drug therapy: Secondary | ICD-10-CM

## 2021-06-15 NOTE — Progress Notes (Signed)
Subjective:  Brandon [name] is a 50 y.o. male who presents for Chief Complaint  Patient presents with   mole removal    On shaft of penis been there a couple years     Here for skin concerns.  Has wart on left side of penis that has gotten bigger. Wants this cut off.  Has smaller patch of little bumps that could be tiny warts further out on penis shaft from bigger lesion.  He has other skin lesions of concern. No other aggravating or relieving factors.    No other c/o.  The following portions of the patient's history were reviewed and updated as appropriate: allergies, current medications, past family history, past medical history, past social history, past surgical history and problem list.  ROS Otherwise as in subjective above  Objective: BP 120/80   Pulse 91   Wt (!) 302 lb 12.8 oz (137.3 kg)   BMI 44.72 kg/m   General appearance: alert, no distress, well developed, well nourished, obese white male Left anterior shaft of penis prox 1/3 with pedunculated skin lesion with approx 2cm diameter verrrucal appearing lesion.   Just distal to this on anterior penis shaft is small patch of 4 small 58m raised bumps that could be small verrucal lesions as swell, skin color.   Right pubic region with 2 pedunculated skin tags, 3 pedunculated skin tags of the left upper thigh just lateral to the pubic region.      Assessment: Encounter Diagnoses  Name Primary?   Skin lesion Yes   Penile wart    High risk medication use      Plan: We discussed the skin lesions including skin tags, verruca lesions.  We discussed possible treatment options.  He agrees to cryotherapy of all the lesion except for the pedunculated verruca lesion.  He agrees to excision of the verruca lesion on the shaft of the penis.  We discussed risk, benefits of procedure and he consents.  I used 0.6 cc of lidocaine without epi for local anesthesia to the base of the pedunculated verruca lesion of the penis.  Cleaned  and prepped all lesions in usual sterile fashion.  Excised the pedunculated verruca lesion with scissors and sent to pathology.  Achieved hemostasis with Hyfrecator.  I use cryotherapy for 2 skin tags of the right pubic region, 3 skin tags of the left upper thigh just lateral to the pubic region, and use cryotherapy to the 4 small clumped together lesions on the shaft of the penis just distal to the verruca lesion.  Patient tolerated procedure well.  Estimated blood loss 2 cc.  We discussed wound care.  Advise immediate follow-up with the area starts to bleed again or cannot get it to stop bleeding given that he is on Plavix.  After we achieve hemostasis today we observed for another 30 minutes.  The wound did not start bleeding again.  LMakellwas seen today for mole removal.  Diagnoses and all orders for this visit:  Skin lesion  Penile wart  High risk medication use   Follow up: prn

## 2021-06-17 ENCOUNTER — Telehealth: Payer: Self-pay

## 2021-06-17 LAB — SURGICAL PATHOLOGY

## 2021-06-17 NOTE — Telephone Encounter (Signed)
Pt was called to find out how are the skin lesions doing today?   pt advised he was doing good.   Any concerns?   Pt has no concerns   Follow up appointment was made. Brandon [name]

## 2021-06-23 ENCOUNTER — Ambulatory Visit: Payer: Medicare HMO | Admitting: Medical

## 2021-06-23 VITALS — BP 130/80 | HR 91

## 2021-06-23 DIAGNOSIS — Z5189 Encounter for other specified aftercare: Secondary | ICD-10-CM

## 2021-06-23 NOTE — Progress Notes (Signed)
Subjective:  Brandon [name] is a 50 y.o. male who presents for Chief Complaint  Patient presents with   follow-up    Follow-up on spots on penis.      Here for wound check.  I saw him on 06/15/2021 for cryotherapy of several skin tags, excision of wart lesion on the penis and cryotherapy on another lesion of the penis.  He notes that most of the lesions have scabbed up.  1 scab fell off in the shower this morning.  No redness no fever no body aches or chills.  No other aggravating or relieving factors.    No other c/o.  The following portions of the patient's history were reviewed and updated as appropriate: allergies, current medications, past family history, past medical history, past social history, past surgical history and problem list.  ROS Otherwise as in subjective above  Objective: BP 130/80   Pulse 91   General appearance: alert, no distress, well developed, well nourished All of the areas that were treated with cryotherapy last visit on the left inguinal and right inguinal areas have scabs and pink-red base as expected but no induration, no warmth no fluctuance.  The area that was cauterized on the proximal dorsal penis has a scab and is healing normally as well as the area that had cryotherapy on the distal penis.  No worrisome skin findings.   Assessment: Encounter Diagnosis  Name Primary?   Encounter for wound re-check Yes     Plan: I reviewed the skin lesions that we treated last visit.  All seem to be healing as expected.  We discussed wound care.  We discussed that the scabs will fall off and it may take 2 to 3 weeks before the skin returns to normal.  He can use vitamin E cream in the meantime.  Call or recheck if fever, redness, pain or other worsening symptoms  Brandon [name] was seen today for follow-up.  Diagnoses and all orders for this visit:  Encounter for wound re-check   Follow up: prn

## 2021-08-02 ENCOUNTER — Other Ambulatory Visit: Payer: Self-pay | Admitting: *Deleted

## 2021-08-02 DIAGNOSIS — E785 Hyperlipidemia, unspecified: Secondary | ICD-10-CM

## 2021-09-02 ENCOUNTER — Ambulatory Visit (INDEPENDENT_AMBULATORY_CARE_PROVIDER_SITE_OTHER): Payer: Medicare HMO

## 2021-09-02 VITALS — Ht 69.0 in | Wt 300.0 lb

## 2021-09-02 DIAGNOSIS — Z122 Encounter for screening for malignant neoplasm of respiratory organs: Secondary | ICD-10-CM | POA: Diagnosis not present

## 2021-09-02 DIAGNOSIS — Z0000 Encounter for general adult medical examination without abnormal findings: Secondary | ICD-10-CM

## 2021-09-02 DIAGNOSIS — F17200 Nicotine dependence, unspecified, uncomplicated: Secondary | ICD-10-CM | POA: Diagnosis not present

## 2021-09-02 NOTE — Patient Instructions (Signed)
Brandon [name] , Thank you for taking time to come for your Medicare Wellness Visit. I appreciate your ongoing commitment to your health goals. Please review the following plan we discussed and let me know if I can assist you in the future.   Screening recommendations/referrals: Colonoscopy: completed 09/21/2020, due 09/21/2021 Recommended yearly ophthalmology/optometry visit for glaucoma screening and checkup Recommended yearly dental visit for hygiene and checkup  Vaccinations: Influenza vaccine: due Pneumococcal vaccine: completed 06/08/2021 Tdap vaccine: completed 04/29/2020, due 04/29/2030 Shingles vaccine: discussed   Covid-19:  10/28/2020, 02/07/2020, 01/15/2020  Advanced directives: Advance directive discussed with you today.   Conditions/risks identified: smoking  Next appointment: Follow up in one year for your annual wellness visit   Preventive Care 40-64 Years, Male Preventive care refers to lifestyle choices and visits with your health care provider that can promote health and wellness. What does preventive care include? A yearly physical exam. This is also called an annual well check. Dental exams once or twice a year. Routine eye exams. Ask your health care provider how often you should have your eyes checked. Personal lifestyle choices, including: Daily care of your teeth and gums. Regular physical activity. Eating a healthy diet. Avoiding tobacco and drug use. Limiting alcohol use. Practicing safe sex. Taking low-dose aspirin every day starting at age 14. What happens during an annual well check? The services and screenings done by your health care provider during your annual well check will depend on your age, overall health, lifestyle risk factors, and family history of disease. Counseling  Your health care provider may ask you questions about your: Alcohol use. Tobacco use. Drug use. Emotional well-being. Home and relationship well-being. Sexual activity. Eating  habits. Work and work Statistician. Screening  You may have the following tests or measurements: Height, weight, and BMI. Blood pressure. Lipid and cholesterol levels. These may be checked every 5 years, or more frequently if you are over 88 years old. Skin check. Lung cancer screening. You may have this screening every year starting at age 56 if you have a 30-pack-year history of smoking and currently smoke or have quit within the past 15 years. Fecal occult blood test (FOBT) of the stool. You may have this test every year starting at age 45. Flexible sigmoidoscopy or colonoscopy. You may have a sigmoidoscopy every 5 years or a colonoscopy every 10 years starting at age 89. Prostate cancer screening. Recommendations will vary depending on your family history and other risks. Hepatitis C blood test. Hepatitis B blood test. Sexually transmitted disease (STD) testing. Diabetes screening. This is done by checking your blood sugar (glucose) after you have not eaten for a while (fasting). You may have this done every 1-3 years. Discuss your test results, treatment options, and if necessary, the need for more tests with your health care provider. Vaccines  Your health care provider may recommend certain vaccines, such as: Influenza vaccine. This is recommended every year. Tetanus, diphtheria, and acellular pertussis (Tdap, Td) vaccine. You may need a Td booster every 10 years. Zoster vaccine. You may need this after age 36. Pneumococcal 13-valent conjugate (PCV13) vaccine. You may need this if you have certain conditions and have not been vaccinated. Pneumococcal polysaccharide (PPSV23) vaccine. You may need one or two doses if you smoke cigarettes or if you have certain conditions. Talk to your health care provider about which screenings and vaccines you need and how often you need them. This information is not intended to replace advice given to you by your health  care provider. Make sure you  discuss any questions you have with your health care provider. Document Released: 11/05/2015 Document Revised: 06/28/2016 Document Reviewed: 08/10/2015 Elsevier Interactive Patient Education  2017 [place] Prevention in the Home Falls can cause injuries. They can happen to people of all ages. There are many things you can do to make your home safe and to help prevent falls. What can I do on the outside of my home? Regularly fix the edges of walkways and driveways and fix any cracks. Remove anything that might make you trip as you walk through a door, such as a raised step or threshold. Trim any bushes or trees on the path to your home. Use bright outdoor lighting. Clear any walking paths of anything that might make someone trip, such as rocks or tools. Regularly check to see if handrails are loose or broken. Make sure that both sides of any steps have handrails. Any raised decks and porches should have guardrails on the edges. Have any leaves, snow, or ice cleared regularly. Use sand or salt on walking paths during winter. Clean up any spills in your garage right away. This includes oil or grease spills. What can I do in the bathroom? Use night lights. Install grab bars by the toilet and in the tub and shower. Do not use towel bars as grab bars. Use non-skid mats or decals in the tub or shower. If you need to sit down in the shower, use a plastic, non-slip stool. Keep the floor dry. Clean up any water that spills on the floor as soon as it happens. Remove soap buildup in the tub or shower regularly. Attach bath mats securely with double-sided non-slip rug tape. Do not have throw rugs and other things on the floor that can make you trip. What can I do in the bedroom? Use night lights. Make sure that you have a light by your bed that is easy to reach. Do not use any sheets or blankets that are too big for your bed. They should not hang down onto the floor. Have a firm chair  that has side arms. You can use this for support while you get dressed. Do not have throw rugs and other things on the floor that can make you trip. What can I do in the kitchen? Clean up any spills right away. Avoid walking on wet floors. Keep items that you use a lot in easy-to-reach places. If you need to reach something above you, use a strong step stool that has a grab bar. Keep electrical cords out of the way. Do not use floor polish or wax that makes floors slippery. If you must use wax, use non-skid floor wax. Do not have throw rugs and other things on the floor that can make you trip. What can I do with my stairs? Do not leave any items on the stairs. Make sure that there are handrails on both sides of the stairs and use them. Fix handrails that are broken or loose. Make sure that handrails are as long as the stairways. Check any carpeting to make sure that it is firmly attached to the stairs. Fix any carpet that is loose or worn. Avoid having throw rugs at the top or bottom of the stairs. If you do have throw rugs, attach them to the floor with carpet tape. Make sure that you have a light switch at the top of the stairs and the bottom of the stairs. If you do not  have them, ask someone to add them for you. What else can I do to help prevent falls? Wear shoes that: Do not have high heels. Have rubber bottoms. Are comfortable and fit you well. Are closed at the toe. Do not wear sandals. If you use a stepladder: Make sure that it is fully opened. Do not climb a closed stepladder. Make sure that both sides of the stepladder are locked into place. Ask someone to hold it for you, if possible. Clearly mark and make sure that you can see: Any grab bars or handrails. First and last steps. Where the edge of each step is. Use tools that help you move around (mobility aids) if they are needed. These include: Canes. Walkers. Scooters. Crutches. Turn on the lights when you go into a  dark area. Replace any light bulbs as soon as they burn out. Set up your furniture so you have a clear path. Avoid moving your furniture around. If any of your floors are uneven, fix them. If there are any pets around you, be aware of where they are. Review your medicines with your doctor. Some medicines can make you feel dizzy. This can increase your chance of falling. Ask your doctor what other things that you can do to help prevent falls. This information is not intended to replace advice given to you by your health care provider. Make sure you discuss any questions you have with your health care provider. Document Released: 08/05/2009 Document Revised: 03/16/2016 Document Reviewed: 11/13/2014 Elsevier Interactive Patient Education  2017 [employer].

## 2021-09-02 NOTE — Progress Notes (Signed)
I connected with  Brandon [name] today via telehealth video enabled device and verified that I am speaking with the correct person using two identifiers.   Location: Patient: home Provider: work  Persons participating in virtual visit: Brandon [name], Brandon Durand LPN  I discussed the limitations, risks, security and privacy concerns of performing an evaluation and management service by video and the availability of in person appointments. The patient expressed understanding and agreed to proceed.   Some vital signs may be absent or patient reported.     Subjective:   Brandon [name] is a 50 y.o. male who presents for Medicare Annual/Subsequent preventive examination.  Review of Systems     Cardiac Risk Factors include: dyslipidemia;hypertension;male gender;obesity (BMI >30kg/m2);sedentary lifestyle;smoking/ tobacco exposure     Objective:    Today's Vitals   09/02/21 1543  Weight: 300 lb (136.1 kg)  Height: 5\' 9"  (1.753 m)   Body mass index is 44.3 kg/m.  Advanced Directives 09/02/2021 09/21/2020 02/09/2020 02/04/2020 12/23/2019 12/23/2019 12/23/2019  Does Patient Have a Medical Advance Directive? No No No No No No No  Would patient like information on creating a medical advance directive? - - - No - Patient declined No - Patient declined No - Patient declined No - Patient declined    Current Medications (verified) Outpatient Encounter Medications as of 09/02/2021  Medication Sig   acetaminophen (TYLENOL) 500 MG tablet Take 1,000 mg by mouth every 6 (six) hours as needed for mild pain or moderate pain.   albuterol (VENTOLIN HFA) 108 (90 Base) MCG/ACT inhaler Inhale 2 puffs into the lungs every 6 (six) hours as needed.   aspirin EC 81 MG tablet Take 1 tablet (81 mg total) by mouth daily. Swallow whole.   atorvastatin (LIPITOR) 80 MG tablet Take 1 tablet (80 mg total) by mouth daily.   clopidogrel (PLAVIX) 75 MG tablet Take 1 tablet (75 mg total) by mouth daily.   Evolocumab  (REPATHA SURECLICK) 694 MG/ML SOAJ Inject 1 Dose into the skin every 14 (fourteen) days.   ezetimibe (ZETIA) 10 MG tablet Take 1 tablet (10 mg total) by mouth daily.   No facility-administered encounter medications on file as of 09/02/2021.    Allergies (verified) Patient has no known allergies.   History: Past Medical History:  Diagnosis Date   Arteriosclerotic cardiovascular disease (ASCVD) 2006, 2012   2006-acute IMI treated with urgent RCA stent; 2007-Cutting Balloon for in-stent restenosis; 02/2010-presented with ACS and minimal troponin elevation:70% LAD, 80% distal circumflex, 80% proximal ramus branch vessel,in-stent restenosis of 70% in the RCA; BMS for proximal critical RCA stenosis, restenosis Nov 2012   Carotid artery occlusion    Heart attack ([place])    History of noncompliance with medical treatment    Due to financial considerations   Hyperlipidemia    Hypertension    Stroke [facility])    Tobacco abuse    40 pack years   Past Surgical History:  Procedure Laterality Date   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   Procedure: BYPASS GRAFT CAROTID-SUBCLAVIAN USING HEMASHIELD GOLD GRAFT;  Surgeon: Elam Dutch, MD;  Location: [facility];  Service: Vascular;  Laterality: Left;   COLONOSCOPY WITH PROPOFOL N/A 09/21/2020   Procedure: COLONOSCOPY WITH PROPOFOL;  Surgeon: Lavena Bullion, DO;  [facility];  Service: Gastroenterology;  Laterality: N/A;   CORONARY ANGIOPLASTY WITH STENT PLACEMENT     HEMOSTASIS CLIP PLACEMENT  09/21/2020   Procedure: HEMOSTASIS CLIP PLACEMENT;  Surgeon: Lavena Bullion, DO;  [facility];  Service: Gastroenterology;;   IR ANGIO EXTERNAL CAROTID SEL EXT CAROTID BILAT MOD SED  12/15/2019   IR ANGIO VERTEBRAL SEL VERTEBRAL UNI R MOD SED  12/15/2019   IR ANGIOGRAM EXTREMITY LEFT  12/15/2019   IR ANGIOGRAM EXTREMITY LEFT  12/15/2019   IR CT HEAD LTD  12/15/2019   IR PERCUTANEOUS ART THROMBECTOMY/INFUSION INTRACRANIAL INC DIAG  ANGIO  12/15/2019   POLYPECTOMY  09/21/2020   Procedure: POLYPECTOMY;  Surgeon: Lavena Bullion, DO;  [facility];  Service: Gastroenterology;;   RADIOLOGY WITH ANESTHESIA N/A 12/15/2019   Procedure: IR WITH ANESTHESIA;  Surgeon: Luanne Bras, MD;  Location: [facility];  Service: Radiology;  Laterality: N/A;   Family History  Problem Relation Age of Onset   Depression Mother 71       Suicide   Heart disease Father 75       Deceased from massive heart-attack   Hyperlipidemia Father    Hypertension Father    COPD Maternal Grandfather    Cancer Paternal Grandmother        Male Cancer   Heart disease Paternal Grandmother    Colon cancer Neg Hx    Pancreatic cancer Neg Hx    Esophageal cancer Neg Hx    Social History   Socioeconomic History   Marital status: Widowed    Spouse name: Not on file   Number of children: 2   Years of education: Not on file   Highest education level: Not on file  Occupational History   Occupation: Disabled    Comment: Previously employed as an Programmer, systems  Tobacco Use   Smoking status: Every Day    Packs/day: 1.00    Years: 34.00    Pack years: 34.00    Types: Cigarettes   Smokeless tobacco: Never  Vaping Use   Vaping Use: Never used  Substance and Sexual Activity   Alcohol use: No    Comment:  Alcoholism- quit 2002   Drug use: No   Sexual activity: Not Currently  Other Topics Concern   Not on file  Social History Narrative            Social Determinants of Health   Financial Resource Strain: Low Risk    Difficulty of Paying Living Expenses: Not hard at all  Food Insecurity: No Food Insecurity   Worried About Charity fundraiser in the Last Year: Never true   [place] in the Last Year: Never true  Transportation Needs: No Transportation Needs   Lack of Transportation (Medical): No   Lack of Transportation (Non-Medical): No  Physical Activity: Inactive   Days of Exercise per Week: 0 days   Minutes  of Exercise per Session: 0 min  Stress: No Stress Concern Present   Feeling of Stress : Not at all  Social Connections: Not on file    Tobacco Counseling Ready to quit: Not Answered Counseling given: Not Answered   Clinical Intake:  Pre-visit preparation completed: Yes  Pain : No/denies pain     Nutritional Status: BMI > 30  Obese Nutritional Risks: None Diabetes: No  How often do you need to have someone help you when you read instructions, pamphlets, or other written materials from your doctor or pharmacy?: 1 - Never What is the last grade level you completed in school?: 12th grade  Diabetic? no  Interpreter Needed?: No  Information entered by :: NAllen LPN   Activities of Daily Living In your present state of health,  do you have any difficulty performing the following activities: 09/02/2021  Hearing? Y  Comment decreased hearing in left ear  Vision? N  Difficulty concentrating or making decisions? N  Walking or climbing stairs? N  Dressing or bathing? N  Doing errands, shopping? N  Preparing Food and eating ? N  Using the Toilet? N  In the past six months, have you accidently leaked urine? Y  Do you have problems with loss of bowel control? N  Managing your Medications? N  Managing your Finances? N  Housekeeping or managing your Housekeeping? N  Some recent data might be hidden    Patient Care Team: Denita Lung, MD as PCP - General (Family Medicine) Lorretta Harp, MD as PCP - Cardiology (Cardiology)  Indicate any recent Medical Services you may have received from other than [facility] providers in the past year (date may be approximate).     Assessment:   This is a routine wellness examination for Brandon [name].  Hearing/Vision screen Vision Screening - Comments:: No regular eye exams,   Dietary issues and exercise activities discussed: Current Exercise Habits: The patient does not participate in regular exercise at present   Goals Addressed              This Visit's Progress    Patient Stated       09/02/2021, wants to get under 200 pounds and quit smoking       Depression Screen PHQ 2/9 Scores 09/02/2021 06/08/2021 04/29/2020 06/19/2017  PHQ - 2 Score 0 0 0 0    Fall Risk Fall Risk  09/02/2021 06/19/2017  Falls in the past year? 0 No  Risk for fall due to : Medication side effect -  Follow up Falls evaluation completed;Education provided;Falls prevention discussed -    FALL RISK PREVENTION PERTAINING TO THE HOME:  Any stairs in or around the home? No  If so, are there any without handrails?  N/a Home free of loose throw rugs in walkways, pet beds, electrical cords, etc? Yes  Adequate lighting in your home to reduce risk of falls? Yes   ASSISTIVE DEVICES UTILIZED TO PREVENT FALLS:  Life alert? No  Use of a cane, walker or w/c? No  Grab bars in the bathroom? Yes  Shower chair or bench in shower? Yes  Elevated toilet seat or a handicapped toilet? Yes   TIMED UP AND GO:  Was the test performed? No .      Cognitive Function:     6CIT Screen 09/02/2021  What Year? 0 points  What month? 0 points  What time? 0 points  Count back from 20 0 points  Months in reverse 0 points  Repeat phrase 2 points  Total Score 2    Immunizations Immunization History  Administered Date(s) Administered   Influenza Whole 06/27/2011   Influenza,inj,Quad PF,6+ Mos 06/19/2017   PFIZER(Purple Top)SARS-COV-2 Vaccination 01/15/2020, 02/07/2020   PNEUMOCOCCAL CONJUGATE-20 06/08/2021   Tdap 04/29/2020    TDAP status: Up to date  Flu Vaccine status: Due, Education has been provided regarding the importance of this vaccine. Advised may receive this vaccine at local pharmacy or Health Dept. Aware to provide a copy of the vaccination record if obtained from local pharmacy or Health Dept. Verbalized acceptance and understanding.  Pneumococcal vaccine status: Up to date  Covid-19 vaccine status: Completed vaccines  Qualifies for  Shingles Vaccine? Yes   Zostavax completed No   Shingrix Completed?: No.    Education has been provided regarding the importance  of this vaccine. Patient has been advised to call insurance company to determine out of pocket expense if they have not yet received this vaccine. Advised may also receive vaccine at local pharmacy or Health Dept. Verbalized acceptance and understanding.  Screening Tests Health Maintenance  Topic Date Due   Zoster Vaccines- Shingrix (1 of 2) Never done   COVID-19 Vaccine (3 - Pfizer risk series) 03/06/2020   INFLUENZA VACCINE  05/23/2021   TETANUS/TDAP  04/29/2030   COLONOSCOPY (Pts 45-100yrs Insurance coverage will need to be confirmed)  09/21/2030   Pneumococcal Vaccine 39-19 Years old  Completed   Hepatitis C Screening  Completed   HIV Screening  Completed   HPV VACCINES  Aged Out    Health Maintenance  Health Maintenance Due  Topic Date Due   Zoster Vaccines- Shingrix (1 of 2) Never done   COVID-19 Vaccine (3 - Pfizer risk series) 03/06/2020   INFLUENZA VACCINE  05/23/2021    Colorectal cancer screening: Type of screening: Colonoscopy. Completed 09/21/2020. Repeat every 1 years  Lung Cancer Screening: (Low Dose CT Chest recommended if Age 18-80 years, 30 pack-year currently smoking OR have quit w/in 15years.) does qualify.   Lung Cancer Screening Referral: ordered  Additional Screening:  Hepatitis C Screening: does qualify; Completed 06/08/2021  Vision Screening: Recommended annual ophthalmology exams for early detection of glaucoma and other disorders of the eye. Is the patient up to date with their annual eye exam?  No  Who is the provider or what is the name of the office in which the patient attends annual eye exams? none If pt is not established with a provider, would they like to be referred to a provider to establish care? No .   Dental Screening: Recommended annual dental exams for proper oral hygiene  Community Resource Referral /  Chronic Care Management: CRR required this visit?  No   CCM required this visit?  No      Plan:     I have personally reviewed and noted the following in the patient's chart:   Medical and social history Use of alcohol, tobacco or illicit drugs  Current medications and supplements including opioid prescriptions. Patient is not currently taking opioid prescriptions. Functional ability and status Nutritional status Physical activity Advanced directives List of other physicians Hospitalizations, surgeries, and ER visits in previous 12 months Vitals Screenings to include cognitive, depression, and falls Referrals and appointments  In addition, I have reviewed and discussed with patient certain preventive protocols, quality metrics, and best practice recommendations. A written personalized care plan for preventive services as well as general preventive health recommendations were provided to patient.     Kellie Simmering, LPN   16/07/9603   Nurse Notes: none

## 2021-09-14 ENCOUNTER — Ambulatory Visit: Payer: Medicare HMO | Admitting: Internal Medicine

## 2021-10-25 ENCOUNTER — Telehealth: Payer: Self-pay | Admitting: Internal Medicine

## 2021-10-25 NOTE — Telephone Encounter (Signed)
Attempted PA for repatha sureclick via [place] already on file for this request.  Authorization starting on 10/23/2021 and ending on 10/22/2022

## 2022-01-17 NOTE — Progress Notes (Signed)
? ?Cardiology Clinic Note  ? ?Patient Name: Brandon [name] ?Date of Encounter: 01/23/2022 ? ?Primary Care Provider:  Denita Lung, MD ?Primary Cardiologist:  [name], MD ? ?Patient Profile  ?  ?Brandon [name] 51 year old male presents to the clinic today for follow-up evaluation of his coronary artery disease and shortness of breath. ? ?Past Medical History  ?  ?Past Medical History:  ?Diagnosis Date  ? Arteriosclerotic cardiovascular disease (ASCVD) 2006, 2012  ? 2006-acute IMI treated with urgent RCA stent; 2007-Cutting Balloon for in-stent restenosis; 02/2010-presented with ACS and minimal troponin elevation:70% LAD, 80% distal circumflex, 80% proximal ramus branch vessel,in-stent restenosis of 70% in the RCA; BMS for proximal critical RCA stenosis, restenosis Nov 2012  ? Carotid artery occlusion   ? Heart attack ([place])   ? History of noncompliance with medical treatment   ? Due to financial considerations  ? Hyperlipidemia   ? Hypertension   ? Stroke [facility])   ? Tobacco abuse   ? 40 pack years  ? ?Past Surgical History:  ?Procedure Laterality Date  ? 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  ? Procedure: BYPASS GRAFT CAROTID-SUBCLAVIAN USING HEMASHIELD GOLD GRAFT;  Surgeon: Elam Dutch, MD;  Location: [facility] OR;  Service: Vascular;  Laterality: Left;  ? COLONOSCOPY WITH PROPOFOL N/A 09/21/2020  ? Procedure: COLONOSCOPY WITH PROPOFOL;  Surgeon: Lavena Bullion, DO;  [facility];  Service: Gastroenterology;  Laterality: N/A;  ? CORONARY ANGIOPLASTY WITH STENT PLACEMENT    ? HEMOSTASIS CLIP PLACEMENT  09/21/2020  ? Procedure: HEMOSTASIS CLIP PLACEMENT;  Surgeon: Lavena Bullion, DO;  [facility];  Service: Gastroenterology;;  ? IR ANGIO EXTERNAL CAROTID SEL EXT CAROTID BILAT MOD SED  12/15/2019  ? IR ANGIO VERTEBRAL SEL VERTEBRAL UNI R MOD SED  12/15/2019  ? IR ANGIOGRAM EXTREMITY LEFT  12/15/2019  ? IR ANGIOGRAM EXTREMITY LEFT  12/15/2019  ? IR CT HEAD LTD  12/15/2019  ? IR  PERCUTANEOUS ART THROMBECTOMY/INFUSION INTRACRANIAL INC DIAG ANGIO  12/15/2019  ? POLYPECTOMY  09/21/2020  ? Procedure: POLYPECTOMY;  Surgeon: Lavena Bullion, DO;  [facility];  Service: Gastroenterology;;  ? RADIOLOGY WITH ANESTHESIA N/A 12/15/2019  ? Procedure: IR WITH ANESTHESIA;  Surgeon: Luanne Bras, MD;  Location: [facility];  Service: Radiology;  Laterality: N/A;  ? ? ?Allergies ? ?No Known Allergies ? ?History of Present Illness  ?  ?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  He also had ACS in 2011 catheterization showed significant progression of CAD but no significant obstruction.  He also has a history of TIA, hyperlipidemia, family history of heart disease (father died of MI in his 62s) recent LDL 106.  He was referred to lipid clinic and PCSK9 inhibitor was recommended. ?  ?He presented to the emergency department on 02/19/2020 with increased work of breathing 2 weeks after his left subclavian bypass by Dr. Oneida Alar.  He stated he had contacted Dr. Oneida Alar office with concerns of shortness of breath and wants directed to present to the emergency department.  His chest x-ray showed stable cardiomegaly and stable left lower lobe atelectasis.  CT showed left lower lobe consolidation consistent with acute infiltrates.  He was diagnosed with community-acquired pneumonia and started on doxycycline for 7 days. ?  ?He presented the clinic 02/25/20 for follow-up evaluation and stated he noticed increased work of breathing after his surgery with Dr.  Fields.  He stated he presented to follow-up with Dr. Oneida Alar and was given 3 days worth of Lasix.  He felt that he had good urine output with the medication however, it did not help with his breathing.  He then presented to the emergency department on Dr. Nona Dell recommendation.  He was given doxycycline which she continued to take.  He still  notices dyspnea with exertion.  He stated he did not feel swollen and had lost weight.  Given his recent cardiac evaluation I did not feel his symptoms were related to cardiac causes and  refered to pulmonology.   follow-up with Dr. Gwenlyn Found was planned for 6 months. ? ?He was seen by Dr. Gwenlyn Found on 05/31/2021.  During that time he denied chest pain or shortness of breath.  He continues to smoke a pack per day however.  He underwent successful left common carotid and left subclavian artery stenting by interventional radiology.  He was started on Repatha along with his atorvastatin and ezetimibe.  He was diagnosed with OSA but was intolerant of CPAP.  His weight remains stable. ? ?He presents to the clinic today for follow-up evaluation states he feels well.  He reports that his wife passed away suddenly in Jul 10, 2023.  He continues to smoke and reports that he is unable to break the habit.  He notices that when he is smoking his weight decreases and then when he tries to stop smoking his weight goes up.  His weight today is up 0.80 pound.  He has not been checking his blood pressure at home.  We reviewed his prior chest CT.  He expressed understanding.  Initially his blood pressure is 156/102 and on recheck is 124/88.  We talked about the importance of low-salt diet and weight loss.  He reports that he is walking daily with his dog for about 30 minutes.  I reviewed his medications.  I we will order fasting lipids and LFTs, BMP, chest CT, have him maintain a blood pressure log, and plan follow-up for 1 year. ?  ?Today he denies chest pain, increased shortness of breath, lower extremity edema, fatigue, palpitations, melena, hematuria, hemoptysis, diaphoresis, weakness, presyncope, syncope, orthopnea, and PND. ? ?Home Medications  ?  ?Prior to Admission medications   ?Medication Sig Start Date End Date Taking? Authorizing Provider  ?acetaminophen (TYLENOL) 500 MG tablet Take 1,000 mg by mouth every 6 (six) hours as needed for  mild pain or moderate pain.    [provider]  ?albuterol (VENTOLIN HFA) 108 (90 Base) MCG/ACT inhaler Inhale 2 puffs into the lungs every 6 (six) hours as needed. 03/02/20   Sherrilyn Rist A, MD  ?aspirin EC 81 MG tablet Take 1 tablet (81 mg total) by mouth daily. Swallow whole. 05/31/21   Lorretta Harp, MD  ?atorvastatin (LIPITOR) 80 MG tablet Take 1 tablet (80 mg total) by mouth daily. 05/31/21   Lorretta Harp, MD  ?clopidogrel (PLAVIX) 75 MG tablet Take 1 tablet (75 mg total) by mouth daily. 05/31/21   Lorretta Harp, MD  ?Evolocumab (REPATHA SURECLICK) 470 MG/ML SOAJ Inject 1 Dose into the skin every 14 (fourteen) days. 05/31/21   Lorretta Harp, MD  ?ezetimibe (ZETIA) 10 MG tablet Take 1 tablet (10 mg total) by mouth daily. 05/31/21   Lorretta Harp, MD  ? ? ?Family History  ?  ?Family History  ?Problem Relation Age of Onset  ? Depression Mother 53  ?     Suicide  ? Heart  disease Father 60  ?     Deceased from massive heart-attack  ? Hyperlipidemia Father   ? Hypertension Father   ? COPD Maternal Grandfather   ? Cancer Paternal Grandmother   ?     Male Cancer  ? Heart disease Paternal Grandmother   ? Colon cancer Neg Hx   ? Pancreatic cancer Neg Hx   ? Esophageal cancer Neg Hx   ? ?He indicated that his mother is deceased. He indicated that his father is deceased. He indicated that the status of his maternal grandfather is unknown. He indicated that the status of his paternal grandmother is unknown. He indicated that the status of his neg hx is unknown. ? ?Social History  ?  ?Social History  ? ?Socioeconomic History  ? Marital status: Widowed  ?  Spouse name: Not on file  ? Number of children: 2  ? Years of education: Not on file  ? Highest education level: Not on file  ?Occupational History  ? Occupation: Disabled  ?  Comment: Previously employed as an Programmer, systems  ?Tobacco Use  ? Smoking status: Every Day  ?  Packs/day: 1.00  ?  Years: 34.00  ?  Pack years: 34.00  ?  Types:  Cigarettes  ? Smokeless tobacco: Never  ?Vaping Use  ? Vaping Use: Never used  ?Substance and Sexual Activity  ? Alcohol use: No  ?  Comment:  Alcoholism- quit 2002  ? Drug use: No  ? Sexual activity: Not Cur

## 2022-01-23 ENCOUNTER — Encounter: Payer: Self-pay | Admitting: General Practice

## 2022-01-23 ENCOUNTER — Ambulatory Visit (INDEPENDENT_AMBULATORY_CARE_PROVIDER_SITE_OTHER): Payer: Medicare HMO | Admitting: General Practice

## 2022-01-23 VITALS — BP 124/88 | HR 96 | Ht 69.0 in | Wt 302.4 lb

## 2022-01-23 DIAGNOSIS — I712 Thoracic aortic aneurysm, without rupture, unspecified: Secondary | ICD-10-CM | POA: Diagnosis not present

## 2022-01-23 DIAGNOSIS — E782 Mixed hyperlipidemia: Secondary | ICD-10-CM | POA: Diagnosis not present

## 2022-01-23 DIAGNOSIS — I6522 Occlusion and stenosis of left carotid artery: Secondary | ICD-10-CM | POA: Diagnosis not present

## 2022-01-23 DIAGNOSIS — R0602 Shortness of breath: Secondary | ICD-10-CM

## 2022-01-23 DIAGNOSIS — I2510 Atherosclerotic heart disease of native coronary artery without angina pectoris: Secondary | ICD-10-CM

## 2022-01-23 NOTE — Patient Instructions (Signed)
Medication Instructions:  ?The current medical regimen is effective;  continue present plan and medications as directed. Please refer to the Current Medication list given to you today.  ? ?*If you need a refill on your cardiac medications before your next appointment, please call your pharmacy* ? ?Lab Work:    ?FASTING LIPID, LFT AND BMET     ? ?Testing/Procedures:  ?Your physician has requested that you have an a CHEST CT ? ?Special Instructions ?PLEASE READ AND FOLLOW SALTY 6-ATTACHED-1,'800mg'$  daily ? ?PLEASE INCREASE PHYSICAL ACTIVITY AS TOLERATED  ? ?CONTINUE WEIGHT LOSS ? ?PLEASE READ AND FOLLOW SMOKING CESSATION TIPS-ATTACHED ? ?TAKE AND LOG YOU BLOOD PRESSURE TWICE MONTHLY ? ?Follow-Up: ?Your next appointment:  12 month(s) In Person with [name], MD  ? ?Please call our office 2 months in advance to schedule this appointment  :1 ? ?At [facility], you and your health needs are our priority.  As part of our continuing mission to provide you with exceptional heart care, we have created designated Provider Care Teams.  These Care Teams include your primary Cardiologist (physician) and Advanced Practice Providers (APPs -  Physician Assistants and Nurse Practitioners) who all work together to provide you with the care you need, when you need it. ? ? ? ? ?        6 SALTY THINGS TO AVOID     1,'800MG'$  DAILY ? ? ? ? ? ?Steps to Quit Smoking ?Smoking tobacco is the leading cause of preventable death. It can affect almost every organ in the body. Smoking puts you and people around you at risk for many serious, long-lasting (chronic) diseases. Quitting smoking can be hard, but it is one of the best things that you can do for your health. It is never too late to quit. ?How do I get ready to quit? ?When you decide to quit smoking, make a plan to help you succeed. Before you quit: ?Pick a date to quit. Set a date within the next 2 weeks to give you time to prepare. ?Write down the reasons why you are quitting. Keep  this list in places where you will see it often. ?Tell your family, friends, and co-workers that you are quitting. Their support is important. ?Talk with your doctor about the choices that may help you quit. ?Find out if your health insurance will pay for these treatments. ?Know the people, places, things, and activities that make you want to smoke (triggers). Avoid them. ?What first steps can I take to quit smoking? ?Throw away all cigarettes at home, at work, and in your car. ?Throw away the things that you use when you smoke, such as ashtrays and lighters. ?Clean your car. Make sure to empty the ashtray. ?Clean your home, including curtains and carpets. ?What can I do to help me quit smoking? ?Talk with your doctor about taking medicines and seeing a counselor at the same time. You are more likely to succeed when you do both. ?If you are pregnant or breastfeeding, talk with your doctor about counseling or other ways to quit smoking. Do not take medicine to help you quit smoking unless your doctor tells you to do so. ?To quit smoking: ?Quit right away ?Quit smoking totally, instead of slowly cutting back on how much you smoke over a period of time. ?Go to counseling. You are more likely to quit if you go to counseling sessions regularly. ?Take medicine ?You may take medicines to help you quit. Some medicines need a prescription, and some  you can buy over-the-counter. Some medicines may contain a drug called nicotine to replace the nicotine in cigarettes. Medicines may: ?Help you to stop having the desire to smoke (cravings). ?Help to stop the problems that come when you stop smoking (withdrawal symptoms). ?Your doctor may ask you to use: ?Nicotine patches, gum, or lozenges. ?Nicotine inhalers or sprays. ?Non-nicotine medicine that is taken by mouth. ?Find resources ?Find resources and other ways to help you quit smoking and remain smoke-free after you quit. These resources are most helpful when you use them often.  They include: ?Online chats with a Social worker. ?Phone quitlines. ?Careers information officer. ?Support groups or group counseling. ?Text messaging programs. ?Mobile phone apps. Use apps on your mobile phone or tablet that can help you stick to your quit plan. There are many free apps for mobile phones and tablets as well as websites. Examples include Quit Guide from the [employer] and smokefree.gov ? ?What things can I do to make it easier to quit? ? ?Talk to your family and friends. Ask them to support and encourage you. ?Call a phone quitline (1-800-QUIT-NOW), reach out to support groups, or work with a Social worker. ?Ask people who smoke to not smoke around you. ?Avoid places that make you want to smoke, such as: ?Bars. ?Parties. ?Smoke-break areas at work. ?Spend time with people who do not smoke. ?Lower the stress in your life. Stress can make you want to smoke. Try these things to help your stress: ?Getting regular exercise. ?Doing deep-breathing exercises. ?Doing yoga. ?Meditating. ?Doing a body scan. To do this, close your eyes, focus on one area of your body at a time from head to toe. Notice which parts of your body are tense. Try to relax the muscles in those areas. ?How will I feel when I quit smoking? ?Day 1 to 3 weeks ?Within the first 24 hours, you may start to have some problems that come from quitting tobacco. These problems are very bad 2-3 days after you quit, but they do not often last for more than 2-3 weeks. You may get these symptoms: ?Mood swings. ?Feeling restless, nervous, angry, or annoyed. ?Trouble concentrating. ?Dizziness. ?Strong desire for high-sugar foods and nicotine. ?Weight gain. ?Trouble pooping (constipation). ?Feeling like you may vomit (nausea). ?Coughing or a sore throat. ?Changes in how the medicines that you take for other issues work in your body. ?Depression. ?Trouble sleeping (insomnia). ?Week 3 and afterward ?After the first 2-3 weeks of quitting, you may start to notice more  positive results, such as: ?Better sense of smell and taste. ?Less coughing and sore throat. ?Slower heart rate. ?Lower blood pressure. ?Clearer skin. ?Better breathing. ?Fewer sick days. ?Quitting smoking can be hard. Do not give up if you fail the first time. Some people need to try a few times before they succeed. Do your best to stick to your quit plan, and talk with your doctor if you have any questions or concerns. ?Summary ?Smoking tobacco is the leading cause of preventable death. Quitting smoking can be hard, but it is one of the best things that you can do for your health. ?When you decide to quit smoking, make a plan to help you succeed. ?Quit smoking right away, not slowly over a period of time. ?When you start quitting, seek help from your doctor, family, or friends. ?This information is not intended to replace advice given to you by your health care provider. Make sure you discuss any questions you have with your health care provider. ?Document Revised:  06/17/2021 Document Reviewed: 12/28/2018 ?Elsevier Patient Education ? [employer]. ? ?

## 2022-01-26 NOTE — Telephone Encounter (Signed)
Auth # [id number] until 10/22/2022 by [place] @ 01/26/2022 10:20 AM. ?

## 2022-02-08 DIAGNOSIS — R0602 Shortness of breath: Secondary | ICD-10-CM | POA: Diagnosis not present

## 2022-02-08 DIAGNOSIS — I712 Thoracic aortic aneurysm, without rupture, unspecified: Secondary | ICD-10-CM | POA: Diagnosis not present

## 2022-02-08 DIAGNOSIS — E782 Mixed hyperlipidemia: Secondary | ICD-10-CM | POA: Diagnosis not present

## 2022-02-08 DIAGNOSIS — I6522 Occlusion and stenosis of left carotid artery: Secondary | ICD-10-CM | POA: Diagnosis not present

## 2022-02-08 DIAGNOSIS — I2510 Atherosclerotic heart disease of native coronary artery without angina pectoris: Secondary | ICD-10-CM | POA: Diagnosis not present

## 2022-02-09 LAB — HEPATIC FUNCTION PANEL
ALT: 27 IU/L (ref 0–44)
AST: 17 IU/L (ref 0–40)
Albumin: 4.3 g/dL (ref 3.8–4.9)
Alkaline Phosphatase: 96 IU/L (ref 44–121)
Bilirubin Total: 0.8 mg/dL (ref 0.0–1.2)
Bilirubin, Direct: 0.2 mg/dL (ref 0.00–0.40)
Total Protein: 6.9 g/dL (ref 6.0–8.5)

## 2022-02-09 LAB — BASIC METABOLIC PANEL
BUN/Creatinine Ratio: 9 (ref 9–20)
BUN: 9 mg/dL (ref 6–24)
CO2: 26 mmol/L (ref 20–29)
Calcium: 9.3 mg/dL (ref 8.7–10.2)
Chloride: 104 mmol/L (ref 96–106)
Creatinine, Ser: 0.99 mg/dL (ref 0.76–1.27)
Glucose: 102 mg/dL — ABNORMAL HIGH (ref 70–99)
Potassium: 4.1 mmol/L (ref 3.5–5.2)
Sodium: 144 mmol/L (ref 134–144)
eGFR: 91 mL/min/{1.73_m2} (ref >59)

## 2022-02-09 LAB — LIPID PANEL
Chol/HDL Ratio: 3.3 ratio (ref 0.0–5.0)
Cholesterol, Total: 140 mg/dL (ref 100–199)
HDL: 42 mg/dL (ref >39)
LDL Chol Calc (NIH): 66 mg/dL (ref 0–99)
Triglycerides: 194 mg/dL — ABNORMAL HIGH (ref 0–149)
VLDL Cholesterol Cal: 32 mg/dL (ref 5–40)

## 2022-02-27 ENCOUNTER — Ambulatory Visit (HOSPITAL_COMMUNITY)
Admission: RE | Admit: 2022-02-27 | Discharge: 2022-02-27 | Disposition: A | Discharge status: Home / Self Care | Payer: Medicare HMO | Source: Ambulatory Visit | Attending: General Practice | Admitting: General Practice

## 2022-02-27 DIAGNOSIS — K760 Fatty (change of) liver, not elsewhere classified: Secondary | ICD-10-CM | POA: Diagnosis not present

## 2022-02-27 DIAGNOSIS — I712 Thoracic aortic aneurysm, without rupture, unspecified: Secondary | ICD-10-CM | POA: Insufficient documentation

## 2022-02-27 MED ORDER — IOHEXOL 350 MG/ML SOLN
100.0000 mL | Freq: Once | INTRAVENOUS | Status: AC | PRN
Start: 2022-02-27 — End: 2022-02-27
  Administered 2022-02-27: 80 mL via INTRAVENOUS

## 2022-06-28 ENCOUNTER — Encounter: Payer: Self-pay | Admitting: Internal Medicine

## 2022-07-04 ENCOUNTER — Other Ambulatory Visit: Payer: Self-pay | Admitting: Cardiovascular Disease

## 2022-07-04 DIAGNOSIS — E785 Hyperlipidemia, unspecified: Secondary | ICD-10-CM

## 2022-07-24 ENCOUNTER — Encounter: Payer: Self-pay | Admitting: Family Medicine

## 2022-07-24 ENCOUNTER — Ambulatory Visit (INDEPENDENT_AMBULATORY_CARE_PROVIDER_SITE_OTHER): Payer: Medicare HMO | Admitting: Family Medicine

## 2022-07-24 VITALS — BP 152/92 | HR 79 | Temp 98.1°F | Ht 70.25 in | Wt 297.4 lb

## 2022-07-24 DIAGNOSIS — Z720 Tobacco use: Secondary | ICD-10-CM | POA: Diagnosis not present

## 2022-07-24 DIAGNOSIS — I2510 Atherosclerotic heart disease of native coronary artery without angina pectoris: Secondary | ICD-10-CM | POA: Diagnosis not present

## 2022-07-24 DIAGNOSIS — I700 Atherosclerosis of aorta: Secondary | ICD-10-CM

## 2022-07-24 DIAGNOSIS — I252 Old myocardial infarction: Secondary | ICD-10-CM

## 2022-07-24 DIAGNOSIS — I10 Essential (primary) hypertension: Secondary | ICD-10-CM

## 2022-07-24 DIAGNOSIS — Z8616 Personal history of COVID-19: Secondary | ICD-10-CM | POA: Diagnosis not present

## 2022-07-24 DIAGNOSIS — E782 Mixed hyperlipidemia: Secondary | ICD-10-CM

## 2022-07-24 DIAGNOSIS — G4733 Obstructive sleep apnea (adult) (pediatric): Secondary | ICD-10-CM | POA: Diagnosis not present

## 2022-07-24 DIAGNOSIS — Z23 Encounter for immunization: Secondary | ICD-10-CM

## 2022-07-24 DIAGNOSIS — Z7902 Long term (current) use of antithrombotics/antiplatelets: Secondary | ICD-10-CM

## 2022-07-24 DIAGNOSIS — R7301 Impaired fasting glucose: Secondary | ICD-10-CM

## 2022-07-24 DIAGNOSIS — Z0000 Encounter for general adult medical examination without abnormal findings: Secondary | ICD-10-CM

## 2022-07-24 DIAGNOSIS — D125 Benign neoplasm of sigmoid colon: Secondary | ICD-10-CM

## 2022-07-24 DIAGNOSIS — D124 Benign neoplasm of descending colon: Secondary | ICD-10-CM

## 2022-07-24 DIAGNOSIS — G4730 Sleep apnea, unspecified: Secondary | ICD-10-CM

## 2022-07-24 LAB — POCT GLYCOSYLATED HEMOGLOBIN (HGB A1C): Hemoglobin A1C: 5.5 % (ref 4.0–5.6)

## 2022-07-24 MED ORDER — METOPROLOL SUCCINATE ER 50 MG PO TB24: 50.0000 mg | ORAL_TABLET | Freq: Every day | ORAL | 3 refills | Status: DC

## 2022-07-24 MED ORDER — EZETIMIBE 10 MG PO TABS: 10.0000 mg | ORAL_TABLET | Freq: Every day | ORAL | 3 refills | Status: DC

## 2022-07-24 NOTE — Progress Notes (Signed)
Complete physical exam  Patient: Brandon [name]   DOB: 12/20/1970   51 y.o. Male  MRN: [id number]  Subjective:    Chief Complaint  Patient presents with   awv/cpe    Fasting     Brandon [name] is a 51 y.o. male who presents today for a complete physical exam. He reports consuming a general diet. The patient does not participate in regular exercise at present. He generally feels fairly well. He reports sleeping well.  He did state that he had an episode of right-sided chest pain that lasted roughly 5 minutes and then went away.  No associated weakness, diaphoresis, shortness of breath.  Does have a previous history of ASCVD and has seen cardiology within the last year.  He does have a history of hypertension but at this time apparently is not on any medications.He does continue on Lipitor as well as Zetia.  He also continues on Plavix.  He is having no difficulty within these medications.  He was on the path in the past and apparently is on an experimental medication however that has stopped.  He has not followed up with cardiology concerning getting on a new lipid-lowering medication.  He continues to smoke and at this time has no interest in quitting.  He also has sleep apnea but has been unable to tolerate the CPAP machine.  He has a history of colonic polyp but is not sure when he is scheduled for repeat colonoscopy.  He does have a previous history of COVID.  He also has a previous history of depression but presently is having no difficulty with that.  There is a question of aortic aneurysm however recent study showed no evidence of an aneurysm.  He has no other concerns or complaints.   Most recent fall risk assessment:    07/24/2022    1:22 PM  Fall Risk   Falls in the past year? 1  Number falls in past yr: 0  Injury with Fall? 0  Risk for fall due to : No Fall Risks  Follow up Falls evaluation completed     Most recent depression screenings:    07/24/2022    1:23 PM 09/02/2021     3:49 PM  PHQ 2/9 Scores  PHQ - 2 Score 1 0      Patient Active Problem List   Diagnosis Date Noted   History of COVID-19 06/08/2021   Grade II internal hemorrhoids    Adenomatous polyp of sigmoid colon    Adenomatous polyp of descending colon    Rectal polyp    Difficult airway for intubation 08/19/2020   Restrictive lung disease 04/30/2020   OSA (obstructive sleep apnea) 04/30/2020   History of MI (myocardial infarction) 02/25/2020   Atherosclerosis of aorta (HCC) 02/25/2020   Subclavian artery stenosis (HCC) 02/09/2020   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   BPH (benign prostatic hyperplasia) 12/17/2019   Internal carotid artery occlusion, left 12/15/2019   History of kidney stones 03/21/2019   Genital warts 01/23/2019   Antiplatelet or antithrombotic long-term use 01/23/2019   History of renal calculi 01/22/2019   Bilateral hearing loss 12/17/2017   ETD (Eustachian tube dysfunction), bilateral 12/17/2017   Depression, major, in remission (HCC) 12/29/2012   Sleep apnea 12/29/2012   Arteriosclerotic cardiovascular disease (ASCVD)    Tobacco abuse    Hyperlipidemia 05/30/2011   Hypertension 05/30/2011   Morbid obesity (HCC) 05/30/2011   Past Medical History:  Diagnosis  Date   Arteriosclerotic cardiovascular disease (ASCVD) 2006, 2012   2006-acute IMI treated with urgent RCA stent; 2007-[place] for in-stent restenosis; 02/2010-presented with ACS and minimal troponin elevation:70% LAD, 80% distal circumflex, 80% proximal ramus branch vessel,in-stent restenosis of 70% in the RCA; BMS for proximal critical RCA stenosis, restenosis Nov 2012   Carotid artery occlusion    Heart attack ([place])    History of noncompliance with medical treatment    Due to financial considerations   Hyperlipidemia    Hypertension    Stroke [facility])    Tobacco abuse    40 pack years   Past Surgical History:  Procedure Laterality Date    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   Procedure: BYPASS GRAFT CAROTID-SUBCLAVIAN USING HEMASHIELD GOLD GRAFT;  Surgeon: Elam Dutch, MD;  Location: [facility];  Service: Vascular;  Laterality: Left;   COLONOSCOPY WITH PROPOFOL N/A 09/21/2020   Procedure: COLONOSCOPY WITH PROPOFOL;  Surgeon: Lavena Bullion, DO;  Location: [facility];  Service: Gastroenterology;  Laterality: N/A;   CORONARY ANGIOPLASTY WITH STENT PLACEMENT     HEMOSTASIS CLIP PLACEMENT  09/21/2020   Procedure: HEMOSTASIS CLIP PLACEMENT;  Surgeon: Lavena Bullion, DO;  Location: [facility];  Service: Gastroenterology;;   IR ANGIO EXTERNAL CAROTID SEL EXT CAROTID BILAT MOD SED  12/15/2019   IR ANGIO VERTEBRAL SEL VERTEBRAL UNI R MOD SED  12/15/2019   IR ANGIOGRAM EXTREMITY LEFT  12/15/2019   IR ANGIOGRAM EXTREMITY LEFT  12/15/2019   IR CT HEAD LTD  12/15/2019   IR PERCUTANEOUS ART THROMBECTOMY/INFUSION INTRACRANIAL INC DIAG ANGIO  12/15/2019   POLYPECTOMY  09/21/2020   Procedure: POLYPECTOMY;  Surgeon: Lavena Bullion, DO;  Location: [facility];  Service: Gastroenterology;;   RADIOLOGY WITH ANESTHESIA N/A 12/15/2019   Procedure: IR WITH ANESTHESIA;  Surgeon: Luanne Bras, MD;  Location: [facility];  Service: Radiology;  Laterality: N/A;   Social History   Tobacco Use   Smoking status: Every Day    Packs/day: 1.00    Years: 34.00    Total pack years: 34.00    Types: Cigarettes   Smokeless tobacco: Never  Vaping Use   Vaping Use: Never used  Substance Use Topics   Alcohol use: No    Comment:  Alcoholism- quit 2002   Drug use: No   Family History  Problem Relation Age of Onset   Depression Mother 5       Suicide   Heart disease Father 97       Deceased from massive heart-attack   Hyperlipidemia Father    Hypertension Father    COPD Maternal Grandfather    Cancer Paternal Grandmother        Male Cancer   Heart disease Paternal Grandmother    Colon cancer Neg Hx    Pancreatic cancer Neg  Hx    Esophageal cancer Neg Hx    No Known Allergies    Patient Care Team: Denita Lung, MD as PCP - General (Family Medicine) Lorretta Harp, MD as PCP - Cardiology (Cardiology)   Outpatient Medications Prior to Visit  Medication Sig Note   acetaminophen (TYLENOL) 500 MG tablet Take 1,000 mg by mouth every 6 (six) hours as needed for mild pain or moderate pain. 07/24/2022: Prn last dose today    aspirin EC 81 MG tablet Take 1 tablet (81 mg total) by mouth daily. Swallow whole.    atorvastatin (LIPITOR) 80 MG tablet Take 1 tablet by mouth once daily  clopidogrel (PLAVIX) 75 MG tablet Take 1 tablet (75 mg total) by mouth daily.    ezetimibe (ZETIA) 10 MG tablet Take 1 tablet (10 mg total) by mouth daily.    albuterol (VENTOLIN HFA) 108 (90 Base) MCG/ACT inhaler Inhale 2 puffs into the lungs every 6 (six) hours as needed. (Patient not taking: Reported on 07/24/2022)    Evolocumab (REPATHA SURECLICK) 536 MG/ML SOAJ Inject 1 Dose into the skin every 14 (fourteen) days. (Patient not taking: Reported on 07/24/2022)    No facility-administered medications prior to visit.    Review of Systems  All other systems reviewed and are negative.         Objective:     BP (!) 152/92   Pulse 79   Temp 98.1 F (36.7 C)   Ht 5' 10.25" (1.784 m)   Wt 297 lb 6.4 oz (134.9 kg)   SpO2 95%   BMI 42.37 kg/m  BP Readings from Last 3 Encounters:  07/24/22 (!) 152/92  01/23/22 124/88  06/23/21 130/80   Wt Readings from Last 3 Encounters:  07/24/22 297 lb 6.4 oz (134.9 kg)  01/23/22 (!) 302 lb 6.4 oz (137.2 kg)  09/02/21 300 lb (136.1 kg)      Physical Exam  Alert and in no distress. Tympanic membranes and canals are normal. Pharyngeal area is normal. Neck is supple without adenopathy or thyromegaly. Cardiac exam shows a regular sinus rhythm without murmurs or gallops. Lungs are clear to auscultation.  Results for orders placed or performed in visit on 07/24/22  POCT glycosylated  hemoglobin (Hb A1C)  Result Value Ref Range   Hemoglobin A1C 5.5 4.0 - 5.6 %   HbA1c POC (<> result, manual entry)     HbA1c, POC (prediabetic range)     HbA1c, POC (controlled diabetic range)     Last CBC Lab Results  Component Value Date   WBC 10.5 02/19/2020   HGB 13.9 02/19/2020   HCT 40.9 02/19/2020   MCV 91.7 02/19/2020   MCH 31.2 02/19/2020   RDW 13.2 02/19/2020   PLT 252 64/40/3474   Last metabolic panel Lab Results  Component Value Date   GLUCOSE 102 (H) 02/08/2022   NA 144 02/08/2022   K 4.1 02/08/2022   CL 104 02/08/2022   CO2 26 02/08/2022   BUN 9 02/08/2022   CREATININE 0.99 02/08/2022   EGFR 91 02/08/2022   CALCIUM 9.3 02/08/2022   PROT 6.9 02/08/2022   ALBUMIN 4.3 02/08/2022   LABGLOB 2.7 10/31/2019   AGRATIO 1.6 10/31/2019   BILITOT 0.8 02/08/2022   ALKPHOS 96 02/08/2022   AST 17 02/08/2022   ALT 27 02/08/2022   ANIONGAP 12 02/19/2020   Last lipids Lab Results  Component Value Date   CHOL 140 02/08/2022   HDL 42 02/08/2022   LDLCALC 66 02/08/2022   TRIG 194 (H) 02/08/2022   CHOLHDL 3.3 02/08/2022   Last hemoglobin A1c Lab Results  Component Value Date   HGBA1C 5.5 07/24/2022        Assessment & Plan:    Encounter for Medicare annual wellness exam  Arteriosclerotic cardiovascular disease (ASCVD)  Morbid obesity ([place])  Mixed hyperlipidemia  Tobacco abuse  Sleep apnea, unspecified type  OSA (obstructive sleep apnea)  History of COVID-19  Atherosclerosis of aorta (HCC)  History of MI (myocardial infarction)  Adenomatous polyp of sigmoid colon  Adenomatous polyp of descending colon  Antiplatelet or antithrombotic long-term use  Primary hypertension  Need for influenza vaccination - Plan: Flu  Vaccine QUAD 48mo+IM (Fluarix, Fluzone & Alfiuria Quad PF)  Impaired fasting glucose - Plan: POCT glycosylated hemoglobin (Hb A1C)  Immunization History  Administered Date(s) Administered   Influenza Whole 06/27/2011    Influenza,inj,Quad PF,6+ Mos 06/19/2017, 07/24/2022   Moderna Sars-Covid-2 Vaccination 10/28/2020   PFIZER(Purple Top)SARS-COV-2 Vaccination 01/15/2020, 02/07/2020   PNEUMOCOCCAL CONJUGATE-20 06/08/2021   Tdap 04/29/2020    Health Maintenance  Topic Date Due   Zoster Vaccines- Shingrix (1 of 2) 10/24/2022 (Originally [birth year]/31/1987)   TETANUS/TDAP  04/29/2030   COLONOSCOPY (Pts 45-22yrs Insurance coverage will need to be confirmed)  09/21/2030   INFLUENZA VACCINE  Completed   Hepatitis C Screening  Completed   HIV Screening  Completed   HPV VACCINES  Aged Out   COVID-19 Vaccine  Discontinued    Discussed health benefits of physical activity, and encouraged him to engage in regular exercise appropriate for his age and condition.  I also discussed smoking cessation with him however at this point he is not interested.  Encouraged him to call gastroenterology to find out when we have him scheduled for repeat colonoscopy.  He is also to follow-up with cardiology to determine his lipid status and possibility of getting on a different medication. I will place him on metoprolol to help with his blood pressure and cardiac status. Problem List Items Addressed This Visit     Adenomatous polyp of descending colon   Adenomatous polyp of sigmoid colon   Antiplatelet or antithrombotic long-term use   Arteriosclerotic cardiovascular disease (ASCVD)   Relevant Medications   metoprolol succinate (TOPROL-XL) 50 MG 24 hr tablet   Atherosclerosis of aorta (HCC)   Relevant Medications   metoprolol succinate (TOPROL-XL) 50 MG 24 hr tablet   History of COVID-19   History of MI (myocardial infarction)   Hyperlipidemia   Relevant Medications   metoprolol succinate (TOPROL-XL) 50 MG 24 hr tablet   Hypertension   Relevant Medications   metoprolol succinate (TOPROL-XL) 50 MG 24 hr tablet   Morbid obesity (HCC)   OSA (obstructive sleep apnea)   Sleep apnea   Tobacco abuse   Other Visit Diagnoses      Encounter for Medicare annual wellness exam    -  Primary   Need for influenza vaccination       Relevant Orders   Flu Vaccine QUAD 18mo+IM (Fluarix, Fluzone & Alfiuria Quad PF) (Completed)   Impaired fasting glucose       Relevant Orders   POCT glycosylated hemoglobin (Hb A1C) (Completed)      Return in about 1 year (around 07/25/2023) for awv/cpe.     Jill Alexanders, MD

## 2022-07-24 NOTE — Progress Notes (Deleted)
Brandon [name] is a 51 y.o. male who presents for annual wellness visit and follow-up on chronic medical conditions.  He has the following concerns:   Immunizations and Health Maintenance Immunization History  Administered Date(s) Administered   Influenza Whole 06/27/2011   Influenza,inj,Quad PF,6+ Mos 06/19/2017   Moderna Sars-Covid-2 Vaccination 10/28/2020   PFIZER(Purple Top)SARS-COV-2 Vaccination 01/15/2020, 02/07/2020   PNEUMOCOCCAL CONJUGATE-20 06/08/2021   Tdap 04/29/2020   Health Maintenance Due  Topic Date Due   Zoster Vaccines- Shingrix (1 of 2) Never done   COVID-19 Vaccine (4 - Mixed Product risk series) 12/23/2020   INFLUENZA VACCINE  05/23/2022    Last colonoscopy: Last PSA: Dentist: Ophtho: Exercise:  Other doctors caring for patient include:  Advanced Directives:    Depression screen:  See questionnaire below.        09/02/2021    3:49 PM 06/08/2021    3:18 PM 04/29/2020    2:14 PM 06/19/2017    9:58 AM  Depression screen PHQ 2/9  Decreased Interest 0 0 0 0  Down, Depressed, Hopeless 0 0 0 0  PHQ - 2 Score 0 0 0 0    Fall Screen: See Questionaire below.      09/02/2021    3:49 PM 06/19/2017    9:58 AM  Fall Risk   Falls in the past year? 0 No  Risk for fall due to : Medication side effect   Follow up Falls evaluation completed;Education provided;Falls prevention discussed     ADL screen:  See questionnaire below.  Functional Status Survey:     Review of Systems  Constitutional: -, -unexpected weight change, -anorexia, -fatigue Allergy: -sneezing, -itching, -congestion Dermatology: denies changing moles, rash, lumps ENT: -runny nose, -ear pain, -sore throat,  Cardiology:  -chest pain, -palpitations, -orthopnea, Respiratory: -cough, -shortness of breath, -dyspnea on exertion, -wheezing,  Gastroenterology: -abdominal pain, -nausea, -vomiting, -diarrhea, -constipation, -dysphagia Hematology: -bleeding or bruising  problems Musculoskeletal: -arthralgias, -myalgias, -joint swelling, -back pain, - Ophthalmology: -vision changes,  Urology: -dysuria, -difficulty urinating,  -urinary frequency, -urgency, incontinence Neurology: -, -numbness, , -memory loss, -falls, -dizziness    PHYSICAL EXAM:  There were no vitals taken for this visit.  General Appearance: Alert, cooperative, no distress, appears stated age Head: Normocephalic, without obvious abnormality, atraumatic Eyes: PERRL, conjunctiva/corneas clear, EOM's intact, fundi benign Ears: Normal TM's and external ear canals Nose: Nares normal, mucosa normal, no drainage or sinus   tenderness Throat: Lips, mucosa, and tongue normal; teeth and gums normal Neck: Supple, no lymphadenopathy, thyroid:no enlargement/tenderness/nodules; no carotid bruit or JVD Lungs: Clear to auscultation bilaterally without wheezes, rales or ronchi; respirations unlabored Heart: Regular rate and rhythm, S1 and S2 normal, no murmur, rub or gallop Abdomen: Soft, non-tender, nondistended, normoactive bowel sounds, no masses, no hepatosplenomegaly Extremities: No clubbing, cyanosis or edema Pulses: 2+ and symmetric all extremities Skin: Skin color, texture, turgor normal, no rashes or lesions Lymph nodes: Cervical, supraclavicular, and axillary nodes normal Neurologic: CNII-XII intact, normal strength, sensation and gait; reflexes 2+ and symmetric throughout   Psych: Normal mood, affect, hygiene and grooming  ASSESSMENT/PLAN: No diagnosis found.    Discussed PSA screening (risks/benefits), recommended at least 30 minutes of aerobic activity at least 5 days/week; proper sunscreen use reviewed; healthy diet and alcohol recommendations (less than or equal to 2 drinks/day) reviewed; regular seatbelt use; changing batteries in smoke detectors. Immunization recommendations discussed.  Colonoscopy recommendations reviewed.   Medicare Attestation I have personally reviewed: The  patient's medical and social history Their use of  alcohol, tobacco or illicit drugs Their current medications and supplements The patient's functional ability including ADLs,fall risks, home safety risks, cognitive, and hearing and visual impairment Diet and physical activities Evidence for depression or mood disorders  The patient's weight, height, and BMI have been recorded in the chart.  I have made referrals, counseling, and provided education to the patient based on review of the above and I have provided the patient with a written personalized care plan for preventive services.     Jill Alexanders, MD   07/24/2022

## 2022-07-24 NOTE — Patient Instructions (Addendum)
Call Dr. Naida Sleight office concerning the trial medication and now on nothing also call about when to get your repeat colonoscopy Health Maintenance, Male Adopting a healthy lifestyle and getting preventive care are important in promoting health and wellness. Ask your health care provider about: The right schedule for you to have regular tests and exams. Things you can do on your own to prevent diseases and keep yourself healthy. What should I know about diet, weight, and exercise? Eat a healthy diet  Eat a diet that includes plenty of vegetables, fruits, low-fat dairy products, and lean protein. Do not eat a lot of foods that are high in solid fats, added sugars, or sodium. Maintain a healthy weight Body mass index (BMI) is a measurement that can be used to identify possible weight problems. It estimates body fat based on height and weight. Your health care provider can help determine your BMI and help you achieve or maintain a healthy weight. Get regular exercise Get regular exercise. This is one of the most important things you can do for your health. Most adults should: Exercise for at least 150 minutes each week. The exercise should increase your heart rate and make you sweat (moderate-intensity exercise). Do strengthening exercises at least twice a week. This is in addition to the moderate-intensity exercise. Spend less time sitting. Even light physical activity can be beneficial. Watch cholesterol and blood lipids Have your blood tested for lipids and cholesterol at 53 years of age, then have this test every 5 years. You may need to have your cholesterol levels checked more often if: Your lipid or cholesterol levels are high. You are older than 53 years of age. You are at high risk for heart disease. What should I know about cancer screening? Many types of cancers can be detected early and may often be prevented. Depending on your health history and family history, you may need to have  cancer screening at various ages. This may include screening for: Colorectal cancer. Prostate cancer. Skin cancer. Lung cancer. What should I know about heart disease, diabetes, and high blood pressure? Blood pressure and heart disease High blood pressure causes heart disease and increases the risk of stroke. This is more likely to develop in people who have high blood pressure readings or are overweight. Talk with your health care provider about your target blood pressure readings. Have your blood pressure checked: Every 3-5 years if you are 69-20 years of age. Every year if you are 65 years old or older. If you are between the ages of 59 and 74 and are a current or former smoker, ask your health care provider if you should have a one-time screening for abdominal aortic aneurysm (AAA). Diabetes Have regular diabetes screenings. This checks your fasting blood sugar level. Have the screening done: Once every three years after age 44 if you are at a normal weight and have a low risk for diabetes. More often and at a younger age if you are overweight or have a high risk for diabetes. What should I know about preventing infection? Hepatitis B If you have a higher risk for hepatitis B, you should be screened for this virus. Talk with your health care provider to find out if you are at risk for hepatitis B infection. Hepatitis C Blood testing is recommended for: Everyone born from [birth year] through 1967. Anyone with known risk factors for hepatitis C. Sexually transmitted infections (STIs) You should be screened each year for STIs, including gonorrhea and chlamydia, if: You  are sexually active and are younger than 53 years of age. You are older than 53 years of age and your health care provider tells you that you are at risk for this type of infection. Your sexual activity has changed since you were last screened, and you are at increased risk for chlamydia or gonorrhea. Ask your health care  provider if you are at risk. Ask your health care provider about whether you are at high risk for HIV. Your health care provider may recommend a prescription medicine to help prevent HIV infection. If you choose to take medicine to prevent HIV, you should first get tested for HIV. You should then be tested every 3 months for as long as you are taking the medicine. Follow these instructions at home: Alcohol use Do not drink alcohol if your health care provider tells you not to drink. If you drink alcohol: Limit how much you have to 0-2 drinks a day. Know how much alcohol is in your drink. In the [place] one drink equals one 12 oz bottle of beer (355 mL), one 5 oz glass of wine (148 mL), or one 1 oz glass of hard liquor (44 mL). Lifestyle Do not use any products that contain nicotine or tobacco. These products include cigarettes, chewing tobacco, and vaping devices, such as e-cigarettes. If you need help quitting, ask your health care provider. Do not use street drugs. Do not share needles. Ask your health care provider for help if you need support or information about quitting drugs. General instructions Schedule regular health, dental, and eye exams. Stay current with your vaccines. Tell your health care provider if: You often feel depressed. You have ever been abused or do not feel safe at home. Summary Adopting a healthy lifestyle and getting preventive care are important in promoting health and wellness. Follow your health care provider's instructions about healthy diet, exercising, and getting tested or screened for diseases. Follow your health care provider's instructions on monitoring your cholesterol and blood pressure. This information is not intended to replace advice given to you by your health care provider. Make sure you discuss any questions you have with your health care provider. Document Revised: 02/28/2021 Document Reviewed: 02/28/2021 Elsevier Patient Education  [place].

## 2022-08-14 ENCOUNTER — Encounter: Payer: Self-pay | Admitting: Internal Medicine

## 2022-09-08 ENCOUNTER — Ambulatory Visit: Payer: Medicare HMO

## 2022-09-26 ENCOUNTER — Telehealth: Payer: Self-pay | Admitting: Family Medicine

## 2022-09-26 NOTE — Telephone Encounter (Signed)
Sent verification form to [facility] fax [phone] for patient.

## 2022-09-27 ENCOUNTER — Telehealth: Payer: Self-pay | Admitting: Internal Medicine

## 2022-09-27 NOTE — Telephone Encounter (Signed)
  Pt c/o medication issue:  1. Name of Medication:   Evolocumab (REPATHA SURECLICK) 484 MG/ML SOAJ    2. How are you currently taking this medication (dosage and times per day)? Inject 1 Dose into the skin every 14 (fourteen) days.   3. Are you having a reaction (difficulty breathing--STAT)? No   4. What is your medication issue? Pt would like to re-enroll for pt assistance or if Dr. Debara Pickett will prescribe him a different medication because he cant afford the cost of repatha

## 2022-09-28 NOTE — Telephone Encounter (Signed)
Left message that I will send patient assistance options via MyChart

## 2022-09-30 ENCOUNTER — Other Ambulatory Visit: Payer: Self-pay | Admitting: Internal Medicine

## 2022-10-20 ENCOUNTER — Ambulatory Visit (INDEPENDENT_AMBULATORY_CARE_PROVIDER_SITE_OTHER): Payer: Medicare HMO

## 2022-10-20 VITALS — Ht 69.0 in | Wt 300.0 lb

## 2022-10-20 DIAGNOSIS — Z0000 Encounter for general adult medical examination without abnormal findings: Secondary | ICD-10-CM | POA: Diagnosis not present

## 2022-10-20 NOTE — Patient Instructions (Signed)
Brandon [name] , Thank you for taking time to come for your Medicare Wellness Visit. I appreciate your ongoing commitment to your health goals. Please review the following plan we discussed and let me know if I can assist you in the future.   These are the goals we discussed:  Goals      Patient Stated     09/02/2021, wants to get under 200 pounds and quit smoking     Patient Stated     10/20/2022, wants to lose weight        This is a list of the screening recommended for you and due dates:  Health Maintenance  Topic Date Due   Zoster (Shingles) Vaccine (1 of 2) 10/24/2022*   Screening for Lung Cancer  02/28/2023   Medicare Annual Wellness Visit  10/21/2023   DTaP/Tdap/Td vaccine (2 - Td or Tdap) 04/29/2030   Colon Cancer Screening  09/21/2030   Flu Shot  Completed   Hepatitis C Screening: USPSTF Recommendation to screen - Ages 18-79 yo.  Completed   HIV Screening  Completed   HPV Vaccine  Aged Out   COVID-19 Vaccine  Discontinued  *Topic was postponed. The date shown is not the original due date.    Advanced directives: Advance directive discussed with you today.   Conditions/risks identified: smoking  Next appointment: Follow up in one year for your annual wellness visit   Preventive Care 40-64 Years, Male Preventive care refers to lifestyle choices and visits with your health care provider that can promote health and wellness. What does preventive care include? A yearly physical exam. This is also called an annual well check. Dental exams once or twice a year. Routine eye exams. Ask your health care provider how often you should have your eyes checked. Personal lifestyle choices, including: Daily care of your teeth and gums. Regular physical activity. Eating a healthy diet. Avoiding tobacco and drug use. Limiting alcohol use. Practicing safe sex. Taking low-dose aspirin every day starting at age 67. What happens during an annual well check? The services and  screenings done by your health care provider during your annual well check will depend on your age, overall health, lifestyle risk factors, and family history of disease. Counseling  Your health care provider may ask you questions about your: Alcohol use. Tobacco use. Drug use. Emotional well-being. Home and relationship well-being. Sexual activity. Eating habits. Work and work Statistician. Screening  You may have the following tests or measurements: Height, weight, and BMI. Blood pressure. Lipid and cholesterol levels. These may be checked every 5 years, or more frequently if you are over 74 years old. Skin check. Lung cancer screening. You may have this screening every year starting at age 84 if you have a 30-pack-year history of smoking and currently smoke or have quit within the past 15 years. Fecal occult blood test (FOBT) of the stool. You may have this test every year starting at age 67. Flexible sigmoidoscopy or colonoscopy. You may have a sigmoidoscopy every 5 years or a colonoscopy every 10 years starting at age 52. Prostate cancer screening. Recommendations will vary depending on your family history and other risks. Hepatitis C blood test. Hepatitis B blood test. Sexually transmitted disease (STD) testing. Diabetes screening. This is done by checking your blood sugar (glucose) after you have not eaten for a while (fasting). You may have this done every 1-3 years. Discuss your test results, treatment options, and if necessary, the need for more tests with your health care  provider. Vaccines  Your health care provider may recommend certain vaccines, such as: Influenza vaccine. This is recommended every year. Tetanus, diphtheria, and acellular pertussis (Tdap, Td) vaccine. You may need a Td booster every 10 years. Zoster vaccine. You may need this after age 41. Pneumococcal 13-valent conjugate (PCV13) vaccine. You may need this if you have certain conditions and have not been  vaccinated. Pneumococcal polysaccharide (PPSV23) vaccine. You may need one or two doses if you smoke cigarettes or if you have certain conditions. Talk to your health care provider about which screenings and vaccines you need and how often you need them. This information is not intended to replace advice given to you by your health care provider. Make sure you discuss any questions you have with your health care provider. Document Released: 11/05/2015 Document Revised: 06/28/2016 Document Reviewed: 08/10/2015 Elsevier Interactive Patient Education  2017 [place] Prevention in the Home Falls can cause injuries. They can happen to people of all ages. There are many things you can do to make your home safe and to help prevent falls. What can I do on the outside of my home? Regularly fix the edges of walkways and driveways and fix any cracks. Remove anything that might make you trip as you walk through a door, such as a raised step or threshold. Trim any bushes or trees on the path to your home. Use bright outdoor lighting. Clear any walking paths of anything that might make someone trip, such as rocks or tools. Regularly check to see if handrails are loose or broken. Make sure that both sides of any steps have handrails. Any raised decks and porches should have guardrails on the edges. Have any leaves, snow, or ice cleared regularly. Use sand or salt on walking paths during winter. Clean up any spills in your garage right away. This includes oil or grease spills. What can I do in the bathroom? Use night lights. Install grab bars by the toilet and in the tub and shower. Do not use towel bars as grab bars. Use non-skid mats or decals in the tub or shower. If you need to sit down in the shower, use a plastic, non-slip stool. Keep the floor dry. Clean up any water that spills on the floor as soon as it happens. Remove soap buildup in the tub or shower regularly. Attach bath mats  securely with double-sided non-slip rug tape. Do not have throw rugs and other things on the floor that can make you trip. What can I do in the bedroom? Use night lights. Make sure that you have a light by your bed that is easy to reach. Do not use any sheets or blankets that are too big for your bed. They should not hang down onto the floor. Have a firm chair that has side arms. You can use this for support while you get dressed. Do not have throw rugs and other things on the floor that can make you trip. What can I do in the kitchen? Clean up any spills right away. Avoid walking on wet floors. Keep items that you use a lot in easy-to-reach places. If you need to reach something above you, use a strong step stool that has a grab bar. Keep electrical cords out of the way. Do not use floor polish or wax that makes floors slippery. If you must use wax, use non-skid floor wax. Do not have throw rugs and other things on the floor that can make you trip.  What can I do with my stairs? Do not leave any items on the stairs. Make sure that there are handrails on both sides of the stairs and use them. Fix handrails that are broken or loose. Make sure that handrails are as long as the stairways. Check any carpeting to make sure that it is firmly attached to the stairs. Fix any carpet that is loose or worn. Avoid having throw rugs at the top or bottom of the stairs. If you do have throw rugs, attach them to the floor with carpet tape. Make sure that you have a light switch at the top of the stairs and the bottom of the stairs. If you do not have them, ask someone to add them for you. What else can I do to help prevent falls? Wear shoes that: Do not have high heels. Have rubber bottoms. Are comfortable and fit you well. Are closed at the toe. Do not wear sandals. If you use a stepladder: Make sure that it is fully opened. Do not climb a closed stepladder. Make sure that both sides of the stepladder  are locked into place. Ask someone to hold it for you, if possible. Clearly mark and make sure that you can see: Any grab bars or handrails. First and last steps. Where the edge of each step is. Use tools that help you move around (mobility aids) if they are needed. These include: Canes. Walkers. Scooters. Crutches. Turn on the lights when you go into a dark area. Replace any light bulbs as soon as they burn out. Set up your furniture so you have a clear path. Avoid moving your furniture around. If any of your floors are uneven, fix them. If there are any pets around you, be aware of where they are. Review your medicines with your doctor. Some medicines can make you feel dizzy. This can increase your chance of falling. Ask your doctor what other things that you can do to help prevent falls. This information is not intended to replace advice given to you by your health care provider. Make sure you discuss any questions you have with your health care provider. Document Released: 08/05/2009 Document Revised: 03/16/2016 Document Reviewed: 11/13/2014 Elsevier Interactive Patient Education  2017 [employer].

## 2022-10-20 NOTE — Progress Notes (Signed)
I connected with  [name] today via telehealth video enabled device and verified that I am speaking with the correct person using two identifiers.   Location: Patient: home Provider: work  Persons participating in virtual visit: Kevyn Boquet, Glenna Durand LPN  I discussed the limitations, risks, security and privacy concerns of performing an evaluation and management service by video and the availability of in person appointments. The patient expressed understanding and agreed to proceed.   Some vital signs may be absent or patient reported.     Subjective:   Brandon [name] is a 51 y.o. male who presents for Medicare Annual/Subsequent preventive examination.  Review of Systems     Cardiac Risk Factors include: advanced age (>19mn, >>23women);dyslipidemia;hypertension;male gender;obesity (BMI >30kg/m2);smoking/ tobacco exposure     Objective:    Today's Vitals   10/20/22 0959  Weight: 300 lb (136.1 kg)  Height: '5\' 9"'$  (1.753 m)   Body mass index is 44.3 kg/m.     10/20/2022   10:02 AM 07/24/2022    1:22 PM 09/02/2021    3:48 PM 09/21/2020   10:50 AM 02/09/2020    5:58 AM 02/04/2020   10:08 AM 12/23/2019    5:17 PM  Advanced Directives  Does Patient Have a Medical Advance Directive? No No No No No No No  Would patient like information on creating a medical advance directive?  Yes (ED - Information included in AVS)    No - Patient declined No - Patient declined    Current Medications (verified) Outpatient Encounter Medications as of 10/20/2022  Medication Sig   acetaminophen (TYLENOL) 500 MG tablet Take 1,000 mg by mouth every 6 (six) hours as needed for mild pain or moderate pain.   aspirin EC 81 MG tablet Take 1 tablet (81 mg total) by mouth daily. Swallow whole.   atorvastatin (LIPITOR) 80 MG tablet Take 1 tablet by mouth once daily   clopidogrel (PLAVIX) 75 MG tablet Take 1 tablet (75 mg total) by mouth daily.   Evolocumab (REPATHA SURECLICK) 1952MG/ML  SOAJ INJECT 1 DOSE INTO THE SKIN EVERY 14 DAYS   ezetimibe (ZETIA) 10 MG tablet Take 1 tablet (10 mg total) by mouth daily.   metoprolol succinate (TOPROL-XL) 50 MG 24 hr tablet Take 1 tablet (50 mg total) by mouth daily. Take with or immediately following a meal.   albuterol (VENTOLIN HFA) 108 (90 Base) MCG/ACT inhaler Inhale 2 puffs into the lungs every 6 (six) hours as needed. (Patient not taking: Reported on 07/24/2022)   No facility-administered encounter medications on file as of 10/20/2022.    Allergies (verified) Patient has no known allergies.   History: Past Medical History:  Diagnosis Date   Arteriosclerotic cardiovascular disease (ASCVD) 2006, 2012   2006-acute IMI treated with urgent RCA stent; 2007-[place] for in-stent restenosis; 02/2010-presented with ACS and minimal troponin elevation:70% LAD, 80% distal circumflex, 80% proximal ramus branch vessel,in-stent restenosis of 70% in the RCA; BMS for proximal critical RCA stenosis, restenosis Nov 2012   Carotid artery occlusion    [place]    History of noncompliance with medical treatment    Due to financial considerations   Hyperlipidemia    Hypertension    Stroke ([facility]    Tobacco abuse    40 pack years   Past Surgical History:  Procedure Laterality Date   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   Procedure: BYPASS GRAFT CAROTID-SUBCLAVIAN USING HEMASHIELD GOLD GRAFT;  Surgeon: FElam Dutch MD;  Location:  [facility] OR;  Service: Vascular;  Laterality: Left;   COLONOSCOPY WITH PROPOFOL N/A 09/21/2020   Procedure: COLONOSCOPY WITH PROPOFOL;  Surgeon: Lavena Bullion, DO;  Location: [facility] ENDOSCOPY;  Service: Gastroenterology;  Laterality: N/A;   CORONARY ANGIOPLASTY WITH STENT PLACEMENT     HEMOSTASIS CLIP PLACEMENT  09/21/2020   Procedure: HEMOSTASIS CLIP PLACEMENT;  Surgeon: Lavena Bullion, DO;  Location: [facility] END[facility];  Service: Gastroenterology;;   IR ANGIO EXTERNAL CAROTID SEL EXT CAROTID  BILAT MOD SED  12/15/2019   IR ANGIO VERTEBRAL SEL VERTEBRAL UNI R MOD SED  12/15/2019   IR ANGIOGRAM EXTREMITY LEFT  12/15/2019   IR ANGIOGRAM EXTREMITY LEFT  12/15/2019   IR CT HEAD LTD  12/15/2019   IR PERCUTANEOUS ART THROMBECTOMY/INFUSION INTRACRANIAL INC DIAG ANGIO  12/15/2019   POLYPECTOMY  09/21/2020   Procedure: POLYPECTOMY;  Surgeon: Lavena Bullion, DO;  Location: [facility] END[facility];  Service: Gastroenterology;;   RADIOLOGY WITH ANESTHESIA N/A 12/15/2019   Procedure: IR WITH ANESTHESIA;  Surgeon: Luanne Bras, MD;  Location: [facility];  Service: Radiology;  Laterality: N/A;   Family History  Problem Relation Age of Onset   Depression Mother 24       Suicide   Heart disease Father 42       Deceased from massive heart-attack   Hyperlipidemia Father    Hypertension Father    COPD Maternal Grandfather    Cancer Paternal Grandmother        Male Cancer   Heart disease Paternal Grandmother    Colon cancer Neg Hx    Pancreatic cancer Neg Hx    Esophageal cancer Neg Hx    Social History   Socioeconomic History   Marital status: Widowed    Spouse name: Not on file   Number of children: 2   Years of education: Not on file   Highest education level: Not on file  Occupational History   Occupation: Disabled    Comment: Previously employed as an Programmer, systems  Tobacco Use   Smoking status: Every Day    Packs/day: 1.00    Years: 34.00    Total pack years: 34.00    Types: Cigarettes   Smokeless tobacco: Never  Vaping Use   Vaping Use: Never used  Substance and Sexual Activity   Alcohol use: No    Comment:  Alcoholism- quit 2002   Drug use: No   Sexual activity: Not Currently  Other Topics Concern   Not on file  Social History Narrative            Social Determinants of Health   Financial Resource Strain: Low Risk  (10/20/2022)   Overall Financial Resource Strain (CARDIA)    Difficulty of Paying Living Expenses: Not hard at all  Food Insecurity: No Food  Insecurity (10/20/2022)   Hunger Vital Sign    Worried About Running Out of Food in the Last Year: Never true    Ran Out of Food in the Last Year: Never true  Transportation Needs: No Transportation Needs (10/20/2022)   PRAPARE - Hydrologist (Medical): No    Lack of Transportation (Non-Medical): No  Physical Activity: Inactive (10/20/2022)   Exercise Vital Sign    Days of Exercise per Week: 0 days    Minutes of Exercise per Session: 0 min  Stress: No Stress Concern Present (10/20/2022)   [place]    Feeling of Stress : Not at all  Social Connections: Not on file    Tobacco Counseling Ready to quit: Yes Counseling given: Not Answered   Clinical Intake:  Pre-visit preparation completed: Yes  Pain : No/denies pain     Nutritional Status: BMI > 30  Obese Nutritional Risks: None Diabetes: No  How often do you need to have someone help you when you read instructions, pamphlets, or other written materials from your doctor or pharmacy?: 1 - Never  Diabetic? no  Interpreter Needed?: No  Information entered by :: NAllen LPN   Activities of Daily Living    10/20/2022   10:03 AM 07/24/2022    1:23 PM  In your present state of health, do you have any difficulty performing the following activities:  Hearing? 1 0  Vision? 0 0  Difficulty concentrating or making decisions? 0 0  Walking or climbing stairs? 0 0  Dressing or bathing? 0 0  Doing errands, shopping? 0 0  Preparing Food and eating ? N N  Using the Toilet? N N  In the past six months, have you accidently leaked urine? N N  Do you have problems with loss of bowel control? N N  Managing your Medications? N N  Managing your Finances? N N  Housekeeping or managing your Housekeeping? N N    Patient Care Team: Denita Lung, MD as PCP - General (Family Medicine) Lorretta Harp, MD as PCP - Cardiology  (Cardiology)  Indicate any recent Medical Services you may have received from other than [facility] providers in the past year (date may be approximate).     Assessment:   This is a routine wellness examination for Imir.  Hearing/Vision screen Vision Screening - Comments:: No regular eye exams,  Dietary issues and exercise activities discussed: Current Exercise Habits: The patient does not participate in regular exercise at present   Goals Addressed             This Visit's Progress    Patient Stated       10/20/2022, wants to lose weight       Depression Screen    10/20/2022   10:02 AM 07/24/2022    1:23 PM 09/02/2021    3:49 PM 06/08/2021    3:18 PM 04/29/2020    2:14 PM 06/19/2017    9:58 AM  PHQ 2/9 Scores  PHQ - 2 Score 0 1 0 0 0 0  PHQ- 9 Score 0         Fall Risk    10/20/2022   10:02 AM 07/24/2022    1:22 PM 09/02/2021    3:49 PM 06/19/2017    9:58 AM  [place] in the past year? 0 1 0 No  Number falls in past yr: 0 0    Injury with Fall? 0 0    Risk for fall due to : Medication side effect No Fall Risks Medication side effect   Follow up Falls prevention discussed;Education provided;Falls evaluation completed Falls evaluation completed Falls evaluation completed;Education provided;Falls prevention discussed     FALL RISK PREVENTION PERTAINING TO THE HOME:  Any stairs in or around the home? Yes  If so, are there any without handrails? No  Home free of loose throw rugs in walkways, pet beds, electrical cords, etc? Yes  Adequate lighting in your home to reduce risk of falls? Yes   ASSISTIVE DEVICES UTILIZED TO PREVENT FALLS:  Life alert? No  Use of a cane, walker or w/c? No  Grab bars in  the bathroom? No  Shower chair or bench in shower? No  Elevated toilet seat or a handicapped toilet? No   TIMED UP AND GO:  Was the test performed? No .      Cognitive Function:        10/20/2022   10:04 AM 09/02/2021    3:52 PM  6CIT Screen  What  Year? 0 points 0 points  What month? 0 points 0 points  What time? 0 points 0 points  Count back from 20 0 points 0 points  Months in reverse 0 points 0 points  Repeat phrase 4 points 2 points  Total Score 4 points 2 points    Immunizations Immunization History  Administered Date(s) Administered   Influenza Whole 06/27/2011   Influenza,inj,Quad PF,6+ Mos 06/19/2017, 07/24/2022   Moderna Sars-Covid-2 Vaccination 10/28/2020   PFIZER(Purple Top)SARS-COV-2 Vaccination 01/15/2020, 02/07/2020   PNEUMOCOCCAL CONJUGATE-20 06/08/2021   Tdap 04/29/2020    TDAP status: Up to date  Flu Vaccine status: Up to date  Pneumococcal vaccine status: Up to date  Covid-19 vaccine status: Completed vaccines  Qualifies for Shingles Vaccine? Yes   Zostavax completed No   Shingrix Completed?: No.    Education has been provided regarding the importance of this vaccine. Patient has been advised to call insurance company to determine out of pocket expense if they have not yet received this vaccine. Advised may also receive vaccine at local pharmacy or Health Dept. Verbalized acceptance and understanding.  Screening Tests Health Maintenance  Topic Date Due   Medicare Annual Wellness (AWV)  09/02/2022   Zoster Vaccines- Shingrix (1 of 2) 10/24/2022 (Originally 12/31/1989)   Lung Cancer Screening  02/28/2023   DTaP/Tdap/Td (2 - Td or Tdap) 04/29/2030   COLONOSCOPY (Pts 45-17yr Insurance coverage will need to be confirmed)  09/21/2030   INFLUENZA VACCINE  Completed   Hepatitis C Screening  Completed   HIV Screening  Completed   HPV VACCINES  Aged Out   COVID-19 Vaccine  Discontinued    Health Maintenance  Health Maintenance Due  Topic Date Due   Medicare Annual Wellness (AWV)  09/02/2022    Colorectal cancer screening: Type of screening: Colonoscopy. Completed 09/21/2020. Repeat every 3 years  Lung Cancer Screening: (Low Dose CT Chest recommended if Age 53-80years, 30 pack-year currently  smoking OR have quit w/in 15years.) does qualify.   Lung Cancer Screening Referral: CT scan 02/27/2022  Additional Screening:  Hepatitis C Screening: does qualify; Completed 06/08/2021  Vision Screening: Recommended annual ophthalmology exams for early detection of glaucoma and other disorders of the eye. Is the patient up to date with their annual eye exam?  No  Who is the provider or what is the name of the office in which the patient attends annual eye exams? none If pt is not established with a provider, would they like to be referred to a provider to establish care? No .   Dental Screening: Recommended annual dental exams for proper oral hygiene  Community Resource Referral / Chronic Care Management: CRR required this visit?  No   CCM required this visit?  No      Plan:     I have personally reviewed and noted the following in the patient's chart:   Medical and social history Use of alcohol, tobacco or illicit drugs  Current medications and supplements including opioid prescriptions. Patient is not currently taking opioid prescriptions. Functional ability and status Nutritional status Physical activity Advanced directives List of other physicians Hospitalizations, surgeries, and  ER visits in previous 12 months Vitals Screenings to include cognitive, depression, and falls Referrals and appointments  In addition, I have reviewed and discussed with patient certain preventive protocols, quality metrics, and best practice recommendations. A written personalized care plan for preventive services as well as general preventive health recommendations were provided to patient.     Kellie Simmering, LPN   [id number]   Nurse Notes: none  Due to this being a virtual visit, the after visit summary with patients personalized plan was offered to patient via mail or my-chart.  Patient would like to access on my-chart

## 2022-12-06 ENCOUNTER — Telehealth: Payer: Self-pay | Admitting: Family Medicine

## 2022-12-06 NOTE — Telephone Encounter (Signed)
Pt dropped of form for insurance company for chronic condition put in Dr. Lanice Shirts folder

## 2022-12-13 NOTE — Telephone Encounter (Signed)
Form completed & faxed to [facility], sent to be scanned in chart

## 2022-12-15 ENCOUNTER — Other Ambulatory Visit: Payer: Self-pay | Admitting: Cardiovascular Disease

## 2022-12-26 ENCOUNTER — Telehealth: Payer: Self-pay | Admitting: Family Medicine

## 2022-12-26 NOTE — Telephone Encounter (Signed)
Estill Bamberg is Step Daughter, she is calling concerned with patients mental health.  He is beginning to show signs of Paranoia.  He is concerned with Armagedon and end times.  He has started buying up canned food and water.  He is concerned with all the wars.  His facebook is down today and he said that this is just the beginning.  That soon we will have no electricity.  He is hearing God talk to him.  He told his room mate not to tell anyone because they will think he is crazy.  He is having visions about the end of the world and day of darkness.  Please advise.

## 2022-12-26 NOTE — Telephone Encounter (Signed)
Brandon [name] will take him to ER if he will go.

## 2023-01-11 ENCOUNTER — Other Ambulatory Visit: Payer: Self-pay | Admitting: Internal Medicine

## 2023-02-17 ENCOUNTER — Other Ambulatory Visit: Payer: Self-pay | Admitting: Cardiovascular Disease

## 2023-03-15 ENCOUNTER — Other Ambulatory Visit: Payer: Self-pay | Admitting: Cardiovascular Disease

## 2023-06-02 ENCOUNTER — Other Ambulatory Visit: Payer: Self-pay

## 2023-06-02 ENCOUNTER — Emergency Department (HOSPITAL_COMMUNITY)
Admission: EM | Admit: 2023-06-02 | Discharge: 2023-06-02 | Disposition: A | Discharge status: Home / Self Care | Payer: Medicare PPO | Attending: Emergency Medicine | Admitting: Emergency Medicine

## 2023-06-02 ENCOUNTER — Emergency Department (HOSPITAL_COMMUNITY): Payer: Medicare PPO

## 2023-06-02 ENCOUNTER — Encounter (HOSPITAL_COMMUNITY): Payer: Self-pay

## 2023-06-02 DIAGNOSIS — I10 Essential (primary) hypertension: Secondary | ICD-10-CM | POA: Diagnosis not present

## 2023-06-02 DIAGNOSIS — R079 Chest pain, unspecified: Secondary | ICD-10-CM | POA: Diagnosis not present

## 2023-06-02 DIAGNOSIS — Z79899 Other long term (current) drug therapy: Secondary | ICD-10-CM | POA: Insufficient documentation

## 2023-06-02 DIAGNOSIS — F17200 Nicotine dependence, unspecified, uncomplicated: Secondary | ICD-10-CM | POA: Insufficient documentation

## 2023-06-02 DIAGNOSIS — I2510 Atherosclerotic heart disease of native coronary artery without angina pectoris: Secondary | ICD-10-CM | POA: Diagnosis not present

## 2023-06-02 DIAGNOSIS — Z7982 Long term (current) use of aspirin: Secondary | ICD-10-CM | POA: Insufficient documentation

## 2023-06-02 DIAGNOSIS — R0789 Other chest pain: Secondary | ICD-10-CM | POA: Diagnosis not present

## 2023-06-02 LAB — BASIC METABOLIC PANEL
Anion gap: 8 (ref 5–15)
BUN: 14 mg/dL (ref 6–20)
CO2: 24 mmol/L (ref 22–32)
Calcium: 9.3 mg/dL (ref 8.9–10.3)
Chloride: 107 mmol/L (ref 98–111)
Creatinine, Ser: 0.86 mg/dL (ref 0.61–1.24)
GFR, Estimated: >60 mL/min (ref >60)
Glucose, Bld: 96 mg/dL (ref 70–99)
Potassium: 4.1 mmol/L (ref 3.5–5.1)
Sodium: 139 mmol/L (ref 135–145)

## 2023-06-02 LAB — CBC
HCT: 49.1 % (ref 39.0–52.0)
Hemoglobin: 16.9 g/dL (ref 13.0–17.0)
MCH: 31.4 pg (ref 26.0–34.0)
MCHC: 34.4 g/dL (ref 30.0–36.0)
MCV: 91.1 fL (ref 80.0–100.0)
Platelets: 157 10*3/uL (ref 150–400)
RBC: 5.39 MIL/uL (ref 4.22–5.81)
RDW: 13.4 % (ref 11.5–15.5)
WBC: 8.1 10*3/uL (ref 4.0–10.5)
nRBC: 0 % (ref 0.0–0.2)

## 2023-06-02 LAB — TROPONIN I (HIGH SENSITIVITY)
Troponin I (High Sensitivity): 5 ng/L (ref <18)
Troponin I (High Sensitivity): 5 ng/L (ref <18)

## 2023-06-02 MED ORDER — NITROGLYCERIN 0.4 MG SL SUBL
0.4000 mg | SUBLINGUAL_TABLET | Freq: Once | SUBLINGUAL | Status: AC
  Administered 2023-06-02: 0.4 mg via SUBLINGUAL
  Filled 2023-06-02: qty 1

## 2023-06-02 MED ORDER — NAPROXEN 500 MG PO TABS
500.0000 mg | ORAL_TABLET | Freq: Two times a day (BID) | ORAL | 0 refills | Status: DC
Start: 2023-06-02 — End: 2023-10-25

## 2023-06-02 MED ORDER — ACETAMINOPHEN 325 MG PO TABS
650.0000 mg | ORAL_TABLET | Freq: Once | ORAL | Status: DC
  Filled 2023-06-02: qty 2

## 2023-06-02 MED ORDER — LIDOCAINE 5 % EX PTCH
1.0000 | MEDICATED_PATCH | CUTANEOUS | Status: DC
  Administered 2023-06-02: 1 via TRANSDERMAL
  Filled 2023-06-02: qty 1

## 2023-06-02 MED ORDER — KETOROLAC TROMETHAMINE 15 MG/ML IJ SOLN
15.0000 mg | Freq: Once | INTRAMUSCULAR | Status: AC
  Administered 2023-06-02: 15 mg via INTRAVENOUS
  Filled 2023-06-02: qty 1

## 2023-06-02 MED ORDER — ASPIRIN 81 MG PO CHEW
324.0000 mg | CHEWABLE_TABLET | Freq: Once | ORAL | Status: AC
  Administered 2023-06-02: 324 mg via ORAL
  Filled 2023-06-02: qty 4

## 2023-06-02 MED ORDER — MORPHINE SULFATE (PF) 4 MG/ML IV SOLN: 4.0000 mg | Freq: Once | INTRAVENOUS | Status: DC

## 2023-06-02 MED ORDER — NITROGLYCERIN 0.4 MG SL SUBL: 0.4000 mg | SUBLINGUAL_TABLET | SUBLINGUAL | 0 refills | Status: DC | PRN

## 2023-06-02 MED ORDER — ASPIRIN 325 MG PO TABS: 325.0000 mg | ORAL_TABLET | Freq: Once | ORAL | Status: DC

## 2023-06-02 NOTE — ED Provider Notes (Signed)
[place] EMERGENCY DEPARTMENT AT [facility] Provider Note   CSN: [id number] Arrival date & time: 06/02/23  4098     History {Add pertinent medical, surgical, social history, OB history to HPI:1} Chief Complaint  Patient presents with   Chest Pain    Brandon [name] is a 52 y.o. male.  History of CAD, hypertension, hyperlipidemia, is a smoker.  Presents the ER today complaining of chest pain that started around 9 PM last night, has been relatively constant with no exacerbating alleviating factors.  States pain was worse this morning, is about a 6 out of 10 on the pain scale, he was worried because he has had similar pain with MI in the past.  Denies radiation of pain, no back pain. He states he is not having any sweating nausea or dizziness but she has had with prior MIs.  Has been compliant with his occasions, has lost weight but does continue to smoke though he has cut back.  Reports she has had 3 MIs in the past, first 1 was at age 21   Chest Pain      Home Medications Prior to Admission medications   Medication Sig Start Date End Date Taking? Authorizing Provider  acetaminophen (TYLENOL) 500 MG tablet Take 1,000 mg by mouth every 6 (six) hours as needed for mild pain or moderate pain.    [provider]  albuterol (VENTOLIN HFA) 108 (90 Base) MCG/ACT inhaler Inhale 2 puffs into the lungs every 6 (six) hours as needed. Patient not taking: Reported on 07/24/2022 03/02/20   Tomma Lightning, MD  aspirin EC 81 MG tablet Take 1 tablet (81 mg total) by mouth daily. Swallow whole. 05/31/21   Runell Gess, MD  atorvastatin (LIPITOR) 80 MG tablet Take 1 tablet by mouth once daily 07/04/22   Runell Gess, MD  clopidogrel (PLAVIX) 75 MG tablet Take 1 tablet by mouth once daily 03/15/23   Runell Gess, MD  Evolocumab (REPATHA SURECLICK) 140 MG/ML SOAJ INJECT 1 PEN SUBCUTANEOUSLY EVERY 14 DAYS 02/19/23   Runell Gess, MD  ezetimibe (ZETIA) 10 MG tablet  Take 1 tablet (10 mg total) by mouth daily. 07/24/22   Ronnald Nian, MD  metoprolol succinate (TOPROL-XL) 50 MG 24 hr tablet Take 1 tablet (50 mg total) by mouth daily. Take with or immediately following a meal. 07/24/22   Ronnald Nian, MD      Allergies    Patient has no known allergies.    Review of Systems   Review of Systems  Cardiovascular:  Positive for chest pain.    Physical Exam Updated Vital Signs BP (!) 134/92   Pulse 66   Temp 97.8 F (36.6 C) (Oral)   Resp 17   Ht 5\' 9"  (1.753 m)   Wt 117.9 kg   SpO2 96%   BMI 38.40 kg/m  Physical Exam Vitals and nursing note reviewed.  Constitutional:      General: He is not in acute distress.    Appearance: He is well-developed.  HENT:     Head: Normocephalic and atraumatic.  Eyes:     Extraocular Movements: Extraocular movements intact.     Conjunctiva/sclera: Conjunctivae normal.     Pupils: Pupils are equal, round, and reactive to light.  Cardiovascular:     Rate and Rhythm: Normal rate and regular rhythm.     Heart sounds: No murmur heard. Pulmonary:     Effort: Pulmonary effort is normal. No respiratory distress.  Breath sounds: Normal breath sounds.  Chest:     Chest wall: No tenderness or crepitus.  Abdominal:     Palpations: Abdomen is soft.     Tenderness: There is abdominal tenderness.     Comments: Tenderness that reproduces pain right pectoralis muscle  Musculoskeletal:        General: No swelling. Normal range of motion.     Cervical back: Neck supple.  Skin:    General: Skin is warm and dry.     Capillary Refill: Capillary refill takes less than 2 seconds.  Neurological:     Mental Status: He is alert.  Psychiatric:        Mood and Affect: Mood normal.     ED Results / Procedures / Treatments   Labs (all labs ordered are listed, but only abnormal results are displayed) Labs Reviewed  BASIC METABOLIC PANEL  CBC  TROPONIN I (HIGH SENSITIVITY)    EKG EKG  Interpretation Date/Time:  Saturday June 02 2023 09:29:49 EDT Ventricular Rate:  66 PR Interval:  159 QRS Duration:  104 QT Interval:  414 QTC Calculation: 434 R Axis:   17  Text Interpretation: Sinus rhythm Low voltage, extremity leads Abnormal inferior Q waves ST elev, probable normal early repol pattern inferior ST changes more prominant than prior  4/21 Confirmed by Meridee Score [phone]) on 06/02/2023 9:37:03 AM  Radiology No results found.  Procedures Procedures  {Document cardiac monitor, telemetry assessment procedure when appropriate:1}  Medications Ordered in ED Medications  aspirin chewable tablet 324 mg (has no administration in time range)  nitroGLYCERIN (NITROSTAT) SL tablet 0.4 mg (has no administration in time range)    ED Course/ Medical Decision Making/ A&P   {   Click here for ABCD2, HEART and other calculatorsREFRESH Note before signing :1}                              Medical Decision Making Amount and/or Complexity of Data Reviewed External Data Reviewed: labs, radiology and notes. Labs: ordered.    Details: Troponin delta of 0, normal cbc and bmp Radiology: ordered.  Risk OTC drugs. Prescription drug management.   ***  {Document critical care time when appropriate:1} {Document review of labs and clinical decision tools ie heart score, Chads2Vasc2 etc:1}  {Document your independent review of radiology images, and any outside records:1} {Document your discussion with family members, caretakers, and with consultants:1} {Document social determinants of health affecting pt's care:1} {Document your decision making why or why not admission, treatments were needed:1} Final Clinical Impression(s) / ED Diagnoses Final diagnoses:  None    Rx / DC Orders ED Discharge Orders     None

## 2023-06-02 NOTE — ED Triage Notes (Signed)
Pt was bib his son today for chest pain. Pt states the pain started in the middle of his chest and is now more towards his right side of chest, pt states he has felt this pain before when he had an MI. Pain started last night around 9 pm, pt states he went to bed and woke up this morning and the pain was worse. Pt has not taken any medication for this pain.

## 2023-06-02 NOTE — ED Notes (Signed)
Pt has hx of heart attacks and strokes.

## 2023-06-02 NOTE — Discharge Instructions (Signed)
Pleasure taking care of you today.  Your chest pain seems to be related to muscle strain from overexertion yesterday.  You are given prescription for naproxen.  Your troponins were normal, EKG was reassuring, chest x-ray was normal.  As you requested a prescription has also been sent in for nitroglycerin.  If you are having severe pain, nausea, sweating or dizziness you need to come back to the ER.  Is important to follow-up with your cardiologist even though your testing today was reassuring.

## 2023-06-07 ENCOUNTER — Other Ambulatory Visit: Payer: Self-pay

## 2023-06-07 ENCOUNTER — Emergency Department (HOSPITAL_COMMUNITY)
Admission: EM | Admit: 2023-06-07 | Discharge: 2023-06-07 | Disposition: A | Discharge status: Home / Self Care | Payer: Medicare PPO | Attending: Emergency Medicine | Admitting: Emergency Medicine

## 2023-06-07 DIAGNOSIS — L03317 Cellulitis of buttock: Secondary | ICD-10-CM | POA: Diagnosis not present

## 2023-06-07 DIAGNOSIS — Z7982 Long term (current) use of aspirin: Secondary | ICD-10-CM | POA: Insufficient documentation

## 2023-06-07 DIAGNOSIS — L0231 Cutaneous abscess of buttock: Secondary | ICD-10-CM | POA: Diagnosis not present

## 2023-06-07 MED ORDER — SULFAMETHOXAZOLE-TRIMETHOPRIM 800-160 MG PO TABS: 1.0000 | ORAL_TABLET | Freq: Two times a day (BID) | ORAL | 0 refills | Status: DC

## 2023-06-07 MED ORDER — AMOXICILLIN-POT CLAVULANATE 875-125 MG PO TABS: 1.0000 | ORAL_TABLET | Freq: Two times a day (BID) | ORAL | 0 refills | Status: DC

## 2023-06-07 NOTE — ED Provider Notes (Addendum)
[facility] AT [facility] Provider Note   CSN: [id number] Arrival date & time: 06/07/23  4098     History  No chief complaint on file.   Brandon [name] is a 52 y.o. male.  HPI 52 year old male presents with a buttocks infection.  He was here 5 days ago for chest pain.  He states that is a lot better but now is worried because when he left the ER he noticed a boil starting on his left buttocks.  Otherwise, he popped it with minimal drainage.  However seem to be getting progressively worse and then last night opened up and drained.  He has been doing some baths with it.  He has not noticed any fevers or chills.  He states there was a large amount of foul-smelling drainage.  No rectal pain.  Home Medications Prior to Admission medications   Medication Sig Start Date End Date Taking? Authorizing Provider  amoxicillin-clavulanate (AUGMENTIN) 875-125 MG tablet Take 1 tablet by mouth every 12 (twelve) hours. 06/07/23  Yes Pricilla Loveless, MD  acetaminophen (TYLENOL) 500 MG tablet Take 1,000 mg by mouth every 6 (six) hours as needed for mild pain or moderate pain.    [provider]  albuterol (VENTOLIN HFA) 108 (90 Base) MCG/ACT inhaler Inhale 2 puffs into the lungs every 6 (six) hours as needed. Patient not taking: Reported on 07/24/2022 03/02/20   Tomma Lightning, MD  aspirin EC 81 MG tablet Take 1 tablet (81 mg total) by mouth daily. Swallow whole. 05/31/21   Runell Gess, MD  atorvastatin (LIPITOR) 80 MG tablet Take 1 tablet by mouth once daily 07/04/22   Runell Gess, MD  clopidogrel (PLAVIX) 75 MG tablet Take 1 tablet by mouth once daily 03/15/23   Runell Gess, MD  Evolocumab (REPATHA SURECLICK) 140 MG/ML SOAJ INJECT 1 PEN SUBCUTANEOUSLY EVERY 14 DAYS 02/19/23   Runell Gess, MD  ezetimibe (ZETIA) 10 MG tablet Take 1 tablet (10 mg total) by mouth daily. 07/24/22   Ronnald Nian, MD  metoprolol succinate (TOPROL-XL) 50 MG 24 hr  tablet Take 1 tablet (50 mg total) by mouth daily. Take with or immediately following a meal. 07/24/22   Ronnald Nian, MD  naproxen (NAPROSYN) 500 MG tablet Take 1 tablet (500 mg total) by mouth 2 (two) times daily. 06/02/23   Carmel Sacramento A, PA-C  nitroGLYCERIN (NITROSTAT) 0.4 MG SL tablet Place 1 tablet (0.4 mg total) under the tongue every 5 (five) minutes as needed for chest pain. 06/02/23   Carmel Sacramento A, PA-C      Allergies    Patient has no known allergies.    Review of Systems   Review of Systems  Constitutional:  Negative for chills and fever.  Skin:  Positive for color change.    Physical Exam Updated Vital Signs BP 115/73   Pulse 88   Temp 98.2 F (36.8 C) (Oral)   Resp 18   SpO2 91%  Physical Exam Vitals and nursing note reviewed.  Constitutional:      Appearance: He is well-developed.  HENT:     Head: Normocephalic and atraumatic.  Pulmonary:     Effort: Pulmonary effort is normal.  Abdominal:     General: There is no distension.  Genitourinary:   Skin:    General: Skin is warm and dry.  Neurological:     Mental Status: He is alert.     ED Results / Procedures / Treatments  Labs (all labs ordered are listed, but only abnormal results are displayed) Labs Reviewed - No data to display  EKG None  Radiology No results found.  Procedures Ultrasound ED Soft Tissue  Date/Time: 06/07/2023 7:25 AM  Performed by: Pricilla Loveless, MD Authorized by: Pricilla Loveless, MD   Procedure details:    Indications: localization of abscess and evaluate for cellulitis     Transverse view:  Visualized   Longitudinal view:  Visualized   Images: archived   Location:    Location: buttocks     Side:  Left Findings:     cellulitis present Comments:     Cobblestoning present.  Small amount of fluid very superficially.     Medications Ordered in ED Medications - No data to display  ED Course/ Medical Decision Making/ A&P                                  Medical Decision Making Risk Prescription drug management.   Patient's vital signs are normal.  He is afebrile.  I have very low suspicion for deep space infection based on history, exam, and ultrasound as well as very low suspicion for sepsis/systemic infection.  On ultrasound there is probably a very small amount of fluid and I discussed we could try to open this up but it seems like most of it has drained and there appears to be a small area where it can continue to drain.  At this point it appears to be mostly cellulitis.  Will treat with Augmentin given location of abscess/cellulitis.  Otherwise, continue supportive care with soaks and baths.  Will have him follow-up with PCP and/or return here if symptoms worsen.  No rectal pain, low concern for perirectal inflammation/abscess.        Final Clinical Impression(s) / ED Diagnoses Final diagnoses:  Cellulitis and abscess of buttock    Rx / DC Orders ED Discharge Orders          Ordered    sulfamethoxazole-trimethoprim (BACTRIM DS) 800-160 MG tablet  2 times daily,   Status:  Discontinued        06/07/23 0718    amoxicillin-clavulanate (AUGMENTIN) 875-125 MG tablet  Every 12 hours        06/07/23 0732             Pricilla Loveless, MD 06/07/23 [phone]

## 2023-06-07 NOTE — Discharge Instructions (Addendum)
Continue to use warm soaks and/or baths multiple times per day to help with the infection.  We are also prescribing you antibiotics to start and take.   Follow-up with your primary care physician for a wound recheck.  If the area is not improving over the next 48 hours, worsens at any time, you develop new or worsening pain, fever, chills, or any other new/concerning symptoms then return to the ER or call 911.

## 2023-06-07 NOTE — ED Triage Notes (Signed)
Pt has abscess at buttocks area. Pt states that it popped yesterday and he is concerned that it is infected. Pt states that area is leaking brown discharge and is red.

## 2023-06-08 ENCOUNTER — Telehealth: Payer: Self-pay

## 2023-06-08 NOTE — Telephone Encounter (Signed)
Transition Care Management Unsuccessful Follow-up Telephone Call  Date of discharge and from where:  [name] 8/10  Attempts:  1st Attempt  Reason for unsuccessful TCM follow-up call:  No answer/busy

## 2023-06-08 NOTE — Telephone Encounter (Signed)
Runell Gess, MD  Terrilee Files, MD; Bernita Buffy, RN Carma Lair, pt was in the ER with CP and sent home. Prob MS. Needs F/U with an APP in the next few weeks  Spoke with pt regarding needing an appointment with APP after ED visit. Pt states that he does not feel like his issues have been cardiac related and feels like he needs to see his PCP. He will let us know if he needs to be seen sooner than October when he has a follow up visit with Dr. Allyson Sabal.

## 2023-06-08 NOTE — Telephone Encounter (Signed)
Transition Care Management Follow-up Telephone Call Date of discharge and from where: [facility] [name] 8/10 How have you been since you were released from the hospital? Doing ok but still in a lot of pain on his right side. PT went back to the ED 2 days later. Pt is trying to get an appointment with PCP but can't an appointment  Any questions or concerns? No  Items Reviewed: Did the pt receive and understand the discharge instructions provided? Yes  Medications obtained and verified? Yes  Other? No  Any new allergies since your discharge? No  Dietary orders reviewed? No Do you have support at home? Yes     Follow up appointments reviewed:  PCP Hospital f/u appt confirmed? No  Scheduled to see  on  @ . Specialist Hospital f/u appt confirmed? No  Scheduled to see  on  @ . Are transportation arrangements needed? Yes  If their condition worsens, is the pt aware to call PCP or go to the Emergency Dept.? Yes Was the patient provided with contact information for the PCP's office or ED? Yes Was to pt encouraged to call back with questions or concerns? Yes

## 2023-06-14 ENCOUNTER — Telehealth: Payer: Self-pay

## 2023-06-14 NOTE — Telephone Encounter (Signed)
Transition Care Management Unsuccessful Follow-up Telephone Call  Date of discharge and from where:  06/07/2023 [facility]  Attempts:  1st Attempt  Reason for unsuccessful TCM follow-up call:  Left voice message  Mehdi Gironda [facility]  [facility] Population Health Community Resource Care Guide   [email or web address]  ?? [phone]   Website: [email or web address]  [email or web address]

## 2023-06-14 NOTE — Telephone Encounter (Signed)
Transition Care Management Unsuccessful Follow-up Telephone Call  Date of discharge and from where:  06/07/2023 [facility]  Attempts:  2nd Attempt  Reason for unsuccessful TCM follow-up call:  Unable to leave message  Jauna Raczynski [facility]  [facility] Population Health Community Resource Care Guide   [email or web address]  ?? [phone]   Website: [email or web address]  [email or web address]

## 2023-06-30 ENCOUNTER — Other Ambulatory Visit: Payer: Self-pay | Admitting: Cardiovascular Disease

## 2023-06-30 DIAGNOSIS — E785 Hyperlipidemia, unspecified: Secondary | ICD-10-CM

## 2023-07-18 ENCOUNTER — Other Ambulatory Visit: Payer: Self-pay | Admitting: Family Medicine

## 2023-07-26 ENCOUNTER — Other Ambulatory Visit: Payer: Self-pay | Admitting: Family Medicine

## 2023-07-26 ENCOUNTER — Ambulatory Visit: Payer: Medicare PPO | Admitting: Family Medicine

## 2023-07-26 DIAGNOSIS — E782 Mixed hyperlipidemia: Secondary | ICD-10-CM

## 2023-07-30 ENCOUNTER — Ambulatory Visit: Payer: Medicare PPO | Attending: Cardiovascular Disease | Admitting: Cardiovascular Disease

## 2023-07-30 ENCOUNTER — Encounter: Payer: Self-pay | Admitting: Cardiovascular Disease

## 2023-07-30 VITALS — BP 132/78 | HR 66 | Ht 69.0 in | Wt 268.8 lb

## 2023-07-30 DIAGNOSIS — I6522 Occlusion and stenosis of left carotid artery: Secondary | ICD-10-CM

## 2023-07-30 DIAGNOSIS — Z720 Tobacco use: Secondary | ICD-10-CM | POA: Diagnosis not present

## 2023-07-30 DIAGNOSIS — E782 Mixed hyperlipidemia: Secondary | ICD-10-CM

## 2023-07-30 DIAGNOSIS — I2510 Atherosclerotic heart disease of native coronary artery without angina pectoris: Secondary | ICD-10-CM | POA: Insufficient documentation

## 2023-07-30 NOTE — Assessment & Plan Note (Addendum)
History of hyperlipidemia on Repatha and, high-dose atorvastatin, and Zetia with lipid profile performed/19/23 revealing total cholesterol 140, LDL 66 and HDL 42.

## 2023-07-30 NOTE — Assessment & Plan Note (Signed)
Ongoing tobacco abuse of at least a half a pack a day down from a pack and a half a day.

## 2023-07-30 NOTE — Assessment & Plan Note (Signed)
History of CAD status post inferior wall myocardial infarction in 2006 treated with stenting of his RCA.  He was recapped and found to have "in-stent restenosis which was intervened on.  I performed cardiac catheterization on him 02/24/2010 in the setting of non-STEMI revealing a new 75% in-stent restenosis within the RCA stent as well as a new 99% proximal lesion both of which were intervened on.  He has not had a coronary procedure since that time and denies anginal chest pain.

## 2023-07-30 NOTE — Assessment & Plan Note (Signed)
History of morbid obesity with a BMI of approximately 40.  He has lost over 30 pounds in the last several years as a result of diet.

## 2023-07-30 NOTE — Progress Notes (Signed)
07/30/2023 Brandon [name]   04/19/1971  [id number]  Primary Physician Ronnald Nian, MD Primary Cardiologist: Runell Gess MD Nicholes Calamity, [place]  HPI:  Brandon [name] is a 52 y.o.  severely overweight married Caucasian male father of 2 who currently is not working because of disability.  He was initially referred by Dr. Susann Givens because of lower extremity edema which has since resolved after changing jobs.  I last saw him in the office 05/31/2021.Marland Kitchen His risk factors include 1-1/2 packs a day of tobacco abuse having smoked 35 years now smoking 1/2 pack/day, as well as treated hypertension and hyperlipidemia. His father had a heart attack at age 46 and died. 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 He was recathed here later found to have in-stent restenosis as well as recently intervened on. I Him 02/24/10 the setting of a non-STEMI revealed revealing a new 75% in-stent restenosis within the RCA stent and a new 99% proximal lesion both of which were intervened on. He's had no problems since.   I have not seen him for close to 2 years.  He did run out of his statin drug for 6 months and had a lipid profile done a month ago revealing total cholesterol 276, LDL 194 and HDL of 37.  He apparently has been put back on a statin drug.  He denies chest pain or shortness of breath.  He does continue to smoke a pack a day however.   Since I saw him 2 years ago he has remained stable.  He is currently smoking 1/2 pack/day.  He denies chest pain or shortness of breath.  He has lost 20 to 30 pounds as a result of diet.  He mows his lawn which is acre and a half without symptoms.   Current Meds  Medication Sig   acetaminophen (TYLENOL) 500 MG tablet Take 1,000 mg by mouth every 6 (six) hours as needed for mild pain or moderate pain.   albuterol (VENTOLIN HFA) 108 (90 Base) MCG/ACT inhaler Inhale 2 puffs into the lungs every  6 (six) hours as needed.   amoxicillin-clavulanate (AUGMENTIN) 875-125 MG tablet Take 1 tablet by mouth every 12 (twelve) hours.   aspirin EC 81 MG tablet Take 1 tablet (81 mg total) by mouth daily. Swallow whole.   atorvastatin (LIPITOR) 80 MG tablet Take 1 tablet by mouth once daily   clopidogrel (PLAVIX) 75 MG tablet Take 1 tablet by mouth once daily   Evolocumab (REPATHA SURECLICK) 140 MG/ML SOAJ INJECT 1 PEN SUBCUTANEOUSLY EVERY 14 DAYS   ezetimibe (ZETIA) 10 MG tablet Take 1 tablet by mouth once daily   metoprolol succinate (TOPROL-XL) 50 MG 24 hr tablet TAKE 1 TABLET BY MOUTH ONCE DAILY WITH OR IMMEDIATELY FOLLOWING A MEAL   naproxen (NAPROSYN) 500 MG tablet Take 1 tablet (500 mg total) by mouth 2 (two) times daily.   nitroGLYCERIN (NITROSTAT) 0.4 MG SL tablet Place 1 tablet (0.4 mg total) under the tongue every 5 (five) minutes as needed for chest pain.     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  Tobacco Use   Smoking status: Every Day    Current packs/day: 1.00    Average packs/day: 1 pack/day for 34.0 years (34.0 ttl pk-yrs)    Types: Cigarettes   Smokeless tobacco: Never  Vaping Use   Vaping status: Never Used  Substance and Sexual Activity   Alcohol use: No    Comment:  Alcoholism- quit 2002   Drug use: No   Sexual activity: Not Currently  Other Topics Concern   Not on file  Social History Narrative            Social Determinants of Health   Financial Resource Strain: Low Risk  (10/20/2022)   Overall Financial Resource Strain (CARDIA)    Difficulty of Paying Living Expenses: Not hard at all  Food Insecurity: No Food Insecurity (10/20/2022)   Hunger Vital Sign    Worried About  Running Out of Food in the Last Year: Never true    Ran Out of Food in the Last Year: Never true  Transportation Needs: No Transportation Needs (10/20/2022)   PRAPARE - Administrator, Civil Service (Medical): No    Lack of Transportation (Non-Medical): No  Physical Activity: Inactive (10/20/2022)   Exercise Vital Sign    Days of Exercise per Week: 0 days    Minutes of Exercise per Session: 0 min  Stress: No Stress Concern Present (10/20/2022)   [employer] of Occupational Health - Occupational Stress Questionnaire    Feeling of Stress : Not at all  Social Connections: Not on file  Intimate Partner Violence: Not on file     Review of Systems: General: negative for chills, fever, night sweats or weight changes.  Cardiovascular: negative for chest pain, dyspnea on exertion, edema, orthopnea, palpitations, paroxysmal nocturnal dyspnea or shortness of breath Dermatological: negative for rash Respiratory: negative for cough or wheezing Urologic: negative for hematuria Abdominal: negative for nausea, vomiting, diarrhea, bright red blood per rectum, melena, or hematemesis Neurologic: negative for visual changes, syncope, or dizziness All other systems reviewed and are otherwise negative except as noted above.    Blood pressure 132/78, pulse 66, height 5\' 9"  (1.753 m), weight 268 lb 12.8 oz (121.9 kg), SpO2 96%.  General appearance: alert and no distress Neck: no adenopathy, no carotid bruit, no JVD, supple, symmetrical, trachea midline, and thyroid not enlarged, symmetric, no tenderness/mass/nodules Lungs: clear to auscultation bilaterally Heart: regular rate and rhythm, S1, S2 normal, no murmur, click, rub or gallop Extremities: extremities normal, atraumatic, no cyanosis or edema Pulses: 2+ and symmetric Skin: Skin color, texture, turgor normal. No rashes or lesions Neurologic: Grossly normal  EKG not performed today      ASSESSMENT AND PLAN:    Hyperlipidemia History of hyperlipidemia on Repatha and, high-dose atorvastatin, and Zetia with lipid profile performed/19/23 revealing total cholesterol 140, LDL 66 and HDL 42.  Hypertension History of essential hypertension blood pressure measured today at 132/78.  He is on metoprolol.  Morbid obesity (HCC) History of morbid obesity with a BMI of approximately 40.  He has lost over 30 pounds in the last several years as a result of diet.  Tobacco abuse Ongoing tobacco abuse of at least a half a pack a day down from a pack and a half a day.  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  Will we will repeat carotid Doppler studies.  Coronary artery disease History of CAD  status post inferior wall myocardial infarction in 2006 treated with stenting of his RCA.  He was recapped and found to have "in-stent restenosis which was intervened on.  I performed cardiac catheterization on him 02/24/2010 in the setting of non-STEMI revealing a new 75% in-stent restenosis within the RCA stent as well as a new 99% proximal lesion both of which were intervened on.  He has not had a coronary procedure since that time and denies anginal chest pain.     Runell Gess MD FACP,FACC,FAHA, [facility] 07/30/2023 1:57 PM

## 2023-07-30 NOTE — Assessment & Plan Note (Signed)
History of carotid artery disease with known left ICA occlusion and only mild right ICA stenosis.  Will we will repeat carotid Doppler studies.

## 2023-07-30 NOTE — Patient Instructions (Signed)
Medication Instructions:  Your physician recommends that you continue on your current medications as directed. Please refer to the Current Medication list given to you today.  *If you need a refill on your cardiac medications before your next appointment, please call your pharmacy*   Testing/Procedures: Your physician has requested that you have a carotid duplex. This test is an ultrasound of the carotid arteries in your neck. It looks at blood flow through these arteries that supply the brain with blood. Allow one hour for this exam. There are no restrictions or special instructions. This will take place at [place], [address].   Follow-Up: At [facility], you and your health needs are our priority.  As part of our continuing mission to provide you with exceptional heart care, we have created designated Provider Care Teams.  These Care Teams include your primary Cardiologist (physician) and Advanced Practice Providers (APPs -  Physician Assistants and Nurse Practitioners) who all work together to provide you with the care you need, when you need it.  We recommend signing up for the patient portal called "MyChart".  Sign up information is provided on this After Visit Summary.  MyChart is used to connect with patients for Virtual Visits (Telemedicine).  Patients are able to view lab/test results, encounter notes, upcoming appointments, etc.  Non-urgent messages can be sent to your provider as well.   To learn more about what you can do with MyChart, go to [email or web address].    Your next appointment:   12 month(s)  Provider:   [name], MD

## 2023-07-30 NOTE — Assessment & Plan Note (Signed)
History of essential hypertension blood pressure measured today at 132/78.  He is on metoprolol.

## 2023-08-09 ENCOUNTER — Ambulatory Visit (HOSPITAL_COMMUNITY)
Admission: RE | Admit: 2023-08-09 | Discharge: 2023-08-09 | Disposition: A | Discharge status: Home / Self Care | Payer: Medicare PPO | Source: Ambulatory Visit | Attending: Cardiology | Admitting: Cardiology

## 2023-08-09 DIAGNOSIS — E782 Mixed hyperlipidemia: Secondary | ICD-10-CM

## 2023-08-09 DIAGNOSIS — I6522 Occlusion and stenosis of left carotid artery: Secondary | ICD-10-CM | POA: Diagnosis not present

## 2023-08-10 ENCOUNTER — Other Ambulatory Visit (HOSPITAL_COMMUNITY): Payer: Self-pay | Admitting: *Deleted

## 2023-08-10 DIAGNOSIS — I6522 Occlusion and stenosis of left carotid artery: Secondary | ICD-10-CM

## 2023-09-24 ENCOUNTER — Ambulatory Visit (INDEPENDENT_AMBULATORY_CARE_PROVIDER_SITE_OTHER): Payer: Medicare PPO | Admitting: Family Medicine

## 2023-09-24 ENCOUNTER — Encounter: Payer: Self-pay | Admitting: Family Medicine

## 2023-09-24 VITALS — BP 138/76 | HR 76 | Ht 70.5 in | Wt 278.4 lb

## 2023-09-24 DIAGNOSIS — G4730 Sleep apnea, unspecified: Secondary | ICD-10-CM

## 2023-09-24 DIAGNOSIS — I252 Old myocardial infarction: Secondary | ICD-10-CM

## 2023-09-24 DIAGNOSIS — Z23 Encounter for immunization: Secondary | ICD-10-CM

## 2023-09-24 DIAGNOSIS — I2510 Atherosclerotic heart disease of native coronary artery without angina pectoris: Secondary | ICD-10-CM | POA: Diagnosis not present

## 2023-09-24 DIAGNOSIS — D124 Benign neoplasm of descending colon: Secondary | ICD-10-CM

## 2023-09-24 DIAGNOSIS — Z7902 Long term (current) use of antithrombotics/antiplatelets: Secondary | ICD-10-CM | POA: Diagnosis not present

## 2023-09-24 DIAGNOSIS — E782 Mixed hyperlipidemia: Secondary | ICD-10-CM

## 2023-09-24 DIAGNOSIS — I700 Atherosclerosis of aorta: Secondary | ICD-10-CM

## 2023-09-24 DIAGNOSIS — Z0000 Encounter for general adult medical examination without abnormal findings: Secondary | ICD-10-CM | POA: Diagnosis not present

## 2023-09-24 DIAGNOSIS — G4733 Obstructive sleep apnea (adult) (pediatric): Secondary | ICD-10-CM

## 2023-09-24 DIAGNOSIS — J984 Other disorders of lung: Secondary | ICD-10-CM

## 2023-09-24 DIAGNOSIS — Z87442 Personal history of urinary calculi: Secondary | ICD-10-CM

## 2023-09-24 DIAGNOSIS — I6522 Occlusion and stenosis of left carotid artery: Secondary | ICD-10-CM

## 2023-09-24 DIAGNOSIS — Z720 Tobacco use: Secondary | ICD-10-CM

## 2023-09-24 MED ORDER — METOPROLOL SUCCINATE ER 50 MG PO TB24
50.0000 mg | ORAL_TABLET | Freq: Every day | ORAL | 3 refills | Status: DC
Start: 2023-09-24 — End: 2023-12-27

## 2023-09-24 NOTE — Patient Instructions (Signed)
  Mr. Millirons , Thank you for taking time to come for your Medicare Wellness Visit. I appreciate your ongoing commitment to your health goals. Please review the following plan we discussed and let me know if I can assist you in the future.   These are the goals we discussed:  Goals      Patient Stated     09/02/2021, wants to get under 200 pounds and quit smoking     Patient Stated     10/20/2022, wants to lose weight        This is a list of the screening recommended for you and due dates:  Health Maintenance  Topic Date Due   Zoster (Shingles) Vaccine (1 of 2) Never done   Screening for Lung Cancer  02/28/2023   Medicare Annual Wellness Visit  09/23/2024   DTaP/Tdap/Td vaccine (2 - Td or Tdap) 04/29/2030   Colon Cancer Screening  09/21/2030   Flu Shot  Completed   Hepatitis C Screening  Completed   HIV Screening  Completed   HPV Vaccine  Aged Out   COVID-19 Vaccine  Discontinued

## 2023-09-24 NOTE — Progress Notes (Signed)
Brandon [name] is a 52 y.o. male who presents for annual wellness visit and follow-up on chronic medical conditions.  He is followed regularly by cardiology for his underlying cardiac issues as well as his carotid stenosis.  He is disabled.  He has started walking and has lost roughly 20 pounds.  His medications were reviewed and he continues on Lipitor Zetia, Repatha, metoprolol.  He does have nitroglycerin in case he needs it but has not had any chest pain, shortness of breath.  Continues to smoke and is not interested in quitting.  He knows the risk.  He does have a history of colonic polyps and apparently is scheduled for repeat colonoscopy but is not sure when.  He does have OSA and has tried CPAP but lives not successful with that. Immunizations and Health Maintenance Immunization History  Administered Date(s) Administered   Influenza Whole 06/27/2011   Influenza,inj,Quad PF,6+ Mos 06/19/2017, 07/24/2022   Moderna Sars-Covid-2 Vaccination 10/28/2020   PFIZER(Purple Top)SARS-COV-2 Vaccination 01/15/2020, 02/07/2020   PNEUMOCOCCAL CONJUGATE-20 06/08/2021   Tdap 04/29/2020   Health Maintenance Due  Topic Date Due   Zoster Vaccines- Shingrix (1 of 2) Never done   Lung Cancer Screening  02/28/2023   INFLUENZA VACCINE  05/24/2023    Last colonoscopy: November 2021 Last PSA: Unknown  Dentist: no regular dental care  Ophtho: no regular care  Exercise: Walk for 2-3 hours a week  Other doctors caring for patient include: Dr. Nanetta Batty, cardiology  Advanced Directives: none ; info given    Depression screen:  See questionnaire below.        09/24/2023    1:33 PM 10/20/2022   10:02 AM 07/24/2022    1:23 PM 09/02/2021    3:49 PM 06/08/2021    3:18 PM  Depression screen PHQ 2/9  Decreased Interest 0 0 1 0 0  Down, Depressed, Hopeless 0 0 0 0 0  PHQ - 2 Score 0 0 1 0 0  Altered sleeping  0     Tired, decreased energy  0     Change in appetite  0     Feeling bad or failure  about yourself   0     Trouble concentrating  0     Moving slowly or fidgety/restless  0     Suicidal thoughts  0     PHQ-9 Score  0     Difficult doing work/chores  Not difficult at all       Fall Screen: See Questionaire below.      09/24/2023    1:33 PM 10/20/2022   10:02 AM 07/24/2022    1:22 PM 09/02/2021    3:49 PM 06/19/2017    9:58 AM  Fall Risk   Falls in the past year? 0 0 1 0 No  Number falls in past yr: 0 0 0    Injury with Fall? 0 0 0    Risk for fall due to :  Medication side effect No Fall Risks Medication side effect   Follow up  Falls prevention discussed;Education provided;Falls evaluation completed Falls evaluation completed Falls evaluation completed;Education provided;Falls prevention discussed     ADL screen:  See questionnaire below.  Functional Status Survey: Is the patient deaf or have difficulty hearing?: Yes Does the patient have difficulty seeing, even when wearing glasses/contacts?: No Does the patient have difficulty concentrating, remembering, or making decisions?: No Does the patient have difficulty walking or climbing stairs?: No Does the patient have difficulty dressing or bathing?:  No Does the patient have difficulty doing errands alone such as visiting a doctor's office or shopping?: No   Review of Systems  Constitutional: -, -unexpected weight change, -anorexia, -fatigue Allergy: -sneezing, -itching, -congestion Dermatology: denies changing moles, rash, lumps ENT: -runny nose, -ear pain, -sore throat,  Cardiology:  -chest pain, -palpitations, -orthopnea, Respiratory: -cough, -shortness of breath, -dyspnea on exertion, -wheezing,  Gastroenterology: -abdominal pain, -nausea, -vomiting, -diarrhea, -constipation, -dysphagia Hematology: -bleeding or bruising problems Musculoskeletal: -arthralgias, -myalgias, -joint swelling, -back pain, - Ophthalmology: -vision changes,  Urology: -dysuria, -difficulty urinating,  -urinary frequency,  -urgency, incontinence Neurology: -, -numbness, , -memory loss, -falls, -dizziness    PHYSICAL EXAM:  BP 138/76   Pulse 76   Ht 5' 10.5" (1.791 m)   Wt 278 lb 6.4 oz (126.3 kg)   SpO2 94%   BMI 39.38 kg/m   General Appearance: Alert, cooperative, no distress, appears stated age Head: Normocephalic, without obvious abnormality, atraumatic Eyes: PERRL, conjunctiva/corneas clear, EOM's intact, Ears: Normal TM's and external ear canals Nose: Nares normal, mucosa normal, no drainage or sinus   tenderness Throat: Lips, mucosa, and tongue normal; teeth and gums normal Neck: Supple, no lymphadenopathy, thyroid:no enlargement/tenderness/nodules; no carotid bruit or JVD Lungs: Clear to auscultation bilaterally without wheezes, rales or ronchi; respirations unlabored Heart: Regular rate and rhythm, S1 and S2 normal, no murmur, rub or gallop Abdomen: Soft, non-tender, nondistended, normoactive bowel sounds, no masses, no hepatosplenomegaly Skin: Skin color, texture, turgor normal, no rashes or lesions Lymph nodes: Cervical, supraclavicular, and axillary nodes normal Neurologic: CNII-XII intact, normal strength, sensation and gait; reflexes 2+ and symmetric throughout   Psych: Normal mood, affect, hygiene and grooming  ASSESSMENT/PLAN: Routine general medical examination at a health care facility - Plan: Comprehensive metabolic panel, Lipid panel  Adenomatous polyp of descending colon - Plan: Ambulatory referral to Gastroenterology  Antiplatelet or antithrombotic long-term use  Arteriosclerotic cardiovascular disease (ASCVD)  Atherosclerosis of aorta (HCC)  Coronary artery disease involving native coronary artery of native heart without angina pectoris - Plan: metoprolol succinate (TOPROL-XL) 50 MG 24 hr tablet  Morbid obesity (HCC)  OSA (obstructive sleep apnea)  Restrictive lung disease  Tobacco abuse  History of MI (myocardial infarction)  History of kidney stones  Mixed  hyperlipidemia - Plan: Lipid panel  Need for influenza vaccination - Plan: Flu vaccine trivalent PF, 6mos and older(Flulaval,Afluria,Fluarix,Fluzone)    Discussed PSA screening (risks/benefits), recommended at least 30 minutes of aerobic activity at least 5 days/week;  . Immunization recommendations discussed.  Colonoscopy recommendations reviewed.  Where that continued smoking is anything with his overall health.  Explained to him that I am ready to help whenever he is ready.  Otherwise he will continue on his present medication regimen and follow-up with Dr. Gery Pray.  Recheck here in 1 year.   Medicare Attestation I have personally reviewed: The patient's medical and social history Their use of alcohol, tobacco or illicit drugs Their current medications and supplements The patient's functional ability including ADLs,fall risks, home safety risks, cognitive, and hearing and visual impairment Diet and physical activities Evidence for depression or mood disorders  The patient's weight, height, and BMI have been recorded in the chart.  I have made referrals, counseling, and provided education to the patient based on review of the above and I have provided the patient with a written personalized care plan for preventive services.     Sharlot Gowda, MD   09/24/2023

## 2023-09-25 LAB — COMPREHENSIVE METABOLIC PANEL
ALT: 22 [IU]/L (ref 0–44)
AST: 19 [IU]/L (ref 0–40)
Albumin: 4.5 g/dL (ref 3.8–4.9)
Alkaline Phosphatase: 88 [IU]/L (ref 44–121)
BUN/Creatinine Ratio: 15 (ref 9–20)
BUN: 13 mg/dL (ref 6–24)
Bilirubin Total: 0.5 mg/dL (ref 0.0–1.2)
CO2: 23 mmol/L (ref 20–29)
Calcium: 9.5 mg/dL (ref 8.7–10.2)
Chloride: 102 mmol/L (ref 96–106)
Creatinine, Ser: 0.85 mg/dL (ref 0.76–1.27)
Globulin, Total: 2.4 g/dL (ref 1.5–4.5)
Glucose: 91 mg/dL (ref 70–99)
Potassium: 4.3 mmol/L (ref 3.5–5.2)
Sodium: 140 mmol/L (ref 134–144)
Total Protein: 6.9 g/dL (ref 6.0–8.5)
eGFR: 103 mL/min/{1.73_m2} (ref >59)

## 2023-09-25 LAB — LIPID PANEL
Chol/HDL Ratio: 2.9 {ratio} (ref 0.0–5.0)
Cholesterol, Total: 140 mg/dL (ref 100–199)
HDL: 48 mg/dL (ref >39)
LDL Chol Calc (NIH): 63 mg/dL (ref 0–99)
Triglycerides: 170 mg/dL — ABNORMAL HIGH (ref 0–149)
VLDL Cholesterol Cal: 29 mg/dL (ref 5–40)

## 2023-10-03 ENCOUNTER — Other Ambulatory Visit: Payer: Self-pay | Admitting: Cardiovascular Disease

## 2023-10-03 DIAGNOSIS — E785 Hyperlipidemia, unspecified: Secondary | ICD-10-CM

## 2023-10-11 ENCOUNTER — Telehealth: Payer: Self-pay | Admitting: Gastroenterology

## 2023-10-11 NOTE — Telephone Encounter (Signed)
Inbound call from patient, states he would like to schedule his overdue recall colonoscopy. Patient last had colonoscopy in 2021 at [facility].

## 2023-10-11 NOTE — Telephone Encounter (Signed)
Patient with cardiac history, on anticoagulants, difficult airway. Lets schedule him for an office visit with Dr Illene Silver.

## 2023-10-19 NOTE — Telephone Encounter (Signed)
Called patient to schedule office visit.

## 2023-10-25 ENCOUNTER — Ambulatory Visit (INDEPENDENT_AMBULATORY_CARE_PROVIDER_SITE_OTHER): Payer: Medicare PPO | Admitting: Gastroenterology

## 2023-10-25 ENCOUNTER — Encounter: Payer: Self-pay | Admitting: Gastroenterology

## 2023-10-25 VITALS — BP 138/84 | HR 82 | Ht 69.0 in | Wt 282.0 lb

## 2023-10-25 DIAGNOSIS — Z860101 Personal history of adenomatous and serrated colon polyps: Secondary | ICD-10-CM | POA: Diagnosis not present

## 2023-10-25 DIAGNOSIS — Z09 Encounter for follow-up examination after completed treatment for conditions other than malignant neoplasm: Secondary | ICD-10-CM

## 2023-10-25 DIAGNOSIS — Z7902 Long term (current) use of antithrombotics/antiplatelets: Secondary | ICD-10-CM | POA: Diagnosis not present

## 2023-10-25 DIAGNOSIS — Z860100 Personal history of colon polyps, unspecified: Secondary | ICD-10-CM | POA: Insufficient documentation

## 2023-10-25 MED ORDER — NA SULFATE-K SULFATE-MG SULF 17.5-3.13-1.6 GM/177ML PO SOLN: 1.0000 | Freq: Once | ORAL | 0 refills | Status: AC

## 2023-10-25 NOTE — Progress Notes (Signed)
 10/25/2023 Brandon [name] [id number] Aug 27, 1971   HISTORY OF PRESENT ILLNESS: This is a 53 year old male who is known to Dr. San for colonoscopy in November 2021 as listed below.  He had several polyps, some of them large and most of them adenomatous colon polyps.  We had recommended a repeat colonoscopy in a year.  He does not have any GI complaints.  He is on Plavix  for history of coronary artery disease, has been on that for quite some time.  Follows with Dr. Court or cardiology.  Colonoscopy 08/2020:  - Hemorrhoids found on perianal exam. - 11 3 to 8 mm polyps in the sigmoid colon and in the descending colon, removed with a cold snare. Resected and retrieved. - One 15 mm polyp in the sigmoid colon, removed with a hot snare. Resected and retrieved. - One 20 mm polyp in the rectum, removed with a hot snare. Resected and retrieved. Clip ( MR conditional) was placed. - Three 3 to 4 mm polyps in the rectum, removed with a cold snare. Resected and retrieved. - Non- bleeding internal hemorrhoids. - The examined portion of the ileum was normal.  A. COLON, DESCENDING AND SIGMOID, POLYPECTOMY:  -  Tubular adenoma (6 fragments)  -  Hyperplastic polyp (5 fragments)  -  Multiple fragments of benign colonic mucosa  -  No high grade dysplasia or malignancy identified   B. COLON, SIGMOID, POLYPECTOMY:  -  Tubular adenoma (2 of 4 fragments)  -  Hyperplastic polyp (2 of 4 fragments)  -  No high grade dysplasia or malignancy identified   C. RECTUM, POLYPECTOMY:  -  Multiple fragments of tubulovillous adenoma  -  No high grade dysplasia or malignancy identified   D. RECTUM, POLYPECTOMY:  -  Multiple fragments of tubular adenoma(s)  -  hyperplastic polyp (2 fragments)  -  No high grade dysplasia or malignancy identified      Past Medical History:  Diagnosis Date   Arteriosclerotic cardiovascular disease (ASCVD) 2006, 2012   2006-acute IMI treated with urgent RCA stent; 2007-Cutting  Balloon for in-stent restenosis; 02/2010-presented with ACS and minimal troponin elevation:70% LAD, 80% distal circumflex, 80% proximal ramus branch vessel,in-stent restenosis of 70% in the RCA; BMS for proximal critical RCA stenosis, restenosis Nov 2012   Carotid artery occlusion    Heart attack (HCC)    History of noncompliance with medical treatment    Due to financial considerations   Hyperlipidemia    Hypertension    Stroke [facility])    Tobacco abuse    40 pack years   Past Surgical History:  Procedure Laterality Date   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   Procedure: BYPASS GRAFT CAROTID-SUBCLAVIAN USING HEMASHIELD GOLD GRAFT;  Surgeon: Harvey Carlin FORBES, MD;  Location: [facility] OR;  Service: Vascular;  Laterality: Left;   COLONOSCOPY WITH PROPOFOL  N/A 09/21/2020   Procedure: COLONOSCOPY WITH PROPOFOL ;  Surgeon: San Sandor GAILS, DO;  Location: [facility] ENDOSCOPY;  Service: Gastroenterology;  Laterality: N/A;   CORONARY ANGIOPLASTY WITH STENT PLACEMENT     HEMOSTASIS CLIP PLACEMENT  09/21/2020   Procedure: HEMOSTASIS CLIP PLACEMENT;  Surgeon: San Sandor GAILS, DO;  Location: [facility] ENDOSCOPY;  Service: Gastroenterology;;   IR ANGIO EXTERNAL CAROTID SEL EXT CAROTID BILAT MOD SED  12/15/2019   IR ANGIO VERTEBRAL SEL VERTEBRAL UNI R MOD SED  12/15/2019   IR ANGIOGRAM EXTREMITY LEFT  12/15/2019   IR ANGIOGRAM EXTREMITY LEFT  12/15/2019   IR CT HEAD LTD  12/15/2019  IR PERCUTANEOUS ART THROMBECTOMY/INFUSION INTRACRANIAL INC DIAG ANGIO  12/15/2019   POLYPECTOMY  09/21/2020   Procedure: POLYPECTOMY;  Surgeon: San Sandor GAILS, DO;  Location: [facility] ENDOSCOPY;  Service: Gastroenterology;;   RADIOLOGY WITH ANESTHESIA N/A 12/15/2019   Procedure: IR WITH ANESTHESIA;  Surgeon: Dolphus Carrion, MD;  Location: [facility] OR;  Service: Radiology;  Laterality: N/A;    reports that he has been smoking cigarettes. He has a 34 pack-year smoking history. He has never used smokeless tobacco. He reports that he does not  drink alcohol and does not use drugs. family history includes COPD in his maternal grandfather; Cancer in his paternal grandmother; Depression (age of onset: 60) in his mother; Heart disease in his paternal grandmother; Heart disease (age of onset: 99) in his father; Hyperlipidemia in his father; Hypertension in his father. No Known Allergies    Outpatient Encounter Medications as of 10/25/2023  Medication Sig   acetaminophen  (TYLENOL ) 500 MG tablet Take 1,000 mg by mouth every 6 (six) hours as needed for mild pain (pain score 1-3) or moderate pain (pain score 4-6).   aspirin  EC 81 MG tablet Take 1 tablet (81 mg total) by mouth daily. Swallow whole.   atorvastatin  (LIPITOR ) 80 MG tablet Take 1 tablet by mouth once daily   clopidogrel  (PLAVIX ) 75 MG tablet Take 1 tablet by mouth once daily   Evolocumab  (REPATHA  SURECLICK) 140 MG/ML SOAJ INJECT 1 PEN SUBCUTANEOUSLY EVERY 14 DAYS   ezetimibe  (ZETIA ) 10 MG tablet Take 1 tablet by mouth once daily   metoprolol  succinate (TOPROL -XL) 50 MG 24 hr tablet Take 1 tablet (50 mg total) by mouth daily. Take with or immediately following a meal.   nitroGLYCERIN  (NITROSTAT ) 0.4 MG SL tablet Place 1 tablet (0.4 mg total) under the tongue every 5 (five) minutes as needed for chest pain.   [DISCONTINUED] albuterol  (VENTOLIN  HFA) 108 (90 Base) MCG/ACT inhaler Inhale 2 puffs into the lungs every 6 (six) hours as needed. (Patient not taking: Reported on 09/24/2023)   [DISCONTINUED] amoxicillin -clavulanate (AUGMENTIN ) 875-125 MG tablet Take 1 tablet by mouth every 12 (twelve) hours. (Patient not taking: Reported on 09/24/2023)   [DISCONTINUED] naproxen  (NAPROSYN ) 500 MG tablet Take 1 tablet (500 mg total) by mouth 2 (two) times daily. (Patient not taking: Reported on 09/24/2023)   No facility-administered encounter medications on file as of 10/25/2023.    REVIEW OF SYSTEMS  : All other systems reviewed and negative except where noted in the History of Present  Illness.   PHYSICAL EXAM: BP 138/84   Pulse 82   Ht 5' 9 (1.753 m)   Wt 282 lb (127.9 kg)   BMI 41.64 kg/m  General: Well developed white male in no acute distress Head: Normocephalic and atraumatic Eyes:  Sclerae anicteric, conjunctiva pink. Ears: Normal auditory acuity Lungs: Clear throughout to auscultation; no W/R/R. Heart: Regular rate and rhythm; no M/R/G. Rectal:  Will be done at the time of colonoscopy. Musculoskeletal: Symmetrical with no gross deformities  Skin: No lesions on visible extremities Extremities: No edema  Neurological: Alert oriented x 4, grossly non-focal Psychological:  Alert and cooperative. Normal mood and affect  ASSESSMENT AND PLAN: *Personal history of colon polyps: Had about 14 polyps removed during last colonoscopy in 2021, couple of them were large, most tubular adenomas. *Chronic antiplatelet use with Plavix  due to history of CAD:  Hold Plavix  for 5 days before procedure - will instruct when and how to resume after procedure. Risks and benefits of procedure including bleeding, perforation, infection,  missed lesions, medication reactions and possible hospitalization or surgery if complications occur explained. Additional rare but real risk of cardiovascular event such as heart attack or ischemia/infarct of other organs off Plavix  explained and need to seek urgent help if this occurs. Will communicate by phone or EMR with patient's prescribing provider, Dr. Court, to confirm that holding Plavix  is reasonable in this case.     CC:  Joyce Norleen BROCKS, MD

## 2023-10-25 NOTE — Patient Instructions (Signed)
 You have been scheduled for a colonoscopy. Please follow written instructions given to you at your visit today.   Please pick up your prep supplies at the pharmacy within the next 1-3 days.  If you use inhalers (even only as needed), please bring them with you on the day of your procedure.  DO NOT TAKE 7 DAYS PRIOR TO TEST- Trulicity (dulaglutide) Ozempic, Wegovy (semaglutide) Mounjaro (tirzepatide ) Bydureon Bcise (exanatide extended release)  DO NOT TAKE 1 DAY PRIOR TO YOUR TEST Rybelsus (semaglutide) Adlyxin (lixisenatide) Victoza (liraglutide) Byetta (exanatide) _____________________________________________________________________  _______________________________________________________  If your blood pressure at your visit was 140/90 or greater, please contact your primary care physician to follow up on this.  _______________________________________________________  If you are age 55 or older, your body mass index should be between 23-30. Your Body mass index is 41.64 kg/m. If this is out of the aforementioned range listed, please consider follow up with your Primary Care Provider.  If you are age 4 or younger, your body mass index should be between 19-25. Your Body mass index is 41.64 kg/m. If this is out of the aformentioned range listed, please consider follow up with your Primary Care Provider.   ________________________________________________________  The [facility] providers would like to encourage you to use MYCHART to communicate with providers for non-urgent requests or questions.  Due to long hold times on the telephone, sending your provider a message by [facility] may be a faster and more efficient way to get a response.  Please allow 48 business hours for a response.  Please remember that this is for non-urgent requests.  _______________________________________________________

## 2023-10-26 ENCOUNTER — Other Ambulatory Visit: Payer: Self-pay | Admitting: Family Medicine

## 2023-10-26 DIAGNOSIS — E782 Mixed hyperlipidemia: Secondary | ICD-10-CM

## 2023-10-26 NOTE — Progress Notes (Signed)
 Agree with the assessment and plan as outlined by Doug Sou, PA-C. ? ?Aubriegh Minch, DO, FACG ? ?

## 2023-11-27 ENCOUNTER — Telehealth: Payer: Self-pay | Admitting: *Deleted

## 2023-11-27 NOTE — Telephone Encounter (Signed)
 [facility] Pre-operative Risk Assessment     Request for surgical clearance:     Endoscopy Procedure  What type of surgery is being performed?     12/10/23  When is this surgery scheduled?     colonoscopy  What type of clearance is required ?   Pharmacy  Are there any medications that need to be held prior to surgery and how long? Plavix  5 days  Practice name and name of physician performing surgery?      [facility]  What is your office phone and fax number?      Phone- [phone]  Fax- [phone]  Anesthesia type (None, local, MAC, general) ?       MAC   Please route your response to Powell Misty, CMA

## 2023-11-28 NOTE — Telephone Encounter (Signed)
   Patient Name: Brandon [name]  DOB: [birth year] MRN: [id number]  Primary Cardiologist: Dorn Lesches, MD  Patient's Plavix  is currently managed by neurology and guidance will need to come from managing provider.  Chart reviewed as part of pre-operative protocol coverage.    Wyn Raddle, Jackee Shove, NP 11/28/2023, 7:15 AM

## 2023-11-30 ENCOUNTER — Telehealth: Payer: Self-pay | Admitting: *Deleted

## 2023-11-30 NOTE — Telephone Encounter (Signed)
  NEMIAH KISSNER 11-18-1970 [id number]  11/30/2023   Dear Dr. Joyce:  We have scheduled the above named patient for a(n) colonoscopy procedure. Our records show that (s)he is on anticoagulation therapy.  Please advise as to whether the patient may come off their therapy of Plavix  5 days prior to their procedure which is scheduled for 12/10/23.  Please route your response to Powell Misty, CMA or fax response to [phone].  Sincerely,    [facility]

## 2023-12-03 ENCOUNTER — Encounter: Payer: Self-pay | Admitting: Gastroenterology

## 2023-12-03 NOTE — Telephone Encounter (Signed)
 Left message for patient to call office.

## 2023-12-04 NOTE — Telephone Encounter (Signed)
Per patient he ran out of Plavix and is no longer taking it. Told patient he will need to discuss with his PCP if it needs to be restarted.

## 2023-12-06 ENCOUNTER — Telehealth: Payer: Self-pay | Admitting: *Deleted

## 2023-12-06 NOTE — Telephone Encounter (Signed)
Dr. Barron Alvine,  This pt is scheduled with you on 2/17.  He is a documented difficult intubation and his procedure will need to be done at the hospital.   Thanks,  Cathlyn Parsons

## 2023-12-09 ENCOUNTER — Encounter: Payer: Self-pay | Admitting: Certified Registered Nurse Anesthetist

## 2023-12-10 ENCOUNTER — Ambulatory Visit: Payer: Medicare PPO | Admitting: Gastroenterology

## 2023-12-10 ENCOUNTER — Encounter: Payer: Self-pay | Admitting: Gastroenterology

## 2023-12-10 NOTE — Progress Notes (Signed)
Patient presented to the endoscopy unit today for colonoscopy for ongoing polyp surveillance.  Unfortunately, he has a history of difficult airway and procedure cannot be done in the outpatient endoscopy center.  Additionally, he reports that he has not been taking his Plavix since about 02/2023.  Not sure if this was discontinued by his neurologist or if he ran out and just stopped taking it (notes seem to indicate possibly the latter).  I discussed with the patient and he agrees to postpone procedure today in favor of scheduling at the hospital endoscopy unit at a future date.  In the meantime, will request formal clearance from his Neurologist to proceed with the colonoscopy, along with clarity on whether or not he should be on Plavix vs ASA 81 mg.  Additionally, will request clearance from his Cardiologist to proceed with procedure.   Doristine Locks, DO, [facility]  Gastroenterology

## 2023-12-11 ENCOUNTER — Encounter: Payer: Self-pay | Admitting: Certified Registered Nurse Anesthetist

## 2023-12-13 ENCOUNTER — Telehealth: Payer: Self-pay | Admitting: *Deleted

## 2023-12-13 DIAGNOSIS — Z7902 Long term (current) use of antithrombotics/antiplatelets: Secondary | ICD-10-CM

## 2023-12-13 DIAGNOSIS — Z860100 Personal history of colon polyps, unspecified: Secondary | ICD-10-CM

## 2023-12-13 NOTE — Telephone Encounter (Signed)
Patient informed he needs to follow up with neurology for clearance before about to proceed with scheduling colonoscopy at hospital.

## 2023-12-13 NOTE — Telephone Encounter (Signed)
-----   Message from [facility] [place] [facility] sent at 12/10/2023  3:14 PM EST -----  ----- Message ----- From: Shellia Cleverly, DO Sent: 12/10/2023   2:59 PM EST To: Legrand Como, PA-C; Lamona Curl, CMA  This patient was scheduled for colonoscopy with me today for polyp surveillance.  Unfortunately, has a history of difficult airway and procedure needs to be scheduled at the hospital.  The last time this was done in 2021 it was done at the hospital for the same reason.  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  He is not taking Plavix anymore.  Not sure if this was actually discontinued by his Neurologist, or if he just stopped taking it at some point in time.  Will need the following:  -Request formal clearance from his Neurologist on proceeding with colonoscopy.  Will also need to request clarity on whether or not he should be on Plavix.  If he is on Plavix, request formal hold x 5 days - Request formal clearance from his Cardiologist to proceed with colonoscopy - Once clearance obtained, schedule colonoscopy at [facility] or [facility] endoscopy unit  Thanks.

## 2023-12-13 NOTE — Telephone Encounter (Signed)
Inbound call from patient stating stating he was released due to not being seen in 3 years. States he was advised that he now needs a referral to be seen again. Please advise, thank you.

## 2023-12-13 NOTE — Telephone Encounter (Signed)
 Patient returned call

## 2023-12-13 NOTE — Telephone Encounter (Signed)
 Left message for patient to call office.

## 2023-12-17 NOTE — Telephone Encounter (Signed)
Referral sent to neurology.

## 2023-12-17 NOTE — Addendum Note (Signed)
 Addended by: Mariane Duval on: 12/17/2023 11:17 AM   Modules accepted: Orders

## 2023-12-18 ENCOUNTER — Encounter: Payer: Self-pay | Admitting: Internal Medicine

## 2023-12-27 ENCOUNTER — Ambulatory Visit (INDEPENDENT_AMBULATORY_CARE_PROVIDER_SITE_OTHER): Admitting: Neurology

## 2023-12-27 ENCOUNTER — Encounter: Payer: Self-pay | Admitting: Neurology

## 2023-12-27 VITALS — BP 158/79 | HR 74 | Ht 69.0 in | Wt 290.0 lb

## 2023-12-27 DIAGNOSIS — G4733 Obstructive sleep apnea (adult) (pediatric): Secondary | ICD-10-CM | POA: Diagnosis not present

## 2023-12-27 DIAGNOSIS — Z8673 Personal history of transient ischemic attack (TIA), and cerebral infarction without residual deficits: Secondary | ICD-10-CM

## 2023-12-27 DIAGNOSIS — I6522 Occlusion and stenosis of left carotid artery: Secondary | ICD-10-CM

## 2023-12-27 DIAGNOSIS — G3184 Mild cognitive impairment, so stated: Secondary | ICD-10-CM

## 2023-12-27 NOTE — Progress Notes (Signed)
 [facility] [address] [place]. [place] [address] [phone]       OFFICE CONSULT NOTE  [name] Date of Birth:  1970/11/09 Medical Record Number:  [id number]   Referring MD:  Doug Sou, PA-c  Reason for Referral:  antiplatelet use appropriateness  HPI: [name] is a 53 year old pleasant Caucasian male seen today for office consultation visit.  History is obtained from the patient and review of electronic medical records and I personally reviewed pertinent available imaging films in PACS.  He has past medical history of hypertension, hyperlipidemia, sleep apnea, left carotid occlusion, tobacco abuse coronary artery disease, right coronary stent in 2007. 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  He eventually underwent left carotid to subclavian bypass by Dr. Darrick Penna on 02/09/2020.  He was last seen in the office by me on 01/27/2020 and then lost to follow-up.  Patient with was on aspirin and Plavix until May 2024 and Plavix was discontinued.  Patient's primary care physician has referred the patient to discuss whether patient still needs to be on Plavix or not.  Patient states he is doing well he has had no recurrent TIA or stroke symptoms since nearly 4 years ago.  He is currently on aspirin 81 tolerating well without bruising or bleeding.  His blood pressure is also under good control.  Tolerating Lipitor well without muscle aches and pains and last lipid profile on 09/24/2023 showed LDL-cholesterol to be optimal at 63 mg percent.  Last carotid ultrasound on 08/09/2023 showed right ICA mild 1-39% stenosis and chronic occlusion of the left ICA with a patent left CCA to subclavian bypass.  Patient states he had several months for his  sleep apnea but has not been able to tolerate it.  He has mild short-term memory and cognitive difficulties and word finding difficulties since his stroke which have persisted but they are not progressive or getting worse.  He is still quite independent and manages all his activities of daily living. Prior office note 01/27/2020 : [name] is a 53 year old obese Caucasian male seen today for initial office consultation visit for stroke. History is obtained from the patient, review of electronic medical records and I personally reviewed imaging films in PACS. [name] is a past medical history of coronary artery disease status post stent, hypertension, hyperlipidemia, obesity and tobacco abuse who presented to [name], ED on 12/15/2019 with strokelike symptoms which resolved completely but CT scan showed look like subacute left frontal and parietal MCA branch infarcts. He was found to have left carotid occlusion with some distal recanalization to small core but a large penumbra. He was not considered a candidate for TPA due to late presentation. He was transferred to [facility] with plans for emergent revascularization but despite attempts by Dr. Corliss Skains left carotid could not be opened up. The left subclavian was also found to be occluded. Patient was unable to tolerate an MRI. CT scan showed subacute left parietal and frontal MCA branch infarcts. 2D echo showed normal ejection fraction without cardiac source of embolism. LDL cholesterol 93 mg percent. Hemoglobin A1c was 6.1. Patient was started on dual antiplatelet therapy of aspirin and Plavix and had outpatient vascular surgery referral for subclavian artery bypass to be done electively and in fact  has an appointment to have this done by Dr. Fabienne Bruns on 02/09/2020. He states is done well since discharge has had no recurrent stroke or TIA symptoms. He denies symptoms of subclavian steal in the form of dizziness vertigo blurred vision with  raising of arms above his shoulders on looking up. He however does complain of some short-term memory and cognitive difficulties which are not progressive but are annoying. Is tolerating aspirin and Plavix without bruising or bleeding. He is also tolerating Lipitor well without muscle aches and pains. He still continues to smoke half pack per day and knows he needs has to quit. He has filed for disability.  ROS:   14 system review of systems is positive for memory difficulties, word finding difficulties, sleep apnea, weakness all other systems negative  PMH:  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, due to reduced neck mobility   Heart attack (HCC)    History of noncompliance with medical treatment    Due to financial considerations   Hyperlipidemia    Hypertension    Stroke (HCC)    Tobacco abuse    40 pack years    Social History:  Social History   Socioeconomic History   Marital status: Widowed    Spouse name: Not on file   Number of children: 2   Years of education: Not on file   Highest education level: Not on file  Occupational History   Occupation: Disabled    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  Tobacco Use   Smoking status: Every Day    Current packs/day: 1.00    Average packs/day: 1 pack/day for 34.0 years (34.0 ttl pk-yrs)    Types: Cigarettes   Smokeless tobacco: Never  Vaping Use   Vaping status: Never Used  Substance and Sexual Activity   Alcohol use: No    Comment:   Alcoholism- quit 2002   Drug use: No   Sexual activity: Not Currently  Other Topics Concern   Not on file  Social History Narrative            Social Drivers of Health   Financial Resource Strain: Low Risk  (09/24/2023)   Overall Financial Resource Strain (CARDIA)    Difficulty of Paying Living Expenses: Not very hard  Food Insecurity: No Food Insecurity (09/24/2023)   Hunger Vital Sign    Worried About Running Out of Food in the Last Year: Never true    Ran Out of Food in the Last Year: Never true  Transportation Needs: No Transportation Needs (09/24/2023)   PRAPARE - Administrator, Civil Service (Medical): No    Lack of Transportation (Non-Medical): No  Physical Activity: Insufficiently Active (09/24/2023)   Exercise Vital Sign    Days of Exercise per Week: 3 days    Minutes of Exercise per Session: 30 min  Stress: No Stress Concern Present (09/24/2023)   [employer] of Occupational Health - Occupational Stress Questionnaire    Feeling of Stress : Not at all  Social Connections: Moderately Isolated (09/24/2023)  Social Advertising account executive [NHANES]    Frequency of Communication with Friends and Family: More than three times a week    Frequency of Social Gatherings with Friends and Family: Once a week    Attends Religious Services: More than 4 times per year    Active Member of [employer] or Organizations: No    Attends Banker Meetings: Never    Marital Status: Widowed  Intimate Partner Violence: Not At Risk (09/24/2023)   Humiliation, Afraid, Rape, and Kick questionnaire    Fear of Current or Ex-Partner: No    Emotionally Abused: No    Physically Abused: No    Sexually Abused: No    Medications:   Current Outpatient Medications on File Prior to Visit  Medication Sig Dispense Refill   acetaminophen (TYLENOL) 500 MG tablet Take 1,000 mg by mouth every 6 (six) hours as needed for mild pain (pain score 1-3) or moderate pain (pain score  4-6).     aspirin EC 81 MG tablet Take 1 tablet (81 mg total) by mouth daily. Swallow whole. 90 tablet 3   atorvastatin (LIPITOR) 80 MG tablet Take 1 tablet by mouth once daily 90 tablet 3   Evolocumab (REPATHA SURECLICK) 140 MG/ML SOAJ INJECT 1 PEN SUBCUTANEOUSLY EVERY 14 DAYS 2 mL 11   ezetimibe (ZETIA) 10 MG tablet Take 1 tablet by mouth once daily 90 tablet 1   No current facility-administered medications on file prior to visit.    Allergies:  No Known Allergies  Physical Exam General: Obese middle-aged Caucasian male, seated, in no evident distress Head: head normocephalic and atraumatic.   Neck: supple with no carotid or supraclavicular bruits Cardiovascular: regular rate and rhythm, no murmurs Musculoskeletal: no deformity Skin:  no rash/petichiae Vascular:  Normal pulses all extremities  Neurologic Exam Mental Status: Awake and fully alert. Oriented to place and time. Recent and remote memory intact. Attention span, concentration and fund of knowledge appropriate. Mood and affect appropriate.  Diminished recall 1/3.  Able to name 12 animals which can walk on 4 legs.  Clock drawing 4/4. Cranial Nerves: Fundoscopic exam reveals sharp disc margins. Pupils equal, briskly reactive to light. Extraocular movements full without nystagmus. Visual fields full to confrontation. Hearing intact. Facial sensation intact. Face, tongue, palate moves normally and symmetrically.  Motor: Normal bulk and tone. Normal strength in all tested extremity muscles. Sensory.: intact to touch , pinprick , position and vibratory sensation.  Coordination: Rapid alternating movements normal in all extremities. Finger-to-nose and heel-to-shin performed accurately bilaterally. Gait and Station: Arises from chair without difficulty. Stance is normal. Gait demonstrates normal stride length and balance . Able to heel, toe and tandem walk with great difficulty.  Reflexes: 1+ and symmetric. Toes downgoing.   NIHSS   0 Modified Rankin  1   ASSESSMENT: 53 year old Caucasian male right hemispheric watershed infarcts in 2021 secondary to left carotid and subclavian occlusions/p  failed attempts at endovascular revascularization with multiple vascular risk factors of obesity, borderline hypertension, hyperlipidemia, sleep apnea and occlusive large vessel cerebrovascular disease  .  Patient has undergone left common carotid to subclavian bypass surgery in April 2021.  He is doing well from neurovascular standpoint without recurrent stroke or TIAs but does have mild poststroke cognitive impairment     PLAN:I had a long discussion with the patient regarding his remote stroke and residual mild memory and cognitive difficulties which appear to be nonprogressive.  I recommend he stay on aspirin 81 mg daily alone for secondary  stroke prevention and maintain aggressive risk factor modification with strict control of hypertension blood pressure goal below 130/90, lipids with LDL cholesterol goal below 70 mg percent and diabetes with hemoglobin A1c goal below 6.5%.  Patient also has sleep apnea but has had trouble tolerating CPAP mask in the past and so recommend referral to sleep physician to discuss alternative treatment options for treatment of sleep apnea.  I encouraged him to increase participation in cognitively challenging activities like solving crossword puzzles, playing bridge and sudoku.  We also discussed memory compensation strategies.  Return for follow-up in the future only as necessary and no scheduled appointment was made.  Greater than 50% time during this 45-minute consultation visit were spent in counseling coordination of care about his remote stroke, carotid occlusion, subclavian bypass mild cognitive impairment discussion and answering questions.  Delia Heady, MD Note: This document was prepared with digital dictation and possible smart phrase technology. Any transcriptional errors that result from this  process are unintentional.

## 2023-12-27 NOTE — Patient Instructions (Signed)
 I had a long discussion with the patient regarding his remote stroke and residual mild memory and cognitive difficulties which appear to be nonprogressive.  I recommend he stay on aspirin 81 mg daily alone for secondary stroke prevention and maintain aggressive risk factor modification with strict control of hypertension blood pressure goal below 130/90, lipids with LDL cholesterol goal below 70 mg percent and diabetes with hemoglobin A1c goal below 6.5%.  Patient also has sleep apnea but has had trouble tolerating CPAP mask in the past and so recommend referral to sleep physician to discuss alternative treatment options for treatment of sleep apnea.  I encouraged him to increase participation in cognitively challenging activities like solving crossword puzzles, playing bridge and sudoku.  We also discussed memory compensation strategies.  Return for follow-up in the future only as necessary and no scheduled appointment was made.  Memory Compensation Strategies  Use "WARM" strategy.  W= write it down  A= associate it  R= repeat it  M= make a mental note  2.   You can keep a Glass blower/designer.  Use a 3-ring notebook with sections for the following: calendar, important names and phone numbers,  medications, doctors' names/phone numbers, lists/reminders, and a section to journal what you did  each day.   3.    Use a calendar to write appointments down.  4.    Write yourself a schedule for the day.  This can be placed on the calendar or in a separate section of the Memory Notebook.  Keeping a  regular schedule can help memory.  5.    Use medication organizer with sections for each day or morning/evening pills.  You may need help loading it  6.    Keep a basket, or pegboard by the door.  Place items that you need to take out with you in the basket or on the pegboard.  You may also want to  include a message board for reminders.  7.    Use sticky notes.  Place sticky notes with reminders in a place  where the task is performed.  For example: " turn off the  stove" placed by the stove, "lock the door" placed on the door at eye level, " take your medications" on  the bathroom mirror or by the place where you normally take your medications.  8.    Use alarms/timers.  Use while cooking to remind yourself to check on food or as a reminder to take your medicine, or as a  reminder to make a call, or as a reminder to perform another task, etc.

## 2023-12-28 ENCOUNTER — Telehealth: Payer: Self-pay | Admitting: *Deleted

## 2023-12-28 NOTE — Telephone Encounter (Signed)
-----   Message from Shellia Cleverly sent at 12/28/2023  1:54 PM EST ----- Agreed. This would appear to be c/w Neurology clearance and plan for continued ASA 81 mg (can be continued through endoscopic procedures). ----- Message ----- From: Leta Baptist, PA-C Sent: 12/27/2023   3:53 PM EST To: Mariane Duval, CMA; Shellia Cleverly, DO  Saw neurology.  I guess this clears him to have procedure? ----- Message ----- From: Micki Riley, MD Sent: 12/27/2023   2:52 PM EST To: Ronnald Nian, MD; Leta Baptist, PA-C  Ms Zehr Thank you for referring Mr Cerasoli for neurological consultation. Find enclosed my assessment and plan. Feel free to call for questions if any  Delia Heady, MD

## 2024-01-04 ENCOUNTER — Other Ambulatory Visit: Payer: Self-pay | Admitting: *Deleted

## 2024-01-04 DIAGNOSIS — Z860100 Personal history of colon polyps, unspecified: Secondary | ICD-10-CM

## 2024-01-04 NOTE — Telephone Encounter (Signed)
 Left message for patient to call office.

## 2024-01-04 NOTE — Telephone Encounter (Signed)
 Patient scheduled with Dr. Barron Alvine on Monday 02/18/24 @ 10:45 am at [facility]. Patient informed.

## 2024-01-23 ENCOUNTER — Ambulatory Visit: Admitting: Family Medicine

## 2024-01-23 VITALS — BP 130/82 | HR 78 | Wt 290.4 lb

## 2024-01-23 DIAGNOSIS — H6122 Impacted cerumen, left ear: Secondary | ICD-10-CM | POA: Diagnosis not present

## 2024-01-23 DIAGNOSIS — F339 Major depressive disorder, recurrent, unspecified: Secondary | ICD-10-CM

## 2024-01-23 MED ORDER — CITALOPRAM HYDROBROMIDE 20 MG PO TABS: 20.0000 mg | ORAL_TABLET | Freq: Every day | ORAL | 3 refills | Status: DC

## 2024-01-23 NOTE — Patient Instructions (Addendum)
 Go to the drugstore and get Debrox set up to come back next Tuesday Set up a virtual visit in a month when you check
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
## 2024-01-29 ENCOUNTER — Encounter: Payer: Self-pay | Admitting: Family Medicine

## 2024-01-29 ENCOUNTER — Ambulatory Visit: Admitting: Family Medicine

## 2024-01-29 VITALS — BP 130/84 | HR 58 | Wt 293.4 lb

## 2024-01-29 DIAGNOSIS — F339 Major depressive disorder, recurrent, unspecified: Secondary | ICD-10-CM

## 2024-01-29 DIAGNOSIS — H6122 Impacted cerumen, left ear: Secondary | ICD-10-CM

## 2024-01-29 DIAGNOSIS — H6592 Unspecified nonsuppurative otitis media, left ear: Secondary | ICD-10-CM | POA: Diagnosis not present

## 2024-01-29 MED ORDER — AMOXICILLIN-POT CLAVULANATE 875-125 MG PO TABS: 1.0000 | ORAL_TABLET | Freq: Two times a day (BID) | ORAL | 0 refills | Status: DC

## 2024-01-29 NOTE — Progress Notes (Signed)
   Subjective:    Patient ID: Brandon [name], male    DOB: 12-12-1970, 53 y.o.   MRN: [id number]  HPI He is here for a recheck.  He did not use Debrox stating he forgot.  He is not having any ear pain.  He does have an appointment set up for counseling.   Review of Systems     Objective:    Physical Exam The cerumen was removed from the left ear without difficulty.  The TM was erythematous with very poor markings.       Assessment & Plan:  Left non-suppurative otitis media - Plan: DISCONTINUED: amoxicillin-clavulanate (AUGMENTIN) 875-125 MG tablet  Impacted cerumen of left ear  Depression, recurrent (HCC) Although he is not having any pain, the eardrum definitely looks infected and therefore I will give him Augmentin and have him return here in 2 weeks for recheck.  If no improvement might need to refer to ENT.

## 2024-02-07 ENCOUNTER — Ambulatory Visit: Admitting: Licensed Clinical Social Worker

## 2024-02-12 ENCOUNTER — Ambulatory Visit: Admitting: Family Medicine

## 2024-02-12 ENCOUNTER — Telehealth: Payer: Self-pay

## 2024-02-12 ENCOUNTER — Encounter (HOSPITAL_COMMUNITY): Payer: Self-pay | Admitting: Gastroenterology

## 2024-02-12 VITALS — BP 112/84 | HR 67 | Wt 291.6 lb

## 2024-02-12 DIAGNOSIS — H6692 Otitis media, unspecified, left ear: Secondary | ICD-10-CM | POA: Diagnosis not present

## 2024-02-12 MED ORDER — LEVOFLOXACIN 500 MG PO TABS: 500.0000 mg | ORAL_TABLET | Freq: Every day | ORAL | 0 refills | Status: AC

## 2024-02-12 NOTE — Progress Notes (Signed)
   Subjective:    Patient ID: Brandon [name], male    DOB: 1971/05/07, 53 y.o.   MRN: [id number]  HPI He is here for a recheck.  He still is having some difficulty with hearing from the left ear.  He finished the antibiotic.   Review of Systems     Objective:    Physical Exam Left TM still shows poor landmarks, slightly whitish and vascular.  Right TM and canal normal.       Assessment & Plan:  Left otitis media, unspecified otitis media type - Plan: Ambulatory referral to ENT, levofloxacin  (LEVAQUIN ) 500 MG tablet I will switch him to Levaquin  and also refer to ENT since he has not really responded.  If the Levaquin  does clear this problem up he will then cancel the appointment with ENT.

## 2024-02-12 NOTE — Telephone Encounter (Signed)
 Procedure:colon Procedure date: 02/18/24 Procedure location: wl Arrival Time: 44 Spoke with the patient Y/N: y Any prep concerns? n  Has the patient obtained the prep from the pharmacy ? y Do you have a care partner and transportation: y Any additional concerns? n

## 2024-02-12 NOTE — Progress Notes (Signed)
 Attempted to obtain medical history for pre op call via telephone, unable to reach at this time. HIPAA compliant voicemail message left requesting return call to pre surgical testing department.

## 2024-02-15 ENCOUNTER — Other Ambulatory Visit: Payer: Self-pay | Admitting: Cardiovascular Disease

## 2024-02-15 DIAGNOSIS — I2510 Atherosclerotic heart disease of native coronary artery without angina pectoris: Secondary | ICD-10-CM

## 2024-02-15 DIAGNOSIS — I6522 Occlusion and stenosis of left carotid artery: Secondary | ICD-10-CM

## 2024-02-15 DIAGNOSIS — E782 Mixed hyperlipidemia: Secondary | ICD-10-CM

## 2024-02-18 ENCOUNTER — Encounter (HOSPITAL_COMMUNITY)
Admission: RE | Disposition: A | Discharge status: Home / Self Care | Payer: Self-pay | Source: Home / Self Care | Attending: Gastroenterology

## 2024-02-18 ENCOUNTER — Ambulatory Visit (HOSPITAL_COMMUNITY)
Admission: RE | Admit: 2024-02-18 | Discharge: 2024-02-18 | Disposition: A | Discharge status: Home / Self Care | Attending: Gastroenterology | Admitting: Gastroenterology

## 2024-02-18 ENCOUNTER — Encounter (HOSPITAL_COMMUNITY): Payer: Self-pay | Admitting: Gastroenterology

## 2024-02-18 ENCOUNTER — Ambulatory Visit (HOSPITAL_COMMUNITY): Admitting: Anesthesiology

## 2024-02-18 ENCOUNTER — Other Ambulatory Visit: Payer: Self-pay

## 2024-02-18 ENCOUNTER — Ambulatory Visit (HOSPITAL_BASED_OUTPATIENT_CLINIC_OR_DEPARTMENT_OTHER): Admitting: Anesthesiology

## 2024-02-18 DIAGNOSIS — F32A Depression, unspecified: Secondary | ICD-10-CM | POA: Insufficient documentation

## 2024-02-18 DIAGNOSIS — I739 Peripheral vascular disease, unspecified: Secondary | ICD-10-CM | POA: Insufficient documentation

## 2024-02-18 DIAGNOSIS — Z9889 Other specified postprocedural states: Secondary | ICD-10-CM

## 2024-02-18 DIAGNOSIS — I10 Essential (primary) hypertension: Secondary | ICD-10-CM | POA: Insufficient documentation

## 2024-02-18 DIAGNOSIS — I252 Old myocardial infarction: Secondary | ICD-10-CM | POA: Diagnosis not present

## 2024-02-18 DIAGNOSIS — K648 Other hemorrhoids: Secondary | ICD-10-CM | POA: Insufficient documentation

## 2024-02-18 DIAGNOSIS — Z1211 Encounter for screening for malignant neoplasm of colon: Secondary | ICD-10-CM | POA: Diagnosis not present

## 2024-02-18 DIAGNOSIS — Z860101 Personal history of adenomatous and serrated colon polyps: Secondary | ICD-10-CM

## 2024-02-18 DIAGNOSIS — E66813 Obesity, class 3: Secondary | ICD-10-CM | POA: Diagnosis not present

## 2024-02-18 DIAGNOSIS — Z860100 Personal history of colon polyps, unspecified: Secondary | ICD-10-CM

## 2024-02-18 DIAGNOSIS — Z955 Presence of coronary angioplasty implant and graft: Secondary | ICD-10-CM | POA: Diagnosis not present

## 2024-02-18 DIAGNOSIS — Z7982 Long term (current) use of aspirin: Secondary | ICD-10-CM | POA: Insufficient documentation

## 2024-02-18 DIAGNOSIS — D369 Benign neoplasm, unspecified site: Secondary | ICD-10-CM | POA: Insufficient documentation

## 2024-02-18 DIAGNOSIS — K621 Rectal polyp: Secondary | ICD-10-CM | POA: Diagnosis not present

## 2024-02-18 DIAGNOSIS — Z6841 Body Mass Index (BMI) 40.0 and over, adult: Secondary | ICD-10-CM | POA: Diagnosis not present

## 2024-02-18 DIAGNOSIS — I2510 Atherosclerotic heart disease of native coronary artery without angina pectoris: Secondary | ICD-10-CM | POA: Insufficient documentation

## 2024-02-18 DIAGNOSIS — F17210 Nicotine dependence, cigarettes, uncomplicated: Secondary | ICD-10-CM | POA: Diagnosis not present

## 2024-02-18 DIAGNOSIS — D125 Benign neoplasm of sigmoid colon: Secondary | ICD-10-CM | POA: Diagnosis not present

## 2024-02-18 DIAGNOSIS — Z8673 Personal history of transient ischemic attack (TIA), and cerebral infarction without residual deficits: Secondary | ICD-10-CM | POA: Insufficient documentation

## 2024-02-18 HISTORY — PX: COLONOSCOPY: SHX5424

## 2024-02-18 HISTORY — PX: POLYPECTOMY: SHX149

## 2024-02-18 SURGERY — COLONOSCOPY
Anesthesia: Monitor Anesthesia Care

## 2024-02-18 MED ORDER — SODIUM CHLORIDE 0.9 % IV SOLN
INTRAVENOUS | Status: AC | PRN
  Administered 2024-02-18: 500 mL via INTRAMUSCULAR

## 2024-02-18 MED ORDER — PROPOFOL 10 MG/ML IV BOLUS
INTRAVENOUS | Status: DC | PRN
Start: 2024-02-18 — End: 2024-02-18
  Administered 2024-02-18: 75 ug/kg/min via INTRAVENOUS
  Administered 2024-02-18: 50 mg via INTRAVENOUS

## 2024-02-18 MED ORDER — SODIUM CHLORIDE 0.9 % IV SOLN: INTRAVENOUS | Status: DC

## 2024-02-18 MED ORDER — SODIUM CHLORIDE 0.9 % IV SOLN: INTRAVENOUS | Status: DC | PRN

## 2024-02-18 NOTE — Anesthesia Preprocedure Evaluation (Signed)
 Anesthesia Evaluation  Patient identified by MRN, date of birth, ID band Patient awake    Reviewed: Allergy & Precautions, NPO status , Patient's Chart, lab work & pertinent test results  History of Anesthesia Complications (+) DIFFICULT AIRWAY and history of anesthetic complications  Airway Mallampati: III  TM Distance: >3 FB Neck ROM: Full    Dental  (+) Poor Dentition, Loose, Missing, Dental Advisory Given Large gap between front two teeth:   Pulmonary sleep apnea , Current Smoker and Patient abstained from smoking.   Pulmonary exam normal        Cardiovascular hypertension, Pt. on medications and Pt. on home beta blockers + CAD, + Past MI, + Cardiac Stents and + Peripheral Vascular Disease (on ASA/Plavix )  Normal cardiovascular exam  Sublcavian steal syndrome  '21 TTE - EF 55 to 60%. LV was mildly dilated. Elevated  left ventricular end-diastolic pressure. Trivial AI.  '21 Carotid US  - Right Carotid: 1-39% stenosis.  Left Carotid: The internal carotid artery appears to be occluded.  Vertebrals: Right vertebral artery demonstrates antegrade flow. Left vertebral artery demonstrates retrograde flow.  Subclavians: Normal flow hemodynamics were seen in the right subclavian artery. Monophasic flow in the left subclavian artery.       Neuro/Psych  PSYCHIATRIC DISORDERS  Depression    CVA, No Residual Symptoms    GI/Hepatic Neg liver ROS,GERD  Controlled,,  Endo/Other    Class 3 obesity  Renal/GU negative Renal ROS     Musculoskeletal negative musculoskeletal ROS (+)    Abdominal   Peds negative pediatric ROS (+)  Hematology  Last dose of plavix  1 week ago    Anesthesia Other Findings    Reproductive/Obstetrics                             Anesthesia Physical Anesthesia Plan  ASA: 3  Anesthesia Plan: MAC   Post-op Pain Management:    Induction: Intravenous  PONV Risk Score and  Plan: 2 and Treatment may vary due to age or medical condition, Ondansetron  and Propofol  infusion  Airway Management Planned: Natural Airway and Simple Face Mask  Additional Equipment:   Intra-op Plan:   Post-operative Plan:   Informed Consent: I have reviewed the patients History and Physical, chart, labs and discussed the procedure including the risks, benefits and alternatives for the proposed anesthesia with the patient or authorized representative who has indicated his/her understanding and acceptance.     Dental advisory given  Plan Discussed with: CRNA and Anesthesiologist  Anesthesia Plan Comments:         Anesthesia Quick Evaluation

## 2024-02-18 NOTE — H&P (Signed)
 GASTROENTEROLOGY PROCEDURE H&P NOTE   Primary Care Physician: Watson Hacking, MD    Reason for Procedure:  Colon polyp surveillance  Plan:    Colonoscopy   Patient is appropriate for endoscopic procedure(s) at [facility].  The nature of the procedure, as well as the risks, benefits, and alternatives were carefully and thoroughly reviewed with the patient. Ample time for discussion and questions allowed. The patient understood, was satisfied, and agreed to proceed.     HPI: Brandon [name] is a 53 y.o. male who presents for colonoscopy for ongoing polyp surveillance.  Last colonoscopy was 08/2020 and notable for 3-8 mm polyps x 11 removed from the sigmoid/descending colon (adenoma x 6, HP x 5), 15 mm adenoma removed from the sigmoid colon via hot snare, 20 mm rectal TVA removed via hot snare and clip closure, 3 smaller 3-4 mm rectal polyps (adenoma x 1, HP x 2), and internal hemorrhoids.  Had recommended repeat in 1 year.    He was recently seen by his neurologist and recommended continued ASA 81 mg, but no longer on Plavix .  Procedure scheduled at [facility] Endoscopy unit due to history of difficult airway.  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, due to reduced neck mobility   Heart attack (HCC)    History of noncompliance with medical treatment    Due to financial considerations   Hyperlipidemia    Hypertension    Stroke [facility])    Tobacco abuse    40 pack years     Past Surgical History:  Procedure Laterality Date   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   Procedure: BYPASS GRAFT CAROTID-SUBCLAVIAN USING HEMASHIELD GOLD GRAFT;  Surgeon: Richrd Char, MD;  Location: [facility] OR;  Service: Vascular;  Laterality: Left;   COLONOSCOPY WITH PROPOFOL  N/A 09/21/2020   Procedure: COLONOSCOPY WITH PROPOFOL ;  Surgeon: Annis Kinder, DO;  Location: [facility] ENDOSCOPY;  Service: Gastroenterology;  Laterality: N/A;   CORONARY ANGIOPLASTY WITH STENT PLACEMENT     HEMOSTASIS CLIP PLACEMENT  09/21/2020   Procedure: HEMOSTASIS CLIP PLACEMENT;  Surgeon: Annis Kinder, DO;  Location: [facility] ENDOSCOPY;  Service: Gastroenterology;;   IR ANGIO EXTERNAL CAROTID SEL EXT CAROTID BILAT MOD SED  12/15/2019   IR ANGIO VERTEBRAL SEL VERTEBRAL UNI R MOD SED  12/15/2019   IR ANGIOGRAM EXTREMITY LEFT  12/15/2019   IR ANGIOGRAM EXTREMITY LEFT  12/15/2019   IR CT HEAD LTD  12/15/2019   IR PERCUTANEOUS ART THROMBECTOMY/INFUSION INTRACRANIAL INC DIAG ANGIO  12/15/2019   POLYPECTOMY  09/21/2020   Procedure: POLYPECTOMY;  Surgeon: Annis Kinder, DO;  Location: [facility] ENDOSCOPY;  Service: Gastroenterology;;   RADIOLOGY WITH ANESTHESIA N/A 12/15/2019   Procedure: IR WITH ANESTHESIA;  Surgeon: Luellen Sages, MD;  Location: [facility] OR;  Service: Radiology;  Laterality: N/A;    Prior to Admission medications   Medication Sig Start Date End Date Taking? Authorizing Provider  aspirin  EC 81 MG tablet Take 1 tablet (81 mg total) by mouth daily. Swallow whole. 05/31/21  Yes Lauro Portal  J, MD  atorvastatin  (LIPITOR ) 80 MG tablet Take 1 tablet by mouth once daily 10/03/23  Yes Avanell Leigh, MD  citalopram  (CELEXA ) 20 MG tablet Take 1 tablet (20 mg total) by mouth daily. 01/23/24  Yes Watson Hacking, MD  Evolocumab  (REPATHA  SURECLICK) 140 MG/ML SOAJ INJECT 1 PEN  SUBCUTANEOUSLY EVERY 14 DAYS 02/15/24  Yes Avanell Leigh, MD  ezetimibe  (ZETIA ) 10 MG tablet Take 1 tablet by mouth once  daily 10/26/23  Yes Lalonde, John C, MD  levofloxacin  (LEVAQUIN ) 500 MG tablet Take 1 tablet (500 mg total) by mouth daily for 7 days. 02/12/24 02/19/24 Yes Lalonde, John C, MD  metoprolol  succinate (TOPROL -XL) 50 MG 24 hr tablet Take 50 mg by mouth daily. 01/18/24  Yes [provider]  acetaminophen  (TYLENOL ) 500 MG tablet Take 1,000 mg by mouth every 6 (six) hours as needed for mild pain (pain score 1-3) or moderate pain (pain score 4-6). Patient not taking: Reported on 01/29/2024    [provider]  amoxicillin -clavulanate (AUGMENTIN ) 875-125 MG tablet Take 1 tablet by mouth 2 (two) times daily. Patient not taking: Reported on 02/12/2024 01/29/24   Watson Hacking, MD    Current Facility-Administered Medications  Medication Dose Route Frequency Provider Last Rate Last Admin   0.9 %  sodium chloride  infusion   Intravenous Continuous Zehr, Jessica D, PA-C        Allergies as of 01/04/2024   (No Known Allergies)    Family History  Problem Relation Age of Onset   Depression Mother 81       Suicide   Heart disease Father 40       Deceased from massive heart-attack   Hyperlipidemia Father    Hypertension Father    COPD Maternal Grandfather    Cancer Paternal Grandmother        Male Cancer   Heart disease Paternal Grandmother    Colon cancer Neg Hx    Pancreatic cancer Neg Hx    Esophageal cancer Neg Hx     Social History   Socioeconomic History   Marital status: Widowed    Spouse name: Not on file   Number of children: 2   Years of education: Not on file   Highest education level: Not on file  Occupational History   Occupation: Disabled    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  Tobacco Use   Smoking status: Every Day    Current packs/day: 1.00    Average packs/day: 1 pack/day for 34.0 years (34.0 ttl pk-yrs)    Types: Cigarettes   Smokeless tobacco: Never  Vaping Use   Vaping status: Never Used  Substance and Sexual Activity   Alcohol  use: No    Comment:  Alcoholism- quit 2002   Drug use: No   Sexual activity: Not Currently  Other Topics Concern   Not on file  Social History Narrative            Social Drivers of Health   Financial Resource Strain: Low Risk  (09/24/2023)   Overall Financial Resource Strain (CARDIA)    Difficulty of Paying Living Expenses: Not very hard  Food Insecurity: No Food Insecurity (09/24/2023)   Hunger Vital Sign    Worried About Running Out of Food in the Last Year: Never true    Ran Out of Food in the Last Year: Never true  Transportation Needs: No Transportation Needs (09/24/2023)   PRAPARE - Administrator, Civil Service (Medical): No  Lack of Transportation (Non-Medical): No  Physical Activity: Insufficiently Active (09/24/2023)   Exercise Vital Sign    Days of Exercise per Week: 3 days    Minutes of Exercise per Session: 30 min  Stress: No Stress Concern Present (09/24/2023)   [employer] of Occupational Health - Occupational Stress Questionnaire    Feeling of Stress : Not at all  Social Connections: Moderately Isolated (09/24/2023)   Social Connection and Isolation Panel [NHANES]    Frequency of Communication with Friends and Family: More than three times a week    Frequency of Social Gatherings with Friends and Family: Once a week    Attends Religious Services: More than 4 times per year    Active Member of [employer] or Organizations: No    Attends Banker Meetings: Never    Marital Status: Widowed  Intimate Partner Violence: Not At Risk (09/24/2023)   Humiliation, Afraid, Rape, and Kick questionnaire    Fear of Current or Ex-Partner: No    Emotionally Abused: No    Physically Abused: No    Sexually Abused: No    Physical Exam: Vital signs in last 24 hours: @BP  (!) 154/78   Pulse 69   Temp 97.7 F (36.5 C) (Temporal)   Resp 20   Ht 5\' 9"  (1.753 m)   Wt 132.3 kg   SpO2 97%   BMI 43.07 kg/m  GEN: NAD EYE: Sclerae anicteric ENT:  MMM CV: Non-tachycardic Pulm: CTA b/l GI: Soft, NT/ND NEURO:  Alert & Oriented x 3   Harry Lindau, DO [facility]   02/18/2024 9:45 AM

## 2024-02-18 NOTE — Interval H&P Note (Signed)
 History and Physical Interval Note:  02/18/2024 9:47 AM  Brandon [name]  has presented today for surgery, with the diagnosis of history of colon polyps Z86.0100.  The various methods of treatment have been discussed with the patient and family. After consideration of risks, benefits and other options for treatment, the patient has consented to  Procedure(s): COLONOSCOPY (N/A) as a surgical intervention.  The patient's history has been reviewed, patient examined, no change in status, stable for surgery.  I have reviewed the patient's chart and labs.  Questions were answered to the patient's satisfaction.     Brandon [name]

## 2024-02-18 NOTE — Anesthesia Postprocedure Evaluation (Signed)
 Anesthesia Post Note  Patient: Brandon [name]  Procedure(s) Performed: COLONOSCOPY POLYPECTOMY, INTESTINE     Patient location during evaluation: PACU Anesthesia Type: MAC Level of consciousness: awake and alert Pain management: pain level controlled Vital Signs Assessment: post-procedure vital signs reviewed and stable Respiratory status: spontaneous breathing, nonlabored ventilation, respiratory function stable and patient connected to nasal cannula oxygen  Cardiovascular status: stable and blood pressure returned to baseline Postop Assessment: no apparent nausea or vomiting Anesthetic complications: no  No notable events documented.  Last Vitals:  Vitals:   02/18/24 1050 02/18/24 1059  BP: (!) 104/43 126/70  Pulse: 68 64  Resp: 15 15  Temp:    SpO2: 95% 93%    Last Pain:  Vitals:   02/18/24 1047  TempSrc: Oral  PainSc: 0-No pain                 Melvenia Stabs

## 2024-02-18 NOTE — Op Note (Signed)
 [facility] Patient Name: Brandon [name] Procedure Date: 02/18/2024 MRN: [id number] Attending MD: Harry Lindau , MD, [id number] Date of Birth: 03-14-1971 CSN: [id number] Age: 53 Admit Type: Outpatient Procedure:                Colonoscopy Indications:              Surveillance: Personal history of adenomatous                            polyps on last colonoscopy >3 years ago                           Last colonoscopy was 08/2020 and notable for 3-8 mm                            polyps x 11 removed from the sigmoid/descending                            colon (adenoma x 6, HP x 5), 15 mm adenoma removed                            from the sigmoid colon via hot snare, 20 mm rectal                            TVA removed via hot snare and clip closure, 3                            smaller 3-4 mm rectal polyps (adenoma x 1, HP x 2),                            and internal hemorrhoids. Providers:                Harry Lindau, MD, Ila Malay, RN, Tyrus Gallus, Technician Referring MD:              Medicines:                Monitored Anesthesia Care Complications:            No immediate complications. Estimated Blood Loss:     Estimated blood loss was minimal. Procedure:                Pre-Anesthesia Assessment:                           - Prior to the procedure, a History and Physical                            was performed, and patient medications and                            allergies were reviewed. The patient's tolerance of  previous anesthesia was also reviewed. The risks                            and benefits of the procedure and the sedation                            options and risks were discussed with the patient.                            All questions were answered, and informed consent                            was obtained. Prior Anticoagulants: The patient has                            taken  no anticoagulant or antiplatelet agents                            except for aspirin . ASA Grade Assessment: III - A                            patient with severe systemic disease. After                            reviewing the risks and benefits, the patient was                            deemed in satisfactory condition to undergo the                            procedure.                           After obtaining informed consent, the colonoscope                            was passed under direct vision. Throughout the                            procedure, the patient's blood pressure, pulse, and                            oxygen  saturations were monitored continuously. The                            CF-HQ190L ([id number]) Olympus colonoscope was                            introduced through the anus and advanced to the the                            terminal ileum. The colonoscopy was performed  without difficulty. The patient tolerated the                            procedure well. The quality of the bowel                            preparation was good. The terminal ileum, ileocecal                            valve, appendiceal orifice, and rectum were                            photographed. Scope In: 10:03:41 AM Scope Out: 10:30:30 AM Scope Withdrawal Time: 0 hours 25 minutes 2 seconds  Total Procedure Duration: 0 hours 26 minutes 49 seconds  Findings:      The perianal and digital rectal examinations were normal.      Four sessile polyps were found in the sigmoid colon. The polyps were 3       to 4 mm in size. These polyps were removed with a cold snare. Resection       and retrieval were complete. Estimated blood loss was minimal.      A 4 mm polyp was found in the distal rectum. The polyp was sessile. The       polyp was removed with a cold snare. Resection and retrieval were       complete. Estimated blood loss was minimal.      Eight sessile polyps  were found in the rectum. The polyps were 2 to 4 mm       in size. These polyps were removed with a cold snare. Resection and       retrieval were complete. Estimated blood loss was minimal.      A small post polypectomy scar was found in the rectum. The scar tissue       was healthy in appearance without any evidence of residual polypoid       tissue or regrowth, using both white light and narrow-band imaging.      Non-bleeding internal hemorrhoids were found during retroflexion. The       hemorrhoids were small.      The terminal ileum appeared normal. Impression:               - Four 3 to 4 mm polyps in the sigmoid colon,                            removed with a cold snare. Resected and retrieved.                           - One 4 mm polyp in the distal rectum, removed with                            a cold snare. Resected and retrieved.                           - Eight 2 to 4 mm polyps in the rectum, removed  with a cold snare. Resected and retrieved.                           - Post-polypectomy scar in the rectum.                           - Non-bleeding internal hemorrhoids.                           - The examined portion of the ileum was normal. Moderate Sedation:      Not Applicable - Patient had care per Anesthesia. Recommendation:           - Patient has a contact number available for                            emergencies. The signs and symptoms of potential                            delayed complications were discussed with the                            patient. Return to normal activities tomorrow.                            Written discharge instructions were provided to the                            patient.                           - Resume previous diet.                           - Continue present medications.                           - Await pathology results.                           - Repeat colonoscopy in 3 years for  surveillance.                           - Return to GI office PRN. Procedure Code(s):        --- Professional ---                           [id number], Colonoscopy, flexible; with removal of                            tumor(s), polyp(s), or other lesion(s) by snare                            technique Diagnosis Code(s):        --- Professional ---  Z86.010, Personal history of colonic polyps                           D12.5, Benign neoplasm of sigmoid colon                           D12.8, Benign neoplasm of rectum                           Z98.890, Other specified postprocedural states                           K64.8, Other hemorrhoids CPT copyright 2022 American Medical Association. All rights reserved. The codes documented in this report are preliminary and upon coder review may  be revised to meet current compliance requirements. Harry Lindau, MD 02/18/2024 10:40:23 AM Number of Addenda: 0

## 2024-02-18 NOTE — Discharge Instructions (Signed)

## 2024-02-18 NOTE — Transfer of Care (Signed)
 Immediate Anesthesia Transfer of Care Note  Patient: Brandon [name]  Procedure(s) Performed: COLONOSCOPY POLYPECTOMY, INTESTINE  Patient Location: PACU and Endoscopy Unit  Anesthesia Type:MAC  Level of Consciousness: sedated  Airway & Oxygen  Therapy: Patient Spontanous Breathing and Patient connected to face mask oxygen   Post-op Assessment: Report given to RN and Post -op Vital signs reviewed and stable  Post vital signs: Reviewed and stable  Last Vitals:  Vitals Value Taken Time  BP 113/65 04/28/2501038  Temp    Pulse 66 02/18/24 1038  Resp 17 02/18/24 1038  SpO2 92 % 02/18/24 1038  Vitals shown include unfiled device data.  Last Pain:  Vitals:   02/18/24 0943  TempSrc: Temporal  PainSc: 0-No pain         Complications: No notable events documented.

## 2024-02-18 NOTE — Anesthesia Procedure Notes (Signed)
 Procedure Name: MAC Date/Time: 02/18/2024 9:58 AM  Performed by: Darlena Ego, CRNAPre-anesthesia Checklist: Patient identified, Emergency Drugs available, Patient being monitored, Suction available and Timeout performed Patient Re-evaluated:Patient Re-evaluated prior to induction Oxygen  Delivery Method: Simple face mask Preoxygenation: Pre-oxygenation with 100% oxygen 

## 2024-02-19 DIAGNOSIS — D369 Benign neoplasm, unspecified site: Secondary | ICD-10-CM | POA: Insufficient documentation

## 2024-02-19 LAB — SURGICAL PATHOLOGY

## 2024-02-20 ENCOUNTER — Encounter (HOSPITAL_COMMUNITY): Payer: Self-pay | Admitting: Gastroenterology

## 2024-02-20 ENCOUNTER — Encounter: Payer: Self-pay | Admitting: Gastroenterology

## 2024-03-03 NOTE — Progress Notes (Unsigned)
 @Patient  ID: Brandon [name], male    DOB: [birth year], 53 y.o.   MRN: [id number]  No chief complaint on file.   Referring provider: Watson Hacking, MD  HPI: 53 year old male, current every day smoker (34-pack-year history).  Past medical history significant for hypertension, thoracic aortic aneurysm without rupture, arteriosclerotic cardiovascular disease, sleep apnea, tobacco use.  Patient of Dr. Gaynell Keeler, seen for initial consult in May 2021 for shortness of breath.  Started on Breo Ellipta  10. Patient ordered for pulmonary function testing.   Previous [facility] pulmonary encounter: 04/30/2020 Patient presents today for 67-month follow-up. He had LLL pneumonia in late April. States that his shortness of breath is not as bad. He is taking Breo Ellipta  daily as prescribed. He has noticed some improvement with this inhaler but not an overwhelming difference. He is still smoking 1ppd. He is not ready to quit but is thinking about it. He reports getting bronchitis every other year in the fall. States that he has put on a lot of weight over the last year. He is considering going on a 1,200cal diet. He reports loud snoring at night. He has not had sleep test yet. BNP normal in April. Eosinophils 400 in February 2021. Denies cough, chest tightness or wheezing.    03/04/2024  Sleep consult       Pulmonary function testing: 04/30/2020-FVC 2.64 (50%), FEV1 2.32 (57%), ratio 88, TLC 68%, DLCOcor 22.78 (75%) Interpretation: Moderate to severe restriction with no overt obstruction. No BD response. Mild diffusion defect  No Known Allergies  Immunization History  Administered Date(s) Administered   Influenza Whole 06/27/2011   Influenza, Seasonal, Injecte, Preservative Fre 09/24/2023   Influenza,inj,Quad PF,6+ Mos 06/19/2017, 07/24/2022   Moderna Sars-Covid-2 Vaccination 10/28/2020   PFIZER(Purple Top)SARS-COV-2 Vaccination 01/15/2020, 02/07/2020   PNEUMOCOCCAL CONJUGATE-20 06/08/2021   Tdap 04/29/2020     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, due to reduced neck mobility   Heart attack (HCC)    History of noncompliance with medical treatment    Due to financial considerations   Hyperlipidemia    Hypertension    Stroke (HCC)    Tobacco abuse    40 pack years    Tobacco History: Social History   Tobacco Use  Smoking Status Every Day   Current packs/day: 1.00   Average packs/day: 1 pack/day for 34.0 years (34.0 ttl pk-yrs)   Types: Cigarettes  Smokeless Tobacco Never   Ready to quit: Not Answered Counseling given: Not Answered   Outpatient Medications Prior to Visit  Medication Sig Dispense Refill   amoxicillin -clavulanate (AUGMENTIN ) 875-125 MG tablet Take 1 tablet by mouth 2 (two) times daily. (Patient not taking: Reported on 02/12/2024) 20 tablet 0   aspirin  EC 81 MG tablet Take 1 tablet (81 mg total) by mouth daily. Swallow whole. 90 tablet 3   atorvastatin  (LIPITOR ) 80 MG tablet Take 1 tablet by mouth once daily 90 tablet 3   citalopram  (CELEXA ) 20 MG tablet Take 1 tablet (20 mg total) by mouth daily. 30 tablet 3   Evolocumab  (REPATHA  SURECLICK)  140 MG/ML SOAJ INJECT 1 PEN  SUBCUTANEOUSLY EVERY 14 DAYS 6 mL 1   ezetimibe  (ZETIA ) 10 MG tablet Take 1 tablet by mouth once daily 90 tablet 1   metoprolol  succinate (TOPROL -XL) 50 MG 24 hr tablet Take 50 mg by mouth daily.     No facility-administered medications prior to visit.      Review of Systems  Review of Systems   Physical Exam  There  were no vitals taken for this visit. Physical Exam   Lab Results:  CBC    Component Value Date/Time   WBC 8.1 06/02/2023 0939   RBC 5.39 06/02/2023 0939   HGB 16.9 06/02/2023 0939   HGB 16.5 10/31/2019 1432   HCT 49.1 06/02/2023 0939   HCT 47.5 10/31/2019 1432   PLT 157 06/02/2023 0939   PLT 172 10/31/2019 1432   MCV 91.1 06/02/2023 0939   MCV 92 10/31/2019 1432   MCH 31.4 06/02/2023 0939   MCHC 34.4 06/02/2023 0939   RDW 13.4 06/02/2023 0939   RDW 13.5 10/31/2019 1432   LYMPHSABS 4.3 (H) 12/15/2019 0357   LYMPHSABS 3.5 (H) 10/31/2019 1432   MONOABS 0.6 12/15/2019 0357   EOSABS 0.4 12/15/2019 0357   EOSABS 0.4 10/31/2019 1432   BASOSABS 0.2 (H) 12/15/2019 0357   BASOSABS 0.1 10/31/2019 1432    BMET    Component Value Date/Time   NA 140 09/24/2023 1427   K 4.3 09/24/2023 1427   CL 102 09/24/2023 1427   CO2 23 09/24/2023 1427   GLUCOSE 91 09/24/2023 1427   GLUCOSE 96 06/02/2023 0939   BUN 13 09/24/2023 1427   CREATININE 0.85 09/24/2023 1427   CREATININE 0.92 06/19/2017 1004   CALCIUM  9.5 09/24/2023 1427   GFRNONAA >60 06/02/2023 0939   GFRAA >60 02/19/2020 1356    BNP    Component Value Date/Time   BNP 68.5 02/19/2020 1414    ProBNP No results found for: "PROBNP"  Imaging: No results found.   Assessment & Plan:   No problem-specific Assessment & Plan notes found for this encounter.     Antonio Baumgarten, NP 03/03/2024

## 2024-03-04 ENCOUNTER — Ambulatory Visit (INDEPENDENT_AMBULATORY_CARE_PROVIDER_SITE_OTHER): Admitting: Primary Care

## 2024-03-04 ENCOUNTER — Encounter: Payer: Self-pay | Admitting: Primary Care

## 2024-03-04 VITALS — BP 144/87 | HR 63 | Temp 98.1°F | Ht 69.0 in | Wt 290.0 lb

## 2024-03-04 DIAGNOSIS — Z9981 Dependence on supplemental oxygen: Secondary | ICD-10-CM | POA: Diagnosis not present

## 2024-03-04 DIAGNOSIS — F17210 Nicotine dependence, cigarettes, uncomplicated: Secondary | ICD-10-CM

## 2024-03-04 DIAGNOSIS — G4733 Obstructive sleep apnea (adult) (pediatric): Secondary | ICD-10-CM | POA: Diagnosis not present

## 2024-03-04 NOTE — Patient Instructions (Signed)
 -  OBSTRUCTIVE SLEEP APNEA: Obstructive sleep apnea is a condition where your airway becomes blocked during sleep, causing you to stop breathing temporarily. This can lead to poor sleep quality and other health issues. We will order a split night sleep study to confirm your diagnosis and determine the best settings for a new CPAP or BiPAP machine. We recommend trying a nasal mask instead of a full face mask, as you had difficulty with the latter. You might also benefit from a CPAP pillow designed for side sleepers. We will schedule a follow-up in three months to check on your progress.  INSTRUCTIONS: Please schedule a split night sleep study to reestablish your diagnosis and titrate your CPAP or BiPAP settings. Consider trying a nasal mask and using a CPAP pillow for side sleepers. We will see you again in three months to check on your compliance and progress.  Orders: Split night sleep study  Follow-up 3 months with Beth NP or sooner if needed

## 2024-03-05 ENCOUNTER — Ambulatory Visit: Admitting: Licensed Clinical Social Worker

## 2024-03-18 ENCOUNTER — Ambulatory Visit (INDEPENDENT_AMBULATORY_CARE_PROVIDER_SITE_OTHER): Admitting: Licensed Clinical Social Worker

## 2024-03-18 DIAGNOSIS — F331 Major depressive disorder, recurrent, moderate: Secondary | ICD-10-CM | POA: Diagnosis not present
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
## 2024-04-01 ENCOUNTER — Ambulatory Visit: Admitting: Licensed Clinical Social Worker

## 2024-04-17 ENCOUNTER — Other Ambulatory Visit: Payer: Self-pay | Admitting: Family Medicine

## 2024-04-17 DIAGNOSIS — I639 Cerebral infarction, unspecified: Secondary | ICD-10-CM

## 2024-04-17 DIAGNOSIS — E782 Mixed hyperlipidemia: Secondary | ICD-10-CM

## 2024-04-28 ENCOUNTER — Ambulatory Visit (INDEPENDENT_AMBULATORY_CARE_PROVIDER_SITE_OTHER): Admitting: Audiology

## 2024-04-28 ENCOUNTER — Ambulatory Visit (INDEPENDENT_AMBULATORY_CARE_PROVIDER_SITE_OTHER): Admitting: Otolaryngology

## 2024-04-28 ENCOUNTER — Encounter (INDEPENDENT_AMBULATORY_CARE_PROVIDER_SITE_OTHER): Payer: Self-pay | Admitting: Otolaryngology

## 2024-04-28 VITALS — BP 149/88 | HR 74 | Ht 69.0 in | Wt 300.0 lb

## 2024-04-28 DIAGNOSIS — J343 Hypertrophy of nasal turbinates: Secondary | ICD-10-CM | POA: Diagnosis not present

## 2024-04-28 DIAGNOSIS — J310 Chronic rhinitis: Secondary | ICD-10-CM | POA: Diagnosis not present

## 2024-04-28 DIAGNOSIS — H9072 Mixed conductive and sensorineural hearing loss, unilateral, left ear, with unrestricted hearing on the contralateral side: Secondary | ICD-10-CM | POA: Diagnosis not present

## 2024-04-28 DIAGNOSIS — J342 Deviated nasal septum: Secondary | ICD-10-CM | POA: Insufficient documentation

## 2024-04-28 DIAGNOSIS — H6982 Other specified disorders of Eustachian tube, left ear: Secondary | ICD-10-CM | POA: Diagnosis not present

## 2024-04-28 DIAGNOSIS — F17210 Nicotine dependence, cigarettes, uncomplicated: Secondary | ICD-10-CM | POA: Diagnosis not present

## 2024-04-28 DIAGNOSIS — H9012 Conductive hearing loss, unilateral, left ear, with unrestricted hearing on the contralateral side: Secondary | ICD-10-CM | POA: Diagnosis not present

## 2024-04-28 NOTE — Progress Notes (Signed)
  [address] [place], [place] [address] [phone]  Audiological Evaluation    Name: Brandon [name]     DOB:   1970/11/21      MRN:   [id number]                                                                                     Service Date: 04/28/2024     Accompanied by: unaccompanied   Patient comes today after Dr. Karis, ENT sent a referral for a hearing evaluation due to concerns with recurrent ear infections.   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 Left ear:  Abnormal eardrum appearance.  Tympanometry: Right ear: Type A- Normal external ear canal volume with normal middle ear pressure and tympanic membrane compliance. Left ear: Type B- Normal external ear canal volume with no middle ear pressure peak or tympanic membrane compliance.    Pure tone Audiometry: Right ear- Normal hearing from [phone].    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  Speech Audiometry: Right ear- Speech Reception Threshold (SRT) was obtained at 20 dBHL. Left ear-Speech Reception Threshold (SRT) was obtained at 65 dBHL, with contralateral masking.   Word Recognition Score Tested using NU-6 (recorded) Right ear: 100% was obtained at a presentation level of 65 dBHL with contralateral masking which is deemed as  excellent. Left ear: 100% was obtained at a presentation level of 95 dBHL with contralateral masking which is deemed as  excellent.   The hearing test results were completed under headphones and results are deemed to be of good reliability. Test technique:   conventional      Recommendations: Follow up with ENT as scheduled for today. Repeat audiogram after medical care.   Christyl Osentoski MARIE LEROUX-MARTINEZ, AUD

## 2024-04-28 NOTE — Progress Notes (Signed)
 CC: Left ear hearing loss  HPI:  Brandon [name] is a 53 y.o. male who presents today complaining of left ear hearing loss. - Hearing loss primarily in the left ear has been gradually worsening for approximately 30 years, since 1991 or 1992. The hearing loss is described as severe. There is also a slight hearing loss in the right ear. - There are no current complaints of ear pain, drainage, or infection. - As a child, there were ear issues, possibly childhood ear infections, but no known history of ear tube placement or ear surgery. - There is no history of allergies. - Never worn hearing aids.  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, due to reduced neck mobility   Heart attack (HCC)    History of noncompliance with medical treatment    Due to financial considerations   Hyperlipidemia    Hypertension    Stroke [facility])    Tobacco abuse    40 pack years    Past Surgical History:  Procedure Laterality Date   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   Procedure: BYPASS GRAFT CAROTID-SUBCLAVIAN USING HEMASHIELD GOLD GRAFT;  Surgeon: Harvey Carlin FORBES, MD;  Location: [facility] OR;  Service: Vascular;  Laterality: Left;   COLONOSCOPY N/A 02/18/2024   Procedure: COLONOSCOPY;  Surgeon: San Sandor GAILS, DO;  Location: [facility]COPY;  Service: Gastroenterology;  Laterality: N/A;   COLONOSCOPY WITH PROPOFOL  N/A 09/21/2020   Procedure: COLONOSCOPY WITH PROPOFOL ;  Surgeon: San Sandor GAILS, DO;  Location: [facility];   Service: Gastroenterology;  Laterality: N/A;   CORONARY ANGIOPLASTY WITH STENT PLACEMENT     HEMOSTASIS CLIP PLACEMENT  09/21/2020   Procedure: HEMOSTASIS CLIP PLACEMENT;  Surgeon: San Sandor GAILS, DO;  Location: [facility];  Service: Gastroenterology;;   IR ANGIO EXTERNAL CAROTID SEL EXT CAROTID BILAT MOD SED  12/15/2019   IR ANGIO VERTEBRAL SEL VERTEBRAL UNI R MOD SED  12/15/2019   IR ANGIOGRAM EXTREMITY LEFT  12/15/2019   IR ANGIOGRAM EXTREMITY LEFT  12/15/2019   IR CT HEAD LTD  12/15/2019   IR PERCUTANEOUS ART THROMBECTOMY/INFUSION INTRACRANIAL INC DIAG ANGIO  12/15/2019   POLYPECTOMY  09/21/2020   Procedure: POLYPECTOMY;  Surgeon: San Sandor GAILS, DO;  Location: [facility];  Service: Gastroenterology;;   POLYPECTOMY  02/18/2024   Procedure: POLYPECTOMY, INTESTINE;  Surgeon: San Sandor GAILS, DO;  Location: [facility];  Service: Gastroenterology;;   RADIOLOGY WITH ANESTHESIA N/A 12/15/2019   Procedure: IR WITH ANESTHESIA;  Surgeon: Dolphus Carrion, MD;  Location: [facility] OR;  Service: Radiology;  Laterality: N/A;    Family History  Problem Relation Age of Onset   Depression Mother 68       Suicide   Heart disease Father 67       Deceased from massive heart-attack   Hyperlipidemia Father    Hypertension Father    COPD Maternal Grandfather    Cancer Paternal Grandmother        Male Cancer   Heart disease Paternal Grandmother    Colon cancer Neg Hx    Pancreatic cancer Neg Hx  Esophageal cancer Neg Hx     Social History:  reports that he has been smoking cigarettes. He has a 34 pack-year smoking history. He has never used smokeless tobacco. He reports that he does not drink alcohol and does not use drugs.  Allergies: No Known Allergies  Prior to Admission medications   Medication Sig Start Date End Date Taking? Authorizing Provider  aspirin  EC 81 MG tablet Take 1 tablet (81 mg total) by mouth daily. Swallow whole. 05/31/21  Yes Court Dorn PARAS, MD  atorvastatin   (LIPITOR ) 80 MG tablet Take 1 tablet by mouth once daily 10/03/23  Yes Court Dorn PARAS, MD  citalopram  (CELEXA ) 20 MG tablet Take 1 tablet (20 mg total) by mouth daily. 01/23/24  Yes Joyce Norleen BROCKS, MD  Evolocumab  (REPATHA  SURECLICK) 140 MG/ML SOAJ INJECT 1 PEN  SUBCUTANEOUSLY EVERY 14 DAYS 02/15/24  Yes Court Dorn PARAS, MD  ezetimibe  (ZETIA ) 10 MG tablet Take 1 tablet by mouth once daily 04/17/24  Yes Lalonde, John C, MD  metoprolol  succinate (TOPROL -XL) 50 MG 24 hr tablet Take 50 mg by mouth daily. 01/18/24  Yes [provider]    Blood pressure (!) 149/88, pulse 74, height 5' 9 (1.753 m), weight 300 lb (136.1 kg), SpO2 93%. Exam: General: Communicates without difficulty, well nourished, no acute distress. Head: Normocephalic, no evidence injury, no tenderness, facial buttresses intact without stepoff. Face/sinus: No tenderness to palpation and percussion. Facial movement is normal and symmetric. Eyes: PERRL, EOMI. No scleral icterus, conjunctivae clear. Neuro: CN II exam reveals vision grossly intact.  No nystagmus at any point of gaze. Ears: Auricles well formed without lesions.  Ear canals are intact without mass or lesion.  No erythema or edema is appreciated.  The left tympanic membrane is severely retracted.  The right tympanic membrane is normal.  Nose: External evaluation reveals normal support and skin without lesions.  Dorsum is intact.  Anterior rhinoscopy reveals congested mucosa over anterior aspect of inferior turbinates and intact septum.  No purulence noted. Oral:  Oral cavity and oropharynx are intact, symmetric, without erythema or edema.  Mucosa is moist without lesions. Neck: Full range of motion without pain.  There is no significant lymphadenopathy.  No masses palpable.  Thyroid bed within normal limits to palpation.  Parotid glands and submandibular glands equal bilaterally without mass.  Trachea is midline. Neuro:  CN 2-12 grossly intact.   Procedure:  Flexible Nasal  Endoscopy: Description: Risks, benefits, and alternatives of flexible endoscopy were explained to the patient.  Specific mention was made of the risk of throat numbness with difficulty swallowing, possible bleeding from the nose and mouth, and pain from the procedure.  The patient gave oral consent to proceed.  The flexible scope was inserted into the right nasal cavity.  Endoscopy of the interior nasal cavity, superior, inferior, and middle meatus was performed. The sphenoid-ethmoid recess was examined. Edematous mucosa was noted.  No polyp, mass, or lesion was appreciated. Nasal septal deviation noted. Olfactory cleft was clear.  Nasopharynx was clear.  Turbinates were hypertrophied but without mass.  The procedure was repeated on the contralateral side with similar findings.  The patient tolerated the procedure well.   His hearing test shows significant left ear conductive hearing loss.  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 - Chronic left eustachian tube dysfunction   Plan: - The physical exam and nasal endoscopy findings are reviewed  with the patient. -The hearing test results also reviewed. - Consideration of a hearing aid for the left ear was recommended. A copy of the existing hearing test results will be provided for this purpose. - Flonase nasal spray daily is recommended, though it is not expected to improve the ear condition due to the irreversible floppiness of the eardrum. - Surgical repair of the eardrum was considered and discussed. - Follow-up in one year for a check-up to ensure there are no new issues like pain or drainage, with instructions to call and return sooner if such problems arise.  Melrose Kearse W Maayan Jenning 04/28/2024, 9:24 PM

## 2024-05-12 ENCOUNTER — Observation Stay (HOSPITAL_COMMUNITY)
Admission: EM | Admit: 2024-05-12 | Discharge: 2024-05-13 | Disposition: A | Discharge status: Home / Self Care | Attending: Family Medicine | Admitting: Family Medicine

## 2024-05-12 ENCOUNTER — Emergency Department (HOSPITAL_COMMUNITY)

## 2024-05-12 ENCOUNTER — Other Ambulatory Visit: Payer: Self-pay

## 2024-05-12 ENCOUNTER — Encounter (HOSPITAL_COMMUNITY): Payer: Self-pay | Admitting: Emergency Medicine

## 2024-05-12 DIAGNOSIS — F325 Major depressive disorder, single episode, in full remission: Secondary | ICD-10-CM | POA: Diagnosis not present

## 2024-05-12 DIAGNOSIS — Z79899 Other long term (current) drug therapy: Secondary | ICD-10-CM | POA: Insufficient documentation

## 2024-05-12 DIAGNOSIS — I672 Cerebral atherosclerosis: Secondary | ICD-10-CM | POA: Diagnosis not present

## 2024-05-12 DIAGNOSIS — I639 Cerebral infarction, unspecified: Secondary | ICD-10-CM | POA: Diagnosis not present

## 2024-05-12 DIAGNOSIS — Z720 Tobacco use: Secondary | ICD-10-CM | POA: Diagnosis present

## 2024-05-12 DIAGNOSIS — F17210 Nicotine dependence, cigarettes, uncomplicated: Secondary | ICD-10-CM | POA: Insufficient documentation

## 2024-05-12 DIAGNOSIS — I6602 Occlusion and stenosis of left middle cerebral artery: Secondary | ICD-10-CM | POA: Diagnosis not present

## 2024-05-12 DIAGNOSIS — I6522 Occlusion and stenosis of left carotid artery: Secondary | ICD-10-CM | POA: Insufficient documentation

## 2024-05-12 DIAGNOSIS — R29818 Other symptoms and signs involving the nervous system: Secondary | ICD-10-CM | POA: Insufficient documentation

## 2024-05-12 DIAGNOSIS — R4701 Aphasia: Secondary | ICD-10-CM | POA: Diagnosis not present

## 2024-05-12 DIAGNOSIS — Z7982 Long term (current) use of aspirin: Secondary | ICD-10-CM | POA: Diagnosis not present

## 2024-05-12 DIAGNOSIS — Z8673 Personal history of transient ischemic attack (TIA), and cerebral infarction without residual deficits: Secondary | ICD-10-CM | POA: Diagnosis not present

## 2024-05-12 DIAGNOSIS — R29705 NIHSS score 5: Secondary | ICD-10-CM

## 2024-05-12 DIAGNOSIS — G4730 Sleep apnea, unspecified: Secondary | ICD-10-CM | POA: Insufficient documentation

## 2024-05-12 DIAGNOSIS — I6523 Occlusion and stenosis of bilateral carotid arteries: Secondary | ICD-10-CM | POA: Diagnosis not present

## 2024-05-12 DIAGNOSIS — F17200 Nicotine dependence, unspecified, uncomplicated: Secondary | ICD-10-CM | POA: Insufficient documentation

## 2024-05-12 DIAGNOSIS — I10 Essential (primary) hypertension: Secondary | ICD-10-CM | POA: Diagnosis not present

## 2024-05-12 DIAGNOSIS — Z6841 Body Mass Index (BMI) 40.0 and over, adult: Secondary | ICD-10-CM | POA: Insufficient documentation

## 2024-05-12 DIAGNOSIS — I6502 Occlusion and stenosis of left vertebral artery: Secondary | ICD-10-CM | POA: Diagnosis not present

## 2024-05-12 DIAGNOSIS — R4781 Slurred speech: Secondary | ICD-10-CM | POA: Diagnosis not present

## 2024-05-12 DIAGNOSIS — R29810 Facial weakness: Secondary | ICD-10-CM | POA: Diagnosis not present

## 2024-05-12 LAB — COMPREHENSIVE METABOLIC PANEL WITH GFR
ALT: 21 U/L (ref 0–44)
AST: 20 U/L (ref 15–41)
Albumin: 3.5 g/dL (ref 3.5–5.0)
Alkaline Phosphatase: 63 U/L (ref 38–126)
Anion gap: 9 (ref 5–15)
BUN: 15 mg/dL (ref 6–20)
CO2: 25 mmol/L (ref 22–32)
Calcium: 9.2 mg/dL (ref 8.9–10.3)
Chloride: 103 mmol/L (ref 98–111)
Creatinine, Ser: 0.96 mg/dL (ref 0.61–1.24)
GFR, Estimated: >60 mL/min (ref >60)
Glucose, Bld: 108 mg/dL — ABNORMAL HIGH (ref 70–99)
Potassium: 3.4 mmol/L — ABNORMAL LOW (ref 3.5–5.1)
Sodium: 137 mmol/L (ref 135–145)
Total Bilirubin: 0.8 mg/dL (ref 0.0–1.2)
Total Protein: 6.4 g/dL — ABNORMAL LOW (ref 6.5–8.1)

## 2024-05-12 LAB — DIFFERENTIAL
Abs Immature Granulocytes: 0.01 K/uL (ref 0.00–0.07)
Basophils Absolute: 0.1 K/uL (ref 0.0–0.1)
Basophils Relative: 2 %
Eosinophils Absolute: 0.3 K/uL (ref 0.0–0.5)
Eosinophils Relative: 4 %
Immature Granulocytes: 0 %
Lymphocytes Relative: 39 %
Lymphs Abs: 2.9 K/uL (ref 0.7–4.0)
Monocytes Absolute: 0.5 K/uL (ref 0.1–1.0)
Monocytes Relative: 7 %
Neutro Abs: 3.6 K/uL (ref 1.7–7.7)
Neutrophils Relative %: 48 %

## 2024-05-12 LAB — URINALYSIS, ROUTINE W REFLEX MICROSCOPIC
Bilirubin Urine: NEGATIVE
Glucose, UA: NEGATIVE mg/dL
Hgb urine dipstick: NEGATIVE
Ketones, ur: NEGATIVE mg/dL
Leukocytes,Ua: NEGATIVE
Nitrite: NEGATIVE
Protein, ur: NEGATIVE mg/dL
Specific Gravity, Urine: 1.033 — ABNORMAL HIGH (ref 1.005–1.030)
pH: 6 (ref 5.0–8.0)

## 2024-05-12 LAB — CBC
HCT: 45.3 % (ref 39.0–52.0)
Hemoglobin: 15.7 g/dL (ref 13.0–17.0)
MCH: 32.1 pg (ref 26.0–34.0)
MCHC: 34.7 g/dL (ref 30.0–36.0)
MCV: 92.6 fL (ref 80.0–100.0)
Platelets: 153 K/uL (ref 150–400)
RBC: 4.89 MIL/uL (ref 4.22–5.81)
RDW: 13.8 % (ref 11.5–15.5)
WBC: 7.5 K/uL (ref 4.0–10.5)
nRBC: 0 % (ref 0.0–0.2)

## 2024-05-12 LAB — RAPID URINE DRUG SCREEN, HOSP PERFORMED
Amphetamines: NOT DETECTED
Barbiturates: NOT DETECTED
Benzodiazepines: NOT DETECTED
Cocaine: NOT DETECTED
Opiates: NOT DETECTED
Tetrahydrocannabinol: NOT DETECTED

## 2024-05-12 LAB — APTT: aPTT: 27 s (ref 24–36)

## 2024-05-12 LAB — PROTIME-INR
INR: 1 (ref 0.8–1.2)
Prothrombin Time: 14.1 s (ref 11.4–15.2)

## 2024-05-12 LAB — TROPONIN I (HIGH SENSITIVITY)
Troponin I (High Sensitivity): 7 ng/L (ref <18)
Troponin I (High Sensitivity): 8 ng/L (ref <18)

## 2024-05-12 LAB — CBG MONITORING, ED: Glucose-Capillary: 120 mg/dL — ABNORMAL HIGH (ref 70–99)

## 2024-05-12 LAB — ETHANOL: Alcohol, Ethyl (B): <15 mg/dL (ref <15)

## 2024-05-12 MED ORDER — ASPIRIN 325 MG PO TBEC
325.0000 mg | DELAYED_RELEASE_TABLET | Freq: Every day | ORAL | Status: DC
  Administered 2024-05-12 – 2024-05-13 (×2): 325 mg via ORAL
  Filled 2024-05-12 (×2): qty 1

## 2024-05-12 MED ORDER — POLYETHYLENE GLYCOL 3350 17 G PO PACK: 17.0000 g | PACK | Freq: Every day | ORAL | Status: DC | PRN

## 2024-05-12 MED ORDER — ASPIRIN 300 MG RE SUPP
300.0000 mg | Freq: Every day | RECTAL | Status: DC
  Filled 2024-05-12: qty 1

## 2024-05-12 MED ORDER — HYDRALAZINE HCL 20 MG/ML IJ SOLN: 10.0000 mg | INTRAMUSCULAR | Status: DC | PRN

## 2024-05-12 MED ORDER — ONDANSETRON HCL 4 MG/2ML IJ SOLN: 4.0000 mg | Freq: Four times a day (QID) | INTRAMUSCULAR | Status: DC | PRN

## 2024-05-12 MED ORDER — NICOTINE 14 MG/24HR TD PT24
14.0000 mg | MEDICATED_PATCH | Freq: Every day | TRANSDERMAL | Status: DC
  Administered 2024-05-13 (×2): 14 mg via TRANSDERMAL
  Filled 2024-05-12 (×2): qty 1

## 2024-05-12 MED ORDER — ACETAMINOPHEN 325 MG PO TABS
650.0000 mg | ORAL_TABLET | Freq: Four times a day (QID) | ORAL | Status: DC | PRN
Start: 2024-05-12 — End: 2024-05-13

## 2024-05-12 MED ORDER — ONDANSETRON HCL 4 MG PO TABS: 4.0000 mg | ORAL_TABLET | Freq: Four times a day (QID) | ORAL | Status: DC | PRN

## 2024-05-12 MED ORDER — IOHEXOL 350 MG/ML SOLN
100.0000 mL | Freq: Once | INTRAVENOUS | Status: AC | PRN
  Administered 2024-05-12: 100 mL via INTRAVENOUS

## 2024-05-12 MED ORDER — ACETAMINOPHEN 650 MG RE SUPP: 650.0000 mg | Freq: Four times a day (QID) | RECTAL | Status: DC | PRN

## 2024-05-12 MED ORDER — NICOTINE 14 MG/24HR TD PT24: 14.0000 mg | MEDICATED_PATCH | Freq: Every day | TRANSDERMAL | Status: DC

## 2024-05-12 MED ORDER — SODIUM CHLORIDE 0.9 % IV SOLN: INTRAVENOUS | Status: DC

## 2024-05-12 MED ORDER — STROKE: EARLY STAGES OF RECOVERY BOOK
Freq: Once | Status: AC
  Filled 2024-05-12: qty 1

## 2024-05-12 MED ORDER — SODIUM CHLORIDE 0.9 % IV BOLUS
500.0000 mL | Freq: Once | INTRAVENOUS | Status: AC
  Administered 2024-05-12: 500 mL via INTRAVENOUS

## 2024-05-12 MED ORDER — POTASSIUM CHLORIDE CRYS ER 20 MEQ PO TBCR
40.0000 meq | EXTENDED_RELEASE_TABLET | Freq: Once | ORAL | Status: AC
  Administered 2024-05-13: 40 meq via ORAL
  Filled 2024-05-12: qty 2

## 2024-05-12 NOTE — ED Provider Notes (Signed)
  EMERGENCY DEPARTMENT AT [facility] Provider Note   CSN: [id number] Arrival date & time: 05/12/24  1735  An emergency department physician performed an initial assessment on this suspected stroke patient at 98.  Patient presents with: Stroke Symptoms   Brandon [name] is a 53 y.o. male.   Patient is a 53 year old male who presents to the Emergency Department with a chief complaint of aphasia and right-sided facial droop.  Patient notes that he believes that the symptoms began approximately 5 PM but no one was with him in the last time and the family saw him was at approximately 1 PM.  Patient notes he does have a history of previous CVAs.  He denies any active pain at this time to include headaches, pain to neck or back, chest pain, abdominal pain.  He has had no associated shortness of breath.  He presents moving all 4 extremities without difficulty with no other focal weakness.  There is been no vision or hearing changes.        Prior to Admission medications   Medication Sig Start Date End Date Taking? Authorizing Provider  aspirin  EC 81 MG tablet Take 1 tablet (81 mg total) by mouth daily. Swallow whole. 05/31/21   Court Dorn PARAS, MD  atorvastatin  (LIPITOR ) 80 MG tablet Take 1 tablet by mouth once daily 10/03/23   Court Dorn PARAS, MD  citalopram  (CELEXA ) 20 MG tablet Take 1 tablet (20 mg total) by mouth daily. 01/23/24   Joyce Norleen BROCKS, MD  Evolocumab  (REPATHA  SURECLICK) 140 MG/ML SOAJ INJECT 1 PEN  SUBCUTANEOUSLY EVERY 14 DAYS 02/15/24   Court Dorn PARAS, MD  ezetimibe  (ZETIA ) 10 MG tablet Take 1 tablet by mouth once daily 04/17/24   Lalonde, John C, MD  metoprolol  succinate (TOPROL -XL) 50 MG 24 hr tablet Take 50 mg by mouth daily. 01/18/24   [provider]    Allergies: Patient has no known allergies.    Review of Systems  Neurological:        Expressive aphasia  All other systems reviewed and are negative.   Updated Vital Signs BP (!)  192/91   Pulse 69   Temp 98.1 F (36.7 C) (Oral)   Resp 15   Ht 5' 9 (1.753 m)   Wt (!) 136.1 kg   SpO2 96%   BMI 44.31 kg/m   Physical Exam Vitals and nursing note reviewed.  Constitutional:      Appearance: Normal appearance.  HENT:     Head: Normocephalic and atraumatic.     Nose: Nose normal.     Mouth/Throat:     Mouth: Mucous membranes are moist.  Eyes:     Extraocular Movements: Extraocular movements intact.     Conjunctiva/sclera: Conjunctivae normal.     Pupils: Pupils are equal, round, and reactive to light.  Cardiovascular:     Rate and Rhythm: Normal rate and regular rhythm.     Pulses: Normal pulses.     Heart sounds: Normal heart sounds. No murmur heard.    No gallop.  Pulmonary:     Effort: Pulmonary effort is normal. No respiratory distress.     Breath sounds: Normal breath sounds. No stridor. No wheezing, rhonchi or rales.  Abdominal:     General: Abdomen is flat. Bowel sounds are normal. There is no distension.     Palpations: Abdomen is soft.     Tenderness: There is no abdominal tenderness. There is no guarding.  Musculoskeletal:  General: Normal range of motion.     Cervical back: Normal range of motion and neck supple. No rigidity or tenderness.  Skin:    General: Skin is warm and dry.  Neurological:     Mental Status: He is alert.     Comments: Expressive aphasia, right-sided facial droop, strength 5 out of 5 in bilateral upper and lower extremities, finger-to-nose intact, normal rapid altering movements, normal heel-to-shin, decree sensation to right upper and lower extremity, sensation intact to left upper and lower extremity, no visual field deficits, unable to assess gait  Psychiatric:        Mood and Affect: Mood normal.        Behavior: Behavior normal.        Thought Content: Thought content normal.        Judgment: Judgment normal.     (all labs ordered are listed, but only abnormal results are displayed) Labs Reviewed   COMPREHENSIVE METABOLIC PANEL WITH GFR - Abnormal; Notable for the following components:      Result Value   Potassium 3.4 (*)    Glucose, Bld 108 (*)    Total Protein 6.4 (*)    All other components within normal limits  URINALYSIS, ROUTINE W REFLEX MICROSCOPIC - Abnormal; Notable for the following components:   Specific Gravity, Urine 1.033 (*)    All other components within normal limits  CBG MONITORING, ED - Abnormal; Notable for the following components:   Glucose-Capillary 120 (*)    All other components within normal limits  ETHANOL  PROTIME-INR  APTT  CBC  DIFFERENTIAL  RAPID URINE DRUG SCREEN, HOSP PERFORMED  LIPID PANEL  HEMOGLOBIN A1C  TROPONIN I (HIGH SENSITIVITY)  TROPONIN I (HIGH SENSITIVITY)    EKG: EKG Interpretation Date/Time:  Monday May 12 2024 18:11:17 EDT Ventricular Rate:  71 PR Interval:  183 QRS Duration:  131 QT Interval:  418 QTC Calculation: 455 R Axis:   0  Text Interpretation: Sinus rhythm Consider left atrial enlargement Nonspecific intraventricular conduction delay Inferior infarct, old Confirmed by Francesca Fallow ([id number]) on 05/12/2024 7:07:39 PM  Radiology: CT ANGIO HEAD NECK W WO CM W PERF (CODE STROKE) Addendum Date: 05/12/2024 ADDENDUM REPORT: 05/12/2024 18:56 ADDENDUM: 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 Daralene, who verbally acknowledged these results. Electronically Signed   By: Donnice Mania M.D.   On: 05/12/2024 18:56   Result Date: 05/12/2024 CLINICAL DATA:  Code stroke, neuro deficit, memory issues, slurred speech, right-sided facial droop. EXAM: CT ANGIOGRAPHY HEAD AND NECK CT PERFUSION BRAIN TECHNIQUE: Multidetector CT imaging of the head and neck was performed using the standard protocol during bolus administration of intravenous contrast. Multiplanar CT image reconstructions and MIPs were  obtained to evaluate the vascular anatomy. Carotid stenosis measurements (when applicable) are obtained utilizing NASCET criteria, using the distal internal carotid diameter as the denominator. Multiphase CT imaging of the brain was performed following IV bolus contrast injection. Subsequent parametric perfusion maps were calculated using RAPID software. RADIATION DOSE REDUCTION: This exam was performed according to the departmental dose-optimization program which includes automated exposure control, adjustment of the mA and/or kV according to patient size and/or use of iterative reconstruction technique. CONTRAST:  OMNIPAQUE  IOHEXOL  350 MG/ML SOLN COMPARISON:  Same-day head CT.  CTA head 12/14/2019. FINDINGS: CTA NECK FINDINGS Aortic arch: Standard configuration of the aortic arch. Imaged  portion shows no evidence of aneurysm or dissection. Mild atherosclerosis of the partially visualized aortic arch. Pulmonary arteries: Not well visualized. Subclavian arteries: The right subclavian artery is patent proximally. There is occlusion of the proximal left subclavian artery proximal to the origin of the left vertebral artery. Mild atherosclerosis noted. Right carotid system: Patent from the origin to the skull base. Calcified and noncalcified atherosclerotic plaque at the carotid bifurcation resulting in approximately 50% stenosis. Left carotid system: Patent from the origin to the distal common carotid artery. At the carotid bifurcation there is prominent calcified and noncalcified atherosclerotic plaque with occlusion of the internal carotid artery at its origin. The external carotid artery branches are patent. Vertebral arteries: Codominant. No evidence of dissection, stenosis (50% or greater), or occlusion. Skeleton: No acute or aggressive finding noted. Other neck: The visualized airway is patent. No cervical lymphadenopathy. Upper chest: Visualized lung apices are clear. Review of the MIP images confirms the  above findings CTA HEAD FINDINGS ANTERIOR CIRCULATION: The internal carotid artery is occluded from the cervical segment to the paraclinoid segment. Reconstitution at the clinoid segment with the vessel remains patent to the ICA terminus. The right ICA is patent from the cervical segment to the ICA terminus. Atherosclerosis throughout the right ICA with moderate stenosis of the paraclinoid segment. MCAs: The left M1 segment is patent proximally. There is abrupt occlusion of a proximal M2 branch of the left MCA. Dominant left M2 inferior division branch appears patent. There are additional M2 superior vision branches of the left MCA which appear occluded more distally. The right MCA is patent. ACAs: The anterior cerebral arteries are patent bilaterally. POSTERIOR CIRCULATION: No significant stenosis, proximal occlusion, aneurysm, or vascular malformation. PCAs: Patent bilaterally. Severe stenosis of the P1 segment of the left PCA. Pcomm: Not well visualized. SCAs: The superior cerebellar arteries are patent bilaterally. Basilar artery: Patent AICAs: Not well visualized. PICAs: Patent Vertebral arteries: The intracranial vertebral arteries are patent. Venous sinuses: As permitted by contrast timing, patent. Anatomic variants: None Review of the MIP images confirms the above findings CT Brain Perfusion Findings: ASPECTS: 10 CBF (<30%) Volume: 6mL Perfusion (Tmax>6.0s) volume: Mismatch Volume: Infarction Location:Left frontal operculum within the anterior aspect of the left MCA territory. There are additional surrounding areas of elevated T-max throughout the left MCA territory most pronounced within the anterior aspect of the left frontal operculum. IMPRESSION: Occlusion of a proximal M2 branch of the left MCA. Multiple additional M2 superior division branches are occluded more distally. Core infarct within the left frontal operculum within the anterior aspect of the left MCA territory. Significant region  of surrounding elevated T-max concerning for at-risk tissue. 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 Intracranial atherosclerosis as above. Moderate stenosis of the right paraclinoid ICA. Severe stenosis and possible short segment occlusion of the P1 segment left PCA. Atherosclerosis at the right carotid bifurcation resulting in approximately 50% stenosis. 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 Aortic Atherosclerosis (ICD10-I70.0). These results were communicated to D. Wolfe At 6:15 pm on 05/12/2024 by text page via the [facility] messaging system. Electronically Signed: By: Donnice Mania M.D. On: 05/12/2024 18:32   CT HEAD CODE STROKE WO CONTRAST Result Date: 05/12/2024 CLINICAL DATA:  Code stroke. Neuro deficit, concern for stroke, memory issues, slurred speech, right-sided facial droop. EXAM: CT HEAD WITHOUT CONTRAST TECHNIQUE: Contiguous  axial images were obtained from the base of the skull through the vertex without intravenous contrast. RADIATION DOSE REDUCTION: This exam was performed according to the departmental dose-optimization program which includes automated exposure control, adjustment of the mA and/or kV according to patient size and/or use of iterative reconstruction technique. COMPARISON:  CT head 12/15/2019. FINDINGS: Brain: No acute intracranial hemorrhage. Encephalomalacia in the left parietal lobe suggestive of remote infarct. No significant edema, mass effect, or midline shift. The basilar cisterns are patent. Posterior fossa is unremarkable. No extra-axial fluid collections. Ventricles: The ventricles are normal. Vascular: Abnormal hyperattenuation along the distal M1/proximal M2 segments of the left MCA. No unexpected calcification. Skull: No acute or aggressive finding. Orbits: Orbits are symmetric.  Sinuses: The visualized paranasal sinuses are clear. Other: Mastoid air cells are clear. ASPECTS [facility] Stroke Program Early CT Score) - Ganglionic level infarction (caudate, lentiform nuclei, internal capsule, insula, M1-M3 cortex): 7 - Supraganglionic infarction (M4-M6 cortex): 3 Total score (0-10 with 10 being normal): 10 IMPRESSION: 1. No acute intracranial hemorrhage. 2. Abnormal hyperattenuation along the distal M1 and proximal M2 segment of the left MCA concerning for thrombus. 3. ASPECTS is 10 These results were communicated to Dr. Jerri at 6:12 pm on 05/12/2024 by text page via the [facility] messaging system. Electronically Signed   By: Donnice Mania M.D.   On: 05/12/2024 18:13     .Critical Care  Performed by: Daralene Lonni BIRCH, PA-C Authorized by: Daralene Lonni BIRCH, PA-C   Critical care provider statement:    Critical care time (minutes):  35   Critical care was necessary to treat or prevent imminent or life-threatening deterioration of the following conditions: CVA.   Critical care was time spent personally by me on the following activities:  Development of treatment plan with patient or surrogate, discussions with consultants, evaluation of patient's response to treatment, examination of patient, ordering and review of laboratory studies, ordering and review of radiographic studies, ordering and performing treatments and interventions, pulse oximetry, re-evaluation of patient's condition and review of old charts   I assumed direction of critical care for this patient from another provider in my specialty: no     Care discussed with: admitting provider      Medications Ordered in the ED  sodium chloride  0.9 % bolus 500 mL (0 mLs Intravenous Stopped 05/12/24 1830)    Followed by  0.9 %  sodium chloride  infusion ( Intravenous New Bag/Given 05/12/24 1830)  aspirin  suppository 300 mg ( Rectal See Alternative 05/12/24 1956)    Or  aspirin  EC tablet 325 mg (325 mg Oral Given 05/12/24 1956)    stroke: early stages of recovery book (has no administration in time range)  iohexol  (OMNIPAQUE ) 350 MG/ML injection 100 mL (100 mLs Intravenous Contrast Given 05/12/24 1800)    Clinical Course as of 05/12/24 1956  Mon May 12, 2024  1801 CT ANGIO HEAD NECK W WO CM W PERF (CODE STROKE) [SS]    Clinical Course User Index [SS] Stotelmyre, Sera, Student-PA                                 Medical Decision Making Amount and/or Complexity of Data Reviewed Labs: ordered. Radiology: ordered.  Risk Prescription drug management. Decision regarding hospitalization.   This patient presents to the ED for concern of aphasia, right-sided facial droop, this involves an extensive number of treatment options, and is a complaint that carries with it a  high risk of complications and morbidity.  The differential diagnosis includes CVA, TIA, space-occupying lesion, meningitis, encephalitis, ACS, electrolyte derangement, heatstroke   Co morbidities that complicate the patient evaluation  Previous CVA   Additional history obtained:  Additional history obtained from family External records from outside source obtained and reviewed including medical records   Lab Tests:  I Ordered, and personally interpreted labs.  The pertinent results include: No leukocytosis, no anemia, mild hypokalemia, normal kidney function liver function, unremarkable urinalysis, negative troponin   Imaging Studies ordered:  I ordered imaging studies including CT scan of head, CTA perfusion I independently visualized and interpreted imaging which showed left ICA and left MCA occlusion I agree with the radiologist interpretation   Cardiac Monitoring: / EKG:  The patient was maintained on a cardiac monitor.  I personally viewed and interpreted the cardiac monitored which showed an underlying rhythm of: Normal sinus rhythm, no ST/T wave changes, no ischemic changes, no STEMI   Consultations Obtained:  I requested  consultation with the neurology, Dr. Jerri,  and discussed lab and imaging findings as well as pertinent plan - they recommend: Admission at [facility], MRI brain, further stroke workup, permissive hypertension   Problem List / ED Course / Critical interventions / Medication management  Patient does remain stable at this time and aphasia has been improving while in the emergency department.  Stroke alert was initially called on presentation and patient was evaluated by Dr. Jerri with neurology.  Neurology noted that is unclear exactly when the patient's symptoms began as he was having some symptoms yesterday and notes that his last known normal by the family was at approximately 1 PM today.  At this time he is not a TNKase candidate per neurology.  He does have findings consistent with ICA and MCA occlusion on the left.  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  Neurology did recommend permissive hypertension, MRI brain and further stroke workup.  This was discussed with the patient and family who have voiced understanding to the plan.  Blood work has otherwise been unremarkable.  Did discuss patient case with Dr. FORBES Carwin with the hospitalist service who has excepted for admission. I ordered medication including IV fluids for CVA, permissive hypertension Reevaluation of the patient after these medicines showed that the patient improved I have reviewed the patients home medicines and have made adjustments as needed   Social Determinants of Health:  None   Test / Admission - Considered:  Admission     Final diagnoses:  Cerebrovascular accident (CVA), unspecified mechanism [facility])    ED Discharge Orders     None          Daralene Lonni JONETTA DEVONNA 05/12/24 2003     Francesca Elsie CROME, MD 05/12/24 2114

## 2024-05-12 NOTE — ED Notes (Signed)
 Pt presenting with expressive aphasia and dysarthria; right facial droop present and decreased sensation on right side starting today around 1630 after mowing his lawn and taking shower (EMS called by roommate). No drifts present, grip strength equal on both sides. Pt step daughter states he forgot where he was at last night at 2100 and was told by roommate that yesterday when mowing her brother's lawn, it was not cut correctly and he only performed half.

## 2024-05-12 NOTE — ED Notes (Addendum)
 SPOK was paged @17 :39

## 2024-05-12 NOTE — H&P (Signed)
 History and Physical    Brandon [name] FMW:[id number] DOB: [birth year] DOA: 05/12/2024  PCP: Joyce Norleen BROCKS, MD   Patient coming from: Home  I have personally briefly reviewed patient's old medical records in [facility]  Chief Complaint: Slurred speech  HPI: Brandon [name] is a 53 y.o. male with medical history significant for BPH, coronary artery disease, OSA, depression, hypertension. Patient presented to the ED with reports of slurred speech and memory impairment.  Per notes - last night patient was driving, and complained to stepdaughter that he had vision changes and is not able to find his way back home.  Daughter tells me now that patient's memory was okay yesterday, it was raining heavily and that was why patient did not know where he was.  Patient symptoms suddenly started at 4.30pm today-per his roommate.  Prior to this he had been okay all day.  Symptoms have improved some since onset today.  Patient has a history of stroke in 2021, for symptoms this time are most severe.  He has been compliant with aspirin .  He still smokes cigarettes.   ED Course: Blood pressure 170s to 202 systolic. CT no acute intracranial hemorrhage, Abnormal hyperattenuation along the distal M1 and proximal M2 segment of the left MCA concerning for thrombus. CTA head and neck- Core infarct within the left frontal operculum within the anterior aspect of the left MCA territory. Significant region of surrounding elevated T-max concerning for at-risk tissue.  See detailed report on occlusions and stenosis. Evaluated by teleneurologist Dr. Leane outside window for intervention, and not IR candidate given chronic left ICA occlusion with unsuccessful attempt 4 years ago.  Recommended MRI brain.  Allow for permissive hypertension.  Review of Systems: As per HPI all other systems reviewed and negative.  Past Medical History:  Diagnosis Date   Arteriosclerotic cardiovascular disease (ASCVD) 2006, 2012    2006-acute IMI treated with urgent RCA stent; 2007-[place] for in-stent restenosis; 02/2010-presented with ACS and minimal troponin elevation:70% LAD, 80% distal circumflex, 80% proximal ramus branch vessel,in-stent restenosis of 70% in the RCA; BMS for proximal critical RCA stenosis, restenosis Nov 2012   Carotid artery occlusion    Difficult airway for intubation    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, due to reduced neck mobility   Heart attack (HCC)    History of noncompliance with medical treatment    Due to financial considerations   Hyperlipidemia    Hypertension    Stroke [facility])    Tobacco abuse    40 pack years    Past Surgical History:  Procedure Laterality Date   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   Procedure: BYPASS GRAFT CAROTID-SUBCLAVIAN USING HEMASHIELD GOLD GRAFT;  Surgeon: Harvey Carlin FORBES, MD;  Location[facility] OR;  Service: Vascular;  Laterality: Left;   COLONOSCOPY N/A 02/18/2024   Procedure: COLONOSCOPY;  Surgeon: San Sandor GAILS, DO;  Location: [facility] ENDOSCOPY;  Service: Gastroenterology;  Laterality: N/A;   COLONOSCOPY WITH PROPOFOL  N/A 09/21/2020   Procedure: COLONOSCOPY WITH PROPOFOL ;  Surgeon: San Sandor GAILS, DO;  Location: [facility] ENDOSCOPY;  Service: Gastroenterology;  Laterality: N/A;   CORONARY ANGIOPLASTY WITH STENT PLACEMENT     HEMOSTASIS CLIP PLACEMENT  09/21/2020   Procedure: HEMOSTASIS CLIP PLACEMENT;  Surgeon: San Sandor GAILS, DO;  Location: [facility] ENDOSCOPY;  Service: Gastroenterology;;   IR ANGIO EXTERNAL CAROTID SEL EXT CAROTID BILAT MOD SED  12/15/2019   IR ANGIO VERTEBRAL SEL VERTEBRAL UNI R MOD SED  12/15/2019   IR  ANGIOGRAM EXTREMITY LEFT  12/15/2019   IR ANGIOGRAM EXTREMITY LEFT  12/15/2019   IR CT HEAD LTD  12/15/2019   IR PERCUTANEOUS ART THROMBECTOMY/INFUSION INTRACRANIAL INC DIAG ANGIO  12/15/2019   POLYPECTOMY  09/21/2020   Procedure: POLYPECTOMY;  Surgeon: San Sandor GAILS, DO;  Location: [facility] ENDOSCOPY;  Service:  Gastroenterology;;   POLYPECTOMY  02/18/2024   Procedure: POLYPECTOMY, INTESTINE;  Surgeon: San Sandor GAILS, DO;  Location: [facility] ENDOSCOPY;  Service: Gastroenterology;;   RADIOLOGY WITH ANESTHESIA N/A 12/15/2019   Procedure: IR WITH ANESTHESIA;  Surgeon: Dolphus Carrion, MD;  Location: [facility] OR;  Service: Radiology;  Laterality: N/A;     reports that he has been smoking cigarettes. He has a 34 pack-year smoking history. He has been exposed to tobacco smoke. He has never used smokeless tobacco. He reports that he does not drink alcohol and does not use drugs.  No Known Allergies  Family History  Problem Relation Age of Onset   Depression Mother 29       Suicide   Heart disease Father 26       Deceased from massive heart-attack   Hyperlipidemia Father    Hypertension Father    COPD Maternal Grandfather    Cancer Paternal Grandmother        Male Cancer   Heart disease Paternal Grandmother    Colon cancer Neg Hx    Pancreatic cancer Neg Hx    Esophageal cancer Neg Hx    Prior to Admission medications   Medication Sig Start Date End Date Taking? Authorizing Provider  aspirin  EC 81 MG tablet Take 1 tablet (81 mg total) by mouth daily. Swallow whole. 05/31/21  Yes Court Dorn PARAS, MD  atorvastatin  (LIPITOR ) 80 MG tablet Take 1 tablet by mouth once daily 10/03/23  Yes Court Dorn PARAS, MD  citalopram  (CELEXA ) 20 MG tablet Take 1 tablet (20 mg total) by mouth daily. 01/23/24  Yes Joyce Norleen BROCKS, MD  Evolocumab  (REPATHA  SURECLICK) 140 MG/ML SOAJ INJECT 1 PEN  SUBCUTANEOUSLY EVERY 14 DAYS 02/15/24  Yes Court Dorn PARAS, MD  ezetimibe  (ZETIA ) 10 MG tablet Take 1 tablet by mouth once daily 04/17/24  Yes Lalonde, John C, MD  metoprolol  succinate (TOPROL -XL) 50 MG 24 hr tablet Take 50 mg by mouth daily. 01/18/24  Yes [provider]    Physical Exam: Vitals:   05/12/24 2030 05/12/24 2100 05/12/24 2104 05/12/24 2123  BP: (!) 180/92 (!) 177/95  (!) 174/78  Pulse: 60 64  65  Resp: (!)  23 13  18   Temp:   97.6 F (36.4 C) 97.6 F (36.4 C)  TempSrc:   Oral Oral  SpO2: 95% 97%  95%  Weight:      Height:        Constitutional: NAD, calm, comfortable Vitals:   05/12/24 2030 05/12/24 2100 05/12/24 2104 05/12/24 2123  BP: (!) 180/92 (!) 177/95  (!) 174/78  Pulse: 60 64  65  Resp: (!) 23 13  18   Temp:   97.6 F (36.4 C) 97.6 F (36.4 C)  TempSrc:   Oral Oral  SpO2: 95% 97%  95%  Weight:      Height:       Eyes: PERRL, lids and conjunctivae normal ENMT: Mucous membranes are moist.  Neck: normal, supple, no masses, no thyromegaly Respiratory: clear to auscultation bilaterally, no wheezing, no crackles. Normal respiratory effort. No accessory muscle use.  Cardiovascular: Regular rate and rhythm, no murmurs / rubs / gallops. No extremity edema.  Abdomen: no tenderness, no masses palpated. No hepatosplenomegaly. Bowel sounds positive.  Musculoskeletal: no clubbing / cyanosis. No joint deformity upper and lower extremities.   Skin: no rashes, lesions, ulcers. No induration Neurologic: Difficulty and delay with answering questions, with expressive aphasia, words moderately slurred.  Facial hair- appears to have slight asymmetry at rest, with slight facial droop on the right, but with active exam this is not appreciated, 5 out of 5 strength bilateral upper extremities and lower extremities, sensation intact globally. Psychiatric: Normal judgment and insight. Alert and oriented x 3. Normal mood.   Labs on Admission: I have personally reviewed following labs and imaging studies  CBC: Recent Labs  Lab 05/12/24 1800  WBC 7.5  NEUTROABS 3.6  HGB 15.7  HCT 45.3  MCV 92.6  PLT 153   Basic Metabolic Panel: Recent Labs  Lab 05/12/24 1800  NA 137  K 3.4*  CL 103  CO2 25  GLUCOSE 108*  BUN 15  CREATININE 0.96  CALCIUM  9.2   GFR: Estimated Creatinine Clearance: 119.2 mL/min (by C-G formula based on SCr of 0.96 mg/dL). Liver Function Tests: Recent Labs  Lab  05/12/24 1800  AST 20  ALT 21  ALKPHOS 63  BILITOT 0.8  PROT 6.4*  ALBUMIN  3.5   Coagulation Profile: Recent Labs  Lab 05/12/24 1800  INR 1.0   CBG: Recent Labs  Lab 05/12/24 1811  GLUCAP 120*   Urine analysis:    Component Value Date/Time   COLORURINE YELLOW 05/12/2024 1836   APPEARANCEUR CLEAR 05/12/2024 1836   LABSPEC 1.033 (H) 05/12/2024 1836   LABSPEC 1.025 01/23/2019 1202   PHURINE 6.0 05/12/2024 1836   GLUCOSEU NEGATIVE 05/12/2024 1836   HGBUR NEGATIVE 05/12/2024 1836   BILIRUBINUR NEGATIVE 05/12/2024 1836   BILIRUBINUR negative 01/23/2019 1202   KETONESUR NEGATIVE 05/12/2024 1836   PROTEINUR NEGATIVE 05/12/2024 1836   UROBILINOGEN 0.2 05/28/2012 0304   NITRITE NEGATIVE 05/12/2024 1836   LEUKOCYTESUR NEGATIVE 05/12/2024 1836    Radiological Exams on Admission: CT ANGIO HEAD NECK W WO CM W PERF (CODE STROKE) Addendum Date: 05/12/2024 ADDENDUM REPORT: 05/12/2024 18:56 ADDENDUM: 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 Daralene, who verbally acknowledged these results. Electronically Signed   By: Donnice Mania M.D.   On: 05/12/2024 18:56   Result Date: 05/12/2024 CLINICAL DATA:  Code stroke, neuro deficit, memory issues, slurred speech, right-sided facial droop. EXAM: CT ANGIOGRAPHY HEAD AND NECK CT PERFUSION BRAIN TECHNIQUE: Multidetector CT imaging of the head and neck was performed using the standard protocol during bolus administration of intravenous contrast. Multiplanar CT image reconstructions and MIPs were obtained to evaluate the vascular anatomy. Carotid stenosis measurements (when applicable) are obtained utilizing NASCET criteria, using the distal internal carotid diameter as the denominator. Multiphase CT imaging of the brain was performed following IV bolus contrast injection. Subsequent parametric perfusion maps were  calculated using RAPID software. RADIATION DOSE REDUCTION: This exam was performed according to the departmental dose-optimization program which includes automated exposure control, adjustment of the mA and/or kV according to patient size and/or use of iterative reconstruction technique. CONTRAST:  OMNIPAQUE  IOHEXOL  350 MG/ML SOLN COMPARISON:  Same-day head CT.  CTA head 12/14/2019. FINDINGS: CTA NECK FINDINGS Aortic arch: Standard configuration of the aortic arch. Imaged portion shows no evidence of aneurysm or dissection. Mild atherosclerosis of the partially visualized aortic arch. Pulmonary  arteries: Not well visualized. Subclavian arteries: The right subclavian artery is patent proximally. There is occlusion of the proximal left subclavian artery proximal to the origin of the left vertebral artery. Mild atherosclerosis noted. Right carotid system: Patent from the origin to the skull base. Calcified and noncalcified atherosclerotic plaque at the carotid bifurcation resulting in approximately 50% stenosis. Left carotid system: Patent from the origin to the distal common carotid artery. At the carotid bifurcation there is prominent calcified and noncalcified atherosclerotic plaque with occlusion of the internal carotid artery at its origin. The external carotid artery branches are patent. Vertebral arteries: Codominant. No evidence of dissection, stenosis (50% or greater), or occlusion. Skeleton: No acute or aggressive finding noted. Other neck: The visualized airway is patent. No cervical lymphadenopathy. Upper chest: Visualized lung apices are clear. Review of the MIP images confirms the above findings CTA HEAD FINDINGS ANTERIOR CIRCULATION: The internal carotid artery is occluded from the cervical segment to the paraclinoid segment. Reconstitution at the clinoid segment with the vessel remains patent to the ICA terminus. The right ICA is patent from the cervical segment to the ICA terminus.  Atherosclerosis throughout the right ICA with moderate stenosis of the paraclinoid segment. MCAs: The left M1 segment is patent proximally. There is abrupt occlusion of a proximal M2 branch of the left MCA. Dominant left M2 inferior division branch appears patent. There are additional M2 superior vision branches of the left MCA which appear occluded more distally. The right MCA is patent. ACAs: The anterior cerebral arteries are patent bilaterally. POSTERIOR CIRCULATION: No significant stenosis, proximal occlusion, aneurysm, or vascular malformation. PCAs: Patent bilaterally. Severe stenosis of the P1 segment of the left PCA. Pcomm: Not well visualized. SCAs: The superior cerebellar arteries are patent bilaterally. Basilar artery: Patent AICAs: Not well visualized. PICAs: Patent Vertebral arteries: The intracranial vertebral arteries are patent. Venous sinuses: As permitted by contrast timing, patent. Anatomic variants: None Review of the MIP images confirms the above findings CT Brain Perfusion Findings: ASPECTS: 10 CBF (<30%) Volume: 6mL Perfusion (Tmax>6.0s) volume: Mismatch Volume: Infarction Location:Left frontal operculum within the anterior aspect of the left MCA territory. There are additional surrounding areas of elevated T-max throughout the left MCA territory most pronounced within the anterior aspect of the left frontal operculum. IMPRESSION: Occlusion of a proximal M2 branch of the left MCA. Multiple additional M2 superior division branches are occluded more distally. Core infarct within the left frontal operculum within the anterior aspect of the left MCA territory. Significant region of surrounding elevated T-max concerning for at-risk tissue. 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 Intracranial atherosclerosis as above. Moderate stenosis of the right paraclinoid ICA. Severe stenosis and possible short segment occlusion of the P1 segment  left PCA. Atherosclerosis at the right carotid bifurcation resulting in approximately 50% stenosis. 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 Aortic Atherosclerosis (ICD10-I70.0). These results were communicated to D. Wolfe At 6:15 pm on 05/12/2024 by text page via the [facility] messaging system. Electronically Signed: By: Donnice Mania M.D. On: 05/12/2024 18:32   CT HEAD CODE STROKE WO CONTRAST Result Date: 05/12/2024 CLINICAL DATA:  Code stroke. Neuro deficit, concern for stroke, memory issues, slurred speech, right-sided facial droop. EXAM: CT HEAD WITHOUT CONTRAST TECHNIQUE: Contiguous axial images were obtained from the base of the skull through the vertex without intravenous contrast. RADIATION  DOSE REDUCTION: This exam was performed according to the departmental dose-optimization program which includes automated exposure control, adjustment of the mA and/or kV according to patient size and/or use of iterative reconstruction technique. COMPARISON:  CT head 12/15/2019. FINDINGS: Brain: No acute intracranial hemorrhage. Encephalomalacia in the left parietal lobe suggestive of remote infarct. No significant edema, mass effect, or midline shift. The basilar cisterns are patent. Posterior fossa is unremarkable. No extra-axial fluid collections. Ventricles: The ventricles are normal. Vascular: Abnormal hyperattenuation along the distal M1/proximal M2 segments of the left MCA. No unexpected calcification. Skull: No acute or aggressive finding. Orbits: Orbits are symmetric. Sinuses: The visualized paranasal sinuses are clear. Other: Mastoid air cells are clear. ASPECTS [facility] Stroke Program Early CT Score) - Ganglionic level infarction (caudate, lentiform nuclei, internal capsule, insula, M1-M3 cortex): 7 - Supraganglionic infarction (M4-M6 cortex): 3 Total score (0-10 with  10 being normal): 10 IMPRESSION: 1. No acute intracranial hemorrhage. 2. Abnormal hyperattenuation along the distal M1 and proximal M2 segment of the left MCA concerning for thrombus. 3. ASPECTS is 10 These results were communicated to Dr. Jerri at 6:12 pm on 05/12/2024 by text page via the [facility] messaging system. Electronically Signed   By: Donnice Mania M.D.   On: 05/12/2024 18:13   EKG: Independently reviewed.  Sinus rhythm, rate 71, QTc 455.  Artifacts present.  Will repeat.  Assessment/Plan Principal Problem:   Expressive aphasia Active Problems:   Hypertension   Tobacco abuse   Depression, major, in remission (HCC)  Assessment and Plan: * Expressive aphasia Onset about 4:30 PM today, concern that symptoms started last night with memory impairment-daughter denies this.  Slight right facial droop at rest,not present with active exam.  Otherwise no other focal neurologic deficits.  Ongoing tobacco abuse.  Reports compliance with aspirin  daily. - CT - Abnormal hyperattenuation along the distal M1 and proximal M2 segment of the left MCA concerning for thrombus. - CTA head and neck- Core infarct within the left frontal operculum within the anterior aspect of the left MCA territory. Significant region of surrounding elevated T-max concerning for at-risk tissue.  See detailed report on occlusions and stenosis. - Evaluated by teleneurologist Dr. Jerri- Patient not TNK candidate given just outside window.   - Not IR candidate given chronic left ICA occlusion with unsuccessful attempt 4 years ago, no access to left MCA and with relatively mild stroke symptoms.  - Recommend MRI brain, regular stroke workup.   - IV bolus followed by IV fluid, permissive hypertension, no treatment if BP less than 220/120.   - Aspirin   - 500 cc bolus, continue N/s 75cc/hr per neurology - Passed swallow evaluation -Echocardiogram -Lipid panel, hemoglobin A1c -PT, OT, speech therapy evaluation  Depression, major, in  remission (HCC) Resume Celexa   Tobacco abuse Counseled extensively to quit smoking cigarettes - Nicotine  patch  Hypertension Allow for permissive hypertension in the setting of acute stroke -Hold metoprolol  XL 50 mg daily - IV hydralazine  10 mg for systolic greater than 220/120   DVT prophylaxis: SCDs- pending MRI Code Status: FULL code Family Communication: Daughter at bedside Disposition Plan: ~ 2 days Consults called: Neurology Admission status: Inpt tele I certify that at the point of admission it is my clinical judgment that the patient will require inpatient hospital care spanning beyond 2 midnights from the point of admission due to high intensity of service, high risk for further deterioration and high frequency of surveillance required.   Author: Tully FORBES Carwin, MD 05/12/2024 10:58 PM  For  on call review [email or web address].

## 2024-05-12 NOTE — Assessment & Plan Note (Addendum)
 Allow for permissive hypertension in the setting of acute stroke -Hold metoprolol  XL 50 mg daily - IV hydralazine  10 mg for systolic greater than 220/120

## 2024-05-12 NOTE — Assessment & Plan Note (Addendum)
 Onset about 4:30 PM today, concern that symptoms started last night with memory impairment-daughter denies this.  Slight right facial droop at rest,not present with active exam.  Otherwise no other focal neurologic deficits.  Ongoing tobacco abuse.  Reports compliance with aspirin  daily. - CT - Abnormal hyperattenuation along the distal M1 and proximal M2 segment of the left MCA concerning for thrombus. - CTA head and neck- Core infarct within the left frontal operculum within the anterior aspect of the left MCA territory. Significant region of surrounding elevated T-max concerning for at-risk tissue.  See detailed report on occlusions and stenosis. - Evaluated by teleneurologist Dr. Jerri- Patient not TNK candidate given just outside window.   - Not IR candidate given chronic left ICA occlusion with unsuccessful attempt 4 years ago, no access to left MCA and with relatively mild stroke symptoms.  - Recommend MRI brain, regular stroke workup.   - IV bolus followed by IV fluid, permissive hypertension, no treatment if BP less than 220/120.   - Aspirin   - 500 cc bolus, continue N/s 75cc/hr per neurology - Passed swallow evaluation -Echocardiogram -Lipid panel, hemoglobin A1c -PT, OT, speech therapy evaluation

## 2024-05-12 NOTE — Assessment & Plan Note (Addendum)
 Counseled extensively to quit smoking cigarettes - Nicotine  patch

## 2024-05-12 NOTE — Consult Note (Signed)
 [facility] Telemedicine Consult   Requesting Provider: Dr Francesca  Chief Complaint: aphasia  HPI: 53 year old male with history of heavy smoking, CAD status post stenting, hypertension, hyperlipidemia, stroke presented to ED for code stroke.  Patient has aphasia, difficult to get information.  I spent extensive time with EMS, patient roommate and his stepdaughter to gather history.  Per patient roommate, patient mowed yard yesterday but seems not doing well, at night he was driving but complained to his stepdaughter that he has vision changes not able to find his way back to home.  This morning 10:30 AM, stepdaughter talked with patient over the phone and he seemed okay.  Around 1:30 PM his roommate left the house for doctor appointment, he said okay.  When the roommate came back 5 PM, he found to have difficulty speaking, hard to get words out.  Apparently, he was at home alone smoking cigarette at back porch exposing to sun and heat, did not talk to anybody, not sure what time symptoms started, and he had difficulty expressing himself.  EMS was called, on arrival, patient still has expressive aphasia, but following simple commands, moving all extremities.  BP 136/78, glucose 99.  CT head left MCA hyperdense sign.  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  CTP 6/185cc, with known left ICA occlusion and delayed TTP.   Patient had stroke in 11/2019 with subacute left parietal infarct.  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 CTP 6/208cc, largely due to slow collateral flow.  Cerebral angiogram showed left ICA occlusion, left ICA occlusion with subclavian steal, and unsuccessful attempt of recanalization of left ICA occlusion.  Not able to tolerate MRI at that time.  EF 55 to 60%, LDL 93, A1c 6.1.  Received IV fluid and bolus.  Had orthostatic hypotension, put on TED house and  avoid low BP.  Put on DAPT for 3 months and then Plavix  alone.  Continue Lipitor  80.  Recommend smoking cessation and follow-up with vascular surgery to consider bypass surgery.  Patient follow-up with vascular surgery but denies any subclavian steal symptoms, continued on  medical management.  Current medication including aspirin  81, Lipitor  80, Repatha  and Zetia   LKW: 1:30 PM tpa given?: No, outside window IR Thrombectomy? No, chronic left ICA occlusion with previous unsuccessful attempt, no access to left M2 occlusion Modified Rankin Scale: 2-Slight disability-UNABLE to perform all activities but does not need assistance   Exam: Vitals:   05/12/24 1812 05/12/24 1830  BP: (!) 185/82 (!) 191/85  Pulse: 73 73  Resp: (!) 23 (!) 22  Temp: 98.1 F (36.7 C)   SpO2: 92% 96%     Temp:  [98.1 F (36.7 C)] 98.1 F (36.7 C) (07/21 1812) Pulse Rate:  [73] 73 (07/21 1830) Resp:  [22-23] 22 (07/21 1830) BP: (185-191)/(82-85) 191/85 (07/21 1830) SpO2:  [92 %-96 %] 96 % (07/21 1830) Weight:  [136.1 kg] 136.1 kg (07/21 1740)  General - Well nourished, well developed, in no apparent distress.  Ophthalmologic - fundi not visualized due to noncooperation.  Cardiovascular - Regular rhythm and rate.  Neuro - awake, alert, eyes open, expressive aphasia, difficulty answer orientation questions, but following all simple commands.  Not able to name, able to repeat 3 word sentence but not 5 word sentences. No gaze palsy, tracking bilaterally, visual field full, PERRL. No facial droop. Tongue midline. Bilateral UEs 5/5, no drift. Bilaterally  LEs 5/5, no drift. Sensation symmetrical bilaterally, b/l FTN intact, gait not tested.     NIH Stroke Scale  Level Of Consciousness 0=Alert; keenly responsive 1=Arouse to minor stimulation 2=Requires repeated stimulation to arouse or movements to pain 3=postures or unresponsive 0  LOC Questions to Month and Age 81=Answers both questions correctly 1=Answers  one question correctly or dysarthria/intubated/trauma/language barrier 2=Answers neither question correctly or aphasia 2  LOC Commands      -Open/Close eyes     -Open/close grip     -Pantomime commands if communication barrier 0=Performs both tasks correctly 1=Performs one task correctly 2=Performs neighter task correctly 0  Best Gaze     -Only assess horizontal gaze 0=Normal 1=Partial gaze palsy 2=Forced deviation, or total gaze paresis 0  Visual 0=No visual loss 1=Partial hemianopia 2=Complete hemianopia 3=Bilateral hemianopia (blind including cortical blindness) 0  Facial Palsy     -Use grimace if obtunded 0=Normal symmetrical movement 1=Minor paralysis (asymmetry) 2=Partial paralysis (lower face) 3=Complete paralysis (upper and lower face) 0  Motor  0=No drift for 10/5 seconds 1=Drift, but does not hit bed 2=Some antigravity effort, hits  bed 3=No effort against gravity, limb falls 4=No movement 0=Amputation/joint fusion Right Arm 0     Leg 0    Left Arm 0     Leg 0  Limb Ataxia     - FNT/HTS 0=Absent or does not understand or paralyzed or amputation/joint fusion 1=Present in one limb 2=Present in two limbs 0  Sensory 0=Normal 1=Mild to moderate sensory loss 2=Severe to total sensory loss or coma/unresponsive 0  Best Language 0=No aphasia, normal 1=Mild to moderate aphasia 2=Severe aphasia 3=Mute, global aphasia, or coma/unresponsive 1  Dysarthria 0=Normal 1=Mild to moderate 2=Severe, unintelligible or mute/anarthric 0=intubated/unable to test 2  Extinction/Neglect 0=No abnormality 1=visual/tactile/auditory/spatia/personal inattention/Extinction to bilateral simultaneous stimulation 2=Profound neglect/extinction more than 1 modality  0  Total   5      Imaging Reviewed:  CT ANGIO HEAD NECK W WO CM W PERF (CODE STROKE) Addendum Date: 05/12/2024 ADDENDUM REPORT: 05/12/2024 18:56 ADDENDUM: Findings of left ICA and left MCA occlusion as well as CT perfusion  findings were also called by telephone at the time of interpretation on 05/12/2024 at 6:38 pm to provider Dr. Daralene, who verbally acknowledged these results. Electronically Signed   By: Donnice Mania M.D.   On: 05/12/2024 18:56   Result Date: 05/12/2024 CLINICAL DATA:  Code stroke, neuro deficit, memory issues, slurred speech, right-sided facial droop. EXAM: CT ANGIOGRAPHY HEAD AND NECK CT PERFUSION BRAIN TECHNIQUE: Multidetector CT imaging of the head and neck was performed using the standard protocol during bolus administration of intravenous contrast. Multiplanar CT image reconstructions and MIPs were obtained to evaluate the vascular anatomy. Carotid stenosis measurements (when applicable) are obtained utilizing NASCET criteria, using the distal internal carotid diameter as the denominator. Multiphase CT imaging of the brain was performed following IV bolus contrast injection. Subsequent parametric perfusion maps were calculated using RAPID software. RADIATION DOSE REDUCTION: This exam was performed according to the departmental dose-optimization program which includes automated exposure control, adjustment of the mA and/or kV according to patient size and/or use of iterative reconstruction technique. CONTRAST:  OMNIPAQUE  IOHEXOL  350 MG/ML SOLN COMPARISON:  Same-day head CT.  CTA head 12/14/2019. FINDINGS: CTA NECK FINDINGS Aortic arch: Standard configuration of the aortic arch. Imaged portion shows no evidence of aneurysm or dissection. Mild atherosclerosis of the partially visualized aortic arch. Pulmonary arteries: Not well visualized. Subclavian arteries: The right subclavian artery is patent  proximally. There is occlusion of the proximal left subclavian artery proximal to the origin of the left vertebral artery. Mild atherosclerosis noted. Right carotid system: Patent from the origin to the skull base. Calcified and noncalcified atherosclerotic plaque at the carotid bifurcation resulting in  approximately 50% stenosis. Left carotid system: Patent from the origin to the distal common carotid artery. At the carotid bifurcation there is prominent calcified and noncalcified atherosclerotic plaque with occlusion of the internal carotid artery at its origin. The external carotid artery branches are patent. Vertebral arteries: Codominant. No evidence of dissection, stenosis (50% or greater), or occlusion. Skeleton: No acute or aggressive finding noted. Other neck: The visualized airway is patent. No cervical lymphadenopathy. Upper chest: Visualized lung apices are clear. Review of the MIP images confirms the above findings CTA HEAD FINDINGS ANTERIOR CIRCULATION: The internal carotid artery is occluded from the cervical segment to the paraclinoid segment. Reconstitution at the clinoid segment with the vessel remains patent to the ICA terminus. The right ICA is patent from the cervical segment to the ICA terminus. Atherosclerosis throughout the right ICA with moderate stenosis of the paraclinoid segment. MCAs: The left M1 segment is patent proximally. There is abrupt occlusion of a proximal M2 branch of the left MCA. Dominant left M2 inferior division branch appears patent. There are additional M2 superior vision branches of the left MCA which appear occluded more distally. The right MCA is patent. ACAs: The anterior cerebral arteries are patent bilaterally. POSTERIOR CIRCULATION: No significant stenosis, proximal occlusion, aneurysm, or vascular malformation. PCAs: Patent bilaterally. Severe stenosis of the P1 segment of the left PCA. Pcomm: Not well visualized. SCAs: The superior cerebellar arteries are patent bilaterally. Basilar artery: Patent AICAs: Not well visualized. PICAs: Patent Vertebral arteries: The intracranial vertebral arteries are patent. Venous sinuses: As permitted by contrast timing, patent. Anatomic variants: None Review of the MIP images confirms the above findings CT Brain Perfusion  Findings: ASPECTS: 10 CBF (<30%) Volume: 6mL Perfusion (Tmax>6.0s) volume: Mismatch Volume: Infarction Location:Left frontal operculum within the anterior aspect of the left MCA territory. There are additional surrounding areas of elevated T-max throughout the left MCA territory most pronounced within the anterior aspect of the left frontal operculum. IMPRESSION: Occlusion of a proximal M2 branch of the left MCA. Multiple additional M2 superior division branches are occluded more distally. Core infarct within the left frontal operculum within the anterior aspect of the left MCA territory. Significant region of surrounding elevated T-max concerning for at-risk tissue. 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 Intracranial atherosclerosis as above. Moderate stenosis of the right paraclinoid ICA. Severe stenosis and possible short segment occlusion of the P1 segment left PCA. Atherosclerosis at the right carotid bifurcation resulting in approximately 50% stenosis. 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 Aortic Atherosclerosis (ICD10-I70.0). These results were communicated to D. Wolfe At 6:15 pm on 05/12/2024 by text page via the [facility] messaging system. Electronically Signed: By: Donnice Mania M.D. On: 05/12/2024 18:32   CT HEAD CODE STROKE WO CONTRAST Result Date: 05/12/2024 CLINICAL DATA:  Code stroke. Neuro deficit, concern for stroke, memory issues, slurred speech, right-sided facial droop. EXAM: CT HEAD WITHOUT CONTRAST TECHNIQUE: Contiguous axial images were obtained from the base of the skull through the vertex without intravenous contrast. RADIATION DOSE REDUCTION: This exam was performed according to the departmental dose-optimization program  which includes automated exposure control, adjustment of the mA  and/or kV according to patient size and/or use of iterative reconstruction technique. COMPARISON:  CT head 12/15/2019. FINDINGS: Brain: No acute intracranial hemorrhage. Encephalomalacia in the left parietal lobe suggestive of remote infarct. No significant edema, mass effect, or midline shift. The basilar cisterns are patent. Posterior fossa is unremarkable. No extra-axial fluid collections. Ventricles: The ventricles are normal. Vascular: Abnormal hyperattenuation along the distal M1/proximal M2 segments of the left MCA. No unexpected calcification. Skull: No acute or aggressive finding. Orbits: Orbits are symmetric. Sinuses: The visualized paranasal sinuses are clear. Other: Mastoid air cells are clear. ASPECTS [facility] Stroke Program Early CT Score) - Ganglionic level infarction (caudate, lentiform nuclei, internal capsule, insula, M1-M3 cortex): 7 - Supraganglionic infarction (M4-M6 cortex): 3 Total score (0-10 with 10 being normal): 10 IMPRESSION: 1. No acute intracranial hemorrhage. 2. Abnormal hyperattenuation along the distal M1 and proximal M2 segment of the left MCA concerning for thrombus. 3. ASPECTS is 10 These results were communicated to Dr. Jerri at 6:12 pm on 05/12/2024 by text page via the [facility] messaging system. Electronically Signed   By: Donnice Mania M.D.   On: 05/12/2024 18:13     Labs reviewed in epic and pertinent values follow: Creatinine 0.96, platelet 153  Assessment:  53 year old male with history of heavy smoking, CAD status post stenting, hypertension, hyperlipidemia, stroke presented to ED for acute onset expressive aphasia.  Last seen well difficult to obtain but eventually was able to determined at 1330. CT head left MCA hyperdense sign.  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  CTP 6/185cc, large penumbra due to slow collateral flow with known left ICA occlusion.  Patient not TNK candidate given just outside window.  Not IR candidate given  chronic left ICA occlusion with unsuccessful attempt 4 years ago, no access to left MCA and with relatively mild stroke symptoms.  Recommend MRI brain to further evaluate stroke, continue regular stroke workup.  IV bolus followed by IV fluid, permissive hypertension, no treatment if BP less than 220/120.  Aspirin  PR if not passing swallow.   Recommendations:  Continue further stroke work up  Frequent neuro checks Telemetry monitoring MRI brain  Echocardiogram  UDS, fasting lipid panel and HgbA1C PT/OT/speech consult Permissive hypertension (only treat if BP > 220/120) for 24-48 hours post stroke onset IV bolus 500cc followed by IV fluid at 75cc GI and DVT prophylaxis  ASA PR 300 mg given n.p.o. status Consider DAPT tomorrow if po access Stroke risk factor modification Quit smoking Head of bed flat Discussed with Dr. Mai ED physician We we will follow   Consult Participants: Patient, RN, patient stepdaughter, me Location of the provider: [facility] Location of the patient: [facility]  Time Code Stroke Page received:  1740 Time neurologist arrived:  1742 Time NIHSS completed: 1758    This consult was provided via telemedicine with 2-way video and audio communication. The patient/family was informed that care would be provided in this way and agreed to receive care in this manner.   This patient is receiving care for possible acute neurological changes. There was 80 minutes of care by this provider at the time of service, including time for direct evaluation via telemedicine, review of medical records, calling pt roommate, discussing with EMS, talking with pt step daughter, imaging studies and discussion of findings with providers, the patient and/or family. I also discussed with Dr. Dolphus IR and Dr. Mai EDP.  Ary Jerri,  MD PhD Stroke Neurology 05/12/2024 7:00 PM

## 2024-05-12 NOTE — Assessment & Plan Note (Signed)
Resume Celexa 

## 2024-05-12 NOTE — ED Notes (Signed)
 ED Provider at bedside.

## 2024-05-12 NOTE — ED Triage Notes (Signed)
 Pt arrived via [facility] from home c/o LKW 1630. Pt presents with slurred speech and memory impairment. Pt had previous stroke in 2021, and takes daily ASA. Per EMS, Pts CBG on scene was 99.

## 2024-05-12 NOTE — ED Notes (Signed)
 ED TO INPATIENT HANDOFF REPORT  ED Nurse Name and Phone #: Jonette, RN  S Name/Age/Gender Brandon [name] 53 y.o. male Room/Bed: APA19/APA19  Code Status   Code Status: Prior  Home/SNF/Other Home Patient oriented to: self, place, time, and situation Is this baseline? Yes   Triage Complete: Triage complete  Chief Complaint Expressive aphasia [R47.01]  Triage Note Pt arrived via [facility] from home c/o LKW 1630. Pt presents with slurred speech and memory impairment. Pt had previous stroke in 2021, and takes daily ASA. Per EMS, Pts CBG on scene was 99.    Allergies No Known Allergies  Level of Care/Admitting Diagnosis ED Disposition     ED Disposition  Admit   Condition  --   Comment  Hospital Area: [facility] [[id number]]  Level of Care: Telemetry [5]  Covid Evaluation: Asymptomatic - no recent exposure (last 10 days) testing not required  Diagnosis: Expressive aphasia [637005]  Admitting Physician: EMOKPAE, EJIROGHENE E [[id number]]  Attending Physician: EMOKPAE, EJIROGHENE E [phone]  Certification:: I certify this patient will need inpatient services for at least 2 midnights  Expected Medical Readiness: 05/14/2024          B Medical/Surgery History 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, due to reduced neck mobility   Heart attack (HCC)    History of noncompliance with medical treatment    Due to financial considerations   Hyperlipidemia    Hypertension    Stroke [facility])    Tobacco abuse     40 pack years   Past Surgical History:  Procedure Laterality Date   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   Procedure: BYPASS GRAFT CAROTID-SUBCLAVIAN USING HEMASHIELD GOLD GRAFT;  Surgeon: Harvey Carlin FORBES, MD;  Location: [facility] OR;  Service: Vascular;  Laterality: Left;   COLONOSCOPY N/A 02/18/2024   Procedure: COLONOSCOPY;  Surgeon: San Sandor GAILS, DO;  Location: [facility];  Service: Gastroenterology;  Laterality: N/A;   COLONOSCOPY WITH PROPOFOL  N/A 09/21/2020   Procedure: COLONOSCOPY WITH PROPOFOL ;  Surgeon: San Sandor GAILS, DO;  Location: [facility];  Service: Gastroenterology;  Laterality: N/A;   CORONARY ANGIOPLASTY WITH STENT PLACEMENT     HEMOSTASIS CLIP PLACEMENT  09/21/2020   Procedure: HEMOSTASIS CLIP PLACEMENT;  Surgeon: San Sandor GAILS, DO;  Location: [facility];  Service: Gastroenterology;;   IR ANGIO EXTERNAL CAROTID SEL EXT CAROTID BILAT MOD SED  12/15/2019   IR ANGIO VERTEBRAL SEL VERTEBRAL UNI R MOD SED  12/15/2019   IR ANGIOGRAM EXTREMITY LEFT  12/15/2019   IR ANGIOGRAM EXTREMITY LEFT  12/15/2019   IR CT HEAD LTD  12/15/2019   IR PERCUTANEOUS ART THROMBECTOMY/INFUSION INTRACRANIAL INC DIAG ANGIO  12/15/2019   POLYPECTOMY  09/21/2020   Procedure: POLYPECTOMY;  Surgeon: San Sandor GAILS, DO;  Location: [facility];  Service: Gastroenterology;;   POLYPECTOMY  02/18/2024   Procedure: POLYPECTOMY, INTESTINE;  Surgeon: San Sandor GAILS, DO;  Location: [facility];  Service: Gastroenterology;;   RADIOLOGY WITH ANESTHESIA N/A 12/15/2019   Procedure: IR WITH ANESTHESIA;  Surgeon: Dolphus Carrion, MD;  Location: [facility] OR;  Service:  Radiology;  Laterality: N/A;     A IV Location/Drains/Wounds Patient Lines/Drains/Airways Status     Active Line/Drains/Airways     Name Placement date Placement time Site Days   Peripheral IV 05/12/24 18 G Anterior;Distal;Left;Upper Arm 05/12/24  1737  Arm  less than 1            Intake/Output Last 24  hours  Intake/Output Summary (Last 24 hours) at 05/12/2024 2053 Last data filed at 05/12/2024 1830 Gross per 24 hour  Intake 500 ml  Output --  Net 500 ml    Labs/Imaging Results for orders placed or performed during the hospital encounter of 05/12/24 (from the past 48 hours)  Ethanol     Status: None   Collection Time: 05/12/24  6:00 PM  Result Value Ref Range   Alcohol, Ethyl (B) <15 <15 mg/dL    Comment: (NOTE) For medical purposes only. Performed at [facility], [address] [place], [place] [address]   Protime-INR     Status: None   Collection Time: 05/12/24  6:00 PM  Result Value Ref Range   Prothrombin Time 14.1 11.4 - 15.2 seconds   INR 1.0 0.8 - 1.2    Comment: (NOTE) INR goal varies based on device and disease states. Performed at [facility], [address], [place], [place] [address]   APTT     Status: None   Collection Time: 05/12/24  6:00 PM  Result Value Ref Range   aPTT 27 24 - 36 seconds    Comment: Performed at [facility], [address] [place], [place] [address]  CBC     Status: None   Collection Time: 05/12/24  6:00 PM  Result Value Ref Range   WBC 7.5 4.0 - 10.5 K/uL   RBC 4.89 4.22 - 5.81 MIL/uL   Hemoglobin 15.7 13.0 - 17.0 g/dL   HCT 54.6 60.9 - 47.9 %   MCV 92.6 80.0 - 100.0 fL   MCH 32.1 26.0 - 34.0 pg   MCHC 34.7 30.0 - 36.0 g/dL   RDW 86.1 88.4 - 84.4 %   Platelets 153 150 - 400 K/uL   nRBC 0.0 0.0 - 0.2 %    Comment: Performed at [facility], [address] [place], [place] [address]  Differential     Status: None   Collection Time: 05/12/24  6:00 PM  Result Value Ref Range   Neutrophils Relative % 48 %   Neutro Abs 3.6 1.7 - 7.7 K/uL   Lymphocytes Relative 39 %   Lymphs Abs 2.9 0.7 - 4.0 K/uL   Monocytes Relative 7 %   Monocytes Absolute 0.5 0.1 - 1.0 K/uL   Eosinophils Relative 4 %   Eosinophils Absolute 0.3 0.0 - 0.5 K/uL   Basophils Relative 2 %   Basophils Absolute 0.1 0.0 - 0.1 K/uL   Immature Granulocytes 0 %   Abs  Immature Granulocytes 0.01 0.00 - 0.07 K/uL    Comment: Performed at [facility], [address], [place], [place] [address]  Comprehensive metabolic panel     Status: Abnormal   Collection Time: 05/12/24  6:00 PM  Result Value Ref Range   Sodium 137 135 - 145 mmol/L   Potassium 3.4 (L) 3.5 - 5.1 mmol/L   Chloride 103 98 - 111 mmol/L   CO2 25 22 - 32 mmol/L   Glucose, Bld 108 (H) 70 - 99 mg/dL    Comment: Glucose reference range applies only to samples taken after fasting for at  least 8 hours.   BUN 15 6 - 20 mg/dL   Creatinine, Ser 9.03 0.61 - 1.24 mg/dL   Calcium  9.2 8.9 - 10.3 mg/dL   Total Protein 6.4 (L) 6.5 - 8.1 g/dL   Albumin  3.5 3.5 - 5.0 g/dL   AST 20 15 - 41 U/L   ALT 21 0 - 44 U/L   Alkaline Phosphatase 63 38 - 126 U/L   Total Bilirubin 0.8 0.0 - 1.2 mg/dL   GFR, Estimated >39 >39 mL/min    Comment: (NOTE) Calculated using the CKD-EPI Creatinine Equation (2021)    Anion gap 9 5 - 15    Comment: Performed at [facility], [address], [place], [place] [address]  Troponin I (High Sensitivity)     Status: None   Collection Time: 05/12/24  6:00 PM  Result Value Ref Range   Troponin I (High Sensitivity) 7 <18 ng/L    Comment: (NOTE) Elevated high sensitivity troponin I (hsTnI) values and significant  changes across serial measurements may suggest ACS but many other  chronic and acute conditions are known to elevate hsTnI results.  Refer to the Links section for chest pain algorithms and additional  guidance. Performed at [facility], [address] [place], [place] [address]   CBG monitoring, ED     Status: Abnormal   Collection Time: 05/12/24  6:11 PM  Result Value Ref Range   Glucose-Capillary 120 (H) 70 - 99 mg/dL    Comment: Glucose reference range applies only to samples taken after fasting for at least 8 hours.  Urinalysis, Routine w reflex microscopic -Urine, Clean Catch     Status: Abnormal   Collection Time: 05/12/24  6:36 PM  Result Value Ref Range    Color, Urine YELLOW YELLOW   APPearance CLEAR CLEAR   Specific Gravity, Urine 1.033 (H) 1.005 - 1.030   pH 6.0 5.0 - 8.0   Glucose, UA NEGATIVE NEGATIVE mg/dL   Hgb urine dipstick NEGATIVE NEGATIVE   Bilirubin Urine NEGATIVE NEGATIVE   Ketones, ur NEGATIVE NEGATIVE mg/dL   Protein, ur NEGATIVE NEGATIVE mg/dL   Nitrite NEGATIVE NEGATIVE   Leukocytes,Ua NEGATIVE NEGATIVE    Comment: Performed at [facility], [address] [place], [place] [address]  Troponin I (High Sensitivity)     Status: None   Collection Time: 05/12/24  7:55 PM  Result Value Ref Range   Troponin I (High Sensitivity) 8 <18 ng/L    Comment: (NOTE) Elevated high sensitivity troponin I (hsTnI) values and significant  changes across serial measurements may suggest ACS but many other  chronic and acute conditions are known to elevate hsTnI results.  Refer to the Links section for chest pain algorithms and additional  guidance. Performed at [facility], [address], [place], [place] [address]    CT ANGIO HEAD NECK W WO CM W PERF (CODE STROKE) Addendum Date: 05/12/2024 ADDENDUM REPORT: 05/12/2024 18:56 ADDENDUM: 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 Daralene, who verbally acknowledged these results. Electronically Signed   By: Donnice Mania M.D.   On: 05/12/2024 18:56   Result Date: 05/12/2024 CLINICAL DATA:  Code stroke, neuro deficit, memory issues, slurred speech, right-sided facial droop. EXAM: CT ANGIOGRAPHY HEAD AND NECK CT PERFUSION BRAIN TECHNIQUE: Multidetector CT imaging of the head and neck was performed using the standard protocol during bolus administration of  intravenous contrast. Multiplanar CT image reconstructions and MIPs were obtained to evaluate the vascular anatomy. Carotid stenosis measurements (when applicable) are obtained utilizing NASCET criteria, using the  distal internal carotid diameter as the denominator. Multiphase CT imaging of the brain was performed following IV bolus contrast injection. Subsequent parametric perfusion maps were calculated using RAPID software. RADIATION DOSE REDUCTION: This exam was performed according to the departmental dose-optimization program which includes automated exposure control, adjustment of the mA and/or kV according to patient size and/or use of iterative reconstruction technique. CONTRAST:  OMNIPAQUE  IOHEXOL  350 MG/ML SOLN COMPARISON:  Same-day head CT.  CTA head 12/14/2019. FINDINGS: CTA NECK FINDINGS Aortic arch: Standard configuration of the aortic arch. Imaged portion shows no evidence of aneurysm or dissection. Mild atherosclerosis of the partially visualized aortic arch. Pulmonary arteries: Not well visualized. Subclavian arteries: The right subclavian artery is patent proximally. There is occlusion of the proximal left subclavian artery proximal to the origin of the left vertebral artery. Mild atherosclerosis noted. Right carotid system: Patent from the origin to the skull base. Calcified and noncalcified atherosclerotic plaque at the carotid bifurcation resulting in approximately 50% stenosis. Left carotid system: Patent from the origin to the distal common carotid artery. At the carotid bifurcation there is prominent calcified and noncalcified atherosclerotic plaque with occlusion of the internal carotid artery at its origin. The external carotid artery branches are patent. Vertebral arteries: Codominant. No evidence of dissection, stenosis (50% or greater), or occlusion. Skeleton: No acute or aggressive finding noted. Other neck: The visualized airway is patent. No cervical lymphadenopathy. Upper chest: Visualized lung apices are clear. Review of the MIP images confirms the above findings CTA HEAD FINDINGS ANTERIOR CIRCULATION: The internal carotid artery is occluded from the cervical segment to the paraclinoid  segment. Reconstitution at the clinoid segment with the vessel remains patent to the ICA terminus. The right ICA is patent from the cervical segment to the ICA terminus. Atherosclerosis throughout the right ICA with moderate stenosis of the paraclinoid segment. MCAs: The left M1 segment is patent proximally. There is abrupt occlusion of a proximal M2 branch of the left MCA. Dominant left M2 inferior division branch appears patent. There are additional M2 superior vision branches of the left MCA which appear occluded more distally. The right MCA is patent. ACAs: The anterior cerebral arteries are patent bilaterally. POSTERIOR CIRCULATION: No significant stenosis, proximal occlusion, aneurysm, or vascular malformation. PCAs: Patent bilaterally. Severe stenosis of the P1 segment of the left PCA. Pcomm: Not well visualized. SCAs: The superior cerebellar arteries are patent bilaterally. Basilar artery: Patent AICAs: Not well visualized. PICAs: Patent Vertebral arteries: The intracranial vertebral arteries are patent. Venous sinuses: As permitted by contrast timing, patent. Anatomic variants: None Review of the MIP images confirms the above findings CT Brain Perfusion Findings: ASPECTS: 10 CBF (<30%) Volume: 6mL Perfusion (Tmax>6.0s) volume: Mismatch Volume: Infarction Location:Left frontal operculum within the anterior aspect of the left MCA territory. There are additional surrounding areas of elevated T-max throughout the left MCA territory most pronounced within the anterior aspect of the left frontal operculum. IMPRESSION: Occlusion of a proximal M2 branch of the left MCA. Multiple additional M2 superior division branches are occluded more distally. Core infarct within the left frontal operculum within the anterior aspect of the left MCA territory. Significant region of surrounding elevated T-max concerning for at-risk tissue. Occlusion of the left internal carotid artery from the origin to the paraclinoid  segment. Intracranial atherosclerosis as above. Moderate stenosis of the right  paraclinoid ICA. Severe stenosis and possible short segment occlusion of the P1 segment left PCA. Atherosclerosis at the right carotid bifurcation resulting in approximately 50% stenosis. 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 Aortic Atherosclerosis (ICD10-I70.0). These results were communicated to D. Wolfe At 6:15 pm on 05/12/2024 by text page via the [facility] messaging system. Electronically Signed: By: Donnice Mania M.D. On: 05/12/2024 18:32   CT HEAD CODE STROKE WO CONTRAST Result Date: 05/12/2024 CLINICAL DATA:  Code stroke. Neuro deficit, concern for stroke, memory issues, slurred speech, right-sided facial droop. EXAM: CT HEAD WITHOUT CONTRAST TECHNIQUE: Contiguous axial images were obtained from the base of the skull through the vertex without intravenous contrast. RADIATION DOSE REDUCTION: This exam was performed according to the departmental dose-optimization program which includes automated exposure control, adjustment of the mA and/or kV according to patient size and/or use of iterative reconstruction technique. COMPARISON:  CT head 12/15/2019. FINDINGS: Brain: No acute intracranial hemorrhage. Encephalomalacia in the left parietal lobe suggestive of remote infarct. No significant edema, mass effect, or midline shift. The basilar cisterns are patent. Posterior fossa is unremarkable. No extra-axial fluid collections. Ventricles: The ventricles are normal. Vascular: Abnormal hyperattenuation along the distal M1/proximal M2 segments of the left MCA. No unexpected calcification. Skull: No acute or aggressive finding. Orbits: Orbits are symmetric. Sinuses: The visualized paranasal sinuses are clear. Other: Mastoid air cells are clear. ASPECTS [facility] Stroke Program Early CT Score) -  Ganglionic level infarction (caudate, lentiform nuclei, internal capsule, insula, M1-M3 cortex): 7 - Supraganglionic infarction (M4-M6 cortex): 3 Total score (0-10 with 10 being normal): 10 IMPRESSION: 1. No acute intracranial hemorrhage. 2. Abnormal hyperattenuation along the distal M1 and proximal M2 segment of the left MCA concerning for thrombus. 3. ASPECTS is 10 These results were communicated to Dr. Jerri at 6:12 pm on 05/12/2024 by text page via the [facility] messaging system. Electronically Signed   By: Donnice Mania M.D.   On: 05/12/2024 18:13    Pending Labs Unresulted Labs (From admission, onward)     Start     Ordered   05/13/24 0500  Lipid panel  (Labs)  Tomorrow morning,   R       Comments: Fasting    05/12/24 1950   05/13/24 0500  Hemoglobin A1c  (Labs)  Tomorrow morning,   R       Comments: To assess prior glycemic control    05/12/24 1950   05/12/24 1739  Urine rapid drug screen (hosp performed)  Once,   STAT        05/12/24 1739            Vitals/Pain Today's Vitals   05/12/24 1900 05/12/24 1915 05/12/24 2000 05/12/24 2030  BP: (!) 192/91  (!) 202/89 (!) 180/92  Pulse: 72 69 69 60  Resp:  15 15 (!) 23  Temp:      TempSrc:      SpO2: 96% 96% 96% 95%  Weight:      Height:      PainSc:        Isolation Precautions No active isolations  Medications Medications  sodium chloride  0.9 % bolus 500 mL (0 mLs Intravenous Stopped 05/12/24 1830)    Followed by  0.9 %  sodium chloride  infusion ( Intravenous New Bag/Given 05/12/24 1830)  aspirin  suppository 300 mg ( Rectal See Alternative 05/12/24 1956)    Or  aspirin  EC tablet 325 mg (325 mg Oral Given 05/12/24 1956)   stroke: early stages of recovery book (has no administration in time range)  iohexol  (OMNIPAQUE ) 350 MG/ML injection 100 mL (100 mLs Intravenous Contrast Given 05/12/24 1800)    Mobility walks with person assist     Focused Assessments Neuro Assessment Handoff:  Swallow screen pass? Yes  Cardiac  Rhythm: Normal sinus rhythm NIH Stroke Scale  Dizziness Present: No Headache Present: No Interval: Other (Comment) (q2) Level of Consciousness (1a.)   : Alert, keenly responsive LOC Questions (1b. )   : Answers both questions correctly LOC Commands (1c. )   : Performs both tasks correctly Best Gaze (2. )  : Normal Visual (3. )  : No visual loss Facial Palsy (4. )    : Minor paralysis (right) Motor Arm, Left (5a. )   : No drift Motor Arm, Right (5b. ) : No drift Motor Leg, Left (6a. )  : No drift Motor Leg, Right (6b. ) : No drift Limb Ataxia (7. ): Absent Sensory (8. )  : Mild-to-moderate sensory loss, patient feels pinprick is less sharp or is dull on the affected side, or there is a loss of superficial pain with pinprick, but patient is aware of being touched (right arm/leg) Best Language (9. )  : Severe aphasia Dysarthria (10. ): Mild-to-moderate dysarthria, patient slurs at least some words and, at worst, can be understood with some difficulty Extinction/Inattention (11.)   : No Abnormality Complete NIHSS TOTAL: 5     Neuro Assessment: Exceptions to WDL Neuro Checks:   Initial (05/12/24 1815)  Has TPA been given? No If patient is a Neuro Trauma and patient is going to OR before floor call report to 4N Charge nurse: [phone] or [phone]   R Recommendations: See Admitting Provider Note  Report given to:   Additional Notes: Family at bedside, does not plan to stay the night.

## 2024-05-13 ENCOUNTER — Ambulatory Visit (HOSPITAL_BASED_OUTPATIENT_CLINIC_OR_DEPARTMENT_OTHER): Admitting: Pulmonary Disease

## 2024-05-13 ENCOUNTER — Inpatient Hospital Stay (HOSPITAL_COMMUNITY)

## 2024-05-13 ENCOUNTER — Encounter (HOSPITAL_BASED_OUTPATIENT_CLINIC_OR_DEPARTMENT_OTHER): Payer: Self-pay

## 2024-05-13 ENCOUNTER — Encounter (HOSPITAL_COMMUNITY): Payer: Self-pay | Admitting: Internal Medicine

## 2024-05-13 DIAGNOSIS — I6522 Occlusion and stenosis of left carotid artery: Secondary | ICD-10-CM

## 2024-05-13 DIAGNOSIS — I6602 Occlusion and stenosis of left middle cerebral artery: Secondary | ICD-10-CM | POA: Diagnosis not present

## 2024-05-13 DIAGNOSIS — R4701 Aphasia: Secondary | ICD-10-CM | POA: Diagnosis not present

## 2024-05-13 DIAGNOSIS — I639 Cerebral infarction, unspecified: Secondary | ICD-10-CM | POA: Diagnosis not present

## 2024-05-13 DIAGNOSIS — I6389 Other cerebral infarction: Secondary | ICD-10-CM | POA: Diagnosis not present

## 2024-05-13 DIAGNOSIS — I69398 Other sequelae of cerebral infarction: Secondary | ICD-10-CM | POA: Diagnosis not present

## 2024-05-13 DIAGNOSIS — I6782 Cerebral ischemia: Secondary | ICD-10-CM | POA: Diagnosis not present

## 2024-05-13 DIAGNOSIS — G9389 Other specified disorders of brain: Secondary | ICD-10-CM | POA: Diagnosis not present

## 2024-05-13 LAB — ECHOCARDIOGRAM COMPLETE
Area-P 1/2: 3.6 cm2
Calc EF: 56.7 %
Height: 69 in
P 1/2 time: 580 ms
S' Lateral: 4.1 cm
Single Plane A2C EF: 59 %
Single Plane A4C EF: 54.5 %
Weight: 4451.53 [oz_av]

## 2024-05-13 LAB — HIV ANTIBODY (ROUTINE TESTING W REFLEX): HIV Screen 4th Generation wRfx: NONREACTIVE

## 2024-05-13 LAB — LIPID PANEL
Cholesterol: 69 mg/dL (ref 0–200)
HDL: 34 mg/dL — ABNORMAL LOW (ref >40)
LDL Cholesterol: 16 mg/dL (ref 0–99)
Total CHOL/HDL Ratio: 2 ratio
Triglycerides: 93 mg/dL (ref <150)
VLDL: 19 mg/dL (ref 0–40)

## 2024-05-13 LAB — HEMOGLOBIN A1C
Hgb A1c MFr Bld: 5.3 % (ref 4.8–5.6)
Mean Plasma Glucose: 105.41 mg/dL

## 2024-05-13 MED ORDER — ASPIRIN 81 MG PO CHEW: 324.0000 mg | CHEWABLE_TABLET | Freq: Every day | ORAL | Status: DC

## 2024-05-13 MED ORDER — ASPIRIN 81 MG PO CHEW: 81.0000 mg | CHEWABLE_TABLET | Freq: Every day | ORAL | Status: DC

## 2024-05-13 MED ORDER — TICAGRELOR 90 MG PO TABS
90.0000 mg | ORAL_TABLET | Freq: Two times a day (BID) | ORAL | Status: DC
  Administered 2024-05-13: 90 mg via ORAL
  Filled 2024-05-13: qty 1

## 2024-05-13 MED ORDER — TICAGRELOR 90 MG PO TABS: 90.0000 mg | ORAL_TABLET | Freq: Two times a day (BID) | ORAL | 0 refills | Status: AC

## 2024-05-13 NOTE — Plan of Care (Signed)
  Problem: Education: Goal: Knowledge of disease or condition will improve Outcome: Progressing   Problem: Education: Goal: Knowledge of disease or condition will improve Outcome: Progressing   

## 2024-05-13 NOTE — Evaluation (Signed)
 Physical Therapy Evaluation Patient Details Name: Brandon [name] MRN: [id number] DOB: 1971/06/05 Today's Date: 05/13/2024  History of Present Illness  Brandon [name] is a 53 y.o. male with medical history significant for BPH, coronary artery disease, OSA, depression, hypertension.  Patient presented to the ED with reports of slurred speech and memory impairment.  Per notes - last night patient was driving, and complained to stepdaughter that he had vision changes and is not able to find his way back home.  Daughter tells me now that patient's memory was okay yesterday, it was raining heavily and that was why patient did not know where he was.  Patient symptoms suddenly started at 4.30pm today-per his roommate.  Prior to this he had been okay all day.  Symptoms have improved some since onset today.     Patient has a history of stroke in 2021, for symptoms this time are most severe.  He has been compliant with aspirin .  He still smokes cigarettes.   Clinical Impression  Patient agreeable to and tolerate OT/PT co-evaluation well. On this date, patient is at/near his baseline of functioning. Independent with all bed mobility, functional transfers, and  ambulation, all without an AD. Patient demonstrates good strength bilaterally in LE. Very mild deficits in LE coordination testing. Good return for functional transfer onto/off commode. No overt LOB or unsteadiness while ambulating. Patient does not present with urgent need for skilled physical therapy at this time. Patient discharged to care of nursing for ambulation daily as tolerated for length of stay.      If plan is discharge home, recommend the following:     Can travel by private vehicle        Equipment Recommendations None recommended by PT  Recommendations for Other Services       Functional Status Assessment Patient has had a recent decline in their functional status and demonstrates the ability to make significant improvements in  function in a reasonable and predictable amount of time.     Precautions / Restrictions Precautions Precautions: Fall Recall of Precautions/Restrictions: Intact Restrictions Weight Bearing Restrictions Per Provider Order: No      Mobility  Bed Mobility Overal bed mobility: Independent     General bed mobility comments: W/ HOB flat, no use of railings    Transfers Overall transfer level: Independent Equipment used: None       General transfer comment: STS from EOB and commode. no AD    Ambulation/Gait Ambulation/Gait assistance: Supervision, Modified independent (Device/Increase time) Gait Distance (Feet): 150 Feet Assistive device: None Gait Pattern/deviations: WFL(Within Functional Limits) Gait velocity: WNL     General Gait Details: Pt reports he feels he is walking slower than usual but still good speed. No overt LOB or unsteadiness to note. Pt navigates obstacles in hallway well.  Stairs     Wheelchair Mobility     Tilt Bed    Modified Rankin (Stroke Patients Only)       Balance Overall balance assessment: Mild deficits observed, not formally tested         Pertinent Vitals/Pain Pain Assessment Pain Assessment: No/denies pain    Home Living Family/patient expects to be discharged to:: Private residence Living Arrangements: Non-relatives/Friends;Other relatives Available Help at Discharge: Family;Available PRN/intermittently Type of Home: House Home Access: Stairs to enter Entrance Stairs-Rails: Can reach both Entrance Stairs-Number of Steps: 2   Home Layout: One level Home Equipment: Pharmacist, hospital (2 wheels);Cane - single point;Wheelchair - manual;BSC/3in1 Additional Comments: Has a roommate. Reports family  are available prn, specifically mentions son-in-law.    Prior Function Prior Level of Function : Independent/Modified Independent;Driving             Mobility Comments: Community ambulator, no AD. Still driving ADLs  Comments: Independent     Extremity/Trunk Assessment   Upper Extremity Assessment Upper Extremity Assessment: Defer to OT evaluation    Lower Extremity Assessment Lower Extremity Assessment: Overall WFL for tasks assessed (No stark difference between R/LE strength testing. Mild difficulty noted with heel to shin and alternating foot taps mildly slow but WNL. RLE more sensitive to light touch and/or dec sensation on LLE.)    Cervical / Trunk Assessment Cervical / Trunk Assessment: Normal  Communication   Communication Communication: Impaired Factors Affecting Communication: Difficulty expressing self;Reduced clarity of speech (Expressive aphasia, difficulty with word finding at times)    Cognition Arousal: Alert Behavior During Therapy: WFL for tasks assessed/performed   PT - Cognitive impairments: No apparent impairments       Following commands: Intact       Cueing Cueing Techniques: Verbal cues, Visual cues     General Comments      Exercises     Assessment/Plan    PT Assessment Patient does not need any further PT services  PT Problem List         PT Treatment Interventions      PT Goals (Current goals can be found in the Care Plan section)  Acute Rehab PT Goals Patient Stated Goal: Return home with appropriate services PT Goal Formulation: With patient Time For Goal Achievement: 05/15/24 Potential to Achieve Goals: Good    Frequency       Co-evaluation PT/OT/SLP Co-Evaluation/Treatment: Yes Reason for Co-Treatment: To address functional/ADL transfers PT goals addressed during session: Mobility/safety with mobility         AM-PAC PT 6 Clicks Mobility  Outcome Measure Help needed turning from your back to your side while in a flat bed without using bedrails?: None Help needed moving from lying on your back to sitting on the side of a flat bed without using bedrails?: None Help needed moving to and from a bed to a chair (including a  wheelchair)?: None Help needed standing up from a chair using your arms (e.g., wheelchair or bedside chair)?: None Help needed to walk in hospital room?: None Help needed climbing 3-5 steps with a railing? : None 6 Click Score: 24    End of Session   Activity Tolerance: Patient tolerated treatment well Patient left: in bed;with call bell/phone within reach   PT Visit Diagnosis: Other abnormalities of gait and mobility (R26.89);Other symptoms and signs involving the nervous system (R29.898)    Time: 9062-9049 PT Time Calculation (min) (ACUTE ONLY): 13 min   Charges:   PT Evaluation $PT Eval Low Complexity: 1 Low PT Treatments $Therapeutic Activity: 8-22 mins PT General Charges $$ ACUTE PT VISIT: 1 Visit        12:16 PM, 05/13/24 Anelisse Jacobson Powell-Butler, PT, DPT [place] with [facility]

## 2024-05-13 NOTE — Progress Notes (Signed)
 Patient gone down for MRI,orders received to saline lock IV fluids, while going to MRI,and patient may come off telemetry as well per Dr Bryn orders.Plan of care on going.

## 2024-05-13 NOTE — Evaluation (Signed)
 Speech Language Pathology Evaluation Patient Details Name: Brandon [name] MRN: [id number] DOB: 07/26/1971 Today's Date: 05/13/2024 Time: 8799-8765 SLP Time Calculation (min) (ACUTE ONLY): 34 min  Problem List:  Patient Active Problem List   Diagnosis Date Noted   Expressive aphasia 05/12/2024   Chronic rhinitis 04/28/2024   Deviated nasal septum 04/28/2024   Hypertrophy of nasal turbinates 04/28/2024   Conductive hearing loss in left ear 04/28/2024   Other specified disorders of eustachian tube, left ear 04/28/2024   Major depressive disorder, recurrent episode, moderate (HCC) 03/18/2024   Tubular adenoma 02/19/2024   History of colonic polyps 10/25/2023   Coronary artery disease 07/30/2023   History of COVID-19 06/08/2021   Grade II internal hemorrhoids    Adenomatous polyp of sigmoid colon    Adenomatous polyp of descending colon    Rectal polyp    Difficult airway for intubation 08/19/2020   Restrictive lung disease 04/30/2020   OSA (obstructive sleep apnea) 04/30/2020   History of MI (myocardial infarction) 02/25/2020   Atherosclerosis of aorta (HCC) 02/25/2020   Subclavian artery stenosis (HCC) 02/09/2020   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   BPH (benign prostatic hyperplasia) 12/17/2019   Internal carotid artery occlusion, left 12/15/2019   History of kidney stones 03/21/2019   Genital warts 01/23/2019   Antiplatelet or antithrombotic long-term use 01/23/2019   History of renal calculi 01/22/2019   Bilateral hearing loss 12/17/2017   ETD (Eustachian tube dysfunction), bilateral 12/17/2017   Depression, major, in remission (HCC) 12/29/2012   Sleep apnea 12/29/2012   Arteriosclerotic cardiovascular disease (ASCVD)    Tobacco abuse    Hyperlipidemia 05/30/2011   Hypertension 05/30/2011   Morbid obesity (HCC) 05/30/2011   Past Medical History:  Past Medical History:  Diagnosis Date   Arteriosclerotic cardiovascular  disease (ASCVD) 2006, 2012   2006-acute IMI treated with urgent RCA stent; 2007-[place] for in-stent restenosis; 02/2010-presented with ACS and minimal troponin elevation:70% LAD, 80% distal circumflex, 80% proximal ramus branch vessel,in-stent restenosis of 70% in the RCA; BMS for proximal critical RCA stenosis, restenosis Nov 2012   Carotid artery occlusion    Difficult airway for intubation    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, due to reduced neck mobility   Heart attack (HCC)    History of noncompliance with medical treatment    Due to financial considerations   Hyperlipidemia    Hypertension    Stroke (HCC)    Tobacco abuse    40 pack years   Past Surgical History:  Past Surgical History:  Procedure Laterality Date   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   Procedure: BYPASS GRAFT CAROTID-SUBCLAVIAN USING HEMASHIELD GOLD GRAFT;  Surgeon: Harvey Carlin FORBES, MD;  Location: [facility] OR;  Service: Vascular;  Laterality: Left;   COLONOSCOPY N/A 02/18/2024   Procedure: COLONOSCOPY;  Surgeon: San Sandor GAILS, DO;  Location: [facility] ENDOSCOPY;  Service: Gastroenterology;  Laterality: N/A;   COLONOSCOPY WITH PROPOFOL  N/A 09/21/2020   Procedure: COLONOSCOPY WITH PROPOFOL ;  Surgeon: San Sandor GAILS, DO;  Location: [facility];  Service: Gastroenterology;  Laterality: N/A;   CORONARY ANGIOPLASTY WITH STENT PLACEMENT     HEMOSTASIS CLIP PLACEMENT  09/21/2020   Procedure: HEMOSTASIS CLIP PLACEMENT;  Surgeon: San Sandor GAILS, DO;  Location: [facility];  Service: Gastroenterology;;   IR ANGIO EXTERNAL CAROTID SEL EXT CAROTID BILAT MOD SED  12/15/2019   IR ANGIO VERTEBRAL SEL VERTEBRAL UNI R MOD SED  12/15/2019   IR ANGIOGRAM EXTREMITY LEFT  12/15/2019   IR ANGIOGRAM EXTREMITY LEFT  12/15/2019   IR CT HEAD LTD  12/15/2019   IR PERCUTANEOUS ART THROMBECTOMY/INFUSION INTRACRANIAL INC DIAG ANGIO  12/15/2019   POLYPECTOMY  09/21/2020   Procedure: POLYPECTOMY;  Surgeon: San Sandor GAILS, DO;  Location: [facility];  Service: Gastroenterology;;   POLYPECTOMY  02/18/2024   Procedure: POLYPECTOMY, INTESTINE;  Surgeon: San Sandor GAILS, DO;  Location: [facility];  Service: Gastroenterology;;   RADIOLOGY WITH ANESTHESIA N/A 12/15/2019   Procedure: IR WITH ANESTHESIA;  Surgeon: Dolphus Carrion, MD;  Location: [facility] OR;  Service: Radiology;  Laterality: N/A;   HPI:  Patient is a 53 y.o. male admitted 7/21 after presenting to the ED with reports of memory impairment and slurred speech. PMHx significant for: CVA (2021), BPH, HTN, depression, CAD. Pt known to ST service - SLE 12/23/19 reports mild cognitive communication deficits following first CVA. At that time, pt reported baseline trouble with STM with use of strategies such as writing information down to assist with recall. Nurse notes expressive aphasia during conversation.      CT Head 7/21: 1. No acute intracranial hemorrhage. 2. Abnormal hyperattenuation along the distal M1 and proximal M2  segment of the left MCA concerning for thrombus. 3. ASPECTS is 10   Assessment / Plan / Recommendation Clinical Impression  Patient presents with a mild transcortical motor aphasia characterized by difficulties with word retrieval, hesitant/halting speech mostly with longer utterances, phonemic paraphasias, and perseveration. Auditory comprehension and repetition are intact. He also presents with a mild dysarthria with reduced rate of speech and imprecise articulation leading to slightly reduced speech intelligibility in conversation (~85%). Voice noted to be hoarse and gravely, which pt states is normal at his baseline. Resonance was WNL. Oral mech revealed impaired motor planning (DDK = 1 repetition/second) and slightly reduced R lingual strength.   The Quick Aphasia Battery was administered with the following results: Command Following - 100% Connected Speech - impairment evident, but can discuss all topics Word comprehension -  100% Sentence comprehension - 100% Picture naming - 80% Repetition - 80%; most difficulty was with phrases/sentences Reading - 40%; trouble with multisyllabic words and sentences, aware of errors and attempted to self-correct Motor speech - dysarthria, slow and sometimes halting rate, abnormal DDK rate   ST services warranted to address communication and motor speech skills. Pt may benefit from OP Speech Therapy if symptoms persist during acute stay.     SLP Assessment  SLP Recommendation/Assessment: Patient needs continued Speech Language Pathology Services SLP Visit Diagnosis: Dysarthria and anarthria (R47.1);Aphasia (R47.01)     Assistance Recommended at Discharge  None  Functional Status Assessment Patient has had a recent decline in their functional status and demonstrates the ability to make significant improvements in function in a reasonable and predictable amount of time.  Frequency and Duration min 1 x/week  1 week      SLP Evaluation Cognition  Overall Cognitive Status: Within Functional Limits for tasks assessed Arousal/Alertness: Awake/alert Orientation Level: Oriented X4 Year: 2025 Month: July Memory: Appears intact Awareness: Appears intact Problem Solving: Appears intact Safety/Judgment: Appears intact       Comprehension  Auditory Comprehension Overall Auditory Comprehension: Appears within functional limits for tasks assessed Yes/No Questions: Within Functional Limits Commands: Within Functional Limits Conversation: Complex Interfering Components: Ecologist Discrimination: Within Function Limits Reading Comprehension Reading Status: Within funtional limits    Expression Expression Primary Mode of Expression: Verbal Verbal Expression Overall Verbal Expression: Impaired Initiation: No impairment Level  of Generative/Spontaneous Verbalization: Conversation Repetition: Impaired Level of Impairment: Phrase  level;Sentence level Naming: Impairment Responsive: 76-100% accurate Verbal Errors: Phonemic paraphasias;Perseveration;Aware of errors Pragmatics: No impairment Effective Techniques: Other (Comment) (stimultanous production, clinical model first) Non-Verbal Means of Communication: Not applicable Written Expression Written Expression: Not tested   Oral / Motor  Oral Motor/Sensory Function Overall Oral Motor/Sensory Function: Mild impairment Facial ROM: Suspected CN VII (facial) dysfunction Facial Symmetry: Abnormal symmetry right Facial Strength: Within Functional Limits Facial Sensation: Within Functional Limits Lingual ROM: Within Functional Limits Lingual Symmetry: Within Functional Limits Lingual Strength: Reduced Lingual Sensation: Within Functional Limits Velum: Within Functional Limits Mandible: Within Functional Limits Motor Speech Overall Motor Speech: Impaired Respiration: Within functional limits Phonation: Normal;Hoarse Resonance: Within functional limits Articulation: Impaired Level of Impairment: Phrase Intelligibility: Intelligibility reduced Word: 75-100% accurate Phrase: 75-100% accurate Sentence: 75-100% accurate Conversation: 75-100% accurate Motor Planning: Impaired Level of Impairment: Phrase Motor Speech Errors: Aware;Inconsistent            Waddell JONETTA Novak, MA CCC-SLP 05/13/2024, 12:51 PM

## 2024-05-13 NOTE — Progress Notes (Signed)
 Patient receiving 2D echo,unable to do NIHSS and vital signs at this time.Plan of care on going.

## 2024-05-13 NOTE — Progress Notes (Signed)
 Patient discharged home with instructions given on medications ,and follow up visits,verbalized understanding . Prescriptions sent to Pharmacy of choice documented on AVS.  IV discontinued,catheter intact. Accompanied by staff to an awaiting vehicle.

## 2024-05-13 NOTE — Care Management CC44 (Signed)
 Condition Code 44 Documentation Completed  Patient Details  Name: Brandon [name] MRN: [id number] Date of Birth: Apr 16, 1971   Condition Code 44 given:  Yes Patient signature on Condition Code 44 notice:  Yes Documentation of 2 MD's agreement:  Yes Code 44 added to claim:  Yes    Noreen KATHEE Pinal, LCSWA 05/13/2024, 3:41 PM

## 2024-05-13 NOTE — Discharge Summary (Signed)
 Physician Discharge Summary   Patient: Brandon [name] MRN: [id number] DOB: 13-Apr-1971  Admit date:     05/12/2024  Discharge date: 05/13/24  Discharge Physician: Bernardino KATHEE Come   PCP: Joyce Norleen BROCKS, MD   Recommendations at discharge:  Continue routine follow up with PCP in the next 1-2 weeks.  Note that, due to severe intracranial atherosclerotic burden, goal SBP is 120-117mmHg, no lower.  LDL is 16, continue repatha , statin, zetia .  Continue tobacco cessation counseling Follow up with cardiology for outpatient cardiac monitoring after discharge Follow up with [facility] in 8 weeks, referral placed.   Discharge Diagnoses: Principal Problem:   Expressive aphasia Active Problems:   Hypertension   Morbid obesity (HCC)   Tobacco abuse   Depression, major, in remission [facility])   Sleep apnea   Internal carotid artery occlusion, left  Hospital Course: HPI: Brandon [name] is a 53 y.o. male with medical history significant for BPH, coronary artery disease, OSA, depression, hypertension. Patient presented to the ED with reports of slurred speech and memory impairment.  Per notes - last night patient was driving, and complained to stepdaughter that he had vision changes and is not able to find his way back home.  Daughter tells me now that patient's memory was okay yesterday, it was raining heavily and that was why patient did not know where he was.  Patient symptoms suddenly started at 4.30pm today-per his roommate.  Prior to this he had been okay all day.  Symptoms have improved some since onset today.   Patient has a history of stroke in 2021, for symptoms this time are most severe.  He has been compliant with aspirin .  He still smokes cigarettes.    ED Course: Blood pressure 170s to 202 systolic. CT no acute intracranial hemorrhage, Abnormal hyperattenuation along the distal M1 and proximal M2 segment of the left MCA concerning for thrombus. CTA head and neck- Core infarct within the left  frontal operculum within the anterior aspect of the left MCA territory. Significant region of surrounding elevated T-max concerning for at-risk tissue.  See detailed report on occlusions and stenosis. Evaluated by teleneurologist Dr. Leane outside window for intervention, and not IR candidate given chronic left ICA occlusion with unsuccessful attempt 4 years ago.  Recommended MRI brain.  Allow for permissive hypertension.  Brain MRI showed Acute infarct in the left frontal operculum and left insular cortex. Additional scattered areas of acute infarct involving the cortex and subcortical white matter within the left frontoparietal lobes. Chronic left ICA occlusion noted. Abnormal susceptibility along the distal M1/proximal M2 segments of the left MCA suggestive of thrombus. Remote infarct in the left parietal lobe. Chronic microvascular ischemic changes.  Neurology was consulted, recommendations conveyed below  Assessment and Plan: Expressive aphasia due to acute CVA:  - CT - Abnormal hyperattenuation along the distal M1 and proximal M2 segment of the left MCA concerning for thrombus. - CTA head and neck- Core infarct within the left frontal operculum within the anterior aspect of the left MCA territory. Significant region of surrounding elevated T-max concerning for at-risk tissue.  See detailed report on occlusions and stenosis. - MRI brain - Acute infarct in the left frontal operculum and left insular cortex. Additional scattered areas of acute infarct involving the cortex and subcortical white matter within the left frontoparietal lobes. Chronic left ICA occlusion noted. Abnormal susceptibility along the distal M1/proximal M2 segments of the left MCA suggestive of thrombus. Remote infarct in the left parietal lobe. Chronic microvascular  ischemic changes. - Evaluated by teleneurologist Dr. Jerri- Patient not TNK candidate given just outside window. Case also discussed at length including with IR,  given prior unsuccessful attempt, not a candidate for IR intervention, given chronic left ICA occlusion with unsuccessful attempt 4 years ago, no access to left MCA and with relatively mild stroke symptoms.  - Per neurology, will Rx aspirin  81 mg daily and Brilinta  90 mg twice daily for 30 days followed by full dose aspirin  35 mg daily - LDL 16.  Continue current management - TTE ordered and pending.  If negative recommend cardiac monitor to look for paroxysmal A-fib -Requested interventional radiology to review his CT 1 more time to see if there is anything else we can offer.  However due to previous failed attempt, this is less likely. - Goal blood pressure systolic 120s to 859d.  Please avoid hypotension - Follow-up with [facility] in 2 to 3 months - Stroke education - PT/OT/speech evaluated > no HH needs.   Depression, major, in remission (HCC) Resume Celexa    Tobacco abuse Counseled extensively to quit smoking cigarettes   Hypertension:  - BP up into 150's systolic while holding metoprolol , will restart at discharge.    Consultants: Neurology Procedures performed: Echo  Disposition: Home Diet recommendation: Heart healthy DISCHARGE MEDICATION: Allergies as of 05/13/2024   No Known Allergies      Medication List     TAKE these medications    aspirin  EC 81 MG tablet Take 1 tablet (81 mg total) by mouth daily. Swallow whole.   atorvastatin  80 MG tablet Commonly known as: LIPITOR  Take 1 tablet by mouth once daily   citalopram  20 MG tablet Commonly known as: CELEXA  Take 1 tablet (20 mg total) by mouth daily.   ezetimibe  10 MG tablet Commonly known as: ZETIA  Take 1 tablet by mouth once daily   metoprolol  succinate 50 MG 24 hr tablet Commonly known as: TOPROL -XL Take 50 mg by mouth daily.   Repatha  SureClick 140 MG/ML Soaj Generic drug: Evolocumab  INJECT 1 PEN  SUBCUTANEOUSLY EVERY 14 DAYS   ticagrelor  90 MG Tabs tablet Commonly known as:  BRILINTA  Take 1 tablet (90 mg total) by mouth 2 (two) times daily.        Follow-up Information     Joyce Norleen BROCKS, MD Follow up.   Specialty: Family Medicine Contact information: [address] [place] [address] [phone]         Rosemarie Eather RAMAN, MD Follow up.   Specialties: Neurology, Radiology Contact information: [address] [place] [address] [phone]                Discharge Exam: Fredricka Weights   05/12/24 1740 05/12/24 2123  Weight: (!) 136.1 kg 126.2 kg  BP (!) 158/94 (BP Location: Left Arm)   Pulse 60   Temp (!) 97.5 F (36.4 C) (Oral)   Resp 16   Ht 5' 9 (1.753 m)   Wt 126.2 kg   SpO2 95%   BMI 41.09 kg/m   Pleasant obese male in no distress Slowed at times stuttering speech with appropriate content, comprehension not affected, no focal neurological weakness/numbness.   Condition at discharge: stable  The results of significant diagnostics from this hospitalization (including imaging, microbiology, ancillary and laboratory) are listed below for reference.   Imaging Studies: ECHOCARDIOGRAM COMPLETE Result Date: 05/13/2024    ECHOCARDIOGRAM REPORT   Patient Name:   Brandon [name] Date of Exam: 05/13/2024 Medical Rec #:  [id number]       Height:       69.0 in Accession #:    [id number]      Weight:       278.2 lb Date of Birth:  1971/03/10       BSA:          2.377 m Patient Age:    53 years        BP:           158/72 mmHg Patient Gender: M               HR:           62 bpm. Exam Location:  [facility] Procedure: 2D Echo, Cardiac Doppler and Color Doppler (Both Spectral and Color            Flow Doppler were utilized during procedure). Indications:    Stroke  History:        Patient has prior history of Echocardiogram examinations, most                 recent 12/15/2019. Previous Myocardial Infarction and CAD; Risk                 Factors:Hypertension, Sleep Apnea, Current Smoker and                 Dyslipidemia.   Sonographer:    Ellouise Mose RDCS Referring Phys: [id number] [facility]  Sonographer Comments: Image acquisition challenging due to patient body habitus. IMPRESSIONS  1. Left ventricular ejection fraction, by estimation, is 55 to 60%. The left ventricle has normal function. The left ventricle has no regional wall motion abnormalities. The left ventricular internal cavity size was mildly to moderately dilated. There is mild left ventricular hypertrophy. Left ventricular diastolic parameters were normal.  2. Right ventricular systolic function is normal. The right ventricular size is normal. Tricuspid regurgitation signal is inadequate for assessing PA pressure.  3. The mitral valve is normal in structure. Trivial mitral valve regurgitation. No evidence of mitral stenosis.  4. Tricuspid valve regurgitation is mild to moderate.  5. The aortic valve has an indeterminant number of cusps. Aortic valve regurgitation is mild. Aortic valve sclerosis/calcification is present, without any evidence of aortic stenosis.  6. Aortic dilatation noted. There is dilatation of the ascending aorta, measuring 41 mm.  7. The inferior vena cava is normal in size with greater than 50% respiratory variability, suggesting right atrial pressure of 3 mmHg. FINDINGS  Left Ventricle: Left ventricular ejection fraction, by estimation, is 55 to 60%. The left ventricle has normal function. The left ventricle has no regional wall motion abnormalities. The left ventricular internal cavity size was mildly to moderately dilated. There is mild left ventricular hypertrophy. Left ventricular diastolic parameters were normal. Right Ventricle: The right ventricular size is normal. No increase in right ventricular wall thickness. Right ventricular systolic function is normal. Tricuspid regurgitation signal is inadequate for assessing PA pressure. Left Atrium: Left atrial size was normal in size. Right Atrium: Right atrial size was normal in size. Pericardium: There  is no evidence of pericardial effusion. Mitral Valve: The mitral valve is normal in structure. Trivial mitral valve regurgitation. No evidence of mitral valve stenosis. Tricuspid Valve: The tricuspid valve is normal in structure. Tricuspid valve regurgitation is mild to moderate. No evidence of tricuspid stenosis. Aortic Valve: The aortic valve has an indeterminant number of cusps. Aortic valve regurgitation is mild. Aortic regurgitation PHT measures 580 msec. Aortic valve sclerosis/calcification  is present, without any evidence of aortic stenosis. Pulmonic Valve: The pulmonic valve was normal in structure. Pulmonic valve regurgitation is not visualized. No evidence of pulmonic stenosis. Aorta: Aortic dilatation noted and the aortic root and ascending aorta are structurally normal, with no evidence of dilitation. There is dilatation of the ascending aorta, measuring 41 mm. Venous: The inferior vena cava is normal in size with greater than 50% respiratory variability, suggesting right atrial pressure of 3 mmHg. IAS/Shunts: No atrial level shunt detected by color flow Doppler.  LEFT VENTRICLE PLAX 2D LVIDd:         6.00 cm      Diastology LVIDs:         4.10 cm      LV e' medial:    8.92 cm/s LV PW:         1.20 cm      LV E/e' medial:  12.0 LV IVS:        1.10 cm      LV e' lateral:   10.10 cm/s LVOT diam:     2.40 cm      LV E/e' lateral: 10.6 LV SV:         118 LV SV Index:   49 LVOT Area:     4.52 cm  LV Volumes (MOD) LV vol d, MOD A2C: 130.0 ml LV vol d, MOD A4C: 139.0 ml LV vol s, MOD A2C: 53.3 ml LV vol s, MOD A4C: 63.3 ml LV SV MOD A2C:     76.7 ml LV SV MOD A4C:     139.0 ml LV SV MOD BP:      75.6 ml RIGHT VENTRICLE             IVC RV S prime:     13.30 cm/s  IVC diam: 1.90 cm TAPSE (M-mode): 2.0 cm LEFT ATRIUM             Index        RIGHT ATRIUM           Index LA diam:        3.60 cm 1.51 cm/m   RA Area:     14.40 cm LA Vol (A2C):   37.2 ml 15.65 ml/m  RA Volume:   33.20 ml  13.97 ml/m LA Vol (A4C):    34.3 ml 14.43 ml/m LA Biplane Vol: 36.0 ml 15.14 ml/m  AORTIC VALVE LVOT Vmax:   107.00 cm/s LVOT Vmean:  68.100 cm/s LVOT VTI:    0.260 m AI PHT:      580 msec  AORTA Ao Root diam: 3.80 cm Ao Asc diam:  4.05 cm MITRAL VALVE MV Area (PHT): 3.60 cm     SHUNTS MV Decel Time: 211 msec     Systemic VTI:  0.26 m MV E velocity: 107.00 cm/s  Systemic Diam: 2.40 cm MV A velocity: 83.80 cm/s MV E/A ratio:  1.28 Vina Gull MD Electronically signed by Vina Gull MD Signature Date/Time: 05/13/2024/2:32:06 PM    Final    MR BRAIN WO CONTRAST Result Date: 05/13/2024 CLINICAL DATA:  Stroke follow-up, weakness. EXAM: MRI HEAD WITHOUT CONTRAST TECHNIQUE: Multiplanar, multiecho pulse sequences of the brain and surrounding structures were obtained without intravenous contrast. COMPARISON:  CT head and CTA head and neck 05/12/2024 and 12/14/2019. FINDINGS: Brain: Restricted diffusion involving the cortex and subcortical white matter in the left frontal operculum as well as within the left insular cortex compatible with acute infarct. There are a few distal  scattered areas of restricted diffusion involving the cortex and subcortical white matter in the left frontoparietal lobes more posteriorly within the left MCA territory. Redemonstrated encephalomalacia and gliosis in the left parietal lobe compatible with prior infarct. Scattered T2/FLAIR hyperintensity in the periventricular and subcortical white matter. Susceptibility in the left thalamus suggestive of prior hemorrhage. No midline shift. Posterior fossa is unremarkable. Normal appearance of midline structures. The basilar cisterns are patent. No extra-axial fluid collections. Ventricles: Normal size and configuration of the ventricles. Vascular: Nonvisualized left ICA flow void compatible with chronic occlusion. Abnormal susceptibility along the distal left M1 and proximal M2 segments suggestive of thrombus. Skull base flow voids otherwise visualized. Skull and upper  cervical spine: No focal abnormality. Sinuses/Orbits: Orbits are symmetric. Paranasal sinuses are clear. Other: Left mastoid effusion. IMPRESSION: Acute infarct in the left frontal operculum and left insular cortex. Additional scattered areas of acute infarct involving the cortex and subcortical white matter within the left frontoparietal lobes. Chronic left ICA occlusion noted. Abnormal susceptibility along the distal M1/proximal M2 segments of the left MCA suggestive of thrombus. Remote infarct in the left parietal lobe. Chronic microvascular ischemic changes. Left mastoid effusion. Electronically Signed   By: Donnice Mania M.D.   On: 05/13/2024 13:25   CT ANGIO HEAD NECK W WO CM W PERF (CODE STROKE) Addendum Date: 05/12/2024 ADDENDUM REPORT: 05/12/2024 18:56 ADDENDUM: 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 Daralene, who verbally acknowledged these results. Electronically Signed   By: Donnice Mania M.D.   On: 05/12/2024 18:56   Result Date: 05/12/2024 CLINICAL DATA:  Code stroke, neuro deficit, memory issues, slurred speech, right-sided facial droop. EXAM: CT ANGIOGRAPHY HEAD AND NECK CT PERFUSION BRAIN TECHNIQUE: Multidetector CT imaging of the head and neck was performed using the standard protocol during bolus administration of intravenous contrast. Multiplanar CT image reconstructions and MIPs were obtained to evaluate the vascular anatomy. Carotid stenosis measurements (when applicable) are obtained utilizing NASCET criteria, using the distal internal carotid diameter as the denominator. Multiphase CT imaging of the brain was performed following IV bolus contrast injection. Subsequent parametric perfusion maps were calculated using RAPID software. RADIATION DOSE REDUCTION: This exam was performed according to the departmental dose-optimization program which includes  automated exposure control, adjustment of the mA and/or kV according to patient size and/or use of iterative reconstruction technique. CONTRAST:  OMNIPAQUE  IOHEXOL  350 MG/ML SOLN COMPARISON:  Same-day head CT.  CTA head 12/14/2019. FINDINGS: CTA NECK FINDINGS Aortic arch: Standard configuration of the aortic arch. Imaged portion shows no evidence of aneurysm or dissection. Mild atherosclerosis of the partially visualized aortic arch. Pulmonary arteries: Not well visualized. Subclavian arteries: The right subclavian artery is patent proximally. There is occlusion of the proximal left subclavian artery proximal to the origin of the left vertebral artery. Mild atherosclerosis noted. Right carotid system: Patent from the origin to the skull base. Calcified and noncalcified atherosclerotic plaque at the carotid bifurcation resulting in approximately 50% stenosis. Left carotid system: Patent from the origin to the distal common carotid artery. At the carotid bifurcation there is prominent calcified and noncalcified atherosclerotic plaque with occlusion of the internal carotid artery at its origin. The external carotid artery branches are patent. Vertebral arteries: Codominant. No evidence of dissection, stenosis (50% or greater), or occlusion. Skeleton: No acute or aggressive finding noted. Other neck: The visualized airway is patent.  No cervical lymphadenopathy. Upper chest: Visualized lung apices are clear. Review of the MIP images confirms the above findings CTA HEAD FINDINGS ANTERIOR CIRCULATION: The internal carotid artery is occluded from the cervical segment to the paraclinoid segment. Reconstitution at the clinoid segment with the vessel remains patent to the ICA terminus. The right ICA is patent from the cervical segment to the ICA terminus. Atherosclerosis throughout the right ICA with moderate stenosis of the paraclinoid segment. MCAs: The left M1 segment is patent proximally. There is abrupt occlusion of  a proximal M2 branch of the left MCA. Dominant left M2 inferior division branch appears patent. There are additional M2 superior vision branches of the left MCA which appear occluded more distally. The right MCA is patent. ACAs: The anterior cerebral arteries are patent bilaterally. POSTERIOR CIRCULATION: No significant stenosis, proximal occlusion, aneurysm, or vascular malformation. PCAs: Patent bilaterally. Severe stenosis of the P1 segment of the left PCA. Pcomm: Not well visualized. SCAs: The superior cerebellar arteries are patent bilaterally. Basilar artery: Patent AICAs: Not well visualized. PICAs: Patent Vertebral arteries: The intracranial vertebral arteries are patent. Venous sinuses: As permitted by contrast timing, patent. Anatomic variants: None Review of the MIP images confirms the above findings CT Brain Perfusion Findings: ASPECTS: 10 CBF (<30%) Volume: 6mL Perfusion (Tmax>6.0s) volume: Mismatch Volume: Infarction Location:Left frontal operculum within the anterior aspect of the left MCA territory. There are additional surrounding areas of elevated T-max throughout the left MCA territory most pronounced within the anterior aspect of the left frontal operculum. IMPRESSION: Occlusion of a proximal M2 branch of the left MCA. Multiple additional M2 superior division branches are occluded more distally. Core infarct within the left frontal operculum within the anterior aspect of the left MCA territory. Significant region of surrounding elevated T-max concerning for at-risk tissue. 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 Intracranial atherosclerosis as above. Moderate stenosis of the right paraclinoid ICA. Severe stenosis and possible short segment occlusion of the P1 segment left PCA. Atherosclerosis at the right carotid bifurcation resulting in approximately 50% stenosis. 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 Aortic Atherosclerosis (ICD10-I70.0). These results were communicated to D. Wolfe At 6:15 pm on 05/12/2024 by text page via the [facility] messaging system. Electronically Signed: By: Donnice Mania M.D. On: 05/12/2024 18:32   CT HEAD CODE STROKE WO CONTRAST Result Date: 05/12/2024 CLINICAL DATA:  Code stroke. Neuro deficit, concern for stroke, memory issues, slurred speech, right-sided facial droop. EXAM: CT HEAD WITHOUT CONTRAST TECHNIQUE: Contiguous axial images were obtained from the base of the skull through the vertex without intravenous contrast. RADIATION DOSE REDUCTION: This exam was performed according to the departmental dose-optimization program which includes automated exposure control, adjustment of the mA and/or kV according to patient size and/or use of iterative reconstruction technique. COMPARISON:  CT head 12/15/2019. FINDINGS: Brain: No acute intracranial hemorrhage. Encephalomalacia in the left parietal lobe suggestive of remote infarct. No significant edema, mass effect, or midline shift. The basilar cisterns are patent. Posterior fossa is unremarkable. No extra-axial fluid collections. Ventricles: The ventricles are normal. Vascular: Abnormal hyperattenuation along the distal M1/proximal M2 segments of the left MCA. No unexpected calcification. Skull: No acute or aggressive finding. Orbits: Orbits are symmetric. Sinuses: The visualized paranasal sinuses are clear. Other: Mastoid air cells are clear. ASPECTS [facility] Stroke Program Early CT Score) - Ganglionic  level infarction (caudate, lentiform nuclei, internal capsule, insula, M1-M3 cortex): 7 - Supraganglionic infarction (M4-M6 cortex): 3 Total score (0-10 with 10 being normal): 10 IMPRESSION: 1. No acute intracranial hemorrhage. 2. Abnormal hyperattenuation along the distal M1 and proximal M2 segment of the left MCA  concerning for thrombus. 3. ASPECTS is 10 These results were communicated to Dr. Jerri at 6:12 pm on 05/12/2024 by text page via the [facility] messaging system. Electronically Signed   By: Donnice Mania M.D.   On: 05/12/2024 18:13    Microbiology: Results for orders placed or performed during the hospital encounter of 09/17/20  SARS CORONAVIRUS 2 (TAT 6-24 HRS) Nasopharyngeal Nasopharyngeal Swab     Status: None   Collection Time: 09/17/20 11:15 AM   Specimen: Nasopharyngeal Swab  Result Value Ref Range Status   SARS Coronavirus 2 NEGATIVE NEGATIVE Final    Comment: (NOTE) SARS-CoV-2 target nucleic acids are NOT DETECTED.  The SARS-CoV-2 RNA is generally detectable in upper and lower respiratory specimens during the acute phase of infection. Negative results do not preclude SARS-CoV-2 infection, do not rule out co-infections with other pathogens, and should not be used as the sole basis for treatment or other patient management decisions. Negative results must be combined with clinical observations, patient history, and epidemiological information. The expected result is Negative.  Fact Sheet for Patients: HairSlick.no  Fact Sheet for Healthcare Providers: quierodirigir.com  This test is not yet approved or cleared by the [place]  FDA and  has been authorized for detection and/or diagnosis of SARS-CoV-2 by FDA under an Emergency Use Authorization (EUA). This EUA will remain  in effect (meaning this test can be used) for the duration of the COVID-19 declaration under Se ction 564(b)(1) of the Act, 21 U.S.C. section 360bbb-3(b)(1), unless the authorization is terminated or revoked sooner.  Performed at [facility], [address] [place], [place] [address]     Labs: CBC: Recent Labs  Lab 05/12/24 1800  WBC 7.5  NEUTROABS 3.6  HGB 15.7  HCT 45.3  MCV 92.6  PLT 153   Basic Metabolic Panel: Recent Labs  Lab  05/12/24 1800  NA 137  K 3.4*  CL 103  CO2 25  GLUCOSE 108*  BUN 15  CREATININE 0.96  CALCIUM  9.2   Liver Function Tests: Recent Labs  Lab 05/12/24 1800  AST 20  ALT 21  ALKPHOS 63  BILITOT 0.8  PROT 6.4*  ALBUMIN  3.5   CBG: Recent Labs  Lab 05/12/24 1811  GLUCAP 120*    Discharge time spent: greater than 30 minutes.  Signed: Bernardino KATHEE Come, MD [facility] 05/13/2024

## 2024-05-13 NOTE — Care Management Obs Status (Signed)
 MEDICARE OBSERVATION STATUS NOTIFICATION   Patient Details  Name: Brandon [name] MRN: [id number] Date of Birth: 1970-11-13   Medicare Observation Status Notification Given:  Yes    Noreen KATHEE Cleotilde ISRAEL 05/13/2024, 3:41 PM

## 2024-05-13 NOTE — Progress Notes (Signed)
 I connected with  Brandon [name] on 05/13/24 by a video enabled telemedicine application and verified that I am speaking with the correct person using two identifiers.   I discussed the limitations of evaluation and management by telemedicine. The patient expressed understanding and agreed to proceed.  Location of patient: [facility] Location of physician: [facility]   Subjective: No acute events overnight.  States he still has somewhat biding difficulties.  Denies any other concerns.  ROS: negative except above Examination  Vital signs in last 24 hours: Temp:  [97.6 F (36.4 C)-98.4 F (36.9 C)] 98.1 F (36.7 C) (07/22 0452) Pulse Rate:  [58-73] 58 (07/22 0452) Resp:  [13-23] 16 (07/22 0452) BP: (157-202)/(72-95) 158/72 (07/22 0452) SpO2:  [92 %-97 %] 96 % (07/22 0452) Weight:  [126.2 kg-136.1 kg] 126.2 kg (07/21 2123)  General: lying in bed, NAD Neuro: MS: Alert, oriented, follows commands CN: pupils equal and reactive,  EOMI, face symmetric, tongue midline, normal sensation over face, Motor: Antigravity without drift in all 4 extremities Sensory: Intact to light touch Coordination: normal Gait: not tested  Basic Metabolic Panel: Recent Labs  Lab 05/12/24 1800  NA 137  K 3.4*  CL 103  CO2 25  GLUCOSE 108*  BUN 15  CREATININE 0.96  CALCIUM  9.2    CBC: Recent Labs  Lab 05/12/24 1800  WBC 7.5  NEUTROABS 3.6  HGB 15.7  HCT 45.3  MCV 92.6  PLT 153     Coagulation Studies: Recent Labs    05/12/24 1800  LABPROT 14.1  INR 1.0    Imaging  CT head without contrast 05/12/2024:  No acute intracranial hemorrhage. Abnormal hyperattenuation along the distal M1 and proximal M2 segment of the left MCA concerning for thrombus.  CTA head and neck with and without contrast 05/12/2024: Occlusion of a proximal M2 branch of the left MCA. Multiple additional M2 superior division branches are occluded more distally.   Core infarct within the left  frontal operculum within the anterior aspect of the left MCA territory. Significant region of surrounding elevated T-max concerning for at-risk tissue.   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   Intracranial atherosclerosis as above. Moderate stenosis of the right paraclinoid ICA. Severe stenosis and possible short segment occlusion of the P1 segment left PCA.   Atherosclerosis at the right carotid bifurcation resulting in approximately 50% stenosis.   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   Aortic Atherosclerosis (ICD10-I70.0).    MRI brain without contrast 05/13/2024: Acute infarct in the left frontal operculum and left insular cortex. Additional scattered areas of acute infarct involving the cortex and subcortical white matter within the left frontoparietal lobes. Chronic left ICA occlusion noted. Abnormal susceptibility along the distal M1/proximal M2 segments of the left MCA suggestive of thrombus. Remote infarct in the left parietal lobe. Chronic microvascular ischemic changes.       ASSESSMENT AND PLAN: 53 year old male with acute onset aphasia.  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  Acute ischemic stroke Etiology: Likely hypoperfusion  Recommendations -Recommend aspirin  81 mg daily and Brilinta  90 mg twice daily for 30 days followed by aspirin  35 mg daily - LDL 16.  Continue current management - TTE ordered and pending.  If negative recommend cardiac monitor to look for paroxysmal A-fib -  Requested interventional radiology to review his CT 1 more time to see if there is anything else we can offer.  However due to previous failed attempt, this is less likely. - Goal blood pressure systolic 120s to 859d.  Please avoid hypotension -  Follow-up with [facility] in 2 to 3 months - Stroke education - PT/OT/speech eval - Discussed plan with Dr. Bryn via secure chat    I personally spent a total of 36 minutes in the care of the patient today including getting/reviewing separately obtained history, performing a medically appropriate exam/evaluation, counseling and educating, placing orders, referring and communicating with other health care professionals, documenting clinical information in the EHR, independently interpreting results, and coordinating care.          Arlin Krebs Epilepsy [facility] For questions after 5pm please refer to AMION to reach the Neurologist on call

## 2024-05-13 NOTE — TOC CM/SW Note (Signed)
 Transition of Care [facility]) - Inpatient Brief Assessment   Patient Details  Name: Brandon [name] MRN: [id number] Date of Birth: Sep 03, 1971  Transition of Care [facility]) CM/SW Contact:    Noreen KATHEE Pinal, LCSWA Phone Number: 05/13/2024, 1:12 PM   Clinical Narrative:  Transition of Care Department [facility]) has reviewed patient and no TOC needs have been identified at this time. We will continue to monitor patient advancement through interdisciplinary progression rounds. If new patient transition needs arise, please place a TOC consult.  Transition of Care Asessment: Insurance and Status: Insurance coverage has been reviewed Patient has primary care physician: Yes Home environment has been reviewed: Single Family Home Prior level of function:: Independent Prior/Current Home Services: No current home services Social Drivers of Health Review: SDOH reviewed no interventions necessary Readmission risk has been reviewed: Yes Transition of care needs: no transition of care needs at this time

## 2024-05-13 NOTE — Progress Notes (Addendum)
 Nurse at bedside,patient alert and oriented to person,place,and situation,confused to time.Patient  does have right-sided facial drooping noted,and Expressive aphasia noted.No c/o pain or discomfort noted.Plan of care on going.

## 2024-05-13 NOTE — Evaluation (Signed)
 Occupational Therapy Evaluation Patient Details Name: Brandon [name] MRN: [id number] DOB: 15-Jun-1971 Today's Date: 05/13/2024   History of Present Illness   Brandon [name] is a 53 y.o. male with medical history significant for BPH, coronary artery disease, OSA, depression, hypertension.  Patient presented to the ED with reports of slurred speech and memory impairment.  Per notes - last night patient was driving, and complained to stepdaughter that he had vision changes and is not able to find his way back home.  Daughter tells me now that patient's memory was okay yesterday, it was raining heavily and that was why patient did not know where he was.  Patient symptoms suddenly started at 4.30pm today-per his roommate.  Prior to this he had been okay all day.  Symptoms have improved some since onset today.     Patient has a history of stroke in 2021, for symptoms this time are most severe.  He has been compliant with aspirin .  He still smokes cigarettes.     Clinical Impressions Pt agreeable to OT and PT co-evaluation. Pt near baseline for functional ADL's but does present with mild fine motor and gross motor deficit to R UE. Primary deficit appears to be expressive difficulties at this time. Pt was able to ambulate and complete ADL simulation tasks without assist today. Pt is not recommended for further acute OT services and will be discharged to care of nursing staff for remaining length of stay.              Functional Status Assessment   Patient has not had a recent decline in their functional status     Equipment Recommendations   None recommended by OT             Precautions/Restrictions   Precautions Precautions: Fall Recall of Precautions/Restrictions: Intact Restrictions Weight Bearing Restrictions Per Provider Order: No     Mobility Bed Mobility Overal bed mobility: Independent             General bed mobility comments: w/o railing     Transfers Overall transfer level: Independent Equipment used: None               General transfer comment: Ambulated to commode and in hall. No AD.      Balance                                           ADL either performed or assessed with clinical judgement   ADL Overall ADL's : Independent                                             Vision Baseline Vision/History: 1 Wears glasses Ability to See in Adequate Light: 1 Impaired Patient Visual Report: No change from baseline Vision Assessment?: No apparent visual deficits     Perception Perception: Not tested       Praxis Praxis: Not tested       Pertinent Vitals/Pain Pain Assessment Pain Assessment: No/denies pain     Extremity/Trunk Assessment Upper Extremity Assessment Upper Extremity Assessment: Right hand dominant;RUE deficits/detail RUE Deficits / Details: WFL strength. Mild fine motor difficulty via sequential finger touching, and difficulty noted with finger to nose test. RUE Sensation: decreased light touch RUE Coordination: decreased fine  motor;decreased gross motor   Lower Extremity Assessment Lower Extremity Assessment: Defer to PT evaluation   Cervical / Trunk Assessment Cervical / Trunk Assessment: Normal   Communication Communication Communication: Impaired Factors Affecting Communication: Difficulty expressing self;Reduced clarity of speech   Cognition Arousal: Alert Behavior During Therapy: WFL for tasks assessed/performed Cognition: No apparent impairments                               Following commands: Intact       Cueing  General Comments   Cueing Techniques: Verbal cues;Visual cues                 Home Living Family/patient expects to be discharged to:: Private residence Living Arrangements: Non-relatives/Friends;Other relatives Available Help at Discharge: Family;Available PRN/intermittently Type of Home:  House Home Access: Stairs to enter [employer] of Steps: 2 Entrance Stairs-Rails: Can reach both Home Layout: One level     Bathroom Shower/Tub: Tub/shower unit;Walk-in shower   Bathroom Toilet: Standard Bathroom Accessibility: No   Home Equipment: Pharmacist, hospital (2 wheels);Cane - single point;Wheelchair - manual;BSC/3in1   Additional Comments: Has a roommate. Reports family are available prn, specifically mentions son-in-law. (as per PT)      Prior Functioning/Environment Prior Level of Function : Independent/Modified Independent;Driving             Mobility Comments: Community ambulator, no AD. Still driving ADLs Comments: Independent                            Co-evaluation PT/OT/SLP Co-Evaluation/Treatment: Yes Reason for Co-Treatment: To address functional/ADL transfers PT goals addressed during session: Mobility/safety with mobility OT goals addressed during session: ADL's and self-care                       End of Session    Activity Tolerance: Patient tolerated treatment well Patient left: with call bell/phone within reach;in bed  OT Visit Diagnosis: Muscle weakness (generalized) (M62.81);Other symptoms and signs involving the nervous system (R29.898)                Time: 9063-9049 OT Time Calculation (min): 14 min Charges:  OT General Charges $OT Visit: 1 Visit OT Evaluation $OT Eval Low Complexity: 1 Low  Taelar Gronewold OT, MOT  Jayson Person 05/13/2024, 12:31 PM

## 2024-05-13 NOTE — Progress Notes (Signed)
  Echocardiogram 2D Echocardiogram has been performed.  Brandon [name] 05/13/2024, 8:32 AM

## 2024-05-13 NOTE — Plan of Care (Signed)
  Problem: Education: Goal: Knowledge of disease or condition will improve Outcome: Progressing   Problem: Ischemic Stroke/TIA Tissue Perfusion: Goal: Complications of ischemic stroke/TIA will be minimized Outcome: Progressing   Problem: Education: Goal: Knowledge of General Education information will improve Description: Including pain rating scale, medication(s)/side effects and non-pharmacologic comfort measures Outcome: Progressing   Problem: Pain Managment: Goal: General experience of comfort will improve and/or be controlled Outcome: Progressing   Problem: Safety: Goal: Ability to remain free from injury will improve Outcome: Progressing

## 2024-05-14 ENCOUNTER — Telehealth: Payer: Self-pay

## 2024-05-14 NOTE — Transitions of Care (Post Inpatient/ED Visit) (Signed)
   05/14/2024  Name: Brandon [name]. MRN: [id number] DOB: 04-14-1971  Today's TOC FU Call Status: Today's TOC FU Call Status:: Successful TOC FU Call Completed TOC FU Call Complete Date: 05/14/24 Patient's Name and Date of Birth confirmed.  Transition Care Management Follow-up Telephone Call Date of Discharge: 05/13/24 [facility] Type of Discharge: Inpatient Admission Primary Inpatient Discharge Diagnosis:: cerebral infarction How have you been since you were released from the hospital?: Better Any questions or concerns?: No  Items Reviewed: Did you receive and understand the discharge instructions provided?: Yes Medications obtained,verified, and reconciled?: Yes (Medications Reviewed) Any new allergies since your discharge?: No Dietary orders reviewed?: Yes Do you have support at home?: Yes People in Home [RPT]: friend(s)  Medications Reviewed Today: Medications Reviewed Today     Reviewed by Emmitt Pan, LPN (Licensed Practical Nurse) on 05/14/24 at 1031  Med List Status: <None>   Medication Order Taking? Sig Documenting Provider Last Dose Status Informant  aspirin  EC 81 MG tablet [id number] Yes Take 1 tablet (81 mg total) by mouth daily. Swallow whole.  Patient taking differently: Take 81 mg by mouth daily. Swallow whole. Take twice daily for 30 days   Court Dorn PARAS, MD  Active   atorvastatin  (LIPITOR ) 80 MG tablet [id number] Yes Take 1 tablet by mouth once daily Berry, Jonathan J, MD  Active   citalopram  (CELEXA ) 20 MG tablet [id number] Yes Take 1 tablet (20 mg total) by mouth daily. Joyce Norleen BROCKS, MD  Active            Med Note DRENA, CHUCK KANDICE Kitchens May 12, 2024  5:52 PM)    Evolocumab  (REPATHA  SURECLICK) 140 MG/ML SOAJ [id number] Yes INJECT 1 PEN  SUBCUTANEOUSLY EVERY 14 DAYS Court Dorn PARAS, MD  Active   ezetimibe  (ZETIA ) 10 MG tablet [id number] Yes Take 1 tablet by mouth once daily Lalonde, John C, MD  Active   metoprolol   succinate (TOPROL -XL) 50 MG 24 hr tablet [id number] Yes Take 50 mg by mouth daily. [provider]  Active   ticagrelor  (BRILINTA ) 90 MG TABS tablet [id number] Yes Take 1 tablet (90 mg total) by mouth 2 (two) times daily. Bryn Bernardino NOVAK, MD  Active             Home Care and Equipment/Supplies: Were Home Health Services Ordered?: Yes Name of Home Health Agency:: unknown Has Agency set up a time to come to your home?: No Any new equipment or medical supplies ordered?: NA  Functional Questionnaire: Do you need assistance with bathing/showering or dressing?: No Do you need assistance with meal preparation?: No Do you need assistance with eating?: No Do you have difficulty maintaining continence: No Do you need assistance with getting out of bed/getting out of a chair/moving?: No Do you have difficulty managing or taking your medications?: No  Follow up appointments reviewed: PCP Follow-up appointment confirmed?: Yes Date of PCP follow-up appointment?: 05/15/24 Follow-up Provider: [facility] Follow-up appointment confirmed?: No Reason Specialist Follow-Up Not Confirmed: Patient has Specialist Provider Number and will Call for Appointment Do you need transportation to your follow-up appointment?: No Do you understand care options if your condition(s) worsen?: Yes-patient verbalized understanding    SIGNATURE Pan Emmitt, LPN [facility] Nurse Health Advisor Direct Dial [phone]

## 2024-05-15 ENCOUNTER — Other Ambulatory Visit: Payer: Self-pay | Admitting: Cardiovascular Disease

## 2024-05-15 ENCOUNTER — Ambulatory Visit (INDEPENDENT_AMBULATORY_CARE_PROVIDER_SITE_OTHER): Admitting: Family Medicine

## 2024-05-15 ENCOUNTER — Encounter: Payer: Self-pay | Admitting: Family Medicine

## 2024-05-15 VITALS — BP 138/92 | HR 70 | Wt 277.2 lb

## 2024-05-15 DIAGNOSIS — R4701 Aphasia: Secondary | ICD-10-CM

## 2024-05-15 DIAGNOSIS — I2510 Atherosclerotic heart disease of native coronary artery without angina pectoris: Secondary | ICD-10-CM

## 2024-05-15 DIAGNOSIS — I6359 Cerebral infarction due to unspecified occlusion or stenosis of other cerebral artery: Secondary | ICD-10-CM

## 2024-05-15 DIAGNOSIS — I6522 Occlusion and stenosis of left carotid artery: Secondary | ICD-10-CM | POA: Diagnosis not present

## 2024-05-15 DIAGNOSIS — E782 Mixed hyperlipidemia: Secondary | ICD-10-CM

## 2024-05-15 DIAGNOSIS — I10 Essential (primary) hypertension: Secondary | ICD-10-CM | POA: Diagnosis not present

## 2024-05-15 NOTE — Telephone Encounter (Signed)
-----   Message from Laymon CHRISTELLA Qua sent at 05/13/2024  4:15 PM EDT ----- Regarding: 30-day monitor This patient needs a 30-day monitor for CVA per Dr. Bryn. His primary Cardiologist is Dr. Court.   Thanks, [place]

## 2024-05-15 NOTE — Progress Notes (Signed)
   Subjective:    Patient ID: Brandon Sherwood Acie Mickey., male    DOB: 10-Dec-1970, 53 y.o.   MRN: [id number]  HPI He is here for follow-up on CVA.  He did have left MCA stenosis.  He also has a previous history of left ICA obstruction.  He now has expressive aphasia.  He does need speech therapy for this.  He has an appointment with [facility] neurology next week.  Presently he is taking Brilinta  twice per day.  He is also on metoprolol  50 mg.  He has a sleep study scheduled for August 16 with follow-up pulmonary September 19.  Presently he is having no weakness, numbness or tingling but definitely has expressive aphasia.   Review of Systems     Objective:    Physical Exam Alert and in no distress.  Blood pressure and pulse are recorded.  Expressive aphasia is definitely noted.  The hospital record including labs and scans was accomplished.     Assessment & Plan:  Cerebrovascular accident (CVA) due to occlusion of other cerebral artery (HCC)  Expressive aphasia - Plan: Ambulatory referral to Speech Therapy  Primary hypertension  Internal carotid artery occlusion, left  Coronary artery disease involving native coronary artery of native heart without angina pectoris Referral will be made for speech therapy.  He will follow-up with neurology as scheduled as well as having the sleep study done.  Continue on Brilinta .  I increased his beta-blocker to 100 mg and he is to return here in 1 month.

## 2024-05-18 ENCOUNTER — Other Ambulatory Visit: Payer: Self-pay | Admitting: Family Medicine

## 2024-05-18 DIAGNOSIS — F339 Major depressive disorder, recurrent, unspecified: Secondary | ICD-10-CM

## 2024-05-20 DIAGNOSIS — G459 Transient cerebral ischemic attack, unspecified: Secondary | ICD-10-CM | POA: Diagnosis not present

## 2024-05-20 NOTE — Progress Notes (Unsigned)
 [facility] [address] [place]. [place] [address] [phone]       HOSPITAL FOLLOW UP NOTE  [name]. Date of Birth:  [birth year] Medical Record Number:  [id number]   Reason for Referral:  hospital stroke follow up    SUBJECTIVE:   CHIEF COMPLAINT:  No chief complaint on file.   HPI:   Brandon [name]. Is a 53 year old male with PMH of heavy tobacco use, sleep apnea, CAD s/p stent, HTN, HLD and history of stroke/TIA who presented to [facility] on 05/12/2024 with expressive aphasia.  He was evaluated by Dr. Jerri via telemetry neurology, MRI brain showed acute left frontal operculum and left insular cortex infarcts as well as additional scattered infarcts involving the cortex and subcortical white matter within the left frontal parietal lobes.  Noted chronic left ICA occlusion and distal M1/proximal M2 abnormality suggestive of left MCA thrombus.  He was not a candidate for IR given left ICA occlusion with unsuccessful attempt 4 years prior.  EF 55 to 60%.  Recommended aspirin  and Brilinta  for 30 days followed by aspirin  325 mg daily.  A1c 5.3.  LDL 16, recommended continuation of Repatha  and Zetia  for HLD management.  Tobacco cessation counseling provided.   Today, 05/21/2024, patient is being seen for initial hospital follow-up.  He was previously seen by Dr. Rosemarie back in March to discuss his ongoing needs of Plavix  as requested by PCP with history of stroke in 2021.     PERTINENT IMAGING      ROS:   14 system review of systems performed and negative with exception of ***  PMH:  Past Medical History:  Diagnosis Date   Arteriosclerotic cardiovascular disease (ASCVD) 2006, 2012   2006-acute IMI treated with urgent RCA stent; 2007-Cutting Balloon for in-stent restenosis; 02/2010-presented with ACS and minimal troponin elevation:70% LAD, 80% distal circumflex, 80% proximal ramus branch vessel,in-stent restenosis of 70% in the  RCA; BMS for proximal critical RCA stenosis, restenosis Nov 2012   Carotid artery occlusion    Difficult airway for intubation    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, due to reduced neck mobility   Heart attack (HCC)    History of noncompliance with medical treatment    Due to financial considerations   Hyperlipidemia    Hypertension    Stroke (HCC)    Tobacco abuse    40 pack years    PSH:  Past Surgical History:  Procedure Laterality Date   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   Procedure: BYPASS GRAFT CAROTID-SUBCLAVIAN USING HEMASHIELD GOLD GRAFT;  Surgeon: Harvey Carlin BRAVO, MD;  Location[facility] OR;  Service: Vascular;  Laterality: Left;   COLONOSCOPY N/A 02/18/2024   Procedure: COLONOSCOPY;  Surgeon: San Sandor GAILS, DO;  Location: [facility];  Service: Gastroenterology;  Laterality: N/A;   COLONOSCOPY WITH PROPOFOL  N/A 09/21/2020   Procedure: COLONOSCOPY WITH PROPOFOL ;  Surgeon: San Sandor GAILS, DO;  Location: [facility];  Service: Gastroenterology;  Laterality: N/A;   CORONARY ANGIOPLASTY WITH STENT PLACEMENT     HEMOSTASIS CLIP PLACEMENT  09/21/2020   Procedure: HEMOSTASIS CLIP PLACEMENT;  Surgeon: San Sandor GAILS, DO;  Location: [facility];  Service: Gastroenterology;;   IR ANGIO EXTERNAL CAROTID SEL EXT CAROTID BILAT MOD SED  12/15/2019   IR ANGIO VERTEBRAL SEL VERTEBRAL UNI R MOD SED  12/15/2019   IR ANGIOGRAM EXTREMITY LEFT  12/15/2019   IR ANGIOGRAM EXTREMITY LEFT  12/15/2019   IR CT HEAD  LTD  12/15/2019   IR PERCUTANEOUS ART THROMBECTOMY/INFUSION INTRACRANIAL INC DIAG ANGIO  12/15/2019   POLYPECTOMY  09/21/2020   Procedure: POLYPECTOMY;  Surgeon: San Sandor GAILS, DO;  Location: [facility];  Service: Gastroenterology;;   POLYPECTOMY  02/18/2024   Procedure: POLYPECTOMY, INTESTINE;  Surgeon: San Sandor GAILS, DO;  Location: [facility];  Service: Gastroenterology;;   RADIOLOGY WITH ANESTHESIA N/A 12/15/2019   Procedure: IR WITH ANESTHESIA;   Surgeon: Dolphus Carrion, MD;  Location: [facility] OR;  Service: Radiology;  Laterality: N/A;    Social History:  Social History   Socioeconomic History   Marital status: Widowed    Spouse name: Not on file   Number of children: 2   Years of education: Not on file   Highest education level: Not on file  Occupational History   Occupation: Disabled    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 AB, CMA 03-04-24  Vaping Use   Vaping status: Never Used  Substance and Sexual Activity   Alcohol use: No    Comment:  Alcoholism- quit 2002   Drug use: No   Sexual activity: Not Currently  Other Topics Concern   Not on file  Social History Narrative            Social Drivers of Health   Financial Resource Strain: Low Risk  (09/24/2023)   Overall Financial Resource Strain (CARDIA)    Difficulty of Paying Living Expenses: Not very hard  Food Insecurity: No Food Insecurity (05/12/2024)   Hunger Vital Sign    Worried About Running Out of Food in the Last Year: Never true    Ran Out of Food in the Last Year: Never true  Transportation Needs: No Transportation Needs (05/12/2024)   PRAPARE - Administrator, Civil Service (Medical): No    Lack of Transportation (Non-Medical): No  Physical Activity: Insufficiently Active (09/24/2023)   Exercise Vital Sign    Days of Exercise per Week: 3 days    Minutes of Exercise per Session: 30 min  Stress: No Stress Concern Present (09/24/2023)   [employer] of Occupational Health - Occupational Stress Questionnaire    Feeling of Stress : Not at all  Social Connections: Moderately Isolated (09/24/2023)   Social Connection and Isolation Panel    Frequency of Communication with  Friends and Family: More than three times a week    Frequency of Social Gatherings with Friends and Family: Once a week    Attends Religious Services: More than 4 times per year    Active Member of [employer] or Organizations: No    Attends Banker Meetings: Never    Marital Status: Widowed  Intimate Partner Violence: Not At Risk (05/12/2024)   Humiliation, Afraid, Rape, and Kick questionnaire    Fear of Current or Ex-Partner: No    Emotionally Abused: No    Physically Abused: No    Sexually Abused: No    Family History:  Family History  Problem Relation Age of Onset   Depression Mother 54       Suicide   Heart disease Father 67       Deceased from massive heart-attack   Hyperlipidemia Father  Hypertension Father    COPD Maternal Grandfather    Cancer Paternal Grandmother        Male Cancer   Heart disease Paternal Grandmother    Colon cancer Neg Hx    Pancreatic cancer Neg Hx    Esophageal cancer Neg Hx     Medications:   Current Outpatient Medications on File Prior to Visit  Medication Sig Dispense Refill   aspirin  EC 81 MG tablet Take 1 tablet (81 mg total) by mouth daily. Swallow whole. (Patient taking differently: Take 81 mg by mouth daily. Swallow whole. Take twice daily for 30 days) 90 tablet 3   atorvastatin  (LIPITOR ) 80 MG tablet Take 1 tablet by mouth once daily 90 tablet 3   citalopram  (CELEXA ) 20 MG tablet Take 1 tablet by mouth once daily 30 tablet 0   Evolocumab  (REPATHA  SURECLICK) 140 MG/ML SOAJ INJECT 1 PEN SUBCUTANEOUSLY EVERY 14 DAYS 6 mL 3   ezetimibe  (ZETIA ) 10 MG tablet Take 1 tablet by mouth once daily 90 tablet 0   metoprolol  succinate (TOPROL -XL) 50 MG 24 hr tablet Take 50 mg by mouth daily.     ticagrelor  (BRILINTA ) 90 MG TABS tablet Take 1 tablet (90 mg total) by mouth 2 (two) times daily. 60 tablet 0   No current facility-administered medications on file prior to visit.    Allergies:  No Known Allergies    OBJECTIVE:  Physical  Exam  There were no vitals filed for this visit. There is no height or weight on file to calculate BMI. No results found.   General: well developed, well nourished, seated, in no evident distress Head: head normocephalic and atraumatic.   Neck: supple with no carotid or supraclavicular bruits Cardiovascular: regular rate and rhythm, no murmurs Musculoskeletal: no deformity Skin:  no rash/petichiae Vascular:  Normal pulses all extremities   Neurologic Exam Mental Status: Awake and fully alert. Oriented to place and time. Recent and remote memory intact. Attention span, concentration and fund of knowledge appropriate. Mood and affect appropriate.  Cranial Nerves: Fundoscopic exam reveals sharp disc margins. Pupils equal, briskly reactive to light. Extraocular movements full without nystagmus. Visual fields full to confrontation. Hearing intact. Facial sensation intact. Face, tongue, palate moves normally and symmetrically.  Motor: Normal bulk and tone. Normal strength in all tested extremity muscles Sensory.: intact to touch , pinprick , position and vibratory sensation.  Coordination: Rapid alternating movements normal in all extremities. Finger-to-nose and heel-to-shin performed accurately bilaterally. Gait and Station: Arises from chair without difficulty. Stance is normal. Gait demonstrates normal stride length and balance with ***. Tandem walk and heel toe ***.  Reflexes: 1+ and symmetric. Toes downgoing.     NIHSS  *** Modified Rankin  ***      ASSESSMENT: Brandon [name]. is a 53 y.o. 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 Vascular risk factors include HTN, HLD, left ICA occlusion, history of stroke in 2021, and hx of L CCA to subclavian bypass 2021.      PLAN:  L MCA stroke : Hx of stroke:   Residual deficit: ***.  Recommend zio monitor to assess for A fib in setting of recent stroke Continue aspirin  81mg  daily and Brilinta  for additional 3 weeks then aspirin  325mg  daily alone and continue Repatha  for secondary stroke prevention managed/prescribed by PCP.   Discussed secondary  stroke prevention measures and importance of close PCP follow up for aggressive stroke risk factor management including BP goal<130/90, HLD with LDL goal<70 and DM with A1c.<7 .  Stroke labs 04/2024: LDL 16, A1c 5.3 I have gone over the pathophysiology of stroke, warning signs and symptoms, risk factors and their management in some detail with instructions to go to the closest emergency room for symptoms of concern. Tobacco abuse: Discussed importance of complete tobacco cessation as continued use greatly increases risk of cerebrovascular and cardiovascular disease    Follow up in *** or call earlier if needed   CC:  [facility] provider: Dr. Rosemarie PCP: Joyce Norleen BROCKS, MD    I personally spent a total of *** minutes in the care of the patient today including {Time Based Coding:[id number]}.    Harlene Bogaert, AGNP-BC  [facility] [address] [place], [place] [address]  Phone [phone] Fax [phone] Note: This document was prepared with digital dictation and possible smart phrase technology. Any transcriptional errors that result from this process are unintentional.

## 2024-05-21 ENCOUNTER — Ambulatory Visit: Attending: Cardiovascular Disease

## 2024-05-21 ENCOUNTER — Ambulatory Visit (INDEPENDENT_AMBULATORY_CARE_PROVIDER_SITE_OTHER): Admitting: Adult Health

## 2024-05-21 ENCOUNTER — Encounter: Payer: Self-pay | Admitting: Adult Health

## 2024-05-21 VITALS — BP 154/96 | HR 58 | Ht 69.0 in | Wt 280.8 lb

## 2024-05-21 DIAGNOSIS — G8191 Hemiplegia, unspecified affecting right dominant side: Secondary | ICD-10-CM

## 2024-05-21 DIAGNOSIS — I69398 Other sequelae of cerebral infarction: Secondary | ICD-10-CM | POA: Diagnosis not present

## 2024-05-21 DIAGNOSIS — G4733 Obstructive sleep apnea (adult) (pediatric): Secondary | ICD-10-CM

## 2024-05-21 DIAGNOSIS — R269 Unspecified abnormalities of gait and mobility: Secondary | ICD-10-CM | POA: Diagnosis not present

## 2024-05-21 DIAGNOSIS — I63312 Cerebral infarction due to thrombosis of left middle cerebral artery: Secondary | ICD-10-CM

## 2024-05-21 DIAGNOSIS — Z720 Tobacco use: Secondary | ICD-10-CM

## 2024-05-21 DIAGNOSIS — Z8673 Personal history of transient ischemic attack (TIA), and cerebral infarction without residual deficits: Secondary | ICD-10-CM

## 2024-05-21 DIAGNOSIS — I639 Cerebral infarction, unspecified: Secondary | ICD-10-CM

## 2024-05-21 NOTE — Patient Instructions (Signed)
 Orders placed to start physical and occupational therapy in addition to speech therapy. If you do not hear from them by the end of the week, please call them to schedule   Recommend holding off on driving for now to allow for more recovery time. Strokes can affect your reaction time and multi tasking. If you continue to do well over the next 1-2 months and no concern of driving by your speech or physical therapist, you can gradually return back to driving as instructed below. If there are any concerns of your driving, would recommend allowing additional 1-2 months for further recovery or consider driving evaluation.   RETURN TO DRIVING PLAN:   WITH THE SUPERVISION OF A LICENSED DRIVER, PLEASE DRIVE IN AN EMPTY PARKING LOT FOR AT LEAST 2-3 TRIALS TO TEST REACTION TIME, VISION, USE OF EQUIPMENT IN CAR, ETC.   IF SUCCESSFUL WITH THE PARKING LOT DRIVING, PROCEED TO SUPERVISED DRIVING TRIALS IN YOUR NEIGHBORHOOD STREETS AT LOW TRAFFIC TIMES TO TEST OBSERVATION TO TRAFFIC SIGNALS, REACTION TIME, ETC. PLEASE ATTEMPT AT LEAST 2-3 TRIALS IN YOUR NEIGHBORHOOD.   IF NEIGHBORHOOD DRIVING IS SUCCESSFUL, YOU MAY PROCEED TO DRIVING IN BUSIER AREAS IN YOUR COMMUNITY WITH SUPERVISION OF A LICENSED DRIVER. PLEASE ATTEMPT AT LEAST 4-5 TRIALS.   Continue aspirin  81 mg daily and Brilinta  (ticagrelor ) 90 mg bid for additional 3 weeks then aspirin  325mg  daily alone and continue Repatha  and Zetia  for secondary stroke prevention  Highly encourage complete tobacco cessation as continued use greatly increases risk of additional strokes  Complete cardiac monitor to evaluate for possible irregular heart condition contributing to stroke   Continue to follow up with PCP regarding blood pressure and cholesterol management  Maintain strict control of hypertension with blood pressure goal below 130/90 and cholesterol with LDL cholesterol (bad cholesterol) goal below 70 mg/dL.   Signs of a Stroke? Follow the BEFAST method:   Balance Watch for a sudden loss of balance, trouble with coordination or vertigo Eyes Is there a sudden loss of vision in one or both eyes? Or double vision?  Face: Ask the person to smile. Does one side of the face droop or is it numb?  Arms: Ask the person to raise both arms. Does one arm drift downward? Is there weakness or numbness of a leg? Speech: Ask the person to repeat a simple phrase. Does the speech sound slurred/strange? Is the person confused ? Time: If you observe any of these signs, call 911.        Thank you for coming to see us  at [facility]. I hope we have been able to provide you high quality care today.  You may receive a patient satisfaction survey over the next few weeks. We would appreciate your feedback and comments so that we may continue to improve ourselves and the health of our patients.

## 2024-05-21 NOTE — Progress Notes (Signed)
 I agree with the above plan

## 2024-05-26 ENCOUNTER — Ambulatory Visit (HOSPITAL_COMMUNITY): Attending: Adult Health

## 2024-05-26 DIAGNOSIS — G8191 Hemiplegia, unspecified affecting right dominant side: Secondary | ICD-10-CM | POA: Diagnosis not present

## 2024-05-26 DIAGNOSIS — R269 Unspecified abnormalities of gait and mobility: Secondary | ICD-10-CM | POA: Diagnosis not present

## 2024-05-26 DIAGNOSIS — R2689 Other abnormalities of gait and mobility: Secondary | ICD-10-CM | POA: Insufficient documentation

## 2024-05-26 DIAGNOSIS — M6281 Muscle weakness (generalized): Secondary | ICD-10-CM | POA: Insufficient documentation

## 2024-05-26 DIAGNOSIS — I69398 Other sequelae of cerebral infarction: Secondary | ICD-10-CM | POA: Insufficient documentation

## 2024-05-26 DIAGNOSIS — R262 Difficulty in walking, not elsewhere classified: Secondary | ICD-10-CM | POA: Diagnosis not present

## 2024-05-26 NOTE — Therapy (Signed)
 OUTPATIENT PHYSICAL THERAPY NEURO EVALUATION   Patient Name: Brandon [name]. MRN: [id number] DOB:03/04/1971, 53 y.o., male 54 Date: 05/26/2024   PCP: Joyce Norleen BROCKS, MD REFERRING PROVIDER: Whitfield Raisin, NP  END OF SESSION:  PT End of Session - 05/26/24 1142     Visit Number 1    Number of Visits 1    Authorization Type [employer]    Authorization Time Period --    PT Start Time 1145    PT Stop Time 1205    PT Time Calculation (min) 20 min    Activity Tolerance Patient tolerated treatment well    Behavior During Therapy WFL for tasks assessed/performed          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, due to reduced neck mobility   Heart attack (HCC)    History of noncompliance with medical treatment    Due to financial considerations   Hyperlipidemia    Hypertension    Stroke [facility])    Tobacco abuse    40 pack years   Past Surgical History:  Procedure Laterality Date   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   Procedure: BYPASS GRAFT CAROTID-SUBCLAVIAN USING HEMASHIELD GOLD GRAFT;  Surgeon: Harvey Carlin BRAVO, MD;  Location: [facility] OR;  Service: Vascular;  Laterality: Left;   COLONOSCOPY N/A 02/18/2024   Procedure: COLONOSCOPY;  Surgeon: San Sandor GAILS, DO;  Location: [facility];  Service: Gastroenterology;  Laterality: N/A;   COLONOSCOPY WITH PROPOFOL  N/A 09/21/2020   Procedure: COLONOSCOPY WITH PROPOFOL ;  Surgeon: San Sandor GAILS, DO;  Location: [facility];   Service: Gastroenterology;  Laterality: N/A;   CORONARY ANGIOPLASTY WITH STENT PLACEMENT     HEMOSTASIS CLIP PLACEMENT  09/21/2020   Procedure: HEMOSTASIS CLIP PLACEMENT;  Surgeon: San Sandor GAILS, DO;  Location: [facility];  Service: Gastroenterology;;   IR ANGIO EXTERNAL CAROTID SEL EXT CAROTID BILAT MOD SED  12/15/2019   IR ANGIO VERTEBRAL SEL VERTEBRAL UNI R MOD SED  12/15/2019   IR ANGIOGRAM EXTREMITY LEFT  12/15/2019   IR ANGIOGRAM EXTREMITY LEFT  12/15/2019   IR CT HEAD LTD  12/15/2019   IR PERCUTANEOUS ART THROMBECTOMY/INFUSION INTRACRANIAL INC DIAG ANGIO  12/15/2019   POLYPECTOMY  09/21/2020   Procedure: POLYPECTOMY;  Surgeon: San Sandor GAILS, DO;  Location: [facility];  Service: Gastroenterology;;   POLYPECTOMY  02/18/2024   Procedure: POLYPECTOMY, INTESTINE;  Surgeon: San Sandor GAILS, DO;  Location: [facility];  Service: Gastroenterology;;   RADIOLOGY WITH ANESTHESIA N/A 12/15/2019   Procedure: IR WITH ANESTHESIA;  Surgeon: Dolphus Carrion, MD;  Location: [facility] OR;  Service: Radiology;  Laterality: N/A;   Patient Active Problem List   Diagnosis Date Noted   Expressive aphasia 05/12/2024   Chronic rhinitis 04/28/2024   Deviated nasal septum 04/28/2024   Hypertrophy of nasal turbinates 04/28/2024   Conductive hearing loss in left ear 04/28/2024   Other specified disorders of eustachian tube, left ear 04/28/2024   Major depressive disorder, recurrent episode, moderate (HCC) 03/18/2024   Tubular adenoma 02/19/2024   History of colonic polyps 10/25/2023  Coronary artery disease 07/30/2023   History of COVID-19 06/08/2021   Grade II internal hemorrhoids    Adenomatous polyp of sigmoid colon    Adenomatous polyp of descending colon    Rectal polyp    Difficult airway for intubation 08/19/2020   Restrictive lung disease 04/30/2020   OSA (obstructive sleep apnea) 04/30/2020   History of MI (myocardial infarction) 02/25/2020   Atherosclerosis of aorta (HCC) 02/25/2020    Subclavian artery stenosis (HCC) 02/09/2020   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   BPH (benign prostatic hyperplasia) 12/17/2019   Internal carotid artery occlusion, left 12/15/2019   History of kidney stones 03/21/2019   Genital warts 01/23/2019   Antiplatelet or antithrombotic long-term use 01/23/2019   History of renal calculi 01/22/2019   Bilateral hearing loss 12/17/2017   ETD (Eustachian tube dysfunction), bilateral 12/17/2017   Depression, major, in remission (HCC) 12/29/2012   Sleep apnea 12/29/2012   Arteriosclerotic cardiovascular disease (ASCVD)    Tobacco abuse    Hyperlipidemia 05/30/2011   Hypertension 05/30/2011   Morbid obesity (HCC) 05/30/2011    ONSET DATE: 2 weeks ago  REFERRING DIAG: I69.398,R26.9 (ICD-10-CM) - Gait disturbance, post-stroke G81.91 (ICD-10-CM) - Right hemiparesis (HCC)  THERAPY DIAG:  Difficulty in walking, not elsewhere classified  Muscle weakness (generalized)  Other abnormalities of gait and mobility  Rationale for Evaluation and Treatment: Rehabilitation  SUBJECTIVE:                                                                                                                                                                                             SUBJECTIVE STATEMENT: CVA been about 2 weeks ago; walking pretty good but still having trouble with speech and his right hand Pt accompanied by: self  PERTINENT HISTORY: previous CVA 2021  PAIN:  Are you having pain? No  PRECAUTIONS: None  RED FLAGS: None   WEIGHT BEARING RESTRICTIONS: No  FALLS: Has patient fallen in last 6 months? No  PLOF: Independent with basic ADLs  PATIENT GOALS: get my talking back right  OBJECTIVE:  Note: Objective measures were completed at Evaluation unless otherwise noted.  DIAGNOSTIC FINDINGS:   COGNITION: Overall cognitive status: Within functional limits for tasks  assessed   SENSATION: WFL  COORDINATION: wfl  EDEMA:  None noted  MUSCLE TONE:     POSTURE: No Significant postural limitations  LOWER EXTREMITY ROM:   grossly wfl  Active  Right Eval Left Eval  Hip flexion    Hip extension    Hip abduction    Hip adduction    Hip internal rotation  Hip external rotation    Knee flexion    Knee extension    Ankle dorsiflexion    Ankle plantarflexion    Ankle inversion    Ankle eversion     (Blank rows = not tested)  LOWER EXTREMITY MMT:    MMT Right Eval Left Eval  Hip flexion    Hip extension    Hip abduction    Hip adduction    Hip internal rotation    Hip external rotation    Knee flexion    Knee extension    Ankle dorsiflexion    Ankle plantarflexion    Ankle inversion    Ankle eversion    (Blank rows = not tested)   TRANSFERS: Sit to stand: Complete Independence  Assistive device utilized: None     Stand to sit: Complete Independence  Assistive device utilized: None      RAMP:  Not tested  CURB:  Not tested  STAIRS: Findings: Level of Assistance: Complete Independence, Stair Negotiation Technique: Alternating Pattern  with Single Rail on Right, Number of Stairs: 5, Height of Stairs: 7   , and Comments: no loss of balance; normal speed GAIT: Findings: Gait Characteristics: WFL, Distance walked: 100 ft in clinic, Level of assistance: Complete Independence, and Comments: no loss of balance or path deviation noted  FUNCTIONAL TESTS:  5 times sit to stand: 8.64 sec Timed up and go (TUG): 8.51 sec 2 minute walk test:   SLS  30 sec each   PATIENT SURVEYS:                                                                                                                                TREATMENT DATE: 05/26/24 physical therapy evaluation and HEP instruction    PATIENT EDUCATION: Education details: Patient educated on exam findings,and HEP. Person educated: Patient Education method: Explanation,  Demonstration, and Handouts Education comprehension: verbalized understanding, returned demonstration, verbal cues required, and tactile cues required HOME EXERCISE PROGRAM: Access Code: [email or web address] URL: [email or web address] Date: 05/26/2024 Prepared by: [facility] - Rehab  Exercises - Sit to Stand Without Arm Support  - 2 x daily - 7 x weekly - 2 sets - 10 reps - standing single leg balance at the counter (try to not hold on)  - 2 x daily - 7 x weekly - 1 sets - 5 reps - Tandem Stance in Corner  - 2 x daily - 7 x weekly - 1 sets - 5 reps - 30 sec hold  GOALS: Goals reviewed with patient? No  SHORT TERM GOALS: Target date: 05/26/2024  patient will be independent with initial HEP  Baseline: Goal status:met    ASSESSMENT:  CLINICAL IMPRESSION: Patient is a 53 y.o. male who was seen today for physical therapy evaluation and treatment for I69.398,R26.9 (ICD-10-CM) - Gait disturbance, post-stroke G81.91 (ICD-10-CM) - Right hemiparesis (HCC).patient functioning wfl with all testing today.  No loss of balance or path deviation noted.  One time PT visit only discharge to HEP.  Has upcoming OT and ST appointments in the next 2 weeks.   OBJECTIVE IMPAIRMENTS: impaired UE functional use and speech impairment.   ACTIVITY LIMITATIONS: self feeding  PARTICIPATION LIMITATIONS: meal prep and driving   REHAB POTENTIAL: Good  CLINICAL DECISION MAKING: Evolving/moderate complexity  EVALUATION COMPLEXITY: Moderate  PLAN:  PT FREQUENCY: 1x/week  PT DURATION: 1 week  PLANNED INTERVENTIONS: 97164- PT Re-evaluation, 97110-Therapeutic exercises, 97530- Therapeutic activity, 97112- Neuromuscular re-education, 97535- Self Care, 02859- Manual therapy, [id number]- Gait training, [phone]- Orthotic Fit/training, [phone]- Canalith repositioning, [id number]- Aquatic Therapy, 97760- Splinting, [id number]- Wound care (first 20 sq cm), 97598- Wound care (each additional 20 sq cm)Patient/Family education, Balance  training, Stair training, Taping, Dry Needling, Joint mobilization, Joint manipulation, Spinal manipulation, Spinal mobilization, Scar mobilization, and DME instructions.   PLAN FOR NEXT SESSION: discharge to independent HEP.   12:14 PM, 05/26/24 Shawndra Clute Small Margaux Engen MPT [place] [facility] [place] [phone]

## 2024-05-30 ENCOUNTER — Encounter (HOSPITAL_COMMUNITY): Admitting: Occupational Therapy

## 2024-06-05 ENCOUNTER — Encounter (HOSPITAL_COMMUNITY): Admitting: Speech Pathology

## 2024-06-09 ENCOUNTER — Ambulatory Visit: Admitting: Primary Care

## 2024-06-17 ENCOUNTER — Ambulatory Visit: Admitting: Family Medicine

## 2024-06-17 ENCOUNTER — Ambulatory Visit (HOSPITAL_BASED_OUTPATIENT_CLINIC_OR_DEPARTMENT_OTHER): Attending: Primary Care | Admitting: Pulmonary Disease

## 2024-06-17 DIAGNOSIS — G4733 Obstructive sleep apnea (adult) (pediatric): Secondary | ICD-10-CM | POA: Insufficient documentation

## 2024-06-20 ENCOUNTER — Other Ambulatory Visit: Payer: Self-pay | Admitting: Family Medicine

## 2024-06-20 DIAGNOSIS — F339 Major depressive disorder, recurrent, unspecified: Secondary | ICD-10-CM

## 2024-06-26 ENCOUNTER — Ambulatory Visit: Admitting: Family Medicine

## 2024-06-26 ENCOUNTER — Encounter: Payer: Self-pay | Admitting: Family Medicine

## 2024-06-26 VITALS — BP 142/78 | HR 68 | Ht 69.0 in | Wt 274.8 lb

## 2024-06-26 DIAGNOSIS — I10 Essential (primary) hypertension: Secondary | ICD-10-CM | POA: Diagnosis not present

## 2024-06-26 DIAGNOSIS — G4733 Obstructive sleep apnea (adult) (pediatric): Secondary | ICD-10-CM | POA: Diagnosis not present

## 2024-06-26 DIAGNOSIS — Z23 Encounter for immunization: Secondary | ICD-10-CM

## 2024-06-26 DIAGNOSIS — Z8673 Personal history of transient ischemic attack (TIA), and cerebral infarction without residual deficits: Secondary | ICD-10-CM

## 2024-06-26 DIAGNOSIS — R4701 Aphasia: Secondary | ICD-10-CM | POA: Diagnosis not present

## 2024-06-26 MED ORDER — LOSARTAN POTASSIUM-HCTZ 50-12.5 MG PO TABS: 1.0000 | ORAL_TABLET | Freq: Every day | ORAL | 3 refills | Status: DC

## 2024-06-26 NOTE — Progress Notes (Signed)
   Subjective:    Patient ID: Brandon Sherwood Acie Mickey., male    DOB: February 10, 1971, 53 y.o.   MRN: [id number]  Discussed the use of AI scribe software for clinical note transcription with the patient, who gave verbal consent to proceed.  History of Present Illness   Brandon [name]. Brandon [name] is a 53 year old male with underlying heart disease who presents for a follow-up visit.  He is currently taking Brilinta  and another medication for blood pressure management. His blood pressure was noted to be 'one forty something over 34', He has a history of speech difficulties, characterized by halting speech where he 'couldn't get the words out'. He attended one session of speech therapy but had to delay further sessions due to cost. He plans to resume therapy this month and reports improvement in his speech, noting that it 'sounds better'.  He experienced weakness in his right arm, which has since resolved completely. He confirms that he has seen a neurologist.  He has a history of sleep apnea and previously used a CPAP machine,, a nasal device worked well for him. He recently underwent a sleep study but has not yet reviewed the results.  He is preparing for travel to [place] on October 3rd and is concerned about necessary immunizations, including yellow fever.  He has reduced his smoking to five cigarettes a day and notes that the smell of cigarettes now bothers him.           Review of Systems     Objective:    Physical Exam Physical Exam    Alert and in no distress.  Speech pattern appears normal.    Blood pressure is elevated.       Assessment & Plan:  Assessment and Plan    Coronary artery disease Blood pressure control prioritized to reduce cardiovascular risk. - Continue Brilinta . - Monitor blood pressure.  Hypertension Blood pressure 140/80 mmHg, possibly elevated due to recent coffee intake. Reassessment planned in a relaxed setting. - Bring blood  pressure cuff to next appointment for accuracy check. - Continue current antihypertensive medications and add losartan /HCTZ to his regimen..  Obstructive sleep apnea Previous CPAP ineffective, nasal device beneficial. Awaiting recent sleep study results for further management. - Follow up on sleep study results. - Consider reinitiating nasal device based on study results.  Tobacco use disorder Reduced smoking to five cigarettes per day. Acknowledged need for complete cessation for health benefits. - Encourage continued reduction and cessation of smoking.  Aphasia, improving Speech improving. Attended one speech therapy session, plans to continue. - Continue speech therapy.  General Health Maintenance Travel to [place] planned. Yellow fever vaccination recommended. Flu vaccination  - Contact health department for yellow fever vaccination. - Administer flu shot.

## 2024-06-27 NOTE — Procedures (Signed)
 [facility] [address] [place], [place] [address] Tel: [phone]   Fax: [phone]  Split Night Interpretation  Patient Name:  RENOLD, KOZAR Date:  06/17/2024 Referring Physician:  ALMARIE FERRARI [phone]) %%startinterp%% Indications for Polysomnography The patient is a 53 year old Male who is 5' 9 and weighs 280.0 lbs.  His BMI equals 41.0. He has a history of severe sleep apnea, diagnosed in August 2021, with a sleep study showing 67 apneic events per hour. A titration study in September 2021 indicated the need for auto CPAP with pressures of 6 to 15 cm H2O. He then underwent BIPAP titration study that indicated need for autp BIPAP 6-20cm h20/ max IPAP 20; Min EPAP 6, PS 4. Mask-ResMed Airfit F20, FFM medium size.   A diagnostic polysomnogram was performed to reassess OSA.After 142.0 minutes of sleep time the patient exhibited sufficient respiratory events qualifying him for a CPAP trial which was then initiated.    Polysomnogram Data A full night polysomnogram was performed recording the standard physiologic parameters including EEG, EOG, EMG, EKG, nasal and oral airflow.  Respiratory parameters of chest and abdominal movements are recorded with Piezo-Crystal motion transducers.  Oxygen  saturation was recorded by pulse oximetry.    Sleep Architecture The total recording time of the diagnostic portion of the study was 296.7 minutes.  The total sleep time was 142.0 minutes.  During the diagnostic portion of the study, the patient spent 4.9% of total sleep time in Stage N1, 79.6% in Stage N2, 15.5% in Stages N3, and 0.0% in REM.   Sleep latency was 129.7 minutes.  REM latency was - minutes.  Sleep Efficiency was 47.9%.  Wake after Sleep Onset time was 25.0 minutes.   At 01:49:21 AM the patient was placed on PAP treatment and was titrated at pressures ranging from 5/2* cm/H20 with supplemental oxygen  at - up to 20/12/18** cm/H20 with  supplemental oxygen  at -.  The total recording time of the treatment portion of the study was 219.6 minutes.  The total sleep time was 143.5 minutes.  During the treatment portion of the study, the patient spent 5.2% of total sleep time in Stage N1, 82.6% in Stage N2, 0.0% in Stages N3, and 12.2% in REM.   Sleep latency was 30.0 minutes.  REM latency was 81.0 minutes.  Sleep Efficiency was 65.3%.  Wake after Sleep Onset time was 46.0 minutes.  Respiratory Events During the diagnostic portion of the study, the polysomnogram revealed a presence of 2 obstructive, 1 central, and 2 mixed apneas resulting in an Apnea index of 2.1 events per hour.  There were 130 hypopneas (>=3% desaturation and/or arousal) resulting in an Apnea\Hypopnea Index (AHI >=3% desaturation and/or arousal) of 57.0 events per hour.  There were 82 hypopneas (>=4% desaturation) resulting in an Apnea\Hypopnea Index (AHI >=4% desaturation) of 36.8 events per hour.  There were 9 Respiratory Effort Related Arousals resulting in a RERA index of 3.8 events per hour. The Respiratory Disturbance Index is 60.8 events per hour.  The snore index was 297.5 events per hour.  Mean oxygen  saturation was 92.1%.  The lowest oxygen  saturation during sleep was 85.0%.  Time spent <=88% oxygen  saturation was 2.0 minutes (0.7%).  During the treatment portion of the study, the polysomnogram revealed a presence of - obstructive, 41 central, and 2 mixed apneas resulting in an Apnea index of 18.0 events per hour.  There were 129 hypopneas (>=3% desaturation and/or arousal) resulting in an Apnea\Hypopnea Index (AHI >=3%  desaturation and/or arousal) of 71.9 events per hour.  There were 62 hypopneas (>=4% desaturation) resulting in an Apnea\Hypopnea Index (AHI >=4% desaturation) of 43.9 events per hour.  There were 10 Respiratory Effort Related Arousals resulting in a RERA index of 4.2 events per hour. The Respiratory Disturbance Index is 76.1 events per hour.  The snore  index was 58.5 events per hour.  Mean oxygen  saturation was 92.6%.  The lowest oxygen  saturation during sleep was 86.0%.  Time spent <=88% oxygen  saturation was 2.0 minutes (0.9%).  Limb Activity During the diagnostic portion of the study, there were 154 limb movements recorded.  Of this total, 154 were classified as PLMs.  Of the PLMs, - were associated with arousals.  The Limb Movement index was 65.1 per hour while the PLM index was 65.1 per hour.  During the treatment portion of the study, there were 14 limb movements recorded.  Of this total, 14 were classified as PLMs.  Of the PLMs, - were associated with arousals.  The Limb Movement index was 5.9 per hour while the PLM index was 5.9 per hour.  Cardiac Summary During the diagnostic portion of the study, the average pulse rate was 60.3 bpm.  The minimum pulse rate was 48.0 bpm while the maximum pulse rate was 81.0 bpm.  During the treatment portion of the study, the average pulse rate was 49.0 bpm.  The minimum pulse rate was 42.0 bpm while the maximum pulse rate was 85.0 bpm.   Comments : CPAP was titrated to 11cm. Due to the emergence of CSA, the patient was switched to BPAP at a pressure of 13/9 cm H2O and adjusted to 20/12 cm H2O with a backup rate of 18. The backup rate was obtained through calculating the patient's breathe rate while awake and subtracting 2 from it. A pressure spread of 8 was used to also help combat central events. Due to time constraints and fragmented sleep, the BPAP titration was aggressive to try to resolve events by the end of the study. Final pressure was 20/12 but hypopneas persisted. The patient tolerated the pressures well. A medium ResMed AirFit P10 nasal pillow mask was used during the titration. Due to oral venting, a chinstrap was applied successfully  Diagnosis: Severe OSA , Titration was suboptimal  Recommendations: Auto bilevel with back up rate of 16-18 Consider EPAP min 10, PS +4, IPAP max 20 Mask  options include nasal pillows with chin strap vs full face mask If central apneas persist, consider ASV titration study    This study was personally reviewed and electronically signed by: JUDE HARDEN GAILS, MD Accredited Board Certified in Sleep Medicine

## 2024-07-07 ENCOUNTER — Other Ambulatory Visit: Payer: Self-pay

## 2024-07-07 ENCOUNTER — Telehealth: Payer: Self-pay | Admitting: Cardiovascular Disease

## 2024-07-07 ENCOUNTER — Telehealth: Payer: Self-pay | Admitting: Family Medicine

## 2024-07-07 DIAGNOSIS — E785 Hyperlipidemia, unspecified: Secondary | ICD-10-CM

## 2024-07-07 MED ORDER — ATORVASTATIN CALCIUM 80 MG PO TABS: 80.0000 mg | ORAL_TABLET | Freq: Every day | ORAL | 3 refills | Status: DC

## 2024-07-07 NOTE — Telephone Encounter (Signed)
 Pt going out of country to [place] & needs written prescriptions for Celexa , Zetia , Losartan  & Metoprolol , please call when ready

## 2024-07-07 NOTE — Telephone Encounter (Signed)
 Pt would like a signed copy of atorvastatin  prescription so pt can travel out of the country October 1st. Pt can pick up copy in person later this week.

## 2024-07-07 NOTE — Progress Notes (Signed)
 Prescription printed for pt per his request.

## 2024-07-07 NOTE — Telephone Encounter (Signed)
 Called patient about message. Patient needs a printed prescription for his atorvastatin . Will send to Dr. Court and his nurse today.

## 2024-07-08 NOTE — Telephone Encounter (Signed)
 Late entry: prescription printed, signed by Dr. Court and handed to requesting nurse.

## 2024-07-09 NOTE — Telephone Encounter (Signed)
 I researched at [email or web address] & it states travel to [place] requires medications be in original containers & written prescriptions from Doctor

## 2024-07-10 ENCOUNTER — Telehealth: Payer: Self-pay

## 2024-07-10 NOTE — Telephone Encounter (Signed)
 I spoke to pt. Pt states he has not used his CPAP or any form of treatment for OSA in a few years. Pt will keep tomorrow's appt to discuss potential treatment options. NFN

## 2024-07-10 NOTE — Telephone Encounter (Signed)
 Written prescriptions ready for pt to pick up, pt informed

## 2024-07-11 ENCOUNTER — Encounter: Payer: Self-pay | Admitting: Primary Care

## 2024-07-11 ENCOUNTER — Telehealth: Payer: Self-pay | Admitting: Primary Care

## 2024-07-11 ENCOUNTER — Ambulatory Visit: Admitting: Primary Care

## 2024-07-11 VITALS — BP 145/96 | HR 67 | Temp 97.8°F | Ht 69.0 in | Wt 276.6 lb

## 2024-07-11 DIAGNOSIS — E669 Obesity, unspecified: Secondary | ICD-10-CM | POA: Diagnosis not present

## 2024-07-11 DIAGNOSIS — Z6841 Body Mass Index (BMI) 40.0 and over, adult: Secondary | ICD-10-CM

## 2024-07-11 DIAGNOSIS — J984 Other disorders of lung: Secondary | ICD-10-CM

## 2024-07-11 DIAGNOSIS — G4733 Obstructive sleep apnea (adult) (pediatric): Secondary | ICD-10-CM

## 2024-07-11 MED ORDER — ZEPBOUND 2.5 MG/0.5ML ~~LOC~~ SOAJ: 2.5000 mg | SUBCUTANEOUS | 1 refills | Status: DC

## 2024-07-11 NOTE — Progress Notes (Signed)
 @Patient  ID: Brandon [name]., male    DOB: [birth year], 53 y.o.   MRN: [id number]  Chief Complaint  Patient presents with   Obstructive Sleep Apnea    Discuss treatment options    Referring provider: Joyce Norleen BROCKS, MD  HPI: 53 year old male, current every day smoker (34-pack-year history).  Past medical history significant for hypertension, thoracic aortic aneurysm without rupture, arteriosclerotic cardiovascular disease, sleep apnea, tobacco use.  Patient of Dr. Neda, seen for initial consult in May 2021 for shortness of breath.  Started on Breo Ellipta  10. Patient ordered for pulmonary function testing.   Previous [facility] pulmonary encounter: 04/30/2020 Patient presents today for 14-month follow-up. He had LLL pneumonia in late April. States that his shortness of breath is not as bad. He is taking Breo Ellipta  daily as prescribed. He has noticed some improvement with this inhaler but not an overwhelming difference. He is still smoking 1ppd. He is not ready to quit but is thinking about it. He reports getting bronchitis every other year in the fall. States that he has put on a lot of weight over the last year. He is considering going on a 1,200cal diet. He reports loud snoring at night. He has not had sleep test yet. BNP normal in April. Eosinophils 400 in February 2021. Denies cough, chest tightness or wheezing.    03/04/2024  Discussed the use of AI scribe software for clinical note transcription with the patient, who gave verbal consent to proceed.  History of Present Illness   Brandon [name] is a 53 year old male with severe sleep apnea who presents for a sleep consult due to worsening sleep quality and migraines.  He has a history of severe sleep apnea, diagnosed in August 2021, with a sleep study showing 67 apneic events per hour. A titration study in September 2021 indicated the need for auto CPAP with pressures of 6 to 15 cm H2O. He then underwent BIPAP titration study  that indicated need for autp BIPAP 6-20cm h20/ max IPAP 20; Min EPAP 6, PS 4. Mask-ResMed Airfit F20, FFM medium size.   He tells me that he was previously used a CPAP with a full face mask for about six months but discontinued due to discomfort while sleeping on his back.  He experiences morning headaches and feels unrested upon waking. By early afternoon, he often feels sleepy and has difficulty staying awake. No waking up gasping or choking, but he wakes up three to four times a night to urinate. He has been wheezing for the past three years and has been told he snores lightly.  His typical bedtime is 11 PM, and it takes about an hour to fall asleep. He wakes up multiple times during the night and starts his day at 6 AM. He has experienced significant weight fluctuations, losing 50 pounds but regaining 40 pounds, particularly during the winter months.  He has a collar size greater than 17 inches and has a history of stroke and myocardial infarction.      1. OSA (obstructive sleep apnea) (Primary) - Split night study; Future  Assessment and Plan    Obstructive sleep apnea Severe obstructive sleep apnea diagnosed in August 2021 with 67 apneic events per hour. Previous CPAP therapy was not tolerated due to difficulty sleeping on back and issues with full face mask. Current symptoms include morning headaches, daytime sleepiness, restless sleep, and nocturia 3-4 times per night. Light snoring has been observed in the past. Patient  has experienced episodes of waking up feeling like he stopped breathing or struggled to breathe. Stroke and three myocardial infarctions increase the importance of managing sleep apnea to reduce risk of cardiac arrhythmias, stroke, pulmonary hypertension, and diabetes. Discussed need to repeat sleep study for insurance purposes and to obtain new CPAP or BiPAP machine. Emphasized risks of untreated sleep apnea, including cardiac arrhythmias, stroke, and pulmonary  hypertension. - Order split night sleep study to reestablish diagnosis of obstructive sleep apnea and titrate CPAP versus BiPAP. - Recommend trial of nasal mask due to previous intolerance of full face mask. - Consider mask desensitization study or mask fitting in person at DME company. - Advise use of CPAP pillow for side sleepers to improve comfort. - Schedule follow-up in three months for compliance check.   07/11/2024- Interim hx  Discussed the use of AI scribe software for clinical note transcription with the patient, who gave verbal consent to proceed. History of Present Illness Brandon [name]. [name] is a 53 year old male with severe sleep apnea who presents for follow-up after a sleep study and BiPAP titration.  He has a history of severe sleep apnea, initially diagnosed in 2021, with a sleep study showing 67 apneic events per hour. He was initially on CPAP therapy but discontinued it due to discomfort when sleeping on his back. A subsequent BiPAP titration study indicated the need for auto BiPAP 6-20/ max IPAP 20; min EPAP 6, PS4.   He previously used a medium full face mask and has experienced morning headaches, feeling unrested, and tiredness by early afternoon, with difficulty staying awake. He wakes up 3-4 times a night to use the restroom and has been told he snores slightly.  Due to break in therapy a split night sleep study was obtained which reconfirmed severe sleep apnea with 36 apneic events per hour (4% AHI criteria). During the sleep study he was tried a nasal mask with a chin strap, which he found more comfortable.  He has a history of CAD. He has lost weight from 305 lbs to 276 lbs but has plateaued. He is not diabetic and has no personal or family history of thyroid cancer. He currently uses Repatha  injections twice a month for cholesterol management.  Pulmonary function testing: 04/30/2020-FVC 2.64 (50%), FEV1 2.32 (57%), ratio 88, TLC 68%, DLCOcor 22.78  (75%) Interpretation: Moderate to severe restriction with no overt obstruction. No BD response. Mild diffusion defect  06/17/24 split night sleep study>> AHI (4%) 36.8/hour with SpO2 low 85%. BIPAP pressure 20/12 with residual hyponeas. Recommend max IPAP 20/Min EPAP 10 with PS 4  No Known Allergies  Immunization History  Administered Date(s) Administered   Influenza Whole 06/27/2011   Influenza, Seasonal, Injecte, Preservative Fre 09/24/2023, 06/26/2024   Influenza,inj,Quad PF,6+ Mos 06/19/2017, 07/24/2022   Moderna Sars-Covid-2 Vaccination 10/28/2020   PFIZER(Purple Top)SARS-COV-2 Vaccination 01/15/2020, 02/07/2020   PNEUMOCOCCAL CONJUGATE-20 06/08/2021   Tdap 04/29/2020    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, due to reduced neck mobility   Heart attack (HCC)    History of noncompliance  with medical treatment    Due to financial considerations   Hyperlipidemia    Hypertension    Stroke [facility])    Tobacco abuse    40 pack years    Tobacco History: Social History   Tobacco Use  Smoking Status Every Day   Current packs/day: 1.00   Average packs/day: 1 pack/day for 34.0 years (34.0 ttl pk-yrs)   Types: Cigarettes   Passive exposure: Current  Smokeless Tobacco Never  Tobacco Comments   Smokes 1ppd. AB, CMA 03-04-24   Ready to quit: Not Answered Counseling given: Not Answered Tobacco comments: Smokes 1ppd. AB, CMA 03-04-24   Outpatient Medications Prior to Visit  Medication Sig Dispense Refill    aspirin  EC 81 MG tablet Take 1 tablet (81 mg total) by mouth daily. Swallow whole. (Patient taking differently: Take 325 mg by mouth daily. Swallow whole.) 90 tablet 3   atorvastatin  (LIPITOR ) 80 MG tablet Take 1 tablet (80 mg total) by mouth daily. 90 tablet 3   citalopram  (CELEXA ) 20 MG tablet Take 1 tablet by mouth once daily 30 tablet 0   Evolocumab  (REPATHA  SURECLICK) 140 MG/ML SOAJ INJECT 1 PEN SUBCUTANEOUSLY EVERY 14 DAYS 6 mL 3   ezetimibe  (ZETIA ) 10 MG tablet Take 1 tablet by mouth once daily 90 tablet 0   losartan -hydrochlorothiazide (HYZAAR) 50-12.5 MG tablet Take 1 tablet by mouth daily. 90 tablet 3   metoprolol  succinate (TOPROL -XL) 50 MG 24 hr tablet Take 50 mg by mouth daily.     No facility-administered medications prior to visit.   Review of Systems  Review of Systems  Constitutional:  Positive for fatigue.  Respiratory: Negative.    Cardiovascular: Negative.      Physical Exam  BP (!) 145/96   Pulse 67   Temp 97.8 F (36.6 C)   Ht 5' 9 (1.753 m)   Wt 276 lb 9.6 oz (125.5 kg)   SpO2 94% Comment: RA  BMI 40.85 kg/m  Physical Exam Constitutional:      Appearance: Normal appearance. He is well-developed.  HENT:     Head: Normocephalic and atraumatic.     Mouth/Throat:     Mouth: Mucous membranes are moist.     Pharynx: Oropharynx is clear.  Cardiovascular:     Rate and Rhythm: Normal rate and regular rhythm.     Heart sounds: Normal heart sounds.  Pulmonary:     Effort: Pulmonary effort is normal. No respiratory distress.     Breath sounds: Normal breath sounds. No wheezing or rhonchi.  Musculoskeletal:        General: Normal range of motion.     Cervical back: Normal range of motion and neck supple.  Skin:    General: Skin is warm and dry.     Findings: No erythema or rash.  Neurological:     General: No focal deficit present.     Mental Status: He is alert and oriented to person, place, and time. Mental status is at baseline.  Psychiatric:         Mood and Affect: Mood normal.        Behavior: Behavior normal.        Thought Content: Thought content normal.        Judgment: Judgment normal.       Lab Results:  CBC    Component Value Date/Time   WBC 7.5 05/12/2024 1800   RBC 4.89 05/12/2024 1800   HGB 15.7 05/12/2024 1800   HGB 16.5 10/31/2019 1432  HCT 45.3 05/12/2024 1800   HCT 47.5 10/31/2019 1432   PLT 153 05/12/2024 1800   PLT 172 10/31/2019 1432   MCV 92.6 05/12/2024 1800   MCV 92 10/31/2019 1432   MCH 32.1 05/12/2024 1800   MCHC 34.7 05/12/2024 1800   RDW 13.8 05/12/2024 1800   RDW 13.5 10/31/2019 1432   LYMPHSABS 2.9 05/12/2024 1800   LYMPHSABS 3.5 (H) 10/31/2019 1432   MONOABS 0.5 05/12/2024 1800   EOSABS 0.3 05/12/2024 1800   EOSABS 0.4 10/31/2019 1432   BASOSABS 0.1 05/12/2024 1800   BASOSABS 0.1 10/31/2019 1432    BMET    Component Value Date/Time   NA 137 05/12/2024 1800   NA 140 09/24/2023 1427   K 3.4 (L) 05/12/2024 1800   CL 103 05/12/2024 1800   CO2 25 05/12/2024 1800   GLUCOSE 108 (H) 05/12/2024 1800   BUN 15 05/12/2024 1800   BUN 13 09/24/2023 1427   CREATININE 0.96 05/12/2024 1800   CREATININE 0.92 06/19/2017 1004   CALCIUM  9.2 05/12/2024 1800   GFRNONAA >60 05/12/2024 1800   GFRAA >60 02/19/2020 1356    BNP    Component Value Date/Time   BNP 68.5 02/19/2020 1414    ProBNP No results found for: PROBNP  Imaging: Split night study Result Date: 06/17/2024 Jude Harden GAILS, MD     06/27/2024  6:31 AM Darryle Law [facility] [address] [place], [place] [address] Tel: [phone]   Fax: [phone] Split Night Interpretation Patient Name:  LUISMIGUEL, LAMERE Date:  06/17/2024 Referring Physician:  ALMARIE FERRARI [phone]) %%startinterp%% Indications for Polysomnography The patient is a 53 year old Male who is 5' 9 and weighs 280.0 lbs.  His BMI equals 41.0. He has a history of severe sleep apnea, diagnosed in August 2021, with a sleep study showing 67  apneic events per hour. A titration study in September 2021 indicated the need for auto CPAP with pressures of 6 to 15 cm H2O. He then underwent BIPAP titration study that indicated need for autp BIPAP 6-20cm h20/ max IPAP 20; Min EPAP 6, PS 4. Mask-ResMed Airfit F20, FFM medium size.  A diagnostic polysomnogram was performed to reassess OSA.After 142.0 minutes of sleep time the patient exhibited sufficient respiratory events qualifying him for a CPAP trial which was then initiated.  Polysomnogram Data A full night polysomnogram was performed recording the standard physiologic parameters including EEG, EOG, EMG, EKG, nasal and oral airflow.  Respiratory parameters of chest and abdominal movements are recorded with Piezo-Crystal motion transducers.  Oxygen  saturation was recorded by pulse oximetry.  Sleep Architecture The total recording time of the diagnostic portion of the study was 296.7 minutes.  The total sleep time was 142.0 minutes.  During the diagnostic portion of the study, the patient spent 4.9% of total sleep time in Stage N1, 79.6% in Stage N2, 15.5% in Stages N3, and 0.0% in REM.   Sleep latency was 129.7 minutes.  REM latency was - minutes.  Sleep Efficiency was 47.9%.  Wake after Sleep Onset time was 25.0 minutes. At 01:49:21 AM the patient was placed on PAP treatment and was titrated at pressures ranging from 5/2* cm/H20 with supplemental oxygen  at - up to 20/12/18** cm/H20 with supplemental oxygen  at -.  The total recording time of the treatment portion of the study was 219.6 minutes.  The total sleep time was 143.5 minutes.  During the treatment portion of the study, the patient spent 5.2% of total sleep time in Stage  N1, 82.6% in Stage N2, 0.0% in Stages N3, and 12.2% in REM.   Sleep latency was 30.0 minutes.  REM latency was 81.0 minutes.  Sleep Efficiency was 65.3%.  Wake after Sleep Onset time was 46.0 minutes. Respiratory Events During the diagnostic portion of the study, the polysomnogram  revealed a presence of 2 obstructive, 1 central, and 2 mixed apneas resulting in an Apnea index of 2.1 events per hour.  There were 130 hypopneas (>=3% desaturation and/or arousal) resulting in an Apnea\Hypopnea Index (AHI >=3% desaturation and/or arousal) of 57.0 events per hour.  There were 82 hypopneas (>=4% desaturation) resulting in an Apnea\Hypopnea Index (AHI >=4% desaturation) of 36.8 events per hour.  There were 9 Respiratory Effort Related Arousals resulting in a RERA index of 3.8 events per hour. The Respiratory Disturbance Index is 60.8 events per hour.  The snore index was 297.5 events per hour.  Mean oxygen  saturation was 92.1%.  The lowest oxygen  saturation during sleep was 85.0%.  Time spent <=88% oxygen  saturation was 2.0 minutes (0.7%). During the treatment portion of the study, the polysomnogram revealed a presence of - obstructive, 41 central, and 2 mixed apneas resulting in an Apnea index of 18.0 events per hour.  There were 129 hypopneas (>=3% desaturation and/or arousal) resulting in an Apnea\Hypopnea Index (AHI >=3% desaturation and/or arousal) of 71.9 events per hour.  There were 62 hypopneas (>=4% desaturation) resulting in an Apnea\Hypopnea Index (AHI >=4% desaturation) of 43.9 events per hour.  There were 10 Respiratory Effort Related Arousals resulting in a RERA index of 4.2 events per hour. The Respiratory Disturbance Index is 76.1 events per hour.  The snore index was 58.5 events per hour.  Mean oxygen  saturation was 92.6%.  The lowest oxygen  saturation during sleep was 86.0%.  Time spent <=88% oxygen  saturation was 2.0 minutes (0.9%). Limb Activity During the diagnostic portion of the study, there were 154 limb movements recorded.  Of this total, 154 were classified as PLMs.  Of the PLMs, - were associated with arousals.  The Limb Movement index was 65.1 per hour while the PLM index was 65.1 per hour. During the treatment portion of the study, there were 14 limb movements recorded.   Of this total, 14 were classified as PLMs.  Of the PLMs, - were associated with arousals.  The Limb Movement index was 5.9 per hour while the PLM index was 5.9 per hour. Cardiac Summary During the diagnostic portion of the study, the average pulse rate was 60.3 bpm.  The minimum pulse rate was 48.0 bpm while the maximum pulse rate was 81.0 bpm. During the treatment portion of the study, the average pulse rate was 49.0 bpm.  The minimum pulse rate was 42.0 bpm while the maximum pulse rate was 85.0 bpm. Comments : CPAP was titrated to 11cm. Due to the emergence of CSA, the patient was switched to BPAP at a pressure of 13/9 cm H2O and adjusted to 20/12 cm H2O with a backup rate of 18. The backup rate was obtained through calculating the patient's breathe rate while awake and subtracting 2 from it. A pressure spread of 8 was used to also help combat central events. Due to time constraints and fragmented sleep, the BPAP titration was aggressive to try to resolve events by the end of the study. Final pressure was 20/12 but hypopneas persisted. The patient tolerated the pressures well. A medium ResMed AirFit P10 nasal pillow mask was used during the titration. Due to oral venting, a chinstrap  was applied successfully Diagnosis: Severe OSA , Titration was suboptimal Recommendations: Auto bilevel with back up rate of 16-18 Consider EPAP min 10, PS +4, IPAP max 20 Mask options include nasal pillows with chin strap vs full face mask If central apneas persist, consider ASV titration study This study was personally reviewed and electronically signed by: JUDE HARDEN GAILS, MD Accredited Board Certified in Sleep Medicine    Assessment & Plan:   1. OSA (obstructive sleep apnea) (Primary)  2. Restrictive lung disease   Assessment and Plan Assessment & Plan Severe obstructive sleep apnea Severe obstructive sleep apnea with 36 apneic events per hour. Previous CPAP therapy was discontinued due to discomfort. Recent BiPAP  titration study recommended auto BiPAP settings with max IPAP of 20 and EPAP of 6. Symptoms include morning headaches, feeling unrested, daytime fatigue, snoring, and nocturia. He is open to resuming BiPAP therapy with a nasal mask and chin strap, which he found more comfortable. Discussed the importance of BiPAP therapy for overall health and cardiac health. If issues with comfort or emergent apneas persist, a repeat titration study may be necessary. - Order new BiPAP machine EPAP min 10, PS +4, IPAP max 20 with nasal pillows with chin strap  - Schedule follow-up in 6-8 weeks to review BiPAP usage data - If central apneas persist, consider ASV titration study    Obesity Obesity with a BMI of 40 Discussed potential benefits of weight loss on sleep apnea and overall health, including cardiac health. Consideration of Zepbound , an FDA-approved weight loss medication for sleep apnea in patients with obesity. He has a history of CAD. He is not diabetic and has no personal or family history of thyroid cancer. He is open to weight loss medication and is already familiar with self-injections. Discussed potential side effects of Zepbound , including nausea, vomiting, diarrhea, and constipation.  - We will reach out to patients primary care provider to discuss potential use of GLP medication  - Initiate insurance approval process for Zepbound  - Provide information on Zepbound , including potential side effects and injection schedule - Plan to start Zepbound  2.5mg  [place] once weekly pending approval and insurance authorization - Schedule follow-up 6-8 weeks after starting Zepbound  to assess progress  35 mins spent on case; >50% face to face with patient   Almarie LELON Ferrari, NP 07/11/2024

## 2024-07-11 NOTE — Telephone Encounter (Signed)
 Are you alright with pulmonary starting patient on GLP/GIP medication, Zepbound  (tirzepatide ) for obesity and OSA?   Vincent Ferrari, NP [facility]

## 2024-07-11 NOTE — Patient Instructions (Addendum)
 VISIT SUMMARY: Today, you had a follow-up appointment to discuss your severe sleep apnea and recent sleep study results. We also reviewed your weight loss progress and potential new treatments to help manage your sleep apnea and overall health.  YOUR PLAN: -SEVERE OBSTRUCTIVE SLEEP APNEA: Severe obstructive sleep apnea is a condition where your airway becomes blocked during sleep, causing breathing interruptions. Your recent sleep study showed 36 apneic events per hour. We discussed resuming BiPAP therapy with a nasal mask and chin strap, which you found more comfortable. This therapy is important for your overall and cardiac health. We will order a new BiPAP machine with the recommended settings and schedule a follow-up in 6-8 weeks to review your usage data. If you continue to have issues, a repeat titration study may be necessary.  -OBESITY: Obesity is a condition where you have an excessive amount of body fat, which can impact your health. Your current weight is 276 lbs with a BMI of 40. We discussed the benefits of weight loss on your sleep apnea and overall health. You are open to trying Zepbound , a weight loss medication. We will contact your cardiologist to discuss this option and initiate the insurance approval process. You will receive information on Zepbound , including potential side effects and the injection schedule. We plan to start the medication after getting approval from your cardiologist and insurance. A follow-up will be scheduled 6-8 weeks after starting Zepbound  to assess your progress.  INSTRUCTIONS: Please follow up in 6-8 weeks to review your BiPAP usage data and assess your progress with Zepbound , if started. Make sure to consult with your cardiologist about starting Zepbound  and await insurance authorization before beginning the medication.  Orders: Start BIPAP at night 4-6 hours or longer with nasal mask and chin strap  Start Zepbound  pending cardiology approval- 2.5mg  once  weekly injection   Follow-up 6-8 weeks with Beth NP for bipap compliance and zepbound  follow-up    Tirzepatide  Injection (Weight Management) What is this medication? TIRZEPATIDE  (tir ZEP a tide) promotes weight loss. It may also be used to maintain weight loss.  It works by decreasing appetite. It can be used to treat sleep apnea. Changes to diet and exercise are often combined with this medication. This medicine may be used for other purposes; ask your health care provider or pharmacist if you have questions. COMMON BRAND NAME(S): Zepbound  What should I tell my care team before I take this medication? They need to know if you have any of these conditions: Diabetes Eye disease caused by diabetes Gallbladder disease Have or have had depression Have or have had pancreatitis Having surgery Kidney disease Personal or family history of MEN 2, a condition that causes endocrine gland tumors Personal or family history of thyroid cancer Stomach or intestine problems, such as problems digesting food Suicidal thoughts, plans, or attempt An unusual or allergic reaction to tirzepatide , other medications, foods, dyes, or preservatives Pregnant or trying to get pregnant Breastfeeding How should I use this medication? This medication is injected under the skin. You will be taught how to prepare and give it. Take it as directed on the prescription label. Keep taking it unless your care team tells you to stop. It is important that you put your used needles and syringes in a special sharps container. Do not put them in a trash can. If you do not have a sharps container, call your pharmacist or care team to get one. A special MedGuide will be given to you by the pharmacist with  each prescription and refill. Be sure to read this information carefully each time. This medication comes with INSTRUCTIONS FOR USE. Ask your pharmacist for directions on how to use this medication. Read the information carefully.  Talk to your pharmacist or care team if you have questions. Talk to your care team about the use of this medication in children. Special care may be needed. Overdosage: If you think you have taken too much of this medicine contact a poison control center or emergency room at once. NOTE: This medicine is only for you. Do not share this medicine with others. What if I miss a dose? If you miss a dose, take it as soon as you can unless it is more than 4 days (96 hours) late. If it is more than 4 days late, skip the missed dose. Take the next dose at the normal time. Do not take 2 doses within 3 days (72 hours) of each other. What may interact with this medication? Certain medications for diabetes, such as insulin, glyburide, glipizide This medication may affect how other medications work. Talk with your care team about all of the medications you take. They may suggest changes to your treatment plan to lower the risk of side effects and to make sure your medications work as intended. This list may not describe all possible interactions. Give your health care provider a list of all the medicines, herbs, non-prescription drugs, or dietary supplements you use. Also tell them if you smoke, drink alcohol, or use illegal drugs. Some items may interact with your medicine. What should I watch for while using this medication? Visit your care team for regular checks on your progress. Tell your care team if your condition does not start to get better or if it gets worse. Tell your care team if you are taking medication to treat diabetes, such as insulin or glipizide. This may increase your risk of low blood sugar. Know the symptoms of low blood sugar and how to treat it. Talk to your care team about your risk of cancer. You may be more at risk for certain types of cancer if you take this medication. Talk to your care team right away if you have a lump or swelling in your neck, hoarseness that does not go away, trouble  swallowing, shortness of breath, or trouble breathing. Make sure you stay hydrated while taking this medication. Drink water often. Eat fruits and veggies that have a high water content. Drink more water when it is hot or you are active. Talk to your care team right away if you have fever, infection, vomiting, diarrhea, or if you sweat a lot while taking this medication. The loss of too much body fluid may make it dangerous for you to take this medication. If you are going to need surgery or a procedure, tell your care team that you are taking this medication. Estrogen and progestin hormones that you take by mouth may not work as well while you are taking this medication. Switch to a non-oral contraceptive or add a barrier contraceptive for 4 weeks after starting this medication and after each dose increase. Talk to your care team about contraceptive options. They can help you find the option that works for you. Do not take this medication without first talking to your care team if you may be or could become pregnant. Your care team can help you find the option that works for you. Weight loss is not recommended during pregnancy. Talk to your care team  if you are breastfeeding. When recommended, this medication may be taken. Its use during breastfeeding has not been well studied. Your care team may suggest other options. What side effects may I notice from receiving this medication? Side effects that you should report to your care team as soon as possible: Allergic reactions--skin rash, itching, hives, swelling of the face, lips, tongue, or throat Change in vision Dehydration--increased thirst, dry mouth, feeling faint or lightheaded, headache, dark yellow or brown urine Fast or irregular heartbeat Gallbladder problems--severe stomach pain, nausea, vomiting, fever Kidney injury--decrease in the amount of urine, swelling of the ankles, hands, or feet Pancreatitis--severe stomach pain that spreads to your  back or gets worse after eating or when touched, fever, nausea, vomiting Thoughts of suicide or self-harm, worsening mood, feelings of depression Thyroid cancer--new mass or lump in the neck, pain or trouble swallowing, trouble breathing, hoarseness Side effects that usually do not require medical attention (report these to your care team if they continue or are bothersome): Constipation Diarrhea Loss of appetite Nausea Upset stomach This list may not describe all possible side effects. Call your doctor for medical advice about side effects. You may report side effects to FDA at 1-800-FDA-1088. Where should I keep my medication? Keep out of the reach of children and pets. Store in a refrigerator or at room temperature up to 30 degrees C (86 degrees F). Keep it in the original container. Protect from light. Refrigeration (preferred): Store in the refrigerator. Do not freeze. Get rid of any unused medication after the expiration date. Room temperature: This medication may be stored at room temperature for up to 21 days. If it is stored at room temperature, get rid of any unused medication after 21 days or after it expires, whichever is first. To get rid of medications that are no longer needed or have expired: Take the medication to a medication take-back program. Check with your pharmacy or law enforcement to find a location. If you cannot return the medication, ask your pharmacist or care team how to get rid of this medication safely. NOTE: This sheet is a summary. It may not cover all possible information. If you have questions about this medicine, talk to your doctor, pharmacist, or health care provider.  2025 Elsevier/Gold Standard (2023-11-06 00:00:00)   CPAP and BIPAP Information CPAP and BIPAP use air pressure to keep your airways open and help you breathe well. CPAP and BIPAP use different amounts of pressure. Your health care provider will tell you whether CPAP or BIPAP would be best  for you. CPAP stands for continuous positive airway pressure. With CPAP, the amount of pressure stays the same while you breathe in and out. BIPAP stands for bi-level positive airway pressure. With BIPAP, the amount of pressure will be higher when you breathe in and lower when you breathe out. This allows you to take bigger breaths. CPAP or BIPAP may be used in the hospital or at home. You may need to have a sleep study before your provider can order a device for you to use at home. What are the advantages? CPAP and BIPAP are most often used for obstructive sleep apnea to keep the airways from collapsing when the muscles relax during sleep. CPAP or BIPAP can be used if you have: Chronic obstructive pulmonary disease. Heart failure. Medical conditions that cause muscle weakness. Other problems that cause breathing to be shallow, weak, or difficult. What are the risks? Your provider will talk with you about risks. These may include:  Sores on your nose or face caused from the mask, prongs, or nasal pillows. Dry or stuffy nose or nosebleeds. Feeling gassy or bloated. Sinus or lung infection if the equipment is not cleaned well. When should CPAP or BIPAP be used? In most cases, CPAP or BIPAP is used during sleep at night or whenever the main sleep time happens. It's also used during naps. People with some medical conditions may need to wear the mask when they're awake. Follow instructions from your provider about when to use your CPAP or BIPAP. What happens during CPAP or BIPAP?  Both CPAP and BIPAP use a small machine that uses electricity to create air pressure. A long tube connects the device to a plastic mask. Air is blown through the mask into your nose or mouth. The amount of pressure that's used to blow the air can be adjusted. Your provider will set the pressure setting and help you find the best mask for you. Tips for using the mask There are different types and sizes of masks. If your  mask does not fit well, talk with your provider about getting a different one. Some common types of masks include: Full face masks, which fit over the mouth and nose. Nasal masks, which fit over the nose. Nasal pillow or prong masks, which fit into the nostrils. The mask needs to be snug to your face, so some people feel trapped or closed in at first. If you feel this way, you may need to get used to the mask. Hold the mask loosely over your nose or mouth and then gradually put the the mask on more snugly. Slowly increase the amount of time you use the mask. If you have trouble with your mask not fitting well or leaking, talk with your provider. Do not stop using the mask. Tips for using the device Follow instructions from your provider about how to and how often to use the device. For home use, CPAP and BIPAP devices come from home health care companies. There are many different brands. Your health insurance company will help to decide which device you get. Keep the CPAP or BIPAP device and attachments clean. Ask your home health care company or check the instruction book for cleaning instructions. Make sure the humidifier is filled with germ-free (sterile) water and is working correctly. This will help prevent a dry or stuffy nose or nosebleeds. A nasal saline mist or spray may keep your nose from getting dry and sore. Do not eat or drink while the CPAP or BIPAP device is on. Food or drinks could get pushed into your lungs by the pressure of the CPAP or BIPAP. Follow these instructions at home: Take over-the-counter and prescription medicines only as told by your provider. Do not smoke, vape, or use nicotine  or tobacco. Contact a health care provider if: You have redness or pressure sores on your head, face, mouth, or nose from the mask or headgear. You have trouble using the CPAP or BIPAP device. You have trouble going to sleep or staying asleep. Someone tells you that you snore even when  wearing your CPAP or BIPAP device. Get help right away if: You have trouble breathing. You feel confused. These symptoms may be an emergency. Get help right away. Call 911. Do not wait to see if the symptoms will go away. Do not drive yourself to the hospital. This information is not intended to replace advice given to you by your health care provider. Make sure you discuss any  questions you have with your health care provider. Document Revised: 01/31/2023 Document Reviewed: 01/31/2023 Elsevier Patient Education  2024 [employer].

## 2024-07-14 DIAGNOSIS — I639 Cerebral infarction, unspecified: Secondary | ICD-10-CM

## 2024-07-15 ENCOUNTER — Encounter (HOSPITAL_COMMUNITY): Admitting: Occupational Therapy

## 2024-07-15 ENCOUNTER — Telehealth (HOSPITAL_COMMUNITY): Payer: Self-pay | Admitting: Speech Pathology

## 2024-07-15 ENCOUNTER — Encounter (HOSPITAL_COMMUNITY): Admitting: Speech Pathology

## 2024-07-15 NOTE — Telephone Encounter (Signed)
 Telephone Call:  Pt did not show for his 1:45 PM SLE. SLP left voicemail to return call if he would like to reschedule and/or discontinue services.  Thank you,  Lamar Candy, CCC-SLP [phone]

## 2024-07-16 ENCOUNTER — Telehealth (HOSPITAL_COMMUNITY): Payer: Self-pay | Admitting: Occupational Therapy

## 2024-07-16 NOTE — Telephone Encounter (Signed)
 This OT called and left a HIPAA appropriate VM regarding No Show to Evaluation on 07/15/24. Pt will be added back to work que for rescheduling his evaluation.   Valentin Nightingale, OTR/L [facility] [phone]

## 2024-07-17 ENCOUNTER — Other Ambulatory Visit: Payer: Self-pay | Admitting: Family Medicine

## 2024-07-17 DIAGNOSIS — E782 Mixed hyperlipidemia: Secondary | ICD-10-CM

## 2024-07-18 ENCOUNTER — Telehealth: Payer: Self-pay

## 2024-07-18 ENCOUNTER — Other Ambulatory Visit (HOSPITAL_COMMUNITY): Payer: Self-pay

## 2024-07-18 NOTE — Telephone Encounter (Signed)
 Your request has been approved PA Case: [id number], Status: Approved, Coverage Starts on: 10/24/2023 12:00:00 AM, Coverage Ends on: 10/22/2024 12:00:00 AM. Questions? Contact [phone]. Authorization Expiration12/31/2025  Copay $12.15

## 2024-07-18 NOTE — Telephone Encounter (Signed)
 Pt notified on VM (DPR)

## 2024-07-18 NOTE — Telephone Encounter (Signed)
 Submitted to plan. Last name is listed as Acie Raddle.

## 2024-07-18 NOTE — Telephone Encounter (Signed)
*  Pulm  Pharmacy Patient Advocate Encounter   Received notification from CoverMyMeds that prior authorization for Zepbound   is required/requested.   Insurance verification completed.   The patient is insured through [place] .   Per test claim: PA required; PA started via CoverMyMeds. KEY [id number] . Waiting for clinical questions to populate.

## 2024-07-20 ENCOUNTER — Other Ambulatory Visit: Payer: Self-pay | Admitting: Family Medicine

## 2024-07-20 DIAGNOSIS — F339 Major depressive disorder, recurrent, unspecified: Secondary | ICD-10-CM

## 2024-07-21 NOTE — Telephone Encounter (Signed)
 Is this okay to refill?

## 2024-07-23 ENCOUNTER — Ambulatory Visit (INDEPENDENT_AMBULATORY_CARE_PROVIDER_SITE_OTHER): Admitting: Family Medicine

## 2024-07-23 ENCOUNTER — Encounter: Payer: Self-pay | Admitting: Family Medicine

## 2024-07-23 VITALS — BP 152/80 | HR 63 | Ht 69.0 in | Wt 273.2 lb

## 2024-07-23 DIAGNOSIS — I10 Essential (primary) hypertension: Secondary | ICD-10-CM | POA: Diagnosis not present

## 2024-07-23 DIAGNOSIS — Z8673 Personal history of transient ischemic attack (TIA), and cerebral infarction without residual deficits: Secondary | ICD-10-CM

## 2024-07-23 DIAGNOSIS — F339 Major depressive disorder, recurrent, unspecified: Secondary | ICD-10-CM | POA: Diagnosis not present

## 2024-07-23 DIAGNOSIS — G4733 Obstructive sleep apnea (adult) (pediatric): Secondary | ICD-10-CM | POA: Diagnosis not present

## 2024-07-23 MED ORDER — LOSARTAN POTASSIUM-HCTZ 100-12.5 MG PO TABS: 1.0000 | ORAL_TABLET | Freq: Every day | ORAL | 3 refills | Status: AC

## 2024-07-23 NOTE — Progress Notes (Signed)
   Subjective:    Patient ID: Brandon Sherwood Acie Mickey., male    DOB: 1971-04-03, 53 y.o.   MRN: [id number]  Discussed the use of AI scribe software for clinical note transcription with the patient, who gave verbal consent to proceed.  History of Present Illness   Brandon [name]. Brandon [name] is a 53 year old male with hypertension who presents for follow-up on blood pressure management.  His blood pressure readings have been elevated, with recent measurements showing 152/80 mmHg and 160/90 mmHg at home He mentions that his home blood pressure machine may be inaccurate, as it is old and possibly not the right size for him. He is currently taking losartan  and metoprolol  for his hypertension.  He has not yet started using his CPAP machine for sleep apnea, as he is waiting for adjustments following a recent sleep study.  He has had one consultation with a therapist for mental health support, but further sessions have been delayed due to the therapist's health issues. He feels 'okay' psychologically at the moment.  He is preparing for a trip to [place] and has received a flu shot as part of his travel preparations. He plans to stay with friends and is aware of precautions regarding food and water safety while traveling.           Review of Systems     Objective:     Physical Exam   VITALS: BP- 152/80     Alert and in no distress.  Speech is slightly slurred.       Assessment & Plan:     Hypertension Hypertension remains uncontrolled with elevated readings. Current home monitor may be inaccurate. - Increase losartan  to 100/12.5 mg daily. - Finish current supply of losartan  50/12.5 mg by taking two tablets daily until gone. - Advise obtaining a new blood pressure cuff and bring to next appointment. - Follow up in one month to reassess blood pressure.  Obstructive Sleep Apnea CPAP therapy not yet initiated. - Ensure CPAP therapy is initiated after vacation.  Psychological  Health He reports doing okay but has not continued therapy. - Encourage continuation of mental health support and therapy as needed.

## 2024-08-20 ENCOUNTER — Ambulatory Visit (HOSPITAL_COMMUNITY)
Admission: RE | Admit: 2024-08-20 | Discharge: 2024-08-20 | Disposition: A | Discharge status: Home / Self Care | Source: Ambulatory Visit | Attending: Cardiovascular Disease | Admitting: Cardiovascular Disease

## 2024-08-20 DIAGNOSIS — I6522 Occlusion and stenosis of left carotid artery: Secondary | ICD-10-CM | POA: Insufficient documentation

## 2024-08-21 ENCOUNTER — Ambulatory Visit: Payer: Self-pay | Admitting: Cardiovascular Disease

## 2024-08-26 ENCOUNTER — Telehealth: Payer: Self-pay

## 2024-08-26 ENCOUNTER — Ambulatory Visit: Payer: Self-pay | Admitting: Family Medicine

## 2024-08-26 MED ORDER — ZEPBOUND 2.5 MG/0.5ML ~~LOC~~ SOAJ: 2.5000 mg | SUBCUTANEOUS | 1 refills | Status: AC

## 2024-08-26 NOTE — Telephone Encounter (Signed)
 December would be fine, I don't recommend going without CPAP for long. He does definitely need it

## 2024-08-26 NOTE — Telephone Encounter (Signed)
 He can be started on zepbound  Starting at 2.5mcg [place] weekly and we will taper dose up to target dose 10mg  as tolerated  Please make sure patient gets below information if not already provided

## 2024-08-26 NOTE — Telephone Encounter (Signed)
 Copied from CRM [id number]. Topic: Clinical - Medical Advice >> Aug 26, 2024 11:27 AM Corean SAUNDERS wrote: Reason for CRM: Patient is requesting a call back from [place] as he would like to discuss Zepbound  again.  Spoke with the pt and notified Zepbound  is approved and he would like to start this.  Pt states he has not started CPAP due to having to pay $350 and some change. Pt says he will have to wait until December before he can afford that payment and start on CPAP.  Beth please advise.

## 2024-08-26 NOTE — Telephone Encounter (Signed)
 Pt is aware Zepbound  has been sent to pharmacy and I have mailed pt education for zepbound  to the pts address.   Pt states his son has an old cpap machine and he is going to get information about it and call our office back.

## 2024-08-26 NOTE — Telephone Encounter (Signed)
 Beth can the pt start on Zepbound  or would be need to wait until he is using CPAP.

## 2024-08-29 DIAGNOSIS — I252 Old myocardial infarction: Secondary | ICD-10-CM | POA: Diagnosis not present

## 2024-08-29 DIAGNOSIS — H9192 Unspecified hearing loss, left ear: Secondary | ICD-10-CM | POA: Diagnosis not present

## 2024-08-29 DIAGNOSIS — Z8249 Family history of ischemic heart disease and other diseases of the circulatory system: Secondary | ICD-10-CM | POA: Diagnosis not present

## 2024-08-29 DIAGNOSIS — F325 Major depressive disorder, single episode, in full remission: Secondary | ICD-10-CM | POA: Diagnosis not present

## 2024-08-29 DIAGNOSIS — I2510 Atherosclerotic heart disease of native coronary artery without angina pectoris: Secondary | ICD-10-CM | POA: Diagnosis not present

## 2024-08-29 DIAGNOSIS — Z7982 Long term (current) use of aspirin: Secondary | ICD-10-CM | POA: Diagnosis not present

## 2024-08-29 DIAGNOSIS — I69351 Hemiplegia and hemiparesis following cerebral infarction affecting right dominant side: Secondary | ICD-10-CM | POA: Diagnosis not present

## 2024-08-29 DIAGNOSIS — E785 Hyperlipidemia, unspecified: Secondary | ICD-10-CM | POA: Diagnosis not present

## 2024-09-05 ENCOUNTER — Telehealth: Payer: Self-pay

## 2024-09-05 NOTE — Telephone Encounter (Signed)
 I could not find pt in airview. I called [facility]. [facility] stated the pt is Not set up, pt recently went on a trip and told [facility] that he needed to recover financially and requested a call back in December.   Front desk, could we have pt reschedule for January to see BW so he has time to gain compliance once he is set up next month?

## 2024-09-05 NOTE — Telephone Encounter (Signed)
 Left voicemail for patient to call back to reschedule and sent mychart message

## 2024-09-08 ENCOUNTER — Ambulatory Visit: Admitting: Primary Care

## 2024-09-08 DIAGNOSIS — J984 Other disorders of lung: Secondary | ICD-10-CM

## 2024-09-08 DIAGNOSIS — G4733 Obstructive sleep apnea (adult) (pediatric): Secondary | ICD-10-CM

## 2024-09-08 NOTE — Telephone Encounter (Signed)
 Attempted to call patient for reschedule of his upcoming appt due to Hope, NP being out of office and regarding his CPAP compliance.   Patient will need to reschedule in January. Cancelling appt at this time (patient called x2 and mychart message sent 11/14)

## 2024-09-23 ENCOUNTER — Other Ambulatory Visit: Payer: Self-pay | Admitting: Cardiovascular Disease

## 2024-09-23 DIAGNOSIS — E785 Hyperlipidemia, unspecified: Secondary | ICD-10-CM

## 2024-10-15 ENCOUNTER — Other Ambulatory Visit: Payer: Self-pay | Admitting: Family Medicine

## 2024-10-15 DIAGNOSIS — F339 Major depressive disorder, recurrent, unspecified: Secondary | ICD-10-CM

## 2024-10-15 DIAGNOSIS — E782 Mixed hyperlipidemia: Secondary | ICD-10-CM

## 2024-10-15 DIAGNOSIS — I2510 Atherosclerotic heart disease of native coronary artery without angina pectoris: Secondary | ICD-10-CM

## 2024-10-24 ENCOUNTER — Telehealth: Payer: Self-pay

## 2024-10-24 ENCOUNTER — Other Ambulatory Visit (HOSPITAL_COMMUNITY): Payer: Self-pay

## 2024-10-24 NOTE — Telephone Encounter (Signed)
 Paper received from [facility] regarding pt's CPAP. Paper stated that the order will now be voided and cancelled due to multiple attempts made to contact pt and they were unsuccessful. It is also stated that if the pt wishes to proceed with order, then [facility] will reenter the order and coordinate care. Pt will need to be able to be easily contacted.   Placing copy of paper in the scan folder located in B POD

## 2024-10-24 NOTE — Telephone Encounter (Signed)
 Called and left detailed message on patients VM regarding prior authorization for Zepbound  2.5 mg pen.  Patient should be able to get rx filled on 11/05/2024.

## 2024-10-24 NOTE — Telephone Encounter (Signed)
*  Pulm  Pharmacy Patient Advocate Encounter   Received notification from Fax that prior authorization for Zepbound  2.5mg  pen  is required/requested.   Insurance verification completed.   The patient is insured through [place].   Per test claim: Refill too soon. PA is not needed at this time. Medication was filled 12/24. Next eligible fill date is 11/05/2024.   *patient has been on 2.5mg  for a while now it seems, has patient tried higher dosing? Insurance may not pay for the 2.5mg  dosing anymore without documentation of trial and failure of higher dosing.   Key: [id number] Last name: Clinten Howk

## 2024-10-27 DIAGNOSIS — E785 Hyperlipidemia, unspecified: Secondary | ICD-10-CM

## 2024-10-28 ENCOUNTER — Ambulatory Visit: Admitting: Primary Care

## 2024-11-04 ENCOUNTER — Ambulatory Visit: Admitting: *Deleted

## 2024-11-04 ENCOUNTER — Encounter: Payer: Self-pay | Admitting: Cardiovascular Disease

## 2024-11-04 ENCOUNTER — Ambulatory Visit: Admitting: Cardiovascular Disease

## 2024-11-04 VITALS — BP 102/62 | Ht 69.0 in | Wt 250.0 lb

## 2024-11-04 VITALS — BP 105/63 | HR 60 | Ht 69.0 in | Wt 245.0 lb

## 2024-11-04 DIAGNOSIS — Z720 Tobacco use: Secondary | ICD-10-CM | POA: Diagnosis not present

## 2024-11-04 DIAGNOSIS — I6522 Occlusion and stenosis of left carotid artery: Secondary | ICD-10-CM

## 2024-11-04 DIAGNOSIS — I10 Essential (primary) hypertension: Secondary | ICD-10-CM | POA: Diagnosis not present

## 2024-11-04 DIAGNOSIS — E782 Mixed hyperlipidemia: Secondary | ICD-10-CM

## 2024-11-04 DIAGNOSIS — G4733 Obstructive sleep apnea (adult) (pediatric): Secondary | ICD-10-CM | POA: Diagnosis not present

## 2024-11-04 DIAGNOSIS — I2510 Atherosclerotic heart disease of native coronary artery without angina pectoris: Secondary | ICD-10-CM

## 2024-11-04 DIAGNOSIS — Z0000 Encounter for general adult medical examination without abnormal findings: Secondary | ICD-10-CM

## 2024-11-04 DIAGNOSIS — Z122 Encounter for screening for malignant neoplasm of respiratory organs: Secondary | ICD-10-CM

## 2024-11-04 NOTE — Assessment & Plan Note (Signed)
 History of essential hypertension blood pressure measured today 105/63.  He is on losartan /hydrochlorothiazide and metoprolol .

## 2024-11-04 NOTE — Patient Instructions (Signed)
 Brandon [name] , Thank you for taking time to come for your Medicare Wellness Visit. I appreciate your ongoing commitment to your health goals. Please review the following plan we discussed and let me know if I can assist you in the future.   Screening recommendations/referrals: Colonoscopy:  Recommended yearly ophthalmology/optometry visit for glaucoma screening and checkup Recommended yearly dental visit for hygiene and checkup  Vaccinations: Influenza vaccine:  Pneumococcal vaccine:  Tdap vaccine:  Shingles vaccine:        Preventive Care 40-64 Years, Male Preventive care refers to lifestyle choices and visits with your health care provider that can promote health and wellness. What does preventive care include? A yearly physical exam. This is also called an annual well check. Dental exams once or twice a year. Routine eye exams. Ask your health care provider how often you should have your eyes checked. Personal lifestyle choices, including: Daily care of your teeth and gums. Regular physical activity. Eating a healthy diet. Avoiding tobacco and drug use. Limiting alcohol use. Practicing safe sex. Taking low-dose aspirin  every day starting at age 15. What happens during an annual well check? The services and screenings done by your health care provider during your annual well check will depend on your age, overall health, lifestyle risk factors, and family history of disease. Counseling  Your health care provider may ask you questions about your: Alcohol use. Tobacco use. Drug use. Emotional well-being. Home and relationship well-being. Sexual activity. Eating habits. Work and work astronomer. Screening  You may have the following tests or measurements: Height, weight, and BMI. Blood pressure. Lipid and cholesterol levels. These may be checked every 5 years, or more frequently if you are over 58 years old. Skin check. Lung cancer screening. You may have this  screening every year starting at age 6 if you have a 30-pack-year history of smoking and currently smoke or have quit within the past 15 years. Fecal occult blood test (FOBT) of the stool. You may have this test every year starting at age 56. Flexible sigmoidoscopy or colonoscopy. You may have a sigmoidoscopy every 5 years or a colonoscopy every 10 years starting at age 6. Prostate cancer screening. Recommendations will vary depending on your family history and other risks. Hepatitis C blood test. Hepatitis B blood test. Sexually transmitted disease (STD) testing. Diabetes screening. This is done by checking your blood sugar (glucose) after you have not eaten for a while (fasting). You may have this done every 1-3 years. Discuss your test results, treatment options, and if necessary, the need for more tests with your health care provider. Vaccines  Your health care provider may recommend certain vaccines, such as: Influenza vaccine. This is recommended every year. Tetanus, diphtheria, and acellular pertussis (Tdap, Td) vaccine. You may need a Td booster every 10 years. Zoster vaccine. You may need this after age 71. Pneumococcal 13-valent conjugate (PCV13) vaccine. You may need this if you have certain conditions and have not been vaccinated. Pneumococcal polysaccharide (PPSV23) vaccine. You may need one or two doses if you smoke cigarettes or if you have certain conditions. Talk to your health care provider about which screenings and vaccines you need and how often you need them. This information is not intended to replace advice given to you by your health care provider. Make sure you discuss any questions you have with your health care provider. Document Released: 11/05/2015 Document Revised: 06/28/2016 Document Reviewed: 08/10/2015 Elsevier Interactive Patient Education  2017 [employer].  Fall Prevention in the  Home Falls can cause injuries. They can happen to people of all ages. There  are many things you can do to make your home safe and to help prevent falls. What can I do on the outside of my home? Regularly fix the edges of walkways and driveways and fix any cracks. Remove anything that might make you trip as you walk through a door, such as a raised step or threshold. Trim any bushes or trees on the path to your home. Use bright outdoor lighting. Clear any walking paths of anything that might make someone trip, such as rocks or tools. Regularly check to see if handrails are loose or broken. Make sure that both sides of any steps have handrails. Any raised decks and porches should have guardrails on the edges. Have any leaves, snow, or ice cleared regularly. Use sand or salt on walking paths during winter. Clean up any spills in your garage right away. This includes oil or grease spills. What can I do in the bathroom? Use night lights. Install grab bars by the toilet and in the tub and shower. Do not use towel bars as grab bars. Use non-skid mats or decals in the tub or shower. If you need to sit down in the shower, use a plastic, non-slip stool. Keep the floor dry. Clean up any water that spills on the floor as soon as it happens. Remove soap buildup in the tub or shower regularly. Attach bath mats securely with double-sided non-slip rug tape. Do not have throw rugs and other things on the floor that can make you trip. What can I do in the bedroom? Use night lights. Make sure that you have a light by your bed that is easy to reach. Do not use any sheets or blankets that are too big for your bed. They should not hang down onto the floor. Have a firm chair that has side arms. You can use this for support while you get dressed. Do not have throw rugs and other things on the floor that can make you trip. What can I do in the kitchen? Clean up any spills right away. Avoid walking on wet floors. Keep items that you use a lot in easy-to-reach places. If you need to  reach something above you, use a strong step stool that has a grab bar. Keep electrical cords out of the way. Do not use floor polish or wax that makes floors slippery. If you must use wax, use non-skid floor wax. Do not have throw rugs and other things on the floor that can make you trip. What can I do with my stairs? Do not leave any items on the stairs. Make sure that there are handrails on both sides of the stairs and use them. Fix handrails that are broken or loose. Make sure that handrails are as long as the stairways. Check any carpeting to make sure that it is firmly attached to the stairs. Fix any carpet that is loose or worn. Avoid having throw rugs at the top or bottom of the stairs. If you do have throw rugs, attach them to the floor with carpet tape. Make sure that you have a light switch at the top of the stairs and the bottom of the stairs. If you do not have them, ask someone to add them for you. What else can I do to help prevent falls? Wear shoes that: Do not have high heels. Have rubber bottoms. Are comfortable and fit you well. Are closed  at the toe. Do not wear sandals. If you use a stepladder: Make sure that it is fully opened. Do not climb a closed stepladder. Make sure that both sides of the stepladder are locked into place. Ask someone to hold it for you, if possible. Clearly mark and make sure that you can see: Any grab bars or handrails. First and last steps. Where the edge of each step is. Use tools that help you move around (mobility aids) if they are needed. These include: Canes. Walkers. Scooters. Crutches. Turn on the lights when you go into a dark area. Replace any light bulbs as soon as they burn out. Set up your furniture so you have a clear path. Avoid moving your furniture around. If any of your floors are uneven, fix them. If there are any pets around you, be aware of where they are. Review your medicines with your doctor. Some medicines can make  you feel dizzy. This can increase your chance of falling. Ask your doctor what other things that you can do to help prevent falls. This information is not intended to replace advice given to you by your health care provider. Make sure you discuss any questions you have with your health care provider. Document Released: 08/05/2009 Document Revised: 03/16/2016 Document Reviewed: 11/13/2014 Elsevier Interactive Patient Education  2017 [employer].

## 2024-11-04 NOTE — Progress Notes (Signed)
 "  Chief Complaint  Patient presents with   Medicare Wellness     Subjective:   Brandon [name]. is a 54 y.o. male who presents for a Medicare Annual Wellness Visit.  No voiced or noted concerns at this time   Visit info / Clinical Intake: Medicare Wellness Visit Type:: Subsequent Annual Wellness Visit Persons participating in visit and providing information:: patient Medicare Wellness Visit Mode:: In-person (required for WTM) Interpreter Needed?: No Pre-visit prep was completed: no AWV questionnaire completed by patient prior to visit?: no Living arrangements:: with family/others Patient's Overall Health Status Rating: (!) fair Typical amount of pain: none Does pain affect daily life?: no Are you currently prescribed opioids?: no  Dietary Habits and Nutritional Risks How many meals a day?: 2 Eats fruit and vegetables daily?: yes Most meals are obtained by: preparing own meals In the last 2 weeks, have you had any of the following?: none Diabetic:: no  Functional Status Activities of Daily Living (to include ambulation/medication): Independent Ambulation: Independent Medication Administration: Independent Home Management (perform basic housework or laundry): Independent Manage your own finances?: yes Primary transportation is: driving Concerns about vision?: no *vision screening is required for WTM* Concerns about hearing?: no  Fall Screening Falls in the past year?: 0 Number of falls in past year: 0 Was there an injury with Fall?: 0 Fall Risk Category Calculator: 0 Patient Fall Risk Level: Low Fall Risk  Fall Risk Patient at Risk for Falls Due to: No Fall Risks Fall risk Follow up: Falls evaluation completed; Education provided; Falls prevention discussed  Home and Transportation Safety: All rugs have non-skid backing?: N/A, no rugs All stairs or steps have railings?: N/A, no stairs Grab bars in the bathtub or shower?: (!) no Have non-skid surface in  bathtub or shower?: (!) no Good home lighting?: yes Regular seat belt use?: yes Hospital stays in the last year:: no  Cognitive Assessment Difficulty concentrating, remembering, or making decisions? : no Will 6CIT or Mini Cog be Completed: yes What year is it?: 0 points What month is it?: 0 points Give patient an address phrase to remember (5 components): [place] About what time is it?: 0 points Count backwards from 20 to 1: 2 points Say the months of the year in reverse: 2 points Repeat the address phrase from earlier: 2 points 6 CIT Score: 6 points  Advance Directives (For Healthcare) Does Patient Have a Medical Advance Directive?: No Would patient like information on creating a medical advance directive?: No - Patient declined  Reviewed/Updated  Reviewed/Updated: Reviewed All (Medical, Surgical, Family, Medications, Allergies, Care Teams, Patient Goals); Surgical History; Family History; Medications; Allergies; Care Teams; Patient Goals; Medical History    Allergies (verified) Patient has no known allergies.   Current Medications (verified) Outpatient Encounter Medications as of 11/04/2024  Medication Sig   aspirin  EC 325 MG tablet Take 325 mg by mouth daily. Per Dr. Sethi   atorvastatin  (LIPITOR ) 80 MG tablet Take 1 tablet by mouth once daily   citalopram  (CELEXA ) 20 MG tablet Take 1 tablet by mouth once daily   Evolocumab  (REPATHA  SURECLICK) 140 MG/ML SOAJ INJECT 1 PEN SUBCUTANEOUSLY EVERY 14 DAYS   ezetimibe  (ZETIA ) 10 MG tablet Take 1 tablet by mouth once daily   losartan -hydrochlorothiazide (HYZAAR) 100-12.5 MG tablet Take 1 tablet by mouth daily.   metoprolol  succinate (TOPROL -XL) 50 MG 24 hr tablet TAKE 1 TABLET BY MOUTH ONCE DAILY. TAKE WITH OR IMMEDIATLY FOLLOWING A MEAL  tirzepatide  (ZEPBOUND ) 2.5 MG/0.5ML Pen Inject 2.5 mg into the skin once a week.   [DISCONTINUED] aspirin  EC 81 MG tablet Take 1 tablet (81 mg total) by mouth daily.  Swallow whole. (Patient taking differently: Take 325 mg by mouth daily. Swallow whole.)   No facility-administered encounter medications on file as of 11/04/2024.    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, due to reduced neck mobility   Heart attack (HCC)    History of noncompliance with medical treatment    Due to financial considerations   Hyperlipidemia    Hypertension    Stroke [facility])    Tobacco abuse    40 pack years   Past Surgical History:  Procedure Laterality Date   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   Procedure: BYPASS GRAFT CAROTID-SUBCLAVIAN USING HEMASHIELD GOLD GRAFT;  Surgeon: Harvey Carlin BRAVO, MD;  Location: [facility] OR;  Service: Vascular;  Laterality: Left;   COLONOSCOPY N/A 02/18/2024   Procedure: COLONOSCOPY;  Surgeon: San Sandor GAILS, DO;  Location: [facility] ENDOSCOPY;  Service: Gastroenterology;  Laterality: N/A;   COLONOSCOPY WITH PROPOFOL  N/A 09/21/2020   Procedure: COLONOSCOPY WITH PROPOFOL ;  Surgeon: San Sandor GAILS, DO;  Location: [facility] ENDOSCOPY;  Service: Gastroenterology;  Laterality: N/A;   CORONARY ANGIOPLASTY WITH STENT PLACEMENT     HEMOSTASIS CLIP PLACEMENT  09/21/2020   Procedure: HEMOSTASIS CLIP PLACEMENT;  Surgeon: San Sandor GAILS, DO;  Location: [facility] ENDOSCOPY;  Service: Gastroenterology;;   IR ANGIO EXTERNAL CAROTID SEL EXT CAROTID BILAT MOD SED  12/15/2019   IR ANGIO VERTEBRAL SEL VERTEBRAL UNI R MOD SED  12/15/2019   IR ANGIOGRAM EXTREMITY LEFT  12/15/2019   IR  ANGIOGRAM EXTREMITY LEFT  12/15/2019   IR CT HEAD LTD  12/15/2019   IR PERCUTANEOUS ART THROMBECTOMY/INFUSION INTRACRANIAL INC DIAG ANGIO  12/15/2019   POLYPECTOMY  09/21/2020   Procedure: POLYPECTOMY;  Surgeon: San Sandor GAILS, DO;  Location: [facility];  Service: Gastroenterology;;   POLYPECTOMY  02/18/2024   Procedure: POLYPECTOMY, INTESTINE;  Surgeon: San Sandor GAILS, DO;  Location: [facility] ENDOSCOPY;  Service: Gastroenterology;;   RADIOLOGY WITH ANESTHESIA N/A 12/15/2019   Procedure: IR WITH ANESTHESIA;  Surgeon: Dolphus Carrion, MD;  Location: [facility] OR;  Service: Radiology;  Laterality: N/A;   Family History  Problem Relation Age of Onset   Depression Mother 34       Suicide   Heart disease Father 44       Deceased from massive heart-attack   Hyperlipidemia Father    Hypertension Father    COPD Maternal Grandfather    Cancer Paternal Grandmother        Male Cancer   Heart disease Paternal Grandmother    Colon cancer Neg Hx    Pancreatic cancer Neg Hx    Esophageal cancer Neg Hx    Social History   Occupational History   Occupation: Disabled    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 AB, CMA 03-04-24  Vaping Use   Vaping status: Never Used  Substance and Sexual Activity   Alcohol use: No    Comment:  Alcoholism- quit 2002   Drug use: No   Sexual activity: Not Currently   Tobacco Counseling Ready to quit: Not Answered Counseling given: Not Answered Tobacco comments: Smokes 1ppd. AB, CMA 03-04-24  SDOH Screenings   Food Insecurity: No Food Insecurity (11/04/2024)  Housing: Low Risk (05/12/2024)  Transportation Needs: No Transportation Needs (05/12/2024)  Utilities: Not At Risk (05/12/2024)   Depression (PHQ2-9): Medium Risk (11/04/2024)  Financial Resource Strain: Low Risk (09/24/2023)  Physical Activity: Insufficiently Active (11/04/2024)  Social Connections: Moderately Isolated (11/04/2024)  Stress: No Stress Concern Present (11/04/2024)  Tobacco Use: High Risk (11/04/2024)  Health Literacy: Adequate Health Literacy (11/04/2024)   See flowsheets for full screening details  Depression Screen PHQ 2 & 9 Depression Scale- Over the past 2 weeks, how often have you been bothered by any of the following problems? Little interest or pleasure in doing things: 3 Feeling down, depressed, or hopeless (PHQ Adolescent also includes...irritable): 0 PHQ-2 Total Score: 3 Trouble falling or staying asleep, or sleeping too much: 3 Feeling tired or having little energy: 2 Poor appetite or overeating (PHQ Adolescent also includes...weight loss): 1 Feeling bad about yourself - or that you are a failure or have let yourself or your family down: 0 Trouble concentrating on things, such as reading the newspaper or watching television (PHQ Adolescent also includes...like school work): 0 Moving or speaking so slowly that other people could have noticed. Or the opposite - being so fidgety or restless that you have been moving around a lot more than usual: 0 Thoughts that you would be better off dead, or of hurting yourself in some way: 0 PHQ-9 Total Score: 9 If you checked off any problems, how difficult have these problems made it for you to do your work, take care of things at home, or get along with other people?: Not difficult at all  Depression Treatment Depression Interventions/Treatment : Counseling     Goals Addressed             This Visit's Progress    Weight (lb) < 200 lb (90.7 kg)   250 lb (113.4 kg)    Quit smoking             Objective:    Today's Vitals   11/04/24 1511  BP: 102/62  Weight: 250 lb (113.4 kg)  Height: 5' 9 (1.753 m)   Body mass index is 36.92  kg/m.  Hearing/Vision screen Hearing Screening - Comments:: No trouble hearing Vision Screening - Comments:: Wears  readers No specific eye doctor Immunizations and Health Maintenance Health Maintenance  Topic Date Due   Hepatitis B Vaccines 19-59 Average Risk (1 of 3 - 19+ 3-dose series) Never done   Zoster Vaccines- Shingrix (1 of 2) Never done   Lung Cancer Screening  02/28/2023   Medicare Annual Wellness (AWV)  11/04/2025   DTaP/Tdap/Td (2 - Td or Tdap) 04/29/2030   Colonoscopy  02/17/2034   Pneumococcal Vaccine: 50+ Years  Completed   Influenza Vaccine  Completed   Hepatitis C Screening  Completed   HIV Screening  Completed   HPV VACCINES  Aged Out   Meningococcal B Vaccine  Aged Out  COVID-19 Vaccine  Discontinued        Assessment/Plan:  This is a routine wellness examination for [place].  Patient Care Team: Joyce Norleen BROCKS, MD as PCP - General (Family Medicine) Court Dorn PARAS, MD as PCP - Cardiology (Cardiology)  I have personally reviewed and noted the following in the patients chart:   Medical and social history Use of alcohol, tobacco or illicit drugs  Current medications and supplements including opioid prescriptions. Functional ability and status Nutritional status Physical activity Advanced directives List of other physicians Hospitalizations, surgeries, and ER visits in previous 12 months Vitals Screenings to include cognitive, depression, and falls Referrals and appointments  No orders of the defined types were placed in this encounter.  In addition, I have reviewed and discussed with patient certain preventive protocols, quality metrics, and best practice recommendations. A written personalized care plan for preventive services as well as general preventive health recommendations were provided to patient.   Mliss Graff, LPN   [id number]   Return in 1 year (on 11/04/2025).  After Visit Summary: (MyChart) Due to this being a telephonic visit,  the after visit summary with patients personalized plan was offered to patient via MyChart   Nurse Notes:  "

## 2024-11-04 NOTE — Assessment & Plan Note (Addendum)
 History of carotid artery disease with known occluded left ICA and moderate right ICA stenosis.  This will be repeated on an annual basis.  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

## 2024-11-04 NOTE — Patient Instructions (Signed)

## 2024-11-04 NOTE — Progress Notes (Signed)
 "     11/04/2024 Brandon [name]   1970/11/14  [id number]  Primary Physician Joyce Norleen BROCKS, MD Primary Cardiologist: Dorn JINNY Lesches MD [facility], [place], [place]  HPI:  Brandon Steil. is a 54 y.o.  severely overweight married Caucasian male father of 2 who currently is not working because of disability.  He was initially referred by Dr. Joyce because of lower extremity edema which has since resolved after changing jobs.  I last saw him in the office 07/30/2023.SABRA His risk factors include 1-1/2 packs a day of tobacco abuse having smoked 35 years now smoking 1/2 pack/day, as well as treated hypertension and hyperlipidemia. His father had a heart attack at age 77 and died. 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 He was recathed here later found to have in-stent restenosis as well as recently intervened on. I Him 02/24/10 the setting of a non-STEMI revealed revealing a new 75% in-stent restenosis within the RCA stent and a new 99% proximal lesion both of which were intervened on. He's had no problems since.   I have not seen him for close to 2 years.  He did run out of his statin drug for 6 months and had a lipid profile done a month ago revealing total cholesterol 276, LDL 194 and HDL of 37.  He apparently has been put back on a statin drug.  He denies chest pain or shortness of breath.  He does continue to smoke a pack a day however.   Since I saw him little over 1 year ago he did have a stroke 05/13/2024.  He was outside of the TNK window and was treated medically.  His aspirin  was increased from 81-3 25 by Dr. Rosemarie.  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  He continues to smoke 1/2 pack/day.  He is he has dropped 40 pounds on Zepbound  (GLP-1 agonist).  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 AB, CMA 03-04-24  Vaping Use   Vaping status: Never Used  Substance and Sexual Activity   Alcohol use: No    Comment:  Alcoholism- quit 2002   Drug use: No   Sexual activity: Not Currently  Other Topics Concern   Not on file  Social History Narrative            Social Drivers of Health   Tobacco Use: High Risk (11/04/2024)   Patient History    Smoking Tobacco Use: Every Day    Smokeless Tobacco Use: Never    Passive Exposure: Current  Financial Resource Strain: Low Risk (09/24/2023)   Overall Financial Resource Strain (CARDIA)    Difficulty of Paying Living Expenses: Not very  hard  Food Insecurity: No Food Insecurity (05/12/2024)   Epic    Worried About Programme Researcher, Broadcasting/film/video in the Last Year: Never true    Ran Out of Food in the Last Year: Never true  Transportation Needs: No Transportation Needs (05/12/2024)   Epic    Lack of Transportation (Medical): No    Lack of Transportation (Non-Medical): No  Physical Activity: Insufficiently Active (09/24/2023)   Exercise Vital Sign    Days of Exercise per Week: 3 days    Minutes of Exercise per Session: 30 min  Stress: No Stress Concern Present (09/24/2023)   [employer] of Occupational Health - Occupational Stress Questionnaire    Feeling of Stress : Not at all  Social Connections: Moderately Isolated (09/24/2023)   Social Connection and Isolation Panel    Frequency of Communication with Friends  and Family: More than three times a week    Frequency of Social Gatherings with Friends and Family: Once a week    Attends Religious Services: More than 4 times per year    Active Member of [employer] or Organizations: No    Attends Banker Meetings: Never    Marital Status: Widowed  Intimate Partner Violence: Not At Risk (05/12/2024)   Epic    Fear of Current or Ex-Partner: No    Emotionally Abused: No    Physically Abused: No    Sexually Abused: No  Depression (PHQ2-9): High Risk (01/23/2024)   Depression (PHQ2-9)    PHQ-2 Score: 17  Alcohol Screen: Not on file  Housing: Low Risk (05/12/2024)   Epic    Unable to Pay for Housing in the Last Year: No    Number of Times Moved in the Last Year: 0    Homeless in the Last Year: No  Utilities: Not At Risk (05/12/2024)   Epic    Threatened with loss of utilities: No  Health Literacy: Not on file     Review of Systems: General: negative for chills, fever, night sweats or weight changes.  Cardiovascular: negative for chest pain, dyspnea on exertion, edema, orthopnea, palpitations, paroxysmal nocturnal dyspnea or shortness of breath Dermatological: negative for rash Respiratory: negative for cough or wheezing Urologic: negative for hematuria Abdominal: negative for nausea, vomiting, diarrhea, bright red blood per rectum, melena, or hematemesis Neurologic: negative for visual changes, syncope, or dizziness All other systems reviewed and are otherwise negative except as noted above.    Blood pressure 105/63, pulse 60, height 5' 9 (1.753 m), weight 245 lb (111.1 kg), SpO2 94%.  General appearance: alert and no distress Neck: no adenopathy, no carotid bruit, no JVD, supple, symmetrical, trachea midline, and thyroid not enlarged, symmetric, no tenderness/mass/nodules Lungs: clear to auscultation bilaterally Heart: regular rate and rhythm, S1, S2 normal, no murmur, click, rub or gallop Extremities: extremities normal, atraumatic, no  cyanosis or edema Pulses: 2+ and symmetric Skin: Skin color, texture, turgor normal. No rashes or lesions Neurologic: Grossly normal  EKG EKG Interpretation Date/Time:  Tuesday November 04 2024 11:37:56 EST Ventricular Rate:  60 PR Interval:  170 QRS Duration:  102 QT Interval:  432 QTC Calculation: 432 R Axis:   14  Text Interpretation: Normal sinus rhythm Possible Inferior infarct , age undetermined When compared with ECG of 12-May-2024 18:11, PREVIOUS ECG IS PRESENT Confirmed by Court Carrier [phone]) on 11/04/2024 12:09:28 PM    ASSESSMENT AND PLAN:   Hyperlipidemia History of hyperlipidemia on high-dose atorvastatin  and Zetia  with lipid profile performed 05/13/2024 revealing total cholesterol 69,  LDL 60 and HDL 34.  Hypertension History of essential hypertension blood pressure measured today 105/63.  He is on losartan /hydrochlorothiazide and metoprolol .  Morbid obesity (HCC) History of morbid obesity on Zepbound  having lost 40 pounds since I last saw him.  Tobacco abuse Ongoing tobacco abuse and 1/2 pack/day recalcitrant to risk factor modification.  Sleep apnea History of obstructive sleep apnea in the process of obtaining CPAP.  Internal carotid artery occlusion, left History of carotid artery disease with known occluded left ICA and moderate right ICA stenosis.  This will be repeated on an annual basis.  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  Coronary artery disease History of CAD status post inferior wall microinfarction and 2006 treated with stenting of his RCA.  His recath later and found to have in-stent restenosis which was intervened on.  I performed cath on him 02/24/2010 in the setting of non-STEMI revealing a new 75% in-stent restenosis within the RCA stent and a new 99% stenosis both of which were intervened on.  He apparently had intervention in [place]  sometime after that but has  been stable since.     Dorn DOROTHA Lesches MD FACP,FACC,FAHA, FSCAI 11/04/2024 12:20 PM    [1]  Current Meds  Medication Sig   aspirin  EC 325 MG tablet Take 325 mg by mouth daily. Per Dr. Sethi   atorvastatin  (LIPITOR ) 80 MG tablet Take 1 tablet by mouth once daily   citalopram  (CELEXA ) 20 MG tablet Take 1 tablet by mouth once daily   Evolocumab  (REPATHA  SURECLICK) 140 MG/ML SOAJ INJECT 1 PEN SUBCUTANEOUSLY EVERY 14 DAYS   ezetimibe  (ZETIA ) 10 MG tablet Take 1 tablet by mouth once daily   losartan -hydrochlorothiazide (HYZAAR) 100-12.5 MG tablet Take 1 tablet by mouth daily.   metoprolol  succinate (TOPROL -XL) 50 MG 24 hr tablet TAKE 1 TABLET BY MOUTH ONCE DAILY. TAKE WITH OR IMMEDIATLY FOLLOWING A MEAL   tirzepatide  (ZEPBOUND ) 2.5 MG/0.5ML Pen Inject 2.5 mg into the skin once a week.   [DISCONTINUED] aspirin  EC 81 MG tablet Take 1 tablet (81 mg total) by mouth daily. Swallow whole. (Patient taking differently: Take 325 mg by mouth daily. Swallow whole.)  [2] No Known Allergies  "

## 2024-11-04 NOTE — Assessment & Plan Note (Signed)
 History of CAD status post inferior wall microinfarction and 2006 treated with stenting of his RCA.  His recath later and found to have in-stent restenosis which was intervened on.  I performed cath on him 02/24/2010 in the setting of non-STEMI revealing a new 75% in-stent restenosis within the RCA stent and a new 99% stenosis both of which were intervened on.  He apparently had intervention in [place]  sometime after that but has been stable since.

## 2024-11-04 NOTE — Assessment & Plan Note (Signed)
 History of hyperlipidemia on high-dose atorvastatin  and Zetia  with lipid profile performed 05/13/2024 revealing total cholesterol 69, LDL 60 and HDL 34.

## 2024-11-04 NOTE — Assessment & Plan Note (Signed)
 Ongoing tobacco abuse and 1/2 pack/day recalcitrant to risk factor modification.

## 2024-11-04 NOTE — Assessment & Plan Note (Signed)
 History of obstructive sleep apnea in the process of obtaining CPAP.

## 2024-11-04 NOTE — Assessment & Plan Note (Signed)
 History of morbid obesity on Zepbound  having lost 40 pounds since I last saw him.

## 2024-11-21 ENCOUNTER — Telehealth: Payer: Self-pay

## 2024-11-21 NOTE — Telephone Encounter (Unsigned)
 Copied from CRM #[id number]. Topic: Clinical - Prescription Issue >> Nov 20, 2024 12:39 PM Corean SAUNDERS wrote: Reason for CRM: Patient states he attempted to pick up his Zepbound  but was advised by his pharmacy that his insurance no longer covers this medication. Patient is requesting Almarie Ferrari or nurse to call him to advise him on what he can do . >> Nov 21, 2024 11:26 AM Leila BROCKS wrote: Patient [phone] states called yesterday, regarding tirzepatide  (ZEPBOUND ) 2.5 MG/0.5ML Pen, insurance is not covering medication. Patient would like to speak with nurse for help. Patient states is out of medication, was suppose to take a shot yesterday.   [facility] [address] [place] -  [address] [place] [address] [place] [address] Phone: [phone] Fax: [phone]

## 2024-11-24 ENCOUNTER — Ambulatory Visit: Admitting: Primary Care
